# Patient Record
Sex: Female | Born: 1945 | Race: White | Hispanic: No | Smoking: Never smoker
Health system: Southern US, Community
[De-identification: ages and names within clinical notes are randomized; demographics above are authoritative.]

## PROBLEM LIST (undated history)

## (undated) DIAGNOSIS — F419 Anxiety disorder, unspecified: Secondary | ICD-10-CM

## (undated) DIAGNOSIS — G4700 Insomnia, unspecified: Secondary | ICD-10-CM

## (undated) DIAGNOSIS — G629 Polyneuropathy, unspecified: Secondary | ICD-10-CM

## (undated) DIAGNOSIS — E079 Disorder of thyroid, unspecified: Secondary | ICD-10-CM

## (undated) DIAGNOSIS — G8929 Other chronic pain: Secondary | ICD-10-CM

## (undated) DIAGNOSIS — M549 Dorsalgia, unspecified: Secondary | ICD-10-CM

## (undated) DIAGNOSIS — H269 Unspecified cataract: Secondary | ICD-10-CM

## (undated) DIAGNOSIS — E119 Type 2 diabetes mellitus without complications: Secondary | ICD-10-CM

## (undated) DIAGNOSIS — I2510 Atherosclerotic heart disease of native coronary artery without angina pectoris: Secondary | ICD-10-CM

## (undated) DIAGNOSIS — B029 Zoster without complications: Secondary | ICD-10-CM

## (undated) DIAGNOSIS — I10 Essential (primary) hypertension: Secondary | ICD-10-CM

## (undated) DIAGNOSIS — E785 Hyperlipidemia, unspecified: Secondary | ICD-10-CM

## (undated) DIAGNOSIS — K259 Gastric ulcer, unspecified as acute or chronic, without hemorrhage or perforation: Secondary | ICD-10-CM

## (undated) DIAGNOSIS — K219 Gastro-esophageal reflux disease without esophagitis: Secondary | ICD-10-CM

## (undated) HISTORY — DX: Polyneuropathy, unspecified: G62.9

## (undated) HISTORY — DX: Other chronic pain: G89.29

## (undated) HISTORY — DX: Insomnia, unspecified: G47.00

## (undated) HISTORY — DX: Gastro-esophageal reflux disease without esophagitis: K21.9

## (undated) HISTORY — DX: Disorder of thyroid, unspecified: E07.9

## (undated) HISTORY — DX: Essential (primary) hypertension: I10

## (undated) HISTORY — DX: Zoster without complications: B02.9

## (undated) HISTORY — PX: TOTAL ABDOMINAL HYSTERECTOMY: SHX209

## (undated) HISTORY — DX: Gastric ulcer, unspecified as acute or chronic, without hemorrhage or perforation: K25.9

## (undated) HISTORY — DX: Hyperlipidemia, unspecified: E78.5

## (undated) HISTORY — DX: Dorsalgia, unspecified: M54.9

## (undated) HISTORY — DX: Anxiety disorder, unspecified: F41.9

## (undated) HISTORY — DX: Unspecified cataract: H26.9

## (undated) HISTORY — DX: Atherosclerotic heart disease of native coronary artery without angina pectoris: I25.10

## (undated) HISTORY — DX: Type 2 diabetes mellitus without complications: E11.9

---

## 2010-09-22 ENCOUNTER — Encounter: Payer: Self-pay | Admitting: Cardiology

## 2010-09-22 ENCOUNTER — Ambulatory Visit (INDEPENDENT_AMBULATORY_CARE_PROVIDER_SITE_OTHER): Payer: Medicare Other | Admitting: Cardiology

## 2010-09-22 DIAGNOSIS — I10 Essential (primary) hypertension: Secondary | ICD-10-CM

## 2010-09-22 DIAGNOSIS — E1159 Type 2 diabetes mellitus with other circulatory complications: Secondary | ICD-10-CM | POA: Insufficient documentation

## 2010-09-22 DIAGNOSIS — F419 Anxiety disorder, unspecified: Secondary | ICD-10-CM | POA: Insufficient documentation

## 2010-09-22 DIAGNOSIS — I152 Hypertension secondary to endocrine disorders: Secondary | ICD-10-CM | POA: Insufficient documentation

## 2010-09-22 DIAGNOSIS — I2510 Atherosclerotic heart disease of native coronary artery without angina pectoris: Secondary | ICD-10-CM

## 2010-09-22 NOTE — Progress Notes (Signed)
HPI The patient presents as a new patient. I do have some records. These are somewhat difficult to interpret as I see a catheterization on 4008 with a stent to the right coronary artery Taxus 2.5 x 16 mm. However, the catheterization report it looks like the preprocedure angiogram suggested only a 15% stenosis in the RCA. I do see 2008 stress test.  She is now relocating here. She has no acute cardiovascular symptoms. She does not describe chest pressure, neck or arm discomfort though she will occasionally have some sharp pains and infrequently takes a nitroglycerin. This seems to be at night. It has been a chronic pattern. She is mostly limited by joint pains. She does have some episodes of shortness of breath and uses a nebulizer and MDI. She feels weak and she feels tired. Her biggest complaint has been anxiety and son are related to the stress of caring for her elderly mother. She is not describing PND or orthopnea.  No Known Allergies  Current Outpatient Prescriptions  Medication Sig Dispense Refill  . acetaminophen (TYLENOL) 325 MG tablet Take 650 mg by mouth every 6 (six) hours as needed.        Marland Kitchen. aspirin 81 MG tablet Take 81 mg by mouth daily.        . budesonide-formoterol (SYMBICORT) 160-4.5 MCG/ACT inhaler Inhale 2 puffs into the lungs 2 (two) times daily.        . cloNIDine (CATAPRES) 0.1 MG tablet Take 0.1 mg by mouth 2 (two) times daily.        Marland Kitchen. diltiazem (MATZIM LA) 360 MG 24 hr tablet Take 360 mg by mouth daily.        Marland Kitchen. glipiZIDE (GLUCOTROL) 5 MG tablet Take 5 mg by mouth 2 (two) times daily before a meal.        . hydrochlorothiazide 25 MG tablet Take 25 mg by mouth daily.        Maximino Greenland. IPRATROPIUM-ALBUTEROL IN Inhale into the lungs once a week.        . isosorbide mononitrate (IMDUR) 30 MG 24 hr tablet Take 30 mg by mouth daily.        Marland Kitchen. levothyroxine (SYNTHROID, LEVOTHROID) 100 MCG tablet Take 100 mcg by mouth daily.        . metFORMIN (GLUCOPHAGE) 1000 MG tablet Take 1,000 mg by  mouth. 1 and 1/2 tab po bid       . misoprostol (CYTOTEC) 200 MCG tablet Take 200 mcg by mouth 2 (two) times daily.        . niacin (NIASPAN) 1000 MG CR tablet Take 1,000 mg by mouth at bedtime.        . nitroGLYCERIN (NITROLINGUAL) 0.4 MG/SPRAY spray Place 1 spray under the tongue every 5 (five) minutes as needed.        . Omega-3 Fatty Acids (FISH OIL) 1000 MG CAPS Take by mouth daily.        Marland Kitchen. omeprazole (PRILOSEC) 20 MG capsule Take 20 mg by mouth daily.        . potassium chloride (K-DUR,KLOR-CON) 10 MEQ tablet Take 20 mEq by mouth daily.         Past Medical History  Diagnosis Date  . Asthma   . Thyroid disease   . Neuropathy   . Hypertension   . Chronic back pain   . Shingles   . GERD (gastroesophageal reflux disease)   . Anxiety     depression  . Migraine   . Insomnia   .  Coronary artery disease     Taxus stent to RCA 2008 2.5 x 16, non obstructive 15% proximal right coronary artery, patent curcumflex LAD, preserved LV    Past Surgical History  Procedure Date  . Total abdominal hysterectomy     Family History  Problem Relation Age of Onset  . Stroke Mother 27  . Coronary artery disease Brother 29  . Coronary artery disease Brother 54    History   Social History  . Marital Status: Married    Spouse Name: N/A    Number of Children: N/A  . Years of Education: N/A   Occupational History  . None    Social History Main Topics  . Smoking status: Never Smoker   . Smokeless tobacco: Not on file  . Alcohol Use: Not on file  . Drug Use: Not on file  . Sexually Active: Not on file   Other Topics Concern  . Not on file   Social History Narrative   Lives with sister taking care of her mother.  Lived in [place] but moving here.    ROS:  As stated in the HPI and negative for all other systems.  PHYSICAL EXAM BP 132/68  Pulse 99  Resp 16  Ht  (1.702 m)  Wt 189 lb (85.73 kg)  BMI 29.60 kg/m2 GENERAL:  Well appearing HEENT:  Pupils equal round and  reactive, fundi not visualized, oral mucosa unremarkable, edentulous NECK:  No jugular venous distention, waveform within normal limits, carotid upstroke brisk and symmetric, no bruits, no thyromegaly LYMPHATICS:  No cervical, inguinal adenopathy LUNGS:  Clear to auscultation bilaterally BACK:  No CVA tenderness CHEST:  Unremarkable HEART:  PMI not displaced or sustained,S1 and S2 within normal limits, no S3, no S4, no clicks, no rubs, no murmurs ABD:  Flat, positive bowel sounds normal in frequency in pitch, no bruits, no rebound, no guarding, no midline pulsatile mass, no hepatomegaly, no splenomegaly EXT:  2 plus pulses throughout, no edema, no cyanosis no clubbing, varicose veins SKIN:  No rashes no nodules NEURO:  Cranial nerves II through XII grossly intact, motor grossly intact throughout PSYCH:  Cognitively intact, oriented to person place and time  ASSESSMENT AND PLAN

## 2010-09-22 NOTE — Assessment & Plan Note (Signed)
At this point I don't suspect any anginal symptoms. No further cardiovascular testing will be planned. She will continue with risk reduction.

## 2010-09-22 NOTE — Patient Instructions (Signed)
Follow up in 1 year with Dr Hochrein.  You will receive a letter in the mail 2 months before you are due.  Please call us when you receive this letter to schedule your follow up appointment.  The current medical regimen is effective;  continue present plan and medications.  

## 2010-09-22 NOTE — Assessment & Plan Note (Signed)
The blood pressure is at target. No change in medications is indicated. We will continue with therapeutic lifestyle changes (TLC).  

## 2010-09-22 NOTE — Assessment & Plan Note (Signed)
Unfortunately this is a significant complaint the patient. She would like to have a very limited prescription of anxiolytics.  However, I will defer to Dr. Modesto Charon.  I have asked the patient to call him to discuss this.  It sounds like a reasonable request but I will defer to Dr. Nash Dimmer judgement.

## 2010-12-24 ENCOUNTER — Encounter: Payer: Self-pay | Admitting: Cardiology

## 2010-12-27 ENCOUNTER — Ambulatory Visit: Payer: Medicare Other | Admitting: Nurse Practitioner

## 2011-01-26 ENCOUNTER — Ambulatory Visit (INDEPENDENT_AMBULATORY_CARE_PROVIDER_SITE_OTHER): Payer: Medicare Other | Admitting: Cardiology

## 2011-01-26 ENCOUNTER — Encounter: Payer: Self-pay | Admitting: Cardiology

## 2011-01-26 DIAGNOSIS — E785 Hyperlipidemia, unspecified: Secondary | ICD-10-CM | POA: Insufficient documentation

## 2011-01-26 DIAGNOSIS — F419 Anxiety disorder, unspecified: Secondary | ICD-10-CM

## 2011-01-26 DIAGNOSIS — I10 Essential (primary) hypertension: Secondary | ICD-10-CM

## 2011-01-26 DIAGNOSIS — E1169 Type 2 diabetes mellitus with other specified complication: Secondary | ICD-10-CM | POA: Insufficient documentation

## 2011-01-26 DIAGNOSIS — F411 Generalized anxiety disorder: Secondary | ICD-10-CM

## 2011-01-26 DIAGNOSIS — I2510 Atherosclerotic heart disease of native coronary artery without angina pectoris: Secondary | ICD-10-CM

## 2011-01-26 NOTE — Assessment & Plan Note (Signed)
Given her new chest pain and past history stress testing is indicated. She would not be able to walk on a treadmill so she will have a [employer].

## 2011-01-26 NOTE — Assessment & Plan Note (Signed)
The blood pressure is at target. No change in medications is indicated. We will continue with therapeutic lifestyle changes (TLC).  

## 2011-01-26 NOTE — Assessment & Plan Note (Signed)
In October her LDL was 99 with an HDL of 66. She will continue the meds as listed.

## 2011-01-26 NOTE — Progress Notes (Signed)
HPI The patient presents for follow up of CAD.  The last appt was her first appt with me.  At that time she was doing well. However, she has started to have some chest pain. These are sporadic. They happen at rest. She's not sure there like any previous chest pains. She might take a nitroglycerin but she hasn't done this for about a week. She doesn't describe associated symptoms such as nausea vomiting or diaphoresis. She's not noticing any palpitations, presyncope or syncope. They are mild in intensity. They may last for seconds to a minute. She doesn't describe any radiation to her arms or neck.  She denies any new shortness of breath, PND or orthopnea.  No Known Allergies  Current Outpatient Prescriptions  Medication Sig Dispense Refill  . acetaminophen (TYLENOL) 325 MG tablet Take 650 mg by mouth every 6 (six) hours as needed.        Marland Kitchen. aspirin 325 MG tablet Take 325 mg by mouth daily.        . budesonide-formoterol (SYMBICORT) 160-4.5 MCG/ACT inhaler Inhale 2 puffs into the lungs 2 (two) times daily.        . cholecalciferol (VITAMIN D) 400 UNITS TABS Take by mouth. VITAMIN D3 daily       . cloNIDine (CATAPRES) 0.1 MG tablet Take 0.1 mg by mouth 2 (two) times daily.        Marland Kitchen. diltiazem (MATZIM LA) 360 MG 24 hr tablet Take 360 mg by mouth daily.        Marland Kitchen. glipiZIDE (GLUCOTROL) 5 MG tablet Take 5 mg by mouth 2 (two) times daily before a meal.        . hydrochlorothiazide 25 MG tablet Take 25 mg by mouth daily.        Maximino Greenland. IPRATROPIUM-ALBUTEROL IN Inhale into the lungs once a week.        . isosorbide mononitrate (IMDUR) 30 MG 24 hr tablet Take 30 mg by mouth daily.        Marland Kitchen. KLOR-CON 10 10 MEQ CR tablet 1 tab daily      . levothyroxine (SYNTHROID, LEVOTHROID) 100 MCG tablet Take 100 mcg by mouth daily.        . metFORMIN (GLUCOPHAGE) 1000 MG tablet Take 1,000 mg by mouth. 1 and 1/2 tab po bid       . misoprostol (CYTOTEC) 200 MCG tablet 1 tab twice a day      . niacin (NIASPAN) 1000 MG CR tablet  Take 1,000 mg by mouth at bedtime.        . nitroGLYCERIN (NITROLINGUAL) 0.4 MG/SPRAY spray Place 1 spray under the tongue every 5 (five) minutes as needed.        . Omega-3 Fatty Acids (FISH OIL) 1000 MG CAPS Take by mouth daily.        Marland Kitchen. omeprazole (PRILOSEC) 20 MG capsule Take 20 mg by mouth daily.        . potassium chloride (K-DUR,KLOR-CON) 10 MEQ tablet Take 20 mEq by mouth daily.       Marland Kitchen. ZETIA 10 MG tablet 1 tab daily        Past Medical History  Diagnosis Date  . Asthma   . Thyroid disease   . Neuropathy   . Hypertension   . Chronic back pain   . Shingles   . GERD (gastroesophageal reflux disease)   . Anxiety     depression  . Migraine   . Insomnia   . Coronary artery disease  Taxus stent to RCA 2008 2.5 x 16, non obstructive 15% proximal right coronary artery, patent curcumflex LAD, preserved LV    ROS:  Insomnia, back pain. Otherwise as stated in the HPI and negative for all other systems.  PHYSICAL EXAM BP 128/74  Pulse 84  Ht 5\' 7"  (1.702 m)  Wt 195 lb (88.451 kg)  BMI 30.54 kg/m2 GENERAL:  Well appearing HEENT:  Pupils equal round and reactive, fundi not visualized, oral mucosa unremarkable, edentulous NECK:  No jugular venous distention, waveform within normal limits, carotid upstroke brisk and symmetric, no bruits, no thyromegaly LYMPHATICS:  No cervical, inguinal adenopathy LUNGS:  Clear to auscultation bilaterally BACK:  No CVA tenderness CHEST:  Unremarkable HEART:  PMI not displaced or sustained,S1 and S2 within normal limits, no S3, no S4, no clicks, no rubs, no murmurs ABD:  Flat, positive bowel sounds normal in frequency in pitch, no bruits, no rebound, no guarding, no midline pulsatile mass, no hepatomegaly, no splenomegaly EXT:  2 plus pulses throughout, no edema, no cyanosis no clubbing, varicose veins SKIN:  No rashes no nodules NEURO:  Cranial nerves II through XII grossly intact, motor grossly intact throughout PSYCH:  Cognitively intact,  oriented to person place and time  EKG:  Sinus rhythm, rate 84, axis within normal limits, intervals within normal limits, no acute ST-T wave changes.  ASSESSMENT AND PLAN

## 2011-01-26 NOTE — Assessment & Plan Note (Signed)
I will defer to Dr. Orvan July.

## 2011-02-02 DIAGNOSIS — R079 Chest pain, unspecified: Secondary | ICD-10-CM

## 2011-02-03 ENCOUNTER — Encounter: Payer: Self-pay | Admitting: Cardiology

## 2011-03-23 ENCOUNTER — Ambulatory Visit (INDEPENDENT_AMBULATORY_CARE_PROVIDER_SITE_OTHER): Payer: Medicare Other | Admitting: Cardiology

## 2011-03-23 ENCOUNTER — Encounter: Payer: Self-pay | Admitting: Cardiology

## 2011-03-23 DIAGNOSIS — I10 Essential (primary) hypertension: Secondary | ICD-10-CM

## 2011-03-23 DIAGNOSIS — E785 Hyperlipidemia, unspecified: Secondary | ICD-10-CM

## 2011-03-23 DIAGNOSIS — I2510 Atherosclerotic heart disease of native coronary artery without angina pectoris: Secondary | ICD-10-CM

## 2011-03-23 MED ORDER — ISOSORBIDE MONONITRATE ER 60 MG PO TB24: 60.0000 mg | ORAL_TABLET | Freq: Every day | ORAL | Status: DC

## 2011-03-23 NOTE — Progress Notes (Signed)
HPI The patient presents for follow up of CAD.  At her first appt with me she described some chest pain.  We sent her for stress perfusion study which demonstrated an EF of 71% and questionable inferior defect with questionable artifact vs infarct with mild periinfarct ischemia.  Since that time she has had no change in her symptoms.  She does get some discomfort that is moderate.  It might last for 20 minutes.  She cannot bring it on with activity.  She does activities such as wash clothes without bringing this on.  She does not describe associated symptoms and she thinks that this is a stable pattern.  She denies any new SOB, PND or orthopnea.    No Known Allergies  Current Outpatient Prescriptions  Medication Sig Dispense Refill  . acetaminophen (TYLENOL) 325 MG tablet Take 650 mg by mouth every 6 (six) hours as needed.        Marland Kitchen aspirin 325 MG tablet Take 325 mg by mouth daily.        . budesonide-formoterol (SYMBICORT) 160-4.5 MCG/ACT inhaler Inhale 2 puffs into the lungs 2 (two) times daily.        . Chlorpheniramine Maleate (ALLERGY PO) Take by mouth as needed.      . cholecalciferol (VITAMIN D) 400 UNITS TABS Take by mouth. VITAMIN D3 daily       . cloNIDine (CATAPRES) 0.1 MG tablet Take 0.1 mg by mouth 2 (two) times daily.        Marland Kitchen diltiazem (MATZIM LA) 360 MG 24 hr tablet Take 360 mg by mouth daily.        Marland Kitchen glipiZIDE (GLUCOTROL) 5 MG tablet Take 5 mg by mouth 2 (two) times daily before a meal. And 1 po qhs      . hydrochlorothiazide 25 MG tablet Take 25 mg by mouth daily.        Maximino Greenland IN Inhale into the lungs once a week.        . isosorbide mononitrate (IMDUR) 30 MG 24 hr tablet Take 30 mg by mouth daily.        Marland Kitchen KLOR-CON 10 10 MEQ CR tablet 1 tab daily      . levothyroxine (SYNTHROID, LEVOTHROID) 125 MCG tablet Take 125 mcg by mouth daily.      . metFORMIN (GLUCOPHAGE) 1000 MG tablet Take 1,000 mg by mouth. 1 and 1/2 tab po bid       . misoprostol (CYTOTEC) 200  MCG tablet 1 tab twice a day      . niacin (NIASPAN) 1000 MG CR tablet Take 2,000 mg by mouth at bedtime.       . nitroGLYCERIN (NITROLINGUAL) 0.4 MG/SPRAY spray Place 1 spray under the tongue every 5 (five) minutes as needed.        . Omega-3 Fatty Acids (FISH OIL) 1000 MG CAPS Take by mouth daily.        Marland Kitchen omeprazole (PRILOSEC) 20 MG capsule Take 20 mg by mouth daily.        . potassium chloride (K-DUR,KLOR-CON) 10 MEQ tablet Take 10 mEq by mouth daily.       Marland Kitchen ZETIA 10 MG tablet 1 tab daily        Past Medical History  Diagnosis Date  . Asthma   . Thyroid disease   . Neuropathy   . Hypertension   . Chronic back pain   . Shingles   . GERD (gastroesophageal reflux disease)   . Anxiety  depression  . Migraine   . Insomnia   . Coronary artery disease     Taxus stent to RCA 2008 2.5 x 16, non obstructive 15% proximal right coronary artery, patent curcumflex LAD, preserved LV    ROS:  Insomnia, back pain. Otherwise as stated in the HPI and negative for all other systems.  PHYSICAL EXAM BP 126/86  Pulse 100  Resp 16  Ht 5\' 7"  (1.702 m)  Wt 190 lb (86.183 kg)  BMI 29.76 kg/m2 GENERAL:  Well appearing HEENT:  Pupils equal round and reactive, fundi not visualized, oral mucosa unremarkable, edentulous NECK:  No jugular venous distention, waveform within normal limits, carotid upstroke brisk and symmetric, no bruits, no thyromegaly LYMPHATICS:  No cervical, inguinal adenopathy LUNGS:  Clear to auscultation bilaterally BACK:  No CVA tenderness CHEST:  Unremarkable HEART:  PMI not displaced or sustained,S1 and S2 within normal limits, no S3, no S4, no clicks, no rubs, no murmurs ABD:  Flat, positive bowel sounds normal in frequency in pitch, no bruits, no rebound, no guarding, no midline pulsatile mass, no hepatomegaly, no splenomegaly EXT:  2 plus pulses throughout, no edema, no cyanosis no clubbing, varicose veins SKIN:  No rashes no nodules NEURO:  Cranial nerves II through  XII grossly intact, motor grossly intact throughout PSYCH:  Cognitively intact, oriented to person place and time   ASSESSMENT AND PLAN

## 2011-03-23 NOTE — Assessment & Plan Note (Signed)
I reviewed her most recent lipids.  At this point no change in therapy is indicated.

## 2011-03-23 NOTE — Patient Instructions (Addendum)
Increase Isosorbide to 60 mg a day  Continue all other medications as listed   Follow up in 1 year with Dr Antoine Poche.  You will receive a letter in the mail 2 months before you are due.  Please call us when you receive this letter to schedule your follow up appointment.

## 2011-03-23 NOTE — Assessment & Plan Note (Signed)
This was a low risk study result.  Her symptoms are somewhat atypical and stable.  I will increase her Imdur to 60 mg daily.  For now I don't think that invasive study (cath is indicated). I might suggest GI referral if her symptoms continue.

## 2011-03-23 NOTE — Assessment & Plan Note (Signed)
The blood pressure is at target. No change in medications is indicated. We will continue with therapeutic lifestyle changes (TLC).  

## 2011-09-12 ENCOUNTER — Other Ambulatory Visit: Payer: Self-pay | Admitting: Family Medicine

## 2011-09-12 DIAGNOSIS — M545 Low back pain, unspecified: Secondary | ICD-10-CM

## 2011-09-14 ENCOUNTER — Other Ambulatory Visit: Payer: Medicare Other

## 2012-05-08 ENCOUNTER — Other Ambulatory Visit: Payer: Self-pay | Admitting: *Deleted

## 2012-05-08 DIAGNOSIS — Z780 Asymptomatic menopausal state: Secondary | ICD-10-CM

## 2012-05-21 ENCOUNTER — Encounter: Payer: Self-pay | Admitting: Family Medicine

## 2012-05-21 ENCOUNTER — Ambulatory Visit (INDEPENDENT_AMBULATORY_CARE_PROVIDER_SITE_OTHER): Payer: Medicare Other | Admitting: Family Medicine

## 2012-05-21 VITALS — BP 170/91 | HR 66 | Temp 98.5°F | Ht 66.0 in | Wt 186.6 lb

## 2012-05-21 DIAGNOSIS — E669 Obesity, unspecified: Secondary | ICD-10-CM

## 2012-05-21 DIAGNOSIS — E039 Hypothyroidism, unspecified: Secondary | ICD-10-CM

## 2012-05-21 DIAGNOSIS — I10 Essential (primary) hypertension: Secondary | ICD-10-CM

## 2012-05-21 DIAGNOSIS — F419 Anxiety disorder, unspecified: Secondary | ICD-10-CM

## 2012-05-21 DIAGNOSIS — J449 Chronic obstructive pulmonary disease, unspecified: Secondary | ICD-10-CM | POA: Insufficient documentation

## 2012-05-21 DIAGNOSIS — E785 Hyperlipidemia, unspecified: Secondary | ICD-10-CM

## 2012-05-21 DIAGNOSIS — J4489 Other specified chronic obstructive pulmonary disease: Secondary | ICD-10-CM

## 2012-05-21 DIAGNOSIS — J45909 Unspecified asthma, uncomplicated: Secondary | ICD-10-CM | POA: Insufficient documentation

## 2012-05-21 DIAGNOSIS — K219 Gastro-esophageal reflux disease without esophagitis: Secondary | ICD-10-CM

## 2012-05-21 DIAGNOSIS — F411 Generalized anxiety disorder: Secondary | ICD-10-CM

## 2012-05-21 DIAGNOSIS — I2510 Atherosclerotic heart disease of native coronary artery without angina pectoris: Secondary | ICD-10-CM

## 2012-05-21 DIAGNOSIS — E119 Type 2 diabetes mellitus without complications: Secondary | ICD-10-CM

## 2012-05-21 MED ORDER — MISOPROSTOL 200 MCG PO TABS: 200.0000 ug | ORAL_TABLET | Freq: Two times a day (BID) | ORAL | Status: DC

## 2012-05-21 NOTE — Progress Notes (Signed)
Subjective:     Patient ID: Denise Gardner, female   DOB: 07-17-1945, 67 y.o.   MRN: [id number]  HPI Type II or unspecified type diabetes mellitus without mention of complication, not stated as uncontrolled - Plan: POCT glycosylated hemoglobin (Hb A1C), POCT UA - Microalbumin  Unspecified asthma  Hypertension  Anxiety  Coronary artery disease  Hyperlipidemia - Plan: COMPLETE METABOLIC PANEL WITH GFR, NMR Lipoprofile with Lipids  COPD (chronic obstructive pulmonary disease)  GERD (gastroesophageal reflux disease)  Obesity, unspecified  Unspecified hypothyroidism - Plan: Thyroid Panel With TSH   Her sugars are running 150s. Occasional blurred vision. Last exam of her eyes has been a long time. She was set up an eye exam appointment with her eye doctor. Asthma has been stable. Was given her are tender and cold winter. But it's much better and. Her blood pressure has always been stable she is quite surprised that his bit high today. She was getting very impatient because of the long wait due to the data entry avoid new computer system. In view of her coronary disease there's been no chest pain shortness of breath palpitations or pedal edema. In view of her COPD she has not had any wheezing. Her GERD has been stable. Her obesity she has lost a couple pounds but that's as much as it is. She is trying to make lifestyle changes in her diet. But it's very hard and she doesn't exercise as much because of the cold weather.  Past Medical History  Diagnosis Date  . Asthma   . Thyroid disease   . Neuropathy   . Hypertension   . Chronic back pain   . Shingles   . GERD (gastroesophageal reflux disease)   . Anxiety     depression  . Migraine   . Insomnia   . Coronary artery disease     Taxus stent to RCA 2008 2.5 x 16, non obstructive 15% proximal right coronary artery, patent curcumflex LAD, preserved LV   Past Surgical History  Procedure Laterality Date  . Total abdominal  hysterectomy     History   Social History  . Marital Status: Married    Spouse Name: N/A    Number of Children: N/A  . Years of Education: N/A   Occupational History  . retired     Medical laboratory scientific officer    Social History Main Topics  . Smoking status: Never Smoker   . Smokeless tobacco: Not on file  . Alcohol Use: No  . Drug Use: No  . Sexually Active: Not on file   Other Topics Concern  . Not on file   Social History Narrative   Lives with sister taking care of her mother.  Lives here   Family History  Problem Relation Age of Onset  . Stroke Mother 45  . Coronary artery disease Brother 75  . Coronary artery disease Brother 50   Current Outpatient Prescriptions on File Prior to Visit  Medication Sig Dispense Refill  . acetaminophen (TYLENOL) 325 MG tablet Take 650 mg by mouth every 6 (six) hours as needed.        Marland Kitchen aspirin 325 MG tablet Take 325 mg by mouth daily.        . cholecalciferol (VITAMIN D) 400 UNITS TABS Take by mouth. VITAMIN D3 daily       . cloNIDine (CATAPRES) 0.1 MG tablet Take 0.1 mg by mouth 2 (two) times daily.        Marland Kitchen diltiazem (MATZIM LA) 360 MG  24 hr tablet Take 360 mg by mouth daily.        Marland Kitchen glipiZIDE (GLUCOTROL) 5 MG tablet Take 5 mg by mouth 2 (two) times daily before a meal. And 1 po qhs      . hydrochlorothiazide 25 MG tablet Take 25 mg by mouth daily.        Maximino Greenland IN Inhale into the lungs once a week.        . isosorbide mononitrate (IMDUR) 60 MG 24 hr tablet Take 1 tablet (60 mg total) by mouth daily.  90 tablet  3  . KLOR-CON 10 10 MEQ CR tablet 1 tab daily      . levothyroxine (SYNTHROID, LEVOTHROID) 125 MCG tablet Take 125 mcg by mouth daily.      . metFORMIN (GLUCOPHAGE) 1000 MG tablet Take 1,000 mg by mouth. 1 and 1/2 tab po bid       . niacin (NIASPAN) 1000 MG CR tablet Take 2,000 mg by mouth at bedtime.       . Omega-3 Fatty Acids (FISH OIL) 1000 MG CAPS Take by mouth daily.        Marland Kitchen omeprazole (PRILOSEC) 20 MG capsule  Take 20 mg by mouth daily.        . potassium chloride (K-DUR,KLOR-CON) 10 MEQ tablet Take 10 mEq by mouth daily.       . budesonide-formoterol (SYMBICORT) 160-4.5 MCG/ACT inhaler Inhale 2 puffs into the lungs 2 (two) times daily.        . Chlorpheniramine Maleate (ALLERGY PO) Take by mouth as needed.      . nitroGLYCERIN (NITROLINGUAL) 0.4 MG/SPRAY spray Place 1 spray under the tongue every 5 (five) minutes as needed.        Marland Kitchen ZETIA 10 MG tablet 1 tab daily       No current facility-administered medications on file prior to visit.   Allergies  Allergen Reactions  . Zetia (Ezetimibe)     mylagia   Immunization History  Administered Date(s) Administered  . Pneumococcal Polysaccharide 10/22/2010   Prior to Admission medications   Medication Sig Start Date End Date Taking? Authorizing Provider  ACCU-CHEK AVIVA PLUS test strip  04/02/12  Yes Historical Provider, MD  ACCU-CHEK FASTCLIX LANCETS MISC  04/02/12  Yes Historical Provider, MD  acetaminophen (TYLENOL) 325 MG tablet Take 650 mg by mouth every 6 (six) hours as needed.     Yes Historical Provider, MD  aspirin 325 MG tablet Take 325 mg by mouth daily.     Yes Historical Provider, MD  cholecalciferol (VITAMIN D) 400 UNITS TABS Take by mouth. VITAMIN D3 daily    Yes Historical Provider, MD  cloNIDine (CATAPRES) 0.1 MG tablet Take 0.1 mg by mouth 2 (two) times daily.     Yes Historical Provider, MD  diltiazem (MATZIM LA) 360 MG 24 hr tablet Take 360 mg by mouth daily.     Yes Historical Provider, MD  fenofibrate 54 MG tablet  05/15/12  Yes Historical Provider, MD  glipiZIDE (GLUCOTROL) 5 MG tablet Take 5 mg by mouth 2 (two) times daily before a meal. And 1 po qhs   Yes Historical Provider, MD  hydrochlorothiazide 25 MG tablet Take 25 mg by mouth daily.     Yes Historical Provider, MD  IPRATROPIUM-ALBUTEROL IN Inhale into the lungs once a week.     Yes Historical Provider, MD  isosorbide mononitrate (IMDUR) 60 MG 24 hr tablet Take 1 tablet (60  mg total) by mouth daily. 03/23/11  Yes Rollene Rotunda, MD  JANUVIA 100 MG tablet  03/06/12  Yes Historical Provider, MD  KLOR-CON 10 10 MEQ CR tablet 1 tab daily 01/05/11  Yes Historical Provider, MD  levothyroxine (SYNTHROID, LEVOTHROID) 125 MCG tablet Take 125 mcg by mouth daily.   Yes Historical Provider, MD  metFORMIN (GLUCOPHAGE) 1000 MG tablet Take 1,000 mg by mouth. 1 and 1/2 tab po bid    Yes Historical Provider, MD  misoprostol (CYTOTEC) 200 MCG tablet Take 1 tablet (200 mcg total) by mouth 2 (two) times daily. 1 tab twice a day 05/21/12  Yes Ileana Ladd, MD  niacin (NIASPAN) 1000 MG CR tablet Take 2,000 mg by mouth at bedtime.    Yes Historical Provider, MD  Omega-3 Fatty Acids (FISH OIL) 1000 MG CAPS Take by mouth daily.     Yes Historical Provider, MD  omeprazole (PRILOSEC) 20 MG capsule Take 20 mg by mouth daily.     Yes Historical Provider, MD  potassium chloride (K-DUR,KLOR-CON) 10 MEQ tablet Take 10 mEq by mouth daily.    Yes Historical Provider, MD  budesonide-formoterol (SYMBICORT) 160-4.5 MCG/ACT inhaler Inhale 2 puffs into the lungs 2 (two) times daily.      Historical Provider, MD  Chlorpheniramine Maleate (ALLERGY PO) Take by mouth as needed.    Historical Provider, MD  nitroGLYCERIN (NITROLINGUAL) 0.4 MG/SPRAY spray Place 1 spray under the tongue every 5 (five) minutes as needed.      Historical Provider, MD  ZETIA 10 MG tablet 1 tab daily 01/26/11   Historical Provider, MD    Review of Systems Review of other symptomatologies in the review of system was negative    Objective:   Physical Exam On examination she appeared in good health and spirits. Well developed, well nourished. Overweight actually obese Vital signs as documented. BP 170/91  Pulse 66  Temp(Src) 98.5 F (36.9 C) (Oral)  Ht 5\' 6"  (1.676 m)  Wt 186 lb 9.6 oz (84.641 kg)  BMI 30.13 kg/m2  Skin warm and dry and without overt rashes. There is a tiny pinhead wart on the heel of the left foot Head & Neck  without JVD. Lungs clear.  Heart exam notable for regular rhythm, normal sounds and absence of murmurs, rubs or gallops. Abdomen unremarkable and without evidence of organomegaly, masses, or abdominal aortic enlargement.  Breast exam: not performed. Gyn Exam: Not performed. External Genitalia: Vagina:: Cervix: Uterus: Adnexae: R/V: Extremities nonedematous.    Assessment:     Type II or unspecified type diabetes mellitus without mention of complication, not stated as uncontrolled  Unspecified asthma  Hypertension  Anxiety  Coronary artery disease  Hyperlipidemia  COPD (chronic obstructive pulmonary disease)  GERD (gastroesophageal reflux disease)  Obesity, unspecified  Left foot plantar wart. Reviewed her diagnosis with her and the changes needed to improve her health status i.e. diet and exercise    Plan:                                      Medications prescribed  Diet and Exercise discussed with patient. For nutrition information, I recommended books: Eat to Live by Dr Monico Hoar. Prevent and Reverse Heart Disease by Dr Suzzette Righter.  Exercise recommendations are:  If unable to walk, then the patient can exercise in a chair 3 times a day. By flapping arms like a bird gently and raising legs outwards to the front.  If  ambulatory, the patient can go for walks for 30 minutes 3 times a week. Then increase the intensity and duration as tolerated. Goal is to try to attain exercise frequency to 5 times a week. Best to perform resistance exercises 2 days a week and cardio type exercises 3 days per week.   Meds reviewed and labs ordered for the near future.  Edgerrin Correia P. Modesto Charon, M.D.

## 2012-05-30 ENCOUNTER — Ambulatory Visit (INDEPENDENT_AMBULATORY_CARE_PROVIDER_SITE_OTHER): Payer: Medicare Other | Admitting: Pharmacist

## 2012-05-30 ENCOUNTER — Other Ambulatory Visit: Payer: Medicare Other

## 2012-05-30 ENCOUNTER — Ambulatory Visit (INDEPENDENT_AMBULATORY_CARE_PROVIDER_SITE_OTHER): Payer: Medicare Other

## 2012-05-30 ENCOUNTER — Telehealth: Payer: Self-pay | Admitting: Pharmacist

## 2012-05-30 ENCOUNTER — Other Ambulatory Visit: Payer: Self-pay | Admitting: *Deleted

## 2012-05-30 ENCOUNTER — Encounter: Payer: Self-pay | Admitting: Pharmacist

## 2012-05-30 ENCOUNTER — Ambulatory Visit: Payer: Medicare Other | Admitting: *Deleted

## 2012-05-30 VITALS — BP 144/77 | Ht 65.0 in | Wt 184.0 lb

## 2012-05-30 VITALS — BP 144/77 | HR 99

## 2012-05-30 DIAGNOSIS — E785 Hyperlipidemia, unspecified: Secondary | ICD-10-CM

## 2012-05-30 DIAGNOSIS — I10 Essential (primary) hypertension: Secondary | ICD-10-CM

## 2012-05-30 DIAGNOSIS — M8580 Other specified disorders of bone density and structure, unspecified site: Secondary | ICD-10-CM

## 2012-05-30 DIAGNOSIS — Z0130 Encounter for examination of blood pressure without abnormal findings: Secondary | ICD-10-CM

## 2012-05-30 DIAGNOSIS — M899 Disorder of bone, unspecified: Secondary | ICD-10-CM

## 2012-05-30 DIAGNOSIS — Z780 Asymptomatic menopausal state: Secondary | ICD-10-CM

## 2012-05-30 DIAGNOSIS — E039 Hypothyroidism, unspecified: Secondary | ICD-10-CM

## 2012-05-30 LAB — COMPREHENSIVE METABOLIC PANEL
ALT: 16 U/L (ref 0–35)
AST: 19 U/L (ref 0–37)
Albumin: 4.5 g/dL (ref 3.5–5.2)
Alkaline Phosphatase: 48 U/L (ref 39–117)
BUN: 15 mg/dL (ref 6–23)
CO2: 27 mEq/L (ref 19–32)
Calcium: 10.7 mg/dL — ABNORMAL HIGH (ref 8.4–10.5)
Chloride: 96 mEq/L (ref 96–112)
Creat: 0.94 mg/dL (ref 0.50–1.10)
Glucose, Bld: 145 mg/dL — ABNORMAL HIGH (ref 70–99)
Potassium: 3.6 mEq/L (ref 3.5–5.3)
Sodium: 138 mEq/L (ref 135–145)
Total Bilirubin: 0.5 mg/dL (ref 0.3–1.2)
Total Protein: 7.4 g/dL (ref 6.0–8.3)

## 2012-05-30 LAB — THYROID PANEL WITH TSH
Free Thyroxine Index: 4.2 — ABNORMAL HIGH (ref 1.0–3.9)
T3 Uptake: 46.5 % — ABNORMAL HIGH (ref 22.5–37.0)
T4, Total: 9.1 ug/dL (ref 5.0–12.5)
TSH: 2.054 u[IU]/mL (ref 0.350–4.500)

## 2012-05-30 MED ORDER — RALOXIFENE HCL 60 MG PO TABS: 60.0000 mg | ORAL_TABLET | Freq: Every day | ORAL | Status: DC

## 2012-05-30 NOTE — Telephone Encounter (Signed)
Please review patient's records and make recommendations as needed

## 2012-05-30 NOTE — Patient Instructions (Signed)

## 2012-05-30 NOTE — Telephone Encounter (Signed)
Needs office visit. To assess her insomnia

## 2012-05-30 NOTE — Progress Notes (Signed)
Patient ID: Keyia Moretto, female   DOB: 1945-10-01, 67 y.o.   MRN: [id number] [facility] Current Height: Height: 5\' 5"  (165.1 cm)      Max Lifetime Height:  5'7.5" Current Weight: Weight: 184 lb (83.462 kg)       BP: BP: 144/77 mmHg        HPI: Does pt already have a diagnosis of:  Osteopenia?  No Osteoporosis?  No  Back Pain?  Yes       Kyphosis?  Yes Prior fracture?  No Med(s) for Osteoporosis/Osteopenia:  Vitamin D qd Med(s) previously tried for Osteoporosis/Osteopenia:  none                                                             PMH: Age at menopause:  67yo (surgical) Hysterectomy?  Yes Oophorectomy?  Yes HRT? Yes - Former.  Type/duration: pt cannot recall Steroid Use?  Yes - Current.  Type/duration: inhaled steroids for asthma Thyroid med?  Yes History of cancer? No History of digestive disorders (ie Crohn's)?  Yes - GERD Current or previous eating disorders?  No Last Vitamin D Result:  56 (01/2012) Last GFR Result:  72 (01/2012)   FH/SH: Family history of osteoporosis?  Yes -sister Parent with history of hip fracture?  No Family history of breast cancer?  Yes -sister Exercise?  No Caffeine?  Yes - 2-3 cups coffee per day Smoking?  No Alcohol?  No    Calcium Assessment  **per patient lactose intolerant** Calcium Intake  # of servings/day  Calcium mg  Milk (8 oz) 0  x  300  = 0  Yogurt (8 oz) 0 x  400 = 0  Cheese (1 oz) 0 x  200 = 0  Non dairy sources   250 mg  Ca supplement 0 = 0   Estimated calcium intake per day 250mg     DEXA Results Date of Test T-Score for AP Spine L1-L4 T-Score for Left Neck of Hip T-Score for Right Neck of Hip  05/30/2012 0.6 -2.1 -1.4                  FRAX 10 year estimate: Total FX risk:  12%  (consider medication if >/= 20%) Hip FX risk:  2.1%  (consider medication if >/= 3%)  Assessment: Osteopenia with family h/o osteoporosis   Recommendations: Start evista 60mg  po daily Increase calcium intake to 1200mg   daily through diet or calcium supplementation Weight bearing exercise - as able Educate on fall preventtion - counseling and educational materials provided Recheck DEXA:  2 years  Time spent counseling patient:  

## 2012-05-30 NOTE — Patient Instructions (Signed)
F/u as recommended

## 2012-05-30 NOTE — Progress Notes (Signed)
Rck BP per Dr. Modesto Charon

## 2012-05-31 LAB — NMR LIPOPROFILE WITH LIPIDS
Cholesterol, Total: 166 mg/dL (ref <200)
HDL Particle Number: 33.3 umol/L (ref >=30.5)
HDL Size: 10.2 nm (ref >=9.2)
HDL-C: 69 mg/dL (ref >=40)
LDL (calc): 88 mg/dL (ref <100)
LDL Particle Number: 650 nmol/L (ref <1000)
LDL Size: 20.9 nm (ref >20.5)
LP-IR Score: <25 (ref <=45)
Large HDL-P: 12.3 umol/L (ref >=4.8)
Large VLDL-P: 0.8 nmol/L (ref <=2.7)
Small LDL Particle Number: 174 nmol/L (ref <=527)
Triglycerides: 43 mg/dL (ref <150)
VLDL Size: 42 nm (ref <=46.6)

## 2012-05-31 NOTE — Progress Notes (Signed)
Quick Note:  Lab result at goal. No change in Medications for now. ______ 

## 2012-06-05 NOTE — Telephone Encounter (Signed)
Pt aware that needs ov and she stated will  call next week for ov

## 2012-06-13 ENCOUNTER — Other Ambulatory Visit: Payer: Self-pay

## 2012-06-13 MED ORDER — HYDROCHLOROTHIAZIDE 25 MG PO TABS: 25.0000 mg | ORAL_TABLET | Freq: Every day | ORAL | Status: DC

## 2012-06-13 MED ORDER — POTASSIUM CHLORIDE CRYS ER 10 MEQ PO TBCR: 10.0000 meq | EXTENDED_RELEASE_TABLET | Freq: Every day | ORAL | Status: DC

## 2012-06-19 ENCOUNTER — Other Ambulatory Visit: Payer: Self-pay

## 2012-06-19 MED ORDER — FENOFIBRATE 54 MG PO TABS: 54.0000 mg | ORAL_TABLET | Freq: Every day | ORAL | Status: DC

## 2012-06-30 ENCOUNTER — Other Ambulatory Visit: Payer: Self-pay | Admitting: Family Medicine

## 2012-07-25 ENCOUNTER — Other Ambulatory Visit: Payer: Self-pay | Admitting: Family Medicine

## 2012-08-07 ENCOUNTER — Other Ambulatory Visit: Payer: Self-pay | Admitting: Family Medicine

## 2012-08-12 ENCOUNTER — Other Ambulatory Visit: Payer: Self-pay | Admitting: Family Medicine

## 2012-09-05 ENCOUNTER — Ambulatory Visit (INDEPENDENT_AMBULATORY_CARE_PROVIDER_SITE_OTHER): Payer: Medicare Other | Admitting: Family Medicine

## 2012-09-05 ENCOUNTER — Ambulatory Visit (INDEPENDENT_AMBULATORY_CARE_PROVIDER_SITE_OTHER): Payer: Medicare Other

## 2012-09-05 ENCOUNTER — Encounter: Payer: Self-pay | Admitting: Family Medicine

## 2012-09-05 VITALS — BP 154/85 | HR 96 | Temp 97.1°F | Ht 65.0 in | Wt 182.0 lb

## 2012-09-05 DIAGNOSIS — M25579 Pain in unspecified ankle and joints of unspecified foot: Secondary | ICD-10-CM

## 2012-09-05 DIAGNOSIS — M19079 Primary osteoarthritis, unspecified ankle and foot: Secondary | ICD-10-CM

## 2012-09-05 DIAGNOSIS — E119 Type 2 diabetes mellitus without complications: Secondary | ICD-10-CM

## 2012-09-05 DIAGNOSIS — Z0000 Encounter for general adult medical examination without abnormal findings: Secondary | ICD-10-CM

## 2012-09-05 DIAGNOSIS — M25571 Pain in right ankle and joints of right foot: Secondary | ICD-10-CM

## 2012-09-05 DIAGNOSIS — Z780 Asymptomatic menopausal state: Secondary | ICD-10-CM

## 2012-09-05 DIAGNOSIS — N951 Menopausal and female climacteric states: Secondary | ICD-10-CM

## 2012-09-05 DIAGNOSIS — Z23 Encounter for immunization: Secondary | ICD-10-CM

## 2012-09-05 LAB — POCT GLYCOSYLATED HEMOGLOBIN (HGB A1C): Hemoglobin A1C: 6.8

## 2012-09-05 LAB — POCT UA - MICROALBUMIN: Microalbumin Ur, POC: 20 mg/L

## 2012-09-05 MED ORDER — RALOXIFENE HCL 60 MG PO TABS: 60.0000 mg | ORAL_TABLET | Freq: Every day | ORAL | Status: DC

## 2012-09-05 MED ORDER — MELOXICAM 7.5 MG PO TABS: 7.5000 mg | ORAL_TABLET | Freq: Every day | ORAL | Status: DC

## 2012-09-05 NOTE — Progress Notes (Signed)
Patient ID: Denise Gardner, female   DOB: [birth year], 67 y.o.   MRN: [id number] SUBJECTIVE: CC: Chief Complaint  Patient presents with  . Back Pain    Radicular pain into right leg and down through to right ankle.  Also c/o right ankle swelling.  . Follow-up    f/u on chronic medical conditions      HPI: Patient is here for follow up of Diabetes Mellitus: Symptoms of DM: Denies Nocturia ,Denies Urinary Frequency , denies Blurred vision ,deniesDizziness,denies.Dysuria,denies paresthesias, denies extremity pain or ulcers.Marland Kitchendenies chest pain. has had an annual eye exam. do check the feet. Does check CBGs. Average CBG:100 but fluctuates 1 episodes of hypoglycemia.went to 40s Does have an emergency hypoglycemic plan. admits toCompliance with medications. Denies Problems with medications.  Right ankle giving her a problem. Hurts at night after walking all day.  Past Medical History  Diagnosis Date  . Asthma   . Thyroid disease   . Neuropathy   . Hypertension   . Chronic back pain   . Shingles   . GERD (gastroesophageal reflux disease)   . Anxiety     depression  . Migraine   . Insomnia   . Coronary artery disease     Taxus stent to RCA 2008 2.5 x 16, non obstructive 15% proximal right coronary artery, patent curcumflex LAD, preserved LV   Past Surgical History  Procedure Laterality Date  . Total abdominal hysterectomy     History   Social History  . Marital Status: Married    Spouse Name: N/A    Number of Children: N/A  . Years of Education: N/A   Occupational History  . retired     Medical laboratory scientific officer    Social History Main Topics  . Smoking status: Never Smoker   . Smokeless tobacco: Not on file  . Alcohol Use: No  . Drug Use: No  . Sexually Active: Not on file   Other Topics Concern  . Not on file   Social History Narrative   Lives with sister taking care of her mother.  Lives here   Family History  Problem Relation Age of Onset  . Stroke Mother 59  . Coronary  artery disease Brother 1  . Coronary artery disease Brother 42   Current Outpatient Prescriptions on File Prior to Visit  Medication Sig Dispense Refill  . ACCU-CHEK AVIVA PLUS test strip       . ACCU-CHEK FASTCLIX LANCETS MISC       . acetaminophen (TYLENOL) 325 MG tablet Take 650 mg by mouth every 6 (six) hours as needed.        Marland Kitchen aspirin 325 MG tablet Take 325 mg by mouth daily.        . beclomethasone (QVAR) 40 MCG/ACT inhaler Inhale 2 puffs into the lungs 2 (two) times daily.      . Chlorpheniramine Maleate (ALLERGY PO) Take by mouth as needed.      . cholecalciferol (VITAMIN D) 400 UNITS TABS Take by mouth. VITAMIN D3 daily       . cloNIDine (CATAPRES) 0.1 MG tablet Take 0.1 mg by mouth 2 (two) times daily.        Marland Kitchen diltiazem (MATZIM LA) 360 MG 24 hr tablet Take 360 mg by mouth daily.        . fenofibrate 54 MG tablet Take 1 tablet (54 mg total) by mouth daily.  30 tablet  1  . glipiZIDE (GLUCOTROL) 5 MG tablet Take 5 mg by mouth 2 (two)  times daily before a meal. And 1 po qhs      . hydrochlorothiazide (HYDRODIURIL) 25 MG tablet TAKE 1 TABLET BY MOUTH DAILY  30 tablet  2  . IPRATROPIUM-ALBUTEROL IN Inhale into the lungs once a week.       . isosorbide mononitrate (IMDUR) 60 MG 24 hr tablet Take 1 tablet (60 mg total) by mouth daily.  90 tablet  3  . JANUVIA 100 MG tablet       . KLOR-CON 10 10 MEQ tablet TAKE 1 TABLET BY MOUTH DAILY  30 tablet  2  . levothyroxine (SYNTHROID, LEVOTHROID) 125 MCG tablet TAKE ONE TABLET BY MOUTH DAILY  90 tablet  2  . metFORMIN (GLUCOPHAGE) 1000 MG tablet TAKE 1 AND 1/2 TABLETS BY MOUTH TWICE DAILY  270 tablet  0  . misoprostol (CYTOTEC) 200 MCG tablet Take 1 tablet (200 mcg total) by mouth 2 (two) times daily. 1 tab twice a day  180 tablet  3  . niacin (NIASPAN) 1000 MG CR tablet Take 2,000 mg by mouth at bedtime.       . nitroGLYCERIN (NITROLINGUAL) 0.4 MG/SPRAY spray Place 1 spray under the tongue every 5 (five) minutes as needed.        . Omega-3  Fatty Acids (FISH OIL) 1000 MG CAPS Take by mouth daily.        Marland Kitchen omeprazole (PRILOSEC) 20 MG capsule Take 20 mg by mouth daily.        . raloxifene (EVISTA) 60 MG tablet Take one po qd  30 tablet  0  . [DISCONTINUED] potassium chloride (K-DUR,KLOR-CON) 10 MEQ tablet Take 1 tablet (10 mEq total) by mouth daily.  30 tablet  1   No current facility-administered medications on file prior to visit.   Allergies  Allergen Reactions  . Crestor (Rosuvastatin)     Myalgias   . Zetia (Ezetimibe)     mylagia   Immunization History  Administered Date(s) Administered  . Pneumococcal Polysaccharide 10/22/2010   Prior to Admission medications   Medication Sig Start Date End Date Taking? Authorizing Provider  ACCU-CHEK AVIVA PLUS test strip  04/02/12   Historical Provider, MD  ACCU-CHEK FASTCLIX LANCETS MISC  04/02/12   Historical Provider, MD  acetaminophen (TYLENOL) 325 MG tablet Take 650 mg by mouth every 6 (six) hours as needed.      Historical Provider, MD  aspirin 325 MG tablet Take 325 mg by mouth daily.      Historical Provider, MD  beclomethasone (QVAR) 40 MCG/ACT inhaler Inhale 2 puffs into the lungs 2 (two) times daily. 11/30/11   Historical Provider, MD  Chlorpheniramine Maleate (ALLERGY PO) Take by mouth as needed.    Historical Provider, MD  cholecalciferol (VITAMIN D) 400 UNITS TABS Take by mouth. VITAMIN D3 daily     Historical Provider, MD  cloNIDine (CATAPRES) 0.1 MG tablet Take 0.1 mg by mouth 2 (two) times daily.      Historical Provider, MD  diltiazem (MATZIM LA) 360 MG 24 hr tablet Take 360 mg by mouth daily.      Historical Provider, MD  fenofibrate 54 MG tablet Take 1 tablet (54 mg total) by mouth daily. 06/19/12   Ileana Ladd, MD  glipiZIDE (GLUCOTROL) 5 MG tablet Take 5 mg by mouth 2 (two) times daily before a meal. And 1 po qhs    Historical Provider, MD  hydrochlorothiazide (HYDRODIURIL) 25 MG tablet TAKE 1 TABLET BY MOUTH DAILY 08/07/12   Ileana Ladd, MD  IPRATROPIUM-ALBUTEROL IN Inhale into the lungs once a week.     Historical Provider, MD  isosorbide mononitrate (IMDUR) 60 MG 24 hr tablet Take 1 tablet (60 mg total) by mouth daily. 03/23/11   Rollene Rotunda, MD  JANUVIA 100 MG tablet  03/06/12   Historical Provider, MD  KLOR-CON 10 10 MEQ tablet TAKE 1 TABLET BY MOUTH DAILY 08/07/12   Ileana Ladd, MD  levothyroxine (SYNTHROID, LEVOTHROID) 125 MCG tablet TAKE ONE TABLET BY MOUTH DAILY 08/12/12   Ileana Ladd, MD  metFORMIN (GLUCOPHAGE) 1000 MG tablet TAKE 1 AND 1/2 TABLETS BY MOUTH TWICE DAILY 06/30/12   Ileana Ladd, MD  misoprostol (CYTOTEC) 200 MCG tablet Take 1 tablet (200 mcg total) by mouth 2 (two) times daily. 1 tab twice a day 05/21/12   Ileana Ladd, MD  niacin (NIASPAN) 1000 MG CR tablet Take 2,000 mg by mouth at bedtime.     Historical Provider, MD  nitroGLYCERIN (NITROLINGUAL) 0.4 MG/SPRAY spray Place 1 spray under the tongue every 5 (five) minutes as needed.      Historical Provider, MD  Omega-3 Fatty Acids (FISH OIL) 1000 MG CAPS Take by mouth daily.      Historical Provider, MD  omeprazole (PRILOSEC) 20 MG capsule Take 20 mg by mouth daily.      Historical Provider, MD  raloxifene (EVISTA) 60 MG tablet Take one po qd 07/25/12   Ileana Ladd, MD     ROS: As above in the HPI. All other systems are stable or negative.  OBJECTIVE: APPEARANCE:  Patient in no acute distress.The patient appeared well nourished and normally developed. Acyanotic. Waist: VITAL SIGNS:BP 154/85  Pulse 96  Temp(Src) 97.1 F (36.2 C) (Oral)  Ht 5\' 5"  (1.651 m)  Wt 182 lb (82.555 kg)  BMI 30.29 kg/m2 Obese WF  SKIN: warm and  Dry without overt rashes, tattoos and scars  HEAD and Neck: without JVD, Head and scalp: normal Eyes:No scleral icterus. Fundi normal, eye movements normal. Ears: Auricle normal, canal normal, Tympanic membranes normal, insufflation normal. Nose: normal Throat: normal Neck & thyroid: normal  CHEST & LUNGS: Chest  wall: normal Lungs: Clear  CVS: Reveals the PMI to be normally located. Regular rhythm, First and Second Heart sounds are normal,  absence of murmurs, rubs or gallops. Peripheral vasculature: Radial pulses: normal Dorsal pedis pulses: normal Posterior pulses: normal  ABDOMEN:  Appearance: obese Benign, no organomegaly, no masses, no Abdominal Aortic enlargement. No Guarding , no rebound. No Bruits. Bowel sounds: normal  RECTAL: N/A GU: N/A  EXTREMETIES: nonedematous. Both Femoral and Pedal pulses are normal.  MUSCULOSKELETAL:  Spine: normal Joints:right ankle lateral malleolar area is  Swollen.  NEUROLOGIC: oriented to time,place and person; nonfocal. Strength is normal Sensory is normal Reflexes are normal  ASSESSMENT: Diabetes - Plan: POCT glycosylated hemoglobin (Hb A1C), COMPLETE METABOLIC PANEL WITH GFR, NMR Lipoprofile with Lipids, POCT UA - Microalbumin, Microalbumin, urine  Need for Tdap vaccination - Plan: Tdap vaccine greater than or equal to 7yo IM  Routine health maintenance - Plan: Ambulatory referral to Gastroenterology  Arthritis of ankle - Plan: meloxicam (MOBIC) 7.5 MG tablet  Pain in joint, ankle and foot, right - Plan: DG Ankle Complete Right  Menopause - Plan: raloxifene (EVISTA) 60 MG tablet  PLAN: Orders Placed This Encounter  Procedures  . DG Ankle Complete Right    Standing Status: Future     Number of Occurrences: 1     Standing Expiration Date: 11/05/2013  Order Specific Question:  Reason for Exam (SYMPTOM  OR DIAGNOSIS REQUIRED)    Answer:  right ankle pain and  swelling    Order Specific Question:  Preferred imaging location?    Answer:  Internal  . Tdap vaccine greater than or equal to 7yo IM  . COMPLETE METABOLIC PANEL WITH GFR  . NMR Lipoprofile with Lipids  . Microalbumin, urine  . Ambulatory referral to Gastroenterology    Referral Priority:  Routine    Referral Type:  Consultation    Referral Reason:  Specialty Services  Required    Requested Specialty:  Gastroenterology    Number of Visits Requested:  1  . POCT glycosylated hemoglobin (Hb A1C)  . POCT UA - Microalbumin    [facility] reading (PRIMARY) by  Dr. Modesto Charon: Herby Abraham not reviewed by me. Await official reading.  Meds ordered this encounter  Medications  . raloxifene (EVISTA) 60 MG tablet    Sig: Take 1 tablet (60 mg total) by mouth daily.    Dispense:  90 tablet    Refill:  0  . meloxicam (MOBIC) 7.5 MG tablet    Sig: Take 1 tablet (7.5 mg total) by mouth daily.    Dispense:  30 tablet    Refill:  1   Return in about 6 weeks (around 10/17/2012) for Recheck medical problems. To recheck ankle and BMP  Haydan Mansouri P. Modesto Charon, M.D.

## 2012-09-05 NOTE — Patient Instructions (Addendum)
Schedule appt for mammogram at mobile unit  Tetanus, Diphtheria, Pertussis (Tdap) Vaccine What You Need to Know WHY GET VACCINATED? Tetanus, diphtheria and pertussis can be very serious diseases, even for adolescents and adults. Tdap vaccine can protect [place] from these diseases. TETANUS (Lockjaw) causes painful muscle tightening and stiffness, usually all over the body.  It can lead to tightening of muscles in the head and neck so you can't open your mouth, swallow, or sometimes even breathe. Tetanus kills about 1 out of 5 people who are infected. DIPHTHERIA can cause a thick coating to form in the back of the throat.  It can lead to breathing problems, paralysis, heart failure, and death. PERTUSSIS (Whooping Cough) causes severe coughing spells, which can cause difficulty breathing, vomiting and disturbed sleep.  It can also lead to weight loss, incontinence, and rib fractures. Up to 2 in 100 adolescents and 5 in 100 adults with pertussis are hospitalized or have complications, which could include pneumonia and death. These diseases are caused by bacteria. Diphtheria and pertussis are spread from person to person through coughing or sneezing. Tetanus enters the body through cuts, scratches, or wounds. Before vaccines, the [place] saw as many as 200,000 cases a year of diphtheria and pertussis, and hundreds of cases of tetanus. Since vaccination began, tetanus and diphtheria have dropped by about 99% and pertussis by about 80%. TDAP VACCINE Tdap vaccine can protect adolescents and adults from tetanus, diphtheria, and pertussis. One dose of Tdap is routinely given at age 34 or 41. People who did not get Tdap at that age should get it as soon as possible. Tdap is especially important for health care professionals and anyone having close contact with a baby younger than 12 months. Pregnant women should get a dose of Tdap during every pregnancy, to protect the newborn from pertussis. Infants are  most at risk for severe, life-threatening complications from pertussis. A similar vaccine, called Td, protects from tetanus and diphtheria, but not pertussis. A Td booster should be given every 10 years. Tdap may be given as one of these boosters if you have not already gotten a dose. Tdap may also be given after a severe cut or burn to prevent tetanus infection. Your doctor can give you more information. Tdap may safely be given at the same time as other vaccines. SOME PEOPLE SHOULD NOT GET THIS VACCINE  If you ever had a life-threatening allergic reaction after a dose of any tetanus, diphtheria, or pertussis containing vaccine, OR if you have a severe allergy to any part of this vaccine, you should not get Tdap. Tell your doctor if you have any severe allergies.  If you had a coma, or long or multiple seizures within 7 days after a childhood dose of DTP or DTaP, you should not get Tdap, unless a cause other than the vaccine was found. You can still get Td.  Talk to your doctor if you:  have epilepsy or another nervous system problem,  had severe pain or swelling after any vaccine containing diphtheria, tetanus or pertussis,  ever had Guillain-Barr Syndrome (GBS),  aren't feeling well on the day the shot is scheduled. RISKS OF A VACCINE REACTION With any medicine, including vaccines, there is a chance of side effects. These are usually mild and go away on their own, but serious reactions are also possible. Brief fainting spells can follow a vaccination, leading to injuries from falling. Sitting or lying down for about 15 minutes can help prevent these. Tell your  doctor if you feel dizzy or light-headed, or have vision changes or ringing in the ears. Mild problems following Tdap (Did not interfere with activities)  Pain where the shot was given (about 3 in 4 adolescents or 2 in 3 adults)  Redness or swelling where the shot was given (about 1 person in 5)  Mild fever of at least 100.19F  (up to about 1 in 25 adolescents or 1 in 100 adults)  Headache (about 3 or 4 people in 10)  Tiredness (about 1 person in 3 or 4)  Nausea, vomiting, diarrhea, stomach ache (up to 1 in 4 adolescents or 1 in 10 adults)  Chills, body aches, sore joints, rash, swollen glands (uncommon) Moderate problems following Tdap (Interfered with activities, but did not require medical attention)  Pain where the shot was given (about 1 in 5 adolescents or 1 in 100 adults)  Redness or swelling where the shot was given (up to about 1 in 16 adolescents or 1 in 25 adults)  Fever over 102F (about 1 in 100 adolescents or 1 in 250 adults)  Headache (about 3 in 20 adolescents or 1 in 10 adults)  Nausea, vomiting, diarrhea, stomach ache (up to 1 or 3 people in 100)  Swelling of the entire arm where the shot was given (up to about 3 in 100). Severe problems following Tdap (Unable to perform usual activities, required medical attention)  Swelling, severe pain, bleeding and redness in the arm where the shot was given (rare). A severe allergic reaction could occur after any vaccine (estimated less than 1 in a million doses). WHAT IF THERE IS A SERIOUS REACTION? What should I look for?  Look for anything that concerns you, such as signs of a severe allergic reaction, very high fever, or behavior changes. Signs of a severe allergic reaction can include hives, swelling of the face and throat, difficulty breathing, a fast heartbeat, dizziness, and weakness. These would start a few minutes to a few hours after the vaccination. What should I do?  If you think it is a severe allergic reaction or other emergency that can't wait, call 9-1-1 or get the person to the nearest hospital. Otherwise, call your doctor.  Afterward, the reaction should be reported to the "Vaccine Adverse Event Reporting System" (VAERS). Your doctor might file this report, or you can do it yourself through the VAERS web site at www.vaers.LAgents.nohhs.gov,  or by calling 1-667-644-2015. VAERS is only for reporting reactions. They do not give medical advice.  THE NATIONAL VACCINE INJURY COMPENSATION PROGRAM The National Vaccine Injury Compensation Program (VICP) is a federal program that was created to compensate people who may have been injured by certain vaccines. Persons who believe they may have been injured by a vaccine can learn about the program and about filing a claim by calling 1-828-113-8874 or visiting the VICP website at SpiritualWord.atwww.hrsa.gov/vaccinecompensation. HOW CAN I LEARN MORE?  Ask your doctor.  Call your local or state health department.  Contact the Centers for Disease Control and Prevention (CDC):  Call 469-737-39491-313-317-4924 or visit CDC's website at PicCapture.uywww.cdc.gov/vaccines. CDC Tdap Vaccine VIS (07/07/11) Document Released: 08/16/2011 Document Revised: 11/09/2011 Document Reviewed: 08/16/2011 ExitCare Patient Information 2014 [place].

## 2012-09-06 LAB — COMPLETE METABOLIC PANEL WITH GFR
ALT: 18 U/L (ref 0–35)
AST: 19 U/L (ref 0–37)
Albumin: 4.3 g/dL (ref 3.5–5.2)
Alkaline Phosphatase: 40 U/L (ref 39–117)
BUN: 12 mg/dL (ref 6–23)
CO2: 30 mEq/L (ref 19–32)
Calcium: 9.9 mg/dL (ref 8.4–10.5)
Chloride: 98 mEq/L (ref 96–112)
Creat: 0.82 mg/dL (ref 0.50–1.10)
GFR, Est African American: 84 mL/min
GFR, Est Non African American: 73 mL/min
Glucose, Bld: 141 mg/dL — ABNORMAL HIGH (ref 70–99)
Potassium: 3.4 mEq/L — ABNORMAL LOW (ref 3.5–5.3)
Sodium: 137 mEq/L (ref 135–145)
Total Bilirubin: 0.4 mg/dL (ref 0.3–1.2)
Total Protein: 6.8 g/dL (ref 6.0–8.3)

## 2012-09-06 LAB — NMR LIPOPROFILE WITH LIPIDS
Cholesterol, Total: 136 mg/dL (ref <200)
HDL Particle Number: 35.8 umol/L (ref >=30.5)
HDL Size: 10.2 nm (ref >=9.2)
HDL-C: 59 mg/dL (ref >=40)
LDL (calc): 69 mg/dL (ref <100)
LDL Particle Number: 389 nmol/L (ref <1000)
LDL Size: 21.3 nm (ref >20.5)
LP-IR Score: <25 (ref <=45)
Large HDL-P: 11.5 umol/L (ref >=4.8)
Large VLDL-P: 1.2 nmol/L (ref <=2.7)
Small LDL Particle Number: <90 nmol/L (ref <=527)
Triglycerides: 38 mg/dL (ref <150)
VLDL Size: 45.2 nm (ref <=46.6)

## 2012-09-06 LAB — MICROALBUMIN, URINE: Microalb, Ur: 0.5 mg/dL (ref 0.00–1.89)

## 2012-09-06 NOTE — Progress Notes (Signed)
Quick Note:  Call patient. Xray normal. No change in plan. ______

## 2012-09-09 ENCOUNTER — Other Ambulatory Visit: Payer: Self-pay | Admitting: Family Medicine

## 2012-09-09 DIAGNOSIS — R899 Unspecified abnormal finding in specimens from other organs, systems and tissues: Secondary | ICD-10-CM

## 2012-09-09 MED ORDER — POTASSIUM CHLORIDE ER 20 MEQ PO TBCR: EXTENDED_RELEASE_TABLET | ORAL | Status: DC

## 2012-09-09 NOTE — Progress Notes (Signed)
Quick Note:  Lab result close to goal. change in Medications : Increase the potassium to 20 mEq daily. Will change her Rx in [facility].Marland Kitchen Recheck potassium in 2 weeks. The Rest is good. Diet exercise and weight loss will help attain the goals especially the DM. ______

## 2012-09-12 ENCOUNTER — Telehealth: Payer: Self-pay | Admitting: Family Medicine

## 2012-09-17 ENCOUNTER — Other Ambulatory Visit: Payer: Self-pay | Admitting: *Deleted

## 2012-09-17 MED ORDER — NITROGLYCERIN 0.4 MG/SPRAY TL SOLN: Status: DC

## 2012-09-17 NOTE — Telephone Encounter (Signed)
LAST OV 09/05/12/ LAST RF 7/13.

## 2012-09-20 ENCOUNTER — Telehealth: Payer: Self-pay | Admitting: Family Medicine

## 2012-09-24 ENCOUNTER — Other Ambulatory Visit: Payer: Self-pay | Admitting: Family Medicine

## 2012-09-29 ENCOUNTER — Other Ambulatory Visit: Payer: Self-pay | Admitting: Family Medicine

## 2012-10-05 ENCOUNTER — Other Ambulatory Visit (INDEPENDENT_AMBULATORY_CARE_PROVIDER_SITE_OTHER): Payer: Medicare Other

## 2012-10-05 DIAGNOSIS — R6889 Other general symptoms and signs: Secondary | ICD-10-CM

## 2012-10-05 DIAGNOSIS — R899 Unspecified abnormal finding in specimens from other organs, systems and tissues: Secondary | ICD-10-CM

## 2012-10-05 NOTE — Progress Notes (Signed)
Patient came in for labs only.

## 2012-10-06 LAB — BASIC METABOLIC PANEL WITH GFR
BUN: 13 mg/dL (ref 6–23)
CO2: 29 mEq/L (ref 19–32)
Calcium: 9.8 mg/dL (ref 8.4–10.5)
Chloride: 100 mEq/L (ref 96–112)
Creat: 0.96 mg/dL (ref 0.50–1.10)
GFR, Est African American: 70 mL/min
GFR, Est Non African American: 61 mL/min
Glucose, Bld: 145 mg/dL — ABNORMAL HIGH (ref 70–99)
Potassium: 3.3 mEq/L — ABNORMAL LOW (ref 3.5–5.3)
Sodium: 138 mEq/L (ref 135–145)

## 2012-10-07 ENCOUNTER — Other Ambulatory Visit: Payer: Self-pay | Admitting: Family Medicine

## 2012-10-07 DIAGNOSIS — E876 Hypokalemia: Secondary | ICD-10-CM

## 2012-10-07 MED ORDER — TRIAMTERENE-HCTZ 37.5-25 MG PO CAPS: 1.0000 | ORAL_CAPSULE | ORAL | Status: DC

## 2012-10-07 NOTE — Progress Notes (Signed)
Quick Note:  Labs abnormal. Potassium still low Need to take Potassium tablets 40 meq daily for 3 days then discontinue. Because ,I will have to change her water /diuretic pill from HCTZ to Dyazide. To preserve her from losing her potassium. Recheck BMP in 2 weeks. Will make the change to dyazide and order the BMP for 2 weeks.  ______

## 2012-10-08 ENCOUNTER — Telehealth: Payer: Self-pay | Admitting: Family Medicine

## 2012-10-08 NOTE — Telephone Encounter (Signed)
Discussed labs and recommendations with patient. She is to take 2 of her potassium pills daily for 3 days and then discontinue the supplement because Dr. Modesto Charon is changing her fluid medication to preserve potassium. Patient stating understanding and agreement to plan.

## 2012-10-14 ENCOUNTER — Other Ambulatory Visit: Payer: Self-pay | Admitting: Family Medicine

## 2012-10-16 ENCOUNTER — Other Ambulatory Visit: Payer: Self-pay | Admitting: Family Medicine

## 2012-10-18 ENCOUNTER — Telehealth: Payer: Self-pay | Admitting: Family Medicine

## 2012-10-18 ENCOUNTER — Ambulatory Visit (INDEPENDENT_AMBULATORY_CARE_PROVIDER_SITE_OTHER): Payer: Medicare Other | Admitting: Family Medicine

## 2012-10-18 ENCOUNTER — Encounter: Payer: Self-pay | Admitting: Family Medicine

## 2012-10-18 VITALS — BP 159/89 | HR 90 | Temp 98.1°F | Wt 185.2 lb

## 2012-10-18 DIAGNOSIS — M899 Disorder of bone, unspecified: Secondary | ICD-10-CM

## 2012-10-18 DIAGNOSIS — I10 Essential (primary) hypertension: Secondary | ICD-10-CM

## 2012-10-18 DIAGNOSIS — K219 Gastro-esophageal reflux disease without esophagitis: Secondary | ICD-10-CM

## 2012-10-18 DIAGNOSIS — F4389 Other reactions to severe stress: Secondary | ICD-10-CM

## 2012-10-18 DIAGNOSIS — J4489 Other specified chronic obstructive pulmonary disease: Secondary | ICD-10-CM

## 2012-10-18 DIAGNOSIS — E785 Hyperlipidemia, unspecified: Secondary | ICD-10-CM

## 2012-10-18 DIAGNOSIS — F411 Generalized anxiety disorder: Secondary | ICD-10-CM

## 2012-10-18 DIAGNOSIS — J449 Chronic obstructive pulmonary disease, unspecified: Secondary | ICD-10-CM

## 2012-10-18 DIAGNOSIS — E119 Type 2 diabetes mellitus without complications: Secondary | ICD-10-CM

## 2012-10-18 DIAGNOSIS — J45909 Unspecified asthma, uncomplicated: Secondary | ICD-10-CM

## 2012-10-18 DIAGNOSIS — I2510 Atherosclerotic heart disease of native coronary artery without angina pectoris: Secondary | ICD-10-CM

## 2012-10-18 DIAGNOSIS — M19079 Primary osteoarthritis, unspecified ankle and foot: Secondary | ICD-10-CM

## 2012-10-18 DIAGNOSIS — F4329 Adjustment disorder with other symptoms: Secondary | ICD-10-CM

## 2012-10-18 DIAGNOSIS — E876 Hypokalemia: Secondary | ICD-10-CM

## 2012-10-18 DIAGNOSIS — F419 Anxiety disorder, unspecified: Secondary | ICD-10-CM

## 2012-10-18 DIAGNOSIS — F438 Other reactions to severe stress: Secondary | ICD-10-CM

## 2012-10-18 DIAGNOSIS — M8580 Other specified disorders of bone density and structure, unspecified site: Secondary | ICD-10-CM

## 2012-10-18 MED ORDER — MELOXICAM 7.5 MG PO TABS: 7.5000 mg | ORAL_TABLET | Freq: Every day | ORAL | Status: DC

## 2012-10-18 MED ORDER — BECLOMETHASONE DIPROPIONATE 40 MCG/ACT IN AERS: INHALATION_SPRAY | RESPIRATORY_TRACT | Status: DC

## 2012-10-18 MED ORDER — LORAZEPAM 0.5 MG PO TABS: 0.5000 mg | ORAL_TABLET | Freq: Two times a day (BID) | ORAL | Status: DC | PRN

## 2012-10-18 NOTE — Telephone Encounter (Signed)
[facility] notified spoke to [place] refills for acc-chek aviva plus strips and accu-chek fastclix lancets  X 6 months given

## 2012-10-18 NOTE — Progress Notes (Signed)
Patient ID: Denise Gardner, female   DOB: 04-17-1945, 67 y.o.   MRN: [id number] SUBJECTIVE: CC: Chief Complaint  Patient presents with  . Follow-up    6 wk follow up wants to come off some meds . ck ribs   . Medication Refill    refill meloxicam for 90 days    HPI:  Patient is here for follow up of Diabetes Mellitus/HLD/HTN Symptoms evaluated: Denies Nocturia ,Denies Urinary Frequency , denies Blurred vision ,deniesDizziness,denies.Dysuria,denies paresthesias, denies extremity pain or ulcers.Marland Kitchendenies chest pain. has had an annual eye exam. do check the feet. Does check CBGs. Average CBG:110 Denies episodes of hypoglycemia. Does have an emergency hypoglycemic plan. admits toCompliance with medications. Denies Problems with medications.  Here for follow up of the low potassium  Breakfast: light a banana, or  Cereal Lunch: squash and green beans, and potatoes Supper: leftovers.  Mother in the hospital needs a valium to rest.  Past Medical History  Diagnosis Date  . Asthma   . Thyroid disease   . Neuropathy   . Hypertension   . Chronic back pain   . Shingles   . GERD (gastroesophageal reflux disease)   . Anxiety     depression  . Migraine   . Insomnia   . Coronary artery disease     Taxus stent to RCA 2008 2.5 x 16, non obstructive 15% proximal right coronary artery, patent curcumflex LAD, preserved LV   Past Surgical History  Procedure Laterality Date  . Total abdominal hysterectomy     History   Social History  . Marital Status: Married    Spouse Name: N/A    Number of Children: N/A  . Years of Education: N/A   Occupational History  . retired     Medical laboratory scientific officer    Social History Main Topics  . Smoking status: Never Smoker   . Smokeless tobacco: Not on file  . Alcohol Use: No  . Drug Use: No  . Sexual Activity: Not on file   Other Topics Concern  . Not on file   Social History Narrative   Lives with sister taking care of her mother.  Lives here    Family History  Problem Relation Age of Onset  . Stroke Mother 93  . Coronary artery disease Brother 4  . Coronary artery disease Brother 21  . Cancer Sister     breast   Current Outpatient Prescriptions on File Prior to Visit  Medication Sig Dispense Refill  . ACCU-CHEK AVIVA PLUS test strip       . ACCU-CHEK FASTCLIX LANCETS MISC       . acetaminophen (TYLENOL) 325 MG tablet Take 650 mg by mouth every 6 (six) hours as needed.        Marland Kitchen aspirin 325 MG tablet Take 325 mg by mouth daily.        . cholecalciferol (VITAMIN D) 400 UNITS TABS Take by mouth. VITAMIN D3 daily       . cloNIDine (CATAPRES) 0.1 MG tablet Take 0.1 mg by mouth 2 (two) times daily.        Marland Kitchen diltiazem (MATZIM LA) 360 MG 24 hr tablet Take 360 mg by mouth daily.        . fenofibrate 54 MG tablet TAKE 1 TABLET BY MOUTH EVERY DAY  30 tablet  4  . glipiZIDE (GLUCOTROL) 5 MG tablet Take 5 mg by mouth 2 (two) times daily before a meal. And 1 po qhs      . IPRATROPIUM-ALBUTEROL  IN Inhale into the lungs once a week.       . isosorbide mononitrate (IMDUR) 60 MG 24 hr tablet TAKE 1 TABLET BY MOUTH EVERY MORNING  90 tablet  1  . JANUVIA 100 MG tablet       . levothyroxine (SYNTHROID, LEVOTHROID) 125 MCG tablet TAKE ONE TABLET BY MOUTH DAILY  90 tablet  2  . meloxicam (MOBIC) 7.5 MG tablet Take 1 tablet (7.5 mg total) by mouth daily.  30 tablet  1  . metFORMIN (GLUCOPHAGE) 1000 MG tablet TAKE 1 AND 1/2 TABLETS BY MOUTH TWICE DAILY  270 tablet  0  . niacin (NIASPAN) 1000 MG CR tablet TAKE 2 TABLETS BY MOUTH EVERY NIGHT AT BEDTIME  180 tablet  0  . nitroGLYCERIN (NITROLINGUAL) 0.4 MG/SPRAY spray USE AS DIRECTED FOR CHEST PAIN  4.9 g  1  . Omega-3 Fatty Acids (FISH OIL) 1000 MG CAPS Take by mouth daily.        Marland Kitchen omeprazole (PRILOSEC) 20 MG capsule Take 20 mg by mouth daily.        . Potassium Chloride ER (KLOR-CON 10) 20 MEQ TBCR TAKE 1 TABLET BY MOUTH DAILY  30 tablet  5  . QVAR 40 MCG/ACT inhaler INHALE 2 PUFFS TWICE DAILY   26.1 g  0  . raloxifene (EVISTA) 60 MG tablet Take 1 tablet (60 mg total) by mouth daily.  90 tablet  0  . triamterene-hydrochlorothiazide (DYAZIDE) 37.5-25 MG per capsule Take 1 each (1 capsule total) by mouth every morning.  30 capsule  5  . misoprostol (CYTOTEC) 200 MCG tablet Take 1 tablet (200 mcg total) by mouth 2 (two) times daily. 1 tab twice a day  180 tablet  3  . [DISCONTINUED] potassium chloride (K-DUR,KLOR-CON) 10 MEQ tablet Take 1 tablet (10 mEq total) by mouth daily.  30 tablet  1   No current facility-administered medications on file prior to visit.   Allergies  Allergen Reactions  . Crestor [Rosuvastatin]     Myalgias   . Zetia [Ezetimibe]     mylagia   Immunization History  Administered Date(s) Administered  . Pneumococcal Polysaccharide 10/22/2010  . Tdap 09/05/2012   Prior to Admission medications   Medication Sig Start Date End Date Taking? Authorizing Provider  ACCU-CHEK AVIVA PLUS test strip  04/02/12  Yes Historical Provider, MD  ACCU-CHEK FASTCLIX LANCETS MISC  04/02/12  Yes Historical Provider, MD  acetaminophen (TYLENOL) 325 MG tablet Take 650 mg by mouth every 6 (six) hours as needed.     Yes Historical Provider, MD  aspirin 325 MG tablet Take 325 mg by mouth daily.     Yes Historical Provider, MD  cholecalciferol (VITAMIN D) 400 UNITS TABS Take by mouth. VITAMIN D3 daily    Yes Historical Provider, MD  cloNIDine (CATAPRES) 0.1 MG tablet Take 0.1 mg by mouth 2 (two) times daily.     Yes Historical Provider, MD  diltiazem (MATZIM LA) 360 MG 24 hr tablet Take 360 mg by mouth daily.     Yes Historical Provider, MD  fenofibrate 54 MG tablet TAKE 1 TABLET BY MOUTH EVERY DAY 09/24/12  Yes Ileana Ladd, MD  glipiZIDE (GLUCOTROL) 5 MG tablet Take 5 mg by mouth 2 (two) times daily before a meal. And 1 po qhs   Yes Historical Provider, MD  IPRATROPIUM-ALBUTEROL IN Inhale into the lungs once a week.    Yes Historical Provider, MD  isosorbide mononitrate (IMDUR) 60 MG 24  hr tablet TAKE  1 TABLET BY MOUTH EVERY MORNING 09/09/12  Yes Ileana Ladd, MD  JANUVIA 100 MG tablet  03/06/12  Yes Historical Provider, MD  levothyroxine (SYNTHROID, LEVOTHROID) 125 MCG tablet TAKE ONE TABLET BY MOUTH DAILY 08/12/12  Yes Ileana Ladd, MD  meloxicam (MOBIC) 7.5 MG tablet Take 1 tablet (7.5 mg total) by mouth daily. 09/05/12  Yes Ileana Ladd, MD  metFORMIN (GLUCOPHAGE) 1000 MG tablet TAKE 1 AND 1/2 TABLETS BY MOUTH TWICE DAILY 09/29/12  Yes Ileana Ladd, MD  niacin (NIASPAN) 1000 MG CR tablet TAKE 2 TABLETS BY MOUTH EVERY NIGHT AT BEDTIME 09/29/12  Yes Ileana Ladd, MD  nitroGLYCERIN (NITROLINGUAL) 0.4 MG/SPRAY spray USE AS DIRECTED FOR CHEST PAIN 09/24/12  Yes Ileana Ladd, MD  Omega-3 Fatty Acids (FISH OIL) 1000 MG CAPS Take by mouth daily.     Yes Historical Provider, MD  omeprazole (PRILOSEC) 20 MG capsule Take 20 mg by mouth daily.     Yes Historical Provider, MD  Potassium Chloride ER (KLOR-CON 10) 20 MEQ TBCR TAKE 1 TABLET BY MOUTH DAILY 09/09/12  Yes Ileana Ladd, MD  QVAR 40 MCG/ACT inhaler INHALE 2 PUFFS TWICE DAILY 10/16/12  Yes Ileana Ladd, MD  raloxifene (EVISTA) 60 MG tablet Take 1 tablet (60 mg total) by mouth daily. 09/05/12  Yes Ileana Ladd, MD  triamterene-hydrochlorothiazide (DYAZIDE) 37.5-25 MG per capsule Take 1 each (1 capsule total) by mouth every morning. 10/07/12  Yes Ileana Ladd, MD  misoprostol (CYTOTEC) 200 MCG tablet Take 1 tablet (200 mcg total) by mouth 2 (two) times daily. 1 tab twice a day 05/21/12   Ileana Ladd, MD     ROS: As above in the HPI. All other systems are stable or negative.  OBJECTIVE: APPEARANCE:  Patient in no acute distress.The patient appeared well nourished and normally developed. Acyanotic. Waist: VITAL SIGNS:BP 159/89  Pulse 90  Temp(Src) 98.1 F (36.7 C)  Wt 185 lb 3.2 oz (84.006 kg)  BMI 30.82 kg/m2 WF   SKIN: warm and  Dry without overt rashes, tattoos and scars  HEAD and Neck: without JVD, Head and  scalp: normal Eyes:No scleral icterus. Fundi normal, eye movements normal. Ears: Auricle normal, canal normal, Tympanic membranes normal, insufflation normal. Nose: normal Throat: normal Neck & thyroid: normal  CHEST & LUNGS: Chest wall: normal Lungs: Clear  CVS: Reveals the PMI to be normally located. Regular rhythm, First and Second Heart sounds are normal,  absence of murmurs, rubs or gallops. Peripheral vasculature: Radial pulses: normal Dorsal pedis pulses: normal Posterior pulses: normal  ABDOMEN:  Appearance: normal Benign, no organomegaly, no masses, no Abdominal Aortic enlargement. No Guarding , no rebound. No Bruits. Bowel sounds: normal  RECTAL: N/A GU: N/A  EXTREMETIES: nonedematous.  MUSCULOSKELETAL:  Spine: normal Joints: arthritis of right ankle  NEUROLOGIC: oriented to time,place and person; nonfocal. Strength is normal Sensory is normal Reflexes are normal Cranial Nerves are normal.  Psychiatry: stressed and worried and anxious. not delusional, not suicidal.  ASSESSMENT: Arthritis of ankle - Plan: meloxicam (MOBIC) 7.5 MG tablet  Anxiety state, unspecified - Plan: LORazepam (ATIVAN) 0.5 MG tablet  Stress and adjustment reaction - Plan: LORazepam (ATIVAN) 0.5 MG tablet  Type II or unspecified type diabetes mellitus without mention of complication, not stated as uncontrolled  Unspecified asthma  Anxiety  COPD (chronic obstructive pulmonary disease) - Plan: beclomethasone (QVAR) 40 MCG/ACT inhaler  Coronary artery disease  GERD (gastroesophageal reflux disease)  Hypertension  Hyperlipidemia  Osteopenia  Hypokalemia - Plan: BMP8+EGFR   PLAN:  Orders Placed This Encounter  Procedures  . BMP8+EGFR    Meds ordered this encounter  Medications  . DISCONTD: hydrochlorothiazide (HYDRODIURIL) 25 MG tablet    Sig:   . meloxicam (MOBIC) 7.5 MG tablet    Sig: Take 1 tablet (7.5 mg total) by mouth daily.    Dispense:  90 tablet     Refill:  1  . beclomethasone (QVAR) 40 MCG/ACT inhaler    Sig: INHALE 2 PUFFS TWICE DAILY    Dispense:  26.1 g    Refill:  1  . LORazepam (ATIVAN) 0.5 MG tablet    Sig: Take 1 tablet (0.5 mg total) by mouth 2 (two) times daily as needed for anxiety.    Dispense:  10 tablet    Refill:  0   Medications Discontinued During This Encounter  Medication Reason  . Chlorpheniramine Maleate (ALLERGY PO) Completed Course  . hydrochlorothiazide (HYDRODIURIL) 25 MG tablet Discontinued by provider  . KLOR-CON 10 10 MEQ tablet Change in therapy  . meloxicam (MOBIC) 7.5 MG tablet Reorder  . QVAR 40 MCG/ACT inhaler Reorder   Supportive therapy.  Return in about 2 months (around 12/18/2012) for Recheck medical problems.  Camile Esters P. Modesto Charon, M.D.

## 2012-10-19 LAB — BMP8+EGFR
BUN/Creatinine Ratio: 11 (ref 11–26)
BUN: 12 mg/dL (ref 8–27)
CO2: 26 mmol/L (ref 18–29)
Calcium: 10.6 mg/dL — ABNORMAL HIGH (ref 8.6–10.2)
Chloride: 98 mmol/L (ref 97–108)
Creatinine, Ser: 1.13 mg/dL — ABNORMAL HIGH (ref 0.57–1.00)
GFR calc Af Amer: 57 mL/min/{1.73_m2} — ABNORMAL LOW (ref >59)
GFR calc non Af Amer: 50 mL/min/{1.73_m2} — ABNORMAL LOW (ref >59)
Glucose: 132 mg/dL — ABNORMAL HIGH (ref 65–99)
Potassium: 3.9 mmol/L (ref 3.5–5.2)
Sodium: 141 mmol/L (ref 134–144)

## 2012-10-19 NOTE — Progress Notes (Signed)
Quick Note:  Labs abnormal. Kidney functions worse. Recommend stop the meloxicam and use tylenol instead. RTC in 6 weeks.  ______

## 2012-10-30 NOTE — Telephone Encounter (Signed)
RX done on 10-18-2012 by Lance Bosch to [facility].

## 2012-11-02 ENCOUNTER — Other Ambulatory Visit: Payer: Self-pay | Admitting: Family Medicine

## 2012-11-16 ENCOUNTER — Other Ambulatory Visit: Payer: Self-pay | Admitting: Family Medicine

## 2012-12-02 ENCOUNTER — Other Ambulatory Visit: Payer: Self-pay | Admitting: Family Medicine

## 2012-12-05 ENCOUNTER — Other Ambulatory Visit: Payer: Self-pay | Admitting: Family Medicine

## 2012-12-06 NOTE — Telephone Encounter (Signed)
[facility] notified

## 2012-12-06 NOTE — Telephone Encounter (Signed)
Last seen 10/18/12  [facility] 

## 2012-12-06 NOTE — Telephone Encounter (Signed)
Prescription renewed in [facility]. 

## 2012-12-09 ENCOUNTER — Other Ambulatory Visit: Payer: Self-pay | Admitting: Family Medicine

## 2012-12-11 NOTE — Telephone Encounter (Signed)
Last seen and last glucose 10/18/12  Pharmacy requesting a 90 day supply  [facility]

## 2012-12-13 NOTE — Telephone Encounter (Signed)
Call patient : Prescription refilled & sent to pharmacy in [facility]. 

## 2012-12-17 ENCOUNTER — Other Ambulatory Visit: Payer: Self-pay

## 2012-12-17 MED ORDER — SITAGLIPTIN PHOSPHATE 100 MG PO TABS: 100.0000 mg | ORAL_TABLET | Freq: Every day | ORAL | Status: DC

## 2012-12-17 NOTE — Telephone Encounter (Signed)
Last seen and last glucose 10/18/12  [facility]  pharmacy requesting 90 day supply

## 2012-12-17 NOTE — Telephone Encounter (Signed)
Last seen and last glucose 10/18/12  [facility]  Requesting 90 day supply

## 2012-12-24 ENCOUNTER — Other Ambulatory Visit: Payer: Self-pay | Admitting: Family Medicine

## 2012-12-27 ENCOUNTER — Ambulatory Visit (INDEPENDENT_AMBULATORY_CARE_PROVIDER_SITE_OTHER): Payer: Medicare Other | Admitting: Family Medicine

## 2012-12-27 ENCOUNTER — Encounter: Payer: Self-pay | Admitting: Family Medicine

## 2012-12-27 VITALS — BP 145/80 | HR 96 | Temp 97.4°F | Ht 65.25 in | Wt 185.2 lb

## 2012-12-27 DIAGNOSIS — M8580 Other specified disorders of bone density and structure, unspecified site: Secondary | ICD-10-CM

## 2012-12-27 DIAGNOSIS — E1149 Type 2 diabetes mellitus with other diabetic neurological complication: Secondary | ICD-10-CM | POA: Insufficient documentation

## 2012-12-27 DIAGNOSIS — F419 Anxiety disorder, unspecified: Secondary | ICD-10-CM

## 2012-12-27 DIAGNOSIS — K219 Gastro-esophageal reflux disease without esophagitis: Secondary | ICD-10-CM

## 2012-12-27 DIAGNOSIS — E785 Hyperlipidemia, unspecified: Secondary | ICD-10-CM

## 2012-12-27 DIAGNOSIS — I2510 Atherosclerotic heart disease of native coronary artery without angina pectoris: Secondary | ICD-10-CM

## 2012-12-27 DIAGNOSIS — J45909 Unspecified asthma, uncomplicated: Secondary | ICD-10-CM

## 2012-12-27 DIAGNOSIS — E039 Hypothyroidism, unspecified: Secondary | ICD-10-CM | POA: Insufficient documentation

## 2012-12-27 DIAGNOSIS — I10 Essential (primary) hypertension: Secondary | ICD-10-CM

## 2012-12-27 DIAGNOSIS — M899 Disorder of bone, unspecified: Secondary | ICD-10-CM

## 2012-12-27 DIAGNOSIS — F411 Generalized anxiety disorder: Secondary | ICD-10-CM

## 2012-12-27 MED ORDER — PREGABALIN 50 MG PO CAPS: 50.0000 mg | ORAL_CAPSULE | Freq: Three times a day (TID) | ORAL | Status: DC

## 2012-12-27 MED ORDER — METFORMIN HCL 1000 MG PO TABS: 1000.0000 mg | ORAL_TABLET | Freq: Two times a day (BID) | ORAL | Status: DC

## 2012-12-27 NOTE — Progress Notes (Signed)
Patient ID: Denise Gardner, female   DOB: November 04, 1945, 67 y.o.   MRN: [id number] SUBJECTIVE: CC: Chief Complaint  Patient presents with  . Follow-up    follow up please clarify potassium and wanrts something for her feet and needs rx for mettformin for her insurance    HPI: Patient is here for follow up of Diabetes Mellitus/HTN/HLD/CAD/COPD: Symptoms evaluated: Denies Nocturia ,Denies Urinary Frequency , denies Blurred vision ,deniesDizziness,denies.Dysuria,denies paresthesias, denies extremity pain or ulcers.Marland Kitchendenies chest pain. has had an annual eye exam. do check the feet. Does check CBGs. Average WUJ:WJXBJ Okay Denies episodes of hypoglycemia. Does have an emergency hypoglycemic plan. admits toCompliance with medications. Denies Problems with medications.  Needed to clarify her Klor Con dose. She has 2 different doses 10 meq and 20 meq. She has been hospitalized before for severe hypokalemia.  Past Medical History  Diagnosis Date  . Asthma   . Thyroid disease   . Neuropathy   . Hypertension   . Chronic back pain   . Shingles   . GERD (gastroesophageal reflux disease)   . Anxiety     depression  . Migraine   . Insomnia   . Coronary artery disease     Taxus stent to RCA 2008 2.5 x 16, non obstructive 15% proximal right coronary artery, patent curcumflex LAD, preserved LV   Past Surgical History  Procedure Laterality Date  . Total abdominal hysterectomy     History   Social History  . Marital Status: Married    Spouse Name: N/A    Number of Children: N/A  . Years of Education: N/A   Occupational History  . retired     Medical laboratory scientific officer    Social History Main Topics  . Smoking status: Never Smoker   . Smokeless tobacco: Not on file  . Alcohol Use: No  . Drug Use: No  . Sexual Activity: Not on file   Other Topics Concern  . Not on file   Social History Narrative   Lives with sister taking care of her mother.  Lives here   Family History  Problem Relation Age  of Onset  . Stroke Mother 21  . Coronary artery disease Brother 44  . Coronary artery disease Brother 9  . Cancer Sister     breast   Current Outpatient Prescriptions on File Prior to Visit  Medication Sig Dispense Refill  . ACCU-CHEK AVIVA PLUS test strip       . ACCU-CHEK FASTCLIX LANCETS MISC       . acetaminophen (TYLENOL) 325 MG tablet Take 325 mg by mouth every 6 (six) hours as needed.       Marland Kitchen aspirin 325 MG tablet Take 325 mg by mouth daily.        . beclomethasone (QVAR) 40 MCG/ACT inhaler INHALE 2 PUFFS TWICE DAILY  26.1 g  1  . cholecalciferol (VITAMIN D) 400 UNITS TABS Take by mouth. VITAMIN D3 daily       . cloNIDine (CATAPRES) 0.1 MG tablet Take 0.1 mg by mouth 2 (two) times daily.        Marland Kitchen diltiazem (MATZIM LA) 360 MG 24 hr tablet Take 360 mg by mouth daily.        . fenofibrate 54 MG tablet TAKE 1 TABLET BY MOUTH EVERY DAY  30 tablet  4  . glipiZIDE (GLUCOTROL XL) 5 MG 24 hr tablet TAKE 2 TABLETS BY MOUTH EVERY MORNING AND 1 TABLET EVERY EVENING  270 tablet  0  . IPRATROPIUM-ALBUTEROL IN  Inhale into the lungs once a week.       . isosorbide mononitrate (IMDUR) 60 MG 24 hr tablet TAKE 1 TABLET BY MOUTH EVERY MORNING  90 tablet  1  . levothyroxine (SYNTHROID, LEVOTHROID) 125 MCG tablet TAKE ONE TABLET BY MOUTH DAILY  90 tablet  2  . LORazepam (ATIVAN) 0.5 MG tablet Take 1 tablet (0.5 mg total) by mouth 2 (two) times daily as needed for anxiety.  10 tablet  0  . misoprostol (CYTOTEC) 200 MCG tablet Take 1 tablet (200 mcg total) by mouth 2 (two) times daily. 1 tab twice a day  180 tablet  3  . niacin (NIASPAN) 1000 MG CR tablet TAKE 2 TABLETS BY MOUTH EVERY NIGHT AT BEDTIME  180 tablet  0  . nitroGLYCERIN (NITROLINGUAL) 0.4 MG/SPRAY spray USE AS DIRECTED FOR CHEST PAIN  12 g  0  . Omega-3 Fatty Acids (FISH OIL) 1000 MG CAPS Take by mouth daily.        Marland Kitchen omeprazole (PRILOSEC) 20 MG capsule Take 20 mg by mouth daily.        . Potassium Chloride ER (KLOR-CON 10) 20 MEQ TBCR TAKE  1 TABLET BY MOUTH DAILY  30 tablet  5  . raloxifene (EVISTA) 60 MG tablet TAKE 1 TABLET BY MOUTH EVERY DAY  90 tablet  0  . sitaGLIPtin (JANUVIA) 100 MG tablet Take 1 tablet (100 mg total) by mouth daily.  90 tablet  0  . triamterene-hydrochlorothiazide (DYAZIDE) 37.5-25 MG per capsule Take 1 each (1 capsule total) by mouth every morning.  30 capsule  5  . meloxicam (MOBIC) 7.5 MG tablet Take 1 tablet (7.5 mg total) by mouth daily.  90 tablet  1  . [DISCONTINUED] potassium chloride (K-DUR,KLOR-CON) 10 MEQ tablet Take 1 tablet (10 mEq total) by mouth daily.  30 tablet  1   No current facility-administered medications on file prior to visit.   Allergies  Allergen Reactions  . Crestor [Rosuvastatin]     Myalgias   . Zetia [Ezetimibe]     mylagia   Immunization History  Administered Date(s) Administered  . Pneumococcal Polysaccharide 10/22/2010  . Tdap 09/05/2012   Prior to Admission medications   Medication Sig Start Date End Date Taking? Authorizing Provider  ACCU-CHEK AVIVA PLUS test strip  04/02/12   Historical Provider, MD  ACCU-CHEK FASTCLIX LANCETS MISC  04/02/12   Historical Provider, MD  acetaminophen (TYLENOL) 325 MG tablet Take 650 mg by mouth every 6 (six) hours as needed.      Historical Provider, MD  aspirin 325 MG tablet Take 325 mg by mouth daily.      Historical Provider, MD  beclomethasone (QVAR) 40 MCG/ACT inhaler INHALE 2 PUFFS TWICE DAILY 10/18/12   Ileana Ladd, MD  cholecalciferol (VITAMIN D) 400 UNITS TABS Take by mouth. VITAMIN D3 daily     Historical Provider, MD  cloNIDine (CATAPRES) 0.1 MG tablet Take 0.1 mg by mouth 2 (two) times daily.      Historical Provider, MD  diltiazem (MATZIM LA) 360 MG 24 hr tablet Take 360 mg by mouth daily.      Historical Provider, MD  fenofibrate 54 MG tablet TAKE 1 TABLET BY MOUTH EVERY DAY 09/24/12   Ileana Ladd, MD  glipiZIDE (GLUCOTROL XL) 5 MG 24 hr tablet TAKE 2 TABLETS BY MOUTH EVERY MORNING AND 1 TABLET EVERY EVENING  12/09/12   Ileana Ladd, MD  glipiZIDE (GLUCOTROL) 5 MG tablet Take 5 mg by mouth  2 (two) times daily before a meal. And 1 po qhs    Historical Provider, MD  IPRATROPIUM-ALBUTEROL IN Inhale into the lungs once a week.     Historical Provider, MD  isosorbide mononitrate (IMDUR) 60 MG 24 hr tablet TAKE 1 TABLET BY MOUTH EVERY MORNING 09/09/12   Ileana Ladd, MD  levothyroxine (SYNTHROID, LEVOTHROID) 125 MCG tablet TAKE ONE TABLET BY MOUTH DAILY 08/12/12   Ileana Ladd, MD  LORazepam (ATIVAN) 0.5 MG tablet Take 1 tablet (0.5 mg total) by mouth 2 (two) times daily as needed for anxiety. 10/18/12   Ileana Ladd, MD  meloxicam (MOBIC) 7.5 MG tablet Take 1 tablet (7.5 mg total) by mouth daily. 10/18/12   Ileana Ladd, MD  metFORMIN (GLUCOPHAGE) 1000 MG tablet TAKE 1 1/2 TABLET BY MOUTH TWICE DAILY 11/02/12   Ileana Ladd, MD  misoprostol (CYTOTEC) 200 MCG tablet Take 1 tablet (200 mcg total) by mouth 2 (two) times daily. 1 tab twice a day 05/21/12   Ileana Ladd, MD  niacin (NIASPAN) 1000 MG CR tablet TAKE 2 TABLETS BY MOUTH EVERY NIGHT AT BEDTIME 11/16/12   Ileana Ladd, MD  nitroGLYCERIN (NITROLINGUAL) 0.4 MG/SPRAY spray USE AS DIRECTED FOR CHEST PAIN 12/05/12   Ileana Ladd, MD  Omega-3 Fatty Acids (FISH OIL) 1000 MG CAPS Take by mouth daily.      Historical Provider, MD  omeprazole (PRILOSEC) 20 MG capsule Take 20 mg by mouth daily.      Historical Provider, MD  Potassium Chloride ER (KLOR-CON 10) 20 MEQ TBCR TAKE 1 TABLET BY MOUTH DAILY 09/09/12   Ileana Ladd, MD  raloxifene (EVISTA) 60 MG tablet TAKE 1 TABLET BY MOUTH EVERY DAY 12/02/12   Ernestina Penna, MD  sitaGLIPtin (JANUVIA) 100 MG tablet Take 1 tablet (100 mg total) by mouth daily. 12/17/12   Mary-Margaret Daphine Deutscher, FNP  triamterene-hydrochlorothiazide (DYAZIDE) 37.5-25 MG per capsule Take 1 each (1 capsule total) by mouth every morning. 10/07/12   Ileana Ladd, MD     ROS: As above in the HPI. All other systems are stable or  negative.  OBJECTIVE: APPEARANCE:  Patient in no acute distress.The patient appeared well nourished and normally developed. Acyanotic. Waist: VITAL SIGNS:BP 145/80  Pulse 96  Temp(Src) 97.4 F (36.3 C) (Oral)  Ht 5' 5.25" (1.657 m)  Wt 185 lb 3.2 oz (84.006 kg)  BMI 30.6 kg/m2  WF obese Recheck BP 122/70  SKIN: warm and  Dry without overt rashes, tattoos and scars  HEAD and Neck: without JVD, Head and scalp: normal Eyes:No scleral icterus. Fundi normal, eye movements normal. Ears: Auricle normal, canal normal, Tympanic membranes normal, insufflation normal. Nose: normal Throat: normal Neck & thyroid: normal  CHEST & LUNGS: Chest wall: normal Lungs: Clear  CVS: Reveals the PMI to be normally located. Regular rhythm, First and Second Heart sounds are normal,  absence of murmurs, rubs or gallops. Peripheral vasculature: Radial pulses: normal Dorsal pedis pulses: normal Posterior pulses: normal  ABDOMEN:  Appearance: Obese Benign, no organomegaly, no masses, no Abdominal Aortic enlargement. No Guarding , no rebound. No Bruits. Bowel sounds: normal  RECTAL: N/A GU: N/A  EXTREMETIES: nonedematous.  MUSCULOSKELETAL:  Spine: normal Joints: intact  NEUROLOGIC: oriented to time,place and person; nonfocal. Strength is normal Sensory is abnormal. Reflexes are normal Cranial Nerves are normal. See foot exam module  ASSESSMENT: Type II or unspecified type diabetes mellitus with neurological manifestations, not stated as uncontrolled(250.60) - Plan: pregabalin (LYRICA)  50 MG capsule, metFORMIN (GLUCOPHAGE) 1000 MG tablet, POCT glycosylated hemoglobin (Hb A1C), CMP14+EGFR  Coronary artery disease  Hypertension  Hyperlipidemia - Plan: CMP14+EGFR, NMR, lipoprofile  Anxiety  Unspecified asthma  GERD (gastroesophageal reflux disease)  Osteopenia  Unspecified hypothyroidism - Plan: TSH  PLAN:  Orders Placed This Encounter  Procedures  . CMP14+EGFR     Standing Status: Future     Number of Occurrences:      Standing Expiration Date: 12/27/2013  . NMR, lipoprofile    Standing Status: Future     Number of Occurrences:      Standing Expiration Date: 12/27/2013  . TSH    Standing Status: Future     Number of Occurrences:      Standing Expiration Date: 12/27/2013  . POCT glycosylated hemoglobin (Hb A1C)    Standing Status: Future     Number of Occurrences:      Standing Expiration Date: 12/28/2012   Meds ordered this encounter  Medications  . pregabalin (LYRICA) 50 MG capsule    Sig: Take 1 capsule (50 mg total) by mouth 3 (three) times daily.    Dispense:  90 capsule    Refill:  2  . metFORMIN (GLUCOPHAGE) 1000 MG tablet    Sig: Take 1 tablet (1,000 mg total) by mouth 2 (two) times daily with a meal.    Dispense:  180 tablet    Refill:  3   Medications Discontinued During This Encounter  Medication Reason  . glipiZIDE (GLUCOTROL) 5 MG tablet Change in therapy  . metFORMIN (GLUCOPHAGE) 1000 MG tablet Reorder  discussed healthy lifestyle.and activity for exercise.  Return in about 4 weeks (around 01/24/2013) for Recheck medical problems.and adjust gabapentin dose.  Denise Gardner, M.D.

## 2012-12-27 NOTE — Telephone Encounter (Signed)
Last seen 01/30/12  [facility]

## 2012-12-28 ENCOUNTER — Other Ambulatory Visit (INDEPENDENT_AMBULATORY_CARE_PROVIDER_SITE_OTHER): Payer: Medicare Other

## 2012-12-28 ENCOUNTER — Other Ambulatory Visit: Payer: Self-pay | Admitting: Family Medicine

## 2012-12-28 DIAGNOSIS — E1149 Type 2 diabetes mellitus with other diabetic neurological complication: Secondary | ICD-10-CM

## 2012-12-28 DIAGNOSIS — E785 Hyperlipidemia, unspecified: Secondary | ICD-10-CM

## 2012-12-28 DIAGNOSIS — E876 Hypokalemia: Secondary | ICD-10-CM

## 2012-12-28 DIAGNOSIS — E039 Hypothyroidism, unspecified: Secondary | ICD-10-CM

## 2012-12-28 NOTE — Telephone Encounter (Signed)
Call patient : Prescription refilled & sent to pharmacy in [facility]. 

## 2012-12-28 NOTE — Progress Notes (Signed)
Pt came in for labs only 

## 2012-12-30 LAB — CMP14+EGFR
ALT: 13 IU/L (ref 0–32)
AST: 22 IU/L (ref 0–40)
Albumin/Globulin Ratio: 1.7 (ref 1.1–2.5)
Albumin: 4.5 g/dL (ref 3.6–4.8)
Alkaline Phosphatase: 40 IU/L (ref 39–117)
BUN/Creatinine Ratio: 13 (ref 11–26)
BUN: 13 mg/dL (ref 8–27)
CO2: 25 mmol/L (ref 18–29)
Calcium: 10.4 mg/dL — ABNORMAL HIGH (ref 8.6–10.2)
Chloride: 96 mmol/L — ABNORMAL LOW (ref 97–108)
Creatinine, Ser: 0.99 mg/dL (ref 0.57–1.00)
GFR calc Af Amer: 67 mL/min/{1.73_m2} (ref >59)
GFR calc non Af Amer: 58 mL/min/{1.73_m2} — ABNORMAL LOW (ref >59)
Globulin, Total: 2.6 g/dL (ref 1.5–4.5)
Glucose: 113 mg/dL — ABNORMAL HIGH (ref 65–99)
Potassium: 4.1 mmol/L (ref 3.5–5.2)
Sodium: 139 mmol/L (ref 134–144)
Total Bilirubin: 0.3 mg/dL (ref 0.0–1.2)
Total Protein: 7.1 g/dL (ref 6.0–8.5)

## 2012-12-30 LAB — NMR, LIPOPROFILE
Cholesterol: 155 mg/dL (ref <200)
HDL Cholesterol by NMR: 66 mg/dL (ref >=40)
HDL Particle Number: 36.7 umol/L (ref >=30.5)
LDL Particle Number: 563 nmol/L (ref <1000)
LDL Size: 20.6 nm (ref >20.5)
LDLC SERPL CALC-MCNC: 80 mg/dL (ref <100)
LP-IR Score: <25 (ref <=45)
Small LDL Particle Number: 242 nmol/L (ref <=527)
Triglycerides by NMR: 43 mg/dL (ref <150)

## 2012-12-30 LAB — TSH: TSH: 0.949 u[IU]/mL (ref 0.450–4.500)

## 2013-01-08 ENCOUNTER — Encounter: Payer: Self-pay | Admitting: *Deleted

## 2013-01-08 LAB — POCT GLYCOSYLATED HEMOGLOBIN (HGB A1C): Hemoglobin A1C: 6.3

## 2013-01-08 NOTE — Progress Notes (Signed)
Quick Note:  Copy of labs sent to patient ______ 

## 2013-01-15 ENCOUNTER — Other Ambulatory Visit: Payer: Self-pay | Admitting: Family Medicine

## 2013-01-23 ENCOUNTER — Ambulatory Visit (INDEPENDENT_AMBULATORY_CARE_PROVIDER_SITE_OTHER): Payer: Medicare Other | Admitting: Family Medicine

## 2013-01-23 ENCOUNTER — Encounter: Payer: Self-pay | Admitting: Family Medicine

## 2013-01-23 VITALS — BP 157/78 | HR 75 | Temp 97.7°F | Ht 66.0 in | Wt 191.8 lb

## 2013-01-23 DIAGNOSIS — I10 Essential (primary) hypertension: Secondary | ICD-10-CM

## 2013-01-23 DIAGNOSIS — K219 Gastro-esophageal reflux disease without esophagitis: Secondary | ICD-10-CM

## 2013-01-23 DIAGNOSIS — E785 Hyperlipidemia, unspecified: Secondary | ICD-10-CM

## 2013-01-23 DIAGNOSIS — J4489 Other specified chronic obstructive pulmonary disease: Secondary | ICD-10-CM

## 2013-01-23 DIAGNOSIS — E1149 Type 2 diabetes mellitus with other diabetic neurological complication: Secondary | ICD-10-CM

## 2013-01-23 DIAGNOSIS — Z1239 Encounter for other screening for malignant neoplasm of breast: Secondary | ICD-10-CM | POA: Insufficient documentation

## 2013-01-23 DIAGNOSIS — J449 Chronic obstructive pulmonary disease, unspecified: Secondary | ICD-10-CM

## 2013-01-23 DIAGNOSIS — E1142 Type 2 diabetes mellitus with diabetic polyneuropathy: Secondary | ICD-10-CM

## 2013-01-23 DIAGNOSIS — I2510 Atherosclerotic heart disease of native coronary artery without angina pectoris: Secondary | ICD-10-CM

## 2013-01-23 MED ORDER — PREGABALIN 100 MG PO CAPS: 100.0000 mg | ORAL_CAPSULE | Freq: Three times a day (TID) | ORAL | Status: DC

## 2013-01-23 NOTE — Progress Notes (Signed)
Patient ID: Denise Gardner, female   DOB: 1946-02-28, 67 y.o.   MRN: [id number] SUBJECTIVE: CC: Chief Complaint  Patient presents with  . Follow-up    4 week follow jup    HPI: Patient is here for follow up of Diabetes Mellitus/hypothyroid/CAD/COPD Symptoms evaluated: Denies Nocturia ,Denies Urinary Frequency , denies Blurred vision ,deniesDizziness,denies.Dysuria,denies paresthesias, denies extremity pain or ulcers.Marland Kitchendenies chest pain. has had an annual eye exam. do check the feet. Does check CBGs. Average CBG: Denies episodes of hypoglycemia. Does have an emergency hypoglycemic plan. admits toCompliance with medications. Denies Problems with medications.   Here really to recheck on the effect of lyrica on her peripheral neuropathy.   Past Medical History  Diagnosis Date  . Asthma   . Thyroid disease   . Neuropathy   . Hypertension   . Chronic back pain   . Shingles   . GERD (gastroesophageal reflux disease)   . Anxiety     depression  . Migraine   . Insomnia   . Coronary artery disease     Taxus stent to RCA 2008 2.5 x 16, non obstructive 15% proximal right coronary artery, patent curcumflex LAD, preserved LV   Past Surgical History  Procedure Laterality Date  . Total abdominal hysterectomy     History   Social History  . Marital Status: Married    Spouse Name: N/A    Number of Children: N/A  . Years of Education: N/A   Occupational History  . retired     Medical laboratory scientific officer    Social History Main Topics  . Smoking status: Never Smoker   . Smokeless tobacco: Not on file  . Alcohol Use: No  . Drug Use: No  . Sexual Activity: Not on file   Other Topics Concern  . Not on file   Social History Narrative   Lives with sister taking care of her mother.  Lives here   Family History  Problem Relation Age of Onset  . Stroke Mother 70  . Coronary artery disease Brother 49  . Coronary artery disease Brother 94  . Cancer Sister     breast   Current Outpatient  Prescriptions on File Prior to Visit  Medication Sig Dispense Refill  . ACCU-CHEK AVIVA PLUS test strip       . ACCU-CHEK FASTCLIX LANCETS MISC       . acetaminophen (TYLENOL) 325 MG tablet Take 325 mg by mouth every 6 (six) hours as needed.       Marland Kitchen aspirin 325 MG tablet Take 325 mg by mouth daily.        . beclomethasone (QVAR) 40 MCG/ACT inhaler INHALE 2 PUFFS TWICE DAILY  26.1 g  1  . cholecalciferol (VITAMIN D) 400 UNITS TABS Take by mouth. VITAMIN D3 daily       . cloNIDine (CATAPRES) 0.1 MG tablet Take 0.1 mg by mouth 2 (two) times daily.        Marland Kitchen diltiazem (MATZIM LA) 360 MG 24 hr tablet Take 360 mg by mouth daily.        . fenofibrate 54 MG tablet TAKE 1 TABLET BY MOUTH EVERY DAY  30 tablet  4  . glipiZIDE (GLUCOTROL XL) 5 MG 24 hr tablet TAKE 2 TABLETS BY MOUTH EVERY MORNING AND 1 TABLET EVERY EVENING  270 tablet  0  . ipratropium-albuterol (DUONEB) 0.5-2.5 (3) MG/3ML SOLN USE 1 VIAL IN NEBULIZER EVERY 6 HOURS  1080 mL  0  . IPRATROPIUM-ALBUTEROL IN Inhale into the lungs once  a week.       . isosorbide mononitrate (IMDUR) 60 MG 24 hr tablet TAKE 1 TABLET BY MOUTH EVERY MORNING  90 tablet  1  . levothyroxine (SYNTHROID, LEVOTHROID) 125 MCG tablet TAKE ONE TABLET BY MOUTH DAILY  90 tablet  2  . LORazepam (ATIVAN) 0.5 MG tablet Take 1 tablet (0.5 mg total) by mouth 2 (two) times daily as needed for anxiety.  10 tablet  0  . meloxicam (MOBIC) 7.5 MG tablet Take 1 tablet (7.5 mg total) by mouth daily.  90 tablet  1  . metFORMIN (GLUCOPHAGE) 1000 MG tablet Take 1 tablet (1,000 mg total) by mouth 2 (two) times daily with a meal.  180 tablet  3  . misoprostol (CYTOTEC) 200 MCG tablet Take 1 tablet (200 mcg total) by mouth 2 (two) times daily. 1 tab twice a day  180 tablet  3  . niacin (NIASPAN) 1000 MG CR tablet TAKE 2 TABLETS BY MOUTH EVERY NIGHT AT BEDTIME  180 tablet  0  . nitroGLYCERIN (NITROLINGUAL) 0.4 MG/SPRAY spray USE AS DIRECTED FOR CHEST PAIN  12 g  5  . Omega-3 Fatty Acids (FISH  OIL) 1000 MG CAPS Take by mouth daily.        Marland Kitchen omeprazole (PRILOSEC) 20 MG capsule Take 20 mg by mouth daily.        . Potassium Chloride ER (KLOR-CON 10) 20 MEQ TBCR TAKE 1 TABLET BY MOUTH DAILY  30 tablet  5  . QVAR 40 MCG/ACT inhaler INHALE 2 PUFFS BY MOUTH TWICE DAILY  26.1 g  4  . raloxifene (EVISTA) 60 MG tablet TAKE 1 TABLET BY MOUTH EVERY DAY  90 tablet  0  . sitaGLIPtin (JANUVIA) 100 MG tablet Take 1 tablet (100 mg total) by mouth daily.  90 tablet  0  . triamterene-hydrochlorothiazide (DYAZIDE) 37.5-25 MG per capsule Take 1 each (1 capsule total) by mouth every morning.  30 capsule  5  . [DISCONTINUED] potassium chloride (K-DUR,KLOR-CON) 10 MEQ tablet Take 1 tablet (10 mEq total) by mouth daily.  30 tablet  1   No current facility-administered medications on file prior to visit.   Allergies  Allergen Reactions  . Crestor [Rosuvastatin]     Myalgias   . Zetia [Ezetimibe]     mylagia   Immunization History  Administered Date(s) Administered  . Pneumococcal Polysaccharide-23 10/22/2010  . Tdap 09/05/2012   Prior to Admission medications   Medication Sig Start Date End Date Taking? Authorizing Provider  ACCU-CHEK AVIVA PLUS test strip  04/02/12   Historical Provider, MD  ACCU-CHEK FASTCLIX LANCETS MISC  04/02/12   Historical Provider, MD  acetaminophen (TYLENOL) 325 MG tablet Take 325 mg by mouth every 6 (six) hours as needed.     Historical Provider, MD  aspirin 325 MG tablet Take 325 mg by mouth daily.      Historical Provider, MD  beclomethasone (QVAR) 40 MCG/ACT inhaler INHALE 2 PUFFS TWICE DAILY 10/18/12   Ileana Ladd, MD  cholecalciferol (VITAMIN D) 400 UNITS TABS Take by mouth. VITAMIN D3 daily     Historical Provider, MD  cloNIDine (CATAPRES) 0.1 MG tablet Take 0.1 mg by mouth 2 (two) times daily.      Historical Provider, MD  diltiazem (MATZIM LA) 360 MG 24 hr tablet Take 360 mg by mouth daily.      Historical Provider, MD  fenofibrate 54 MG tablet TAKE 1 TABLET BY  MOUTH EVERY DAY 09/24/12   Ileana Ladd, MD  glipiZIDE (GLUCOTROL XL) 5 MG 24 hr tablet TAKE 2 TABLETS BY MOUTH EVERY MORNING AND 1 TABLET EVERY EVENING 12/09/12   Ileana Ladd, MD  ipratropium-albuterol (DUONEB) 0.5-2.5 (3) MG/3ML SOLN USE 1 VIAL IN NEBULIZER EVERY 6 HOURS 12/28/12   Ileana Ladd, MD  IPRATROPIUM-ALBUTEROL IN Inhale into the lungs once a week.     Historical Provider, MD  isosorbide mononitrate (IMDUR) 60 MG 24 hr tablet TAKE 1 TABLET BY MOUTH EVERY MORNING 09/09/12   Ileana Ladd, MD  levothyroxine (SYNTHROID, LEVOTHROID) 125 MCG tablet TAKE ONE TABLET BY MOUTH DAILY 08/12/12   Ileana Ladd, MD  LORazepam (ATIVAN) 0.5 MG tablet Take 1 tablet (0.5 mg total) by mouth 2 (two) times daily as needed for anxiety. 10/18/12   Ileana Ladd, MD  meloxicam (MOBIC) 7.5 MG tablet Take 1 tablet (7.5 mg total) by mouth daily. 10/18/12   Ileana Ladd, MD  metFORMIN (GLUCOPHAGE) 1000 MG tablet Take 1 tablet (1,000 mg total) by mouth 2 (two) times daily with a meal. 12/27/12   Ileana Ladd, MD  misoprostol (CYTOTEC) 200 MCG tablet Take 1 tablet (200 mcg total) by mouth 2 (two) times daily. 1 tab twice a day 05/21/12   Ileana Ladd, MD  niacin (NIASPAN) 1000 MG CR tablet TAKE 2 TABLETS BY MOUTH EVERY NIGHT AT BEDTIME 11/16/12   Ileana Ladd, MD  nitroGLYCERIN (NITROLINGUAL) 0.4 MG/SPRAY spray USE AS DIRECTED FOR CHEST PAIN 12/24/12   Ileana Ladd, MD  Omega-3 Fatty Acids (FISH OIL) 1000 MG CAPS Take by mouth daily.      Historical Provider, MD  omeprazole (PRILOSEC) 20 MG capsule Take 20 mg by mouth daily.      Historical Provider, MD  Potassium Chloride ER (KLOR-CON 10) 20 MEQ TBCR TAKE 1 TABLET BY MOUTH DAILY 09/09/12   Ileana Ladd, MD  pregabalin (LYRICA) 50 MG capsule Take 1 capsule (50 mg total) by mouth 3 (three) times daily. 12/27/12   Ileana Ladd, MD  QVAR 40 MCG/ACT inhaler INHALE 2 PUFFS BY MOUTH TWICE DAILY 01/15/13   Ileana Ladd, MD  raloxifene (EVISTA) 60 MG  tablet TAKE 1 TABLET BY MOUTH EVERY DAY 12/02/12   Ernestina Penna, MD  sitaGLIPtin (JANUVIA) 100 MG tablet Take 1 tablet (100 mg total) by mouth daily. 12/17/12   Mary-Margaret Daphine Deutscher, FNP  triamterene-hydrochlorothiazide (DYAZIDE) 37.5-25 MG per capsule Take 1 each (1 capsule total) by mouth every morning. 10/07/12   Ileana Ladd, MD     ROS: As above in the HPI. All other systems are stable or negative.  OBJECTIVE: APPEARANCE:  Patient in no acute distress.The patient appeared well nourished and normally developed. Acyanotic. Waist: VITAL SIGNS:BP 157/78  Pulse 75  Temp(Src) 97.7 F (36.5 C) (Oral)  Ht  (1.676 m)  Wt 191 lb 12.8 oz (87 kg)  BMI 30.97 kg/m2  WF ambulates with a  cane  SKIN: warm and  Dry without overt rashes, tattoos and scars  HEAD and Neck: without JVD, Head and scalp: normal Eyes:No scleral icterus. Fundi normal, eye movements normal. Ears: Auricle normal, canal normal, Tympanic membranes normal, insufflation normal. Nose: normal Throat: normal Neck & thyroid: normal  CHEST & LUNGS: Chest wall: normal Lungs: Clear  CVS: Reveals the PMI to be normally located. Regular rhythm, First and Second Heart sounds are normal,  absence of murmurs, rubs or gallops. Peripheral vasculature: Radial pulses: normal Dorsal pedis pulses: normal Posterior pulses: normal  ABDOMEN:  Appearance: normal Benign, no organomegaly, no masses, no Abdominal Aortic enlargement. No Guarding , no rebound. No Bruits. Bowel sounds: normal  RECTAL: N/A GU: N/A  EXTREMETIES: nonedematous.  MUSCULOSKELETAL:  Spine: normal Joints: intact  NEUROLOGIC: oriented to time,place and person; nonfocal. No change in peripheral neuropathy.   Results for orders placed in visit on 12/28/12  CMP14+EGFR      Result Value Range   Glucose 113 (*) 65 - 99 mg/dL   BUN 13  8 - 27 mg/dL   Creatinine, Ser 1.610.99  0.57 - 1.00 mg/dL   GFR calc non Af Amer 58 (*) >59 mL/min/1.73    GFR calc Af Amer 67  >59 mL/min/1.73   BUN/Creatinine Ratio 13  11 - 26   Sodium 139  134 - 144 mmol/L   Potassium 4.1  3.5 - 5.2 mmol/L   Chloride 96 (*) 97 - 108 mmol/L   CO2 25  18 - 29 mmol/L   Calcium 10.4 (*) 8.6 - 10.2 mg/dL   Total Protein 7.1  6.0 - 8.5 g/dL   Albumin 4.5  3.6 - 4.8 g/dL   Globulin, Total 2.6  1.5 - 4.5 g/dL   Albumin/Globulin Ratio 1.7  1.1 - 2.5   Total Bilirubin 0.3  0.0 - 1.2 mg/dL   Alkaline Phosphatase 40  39 - 117 IU/L   AST 22  0 - 40 IU/L   ALT 13  0 - 32 IU/L  NMR, LIPOPROFILE      Result Value Range   LDL Particle Number [id number]  <1000 nmol/L   LDLC SERPL CALC-MCNC 80  <100 mg/dL   HDL Cholesterol by NMR 66  >=40 mg/dL   Triglycerides by NMR 43  <150 mg/dL   Cholesterol 096155  <045<200 mg/dL   HDL Particle Number [id number]  >=81.1>=30.5 umol/L   Small LDL Particle Number [id number]  <=527 nmol/L   LDL Size 20.6  >20.5 nm   LP-IR Score < 25  <= 45  TSH      Result Value Range   TSH 0.949  0.450 - 4.500 uIU/mL  POCT GLYCOSYLATED HEMOGLOBIN (HGB A1C)      Result Value Range   Hemoglobin A1C 6.3 %      ASSESSMENT: Type II or unspecified type diabetes mellitus with neurological manifestations, not stated as uncontrolled(250.60) - Plan: pregabalin (LYRICA) 100 MG capsule  Breast screening - Plan: MM Digital Screening  Hypertension  Hyperlipidemia  GERD (gastroesophageal reflux disease)  Coronary artery disease  COPD (chronic obstructive pulmonary disease)  Diabetes mellitus type 2 with neurological manifestations  PLAN:  Orders Placed This Encounter  Procedures  . MM Digital Screening    Standing Status: Future     Number of Occurrences:      Standing Expiration Date: 03/25/2014    Order Specific Question:  Reason for Exam (SYMPTOM  OR DIAGNOSIS REQUIRED)    Answer:  routine    Order Specific Question:  Preferred imaging location?    Answer:  External   Meds ordered this encounter  Medications  . pregabalin (LYRICA) 100 MG capsule    Sig: Take 1  capsule (100 mg total) by mouth 3 (three) times daily.    Dispense:  90 capsule    Refill:  5   Medications Discontinued During This Encounter  Medication Reason  . pregabalin (LYRICA) 50 MG capsule Reorder   Return in about 2 months (around 03/25/2013) for Recheck medical problems.  Emry Tobin P. [place] CharonWong, M.D.

## 2013-02-01 ENCOUNTER — Telehealth: Payer: Self-pay | Admitting: Family Medicine

## 2013-02-10 ENCOUNTER — Other Ambulatory Visit: Payer: Self-pay | Admitting: Family Medicine

## 2013-02-22 ENCOUNTER — Other Ambulatory Visit: Payer: Self-pay | Admitting: Family Medicine

## 2013-03-06 ENCOUNTER — Other Ambulatory Visit: Payer: Self-pay | Admitting: Family Medicine

## 2013-03-12 ENCOUNTER — Other Ambulatory Visit: Payer: Self-pay | Admitting: Family Medicine

## 2013-03-17 ENCOUNTER — Other Ambulatory Visit: Payer: Self-pay | Admitting: Family Medicine

## 2013-03-19 ENCOUNTER — Telehealth: Payer: Self-pay | Admitting: Family Medicine

## 2013-03-19 NOTE — Telephone Encounter (Signed)
Patient needs to be seen.

## 2013-03-20 NOTE — Telephone Encounter (Signed)
PT NOTIFIED NTBS AND SHE SAID SHE WOULD CALL BACK FOR APPT.

## 2013-03-26 ENCOUNTER — Encounter (INDEPENDENT_AMBULATORY_CARE_PROVIDER_SITE_OTHER): Payer: Self-pay

## 2013-03-26 ENCOUNTER — Ambulatory Visit (INDEPENDENT_AMBULATORY_CARE_PROVIDER_SITE_OTHER): Payer: Medicare Other | Admitting: Family Medicine

## 2013-03-26 ENCOUNTER — Encounter: Payer: Self-pay | Admitting: Family Medicine

## 2013-03-26 VITALS — BP 130/72 | HR 70 | Temp 97.2°F | Ht 66.0 in | Wt 194.0 lb

## 2013-03-26 DIAGNOSIS — E785 Hyperlipidemia, unspecified: Secondary | ICD-10-CM

## 2013-03-26 DIAGNOSIS — E1149 Type 2 diabetes mellitus with other diabetic neurological complication: Secondary | ICD-10-CM

## 2013-03-26 DIAGNOSIS — K219 Gastro-esophageal reflux disease without esophagitis: Secondary | ICD-10-CM

## 2013-03-26 DIAGNOSIS — E039 Hypothyroidism, unspecified: Secondary | ICD-10-CM

## 2013-03-26 DIAGNOSIS — R609 Edema, unspecified: Secondary | ICD-10-CM

## 2013-03-26 DIAGNOSIS — J449 Chronic obstructive pulmonary disease, unspecified: Secondary | ICD-10-CM

## 2013-03-26 DIAGNOSIS — M949 Disorder of cartilage, unspecified: Secondary | ICD-10-CM

## 2013-03-26 DIAGNOSIS — J45909 Unspecified asthma, uncomplicated: Secondary | ICD-10-CM

## 2013-03-26 DIAGNOSIS — I10 Essential (primary) hypertension: Secondary | ICD-10-CM

## 2013-03-26 DIAGNOSIS — M899 Disorder of bone, unspecified: Secondary | ICD-10-CM

## 2013-03-26 DIAGNOSIS — J4489 Other specified chronic obstructive pulmonary disease: Secondary | ICD-10-CM

## 2013-03-26 DIAGNOSIS — I2510 Atherosclerotic heart disease of native coronary artery without angina pectoris: Secondary | ICD-10-CM

## 2013-03-26 DIAGNOSIS — M8580 Other specified disorders of bone density and structure, unspecified site: Secondary | ICD-10-CM

## 2013-03-26 MED ORDER — GABAPENTIN 100 MG PO CAPS
100.0000 mg | ORAL_CAPSULE | Freq: Three times a day (TID) | ORAL | Status: DC
Start: 2013-03-26 — End: 2013-07-10

## 2013-03-26 NOTE — Progress Notes (Signed)
Patient ID: Denise Gardner, female   DOB: [birth year], 68 y.o.   MRN: [id number] SUBJECTIVE: CC: Chief Complaint  Patient presents with  . Follow-up    2 month follow up thinks lyrica causing swelling      HPI: Couldn't tolerate the lyrica. She had swelling of her hands and feet. Wants to use gabapentin. She had friends who had good results with lyrica that is why she wanted to use it.  Patient is here for follow up of Diabetes Mellitus/HTN/HLD: Symptoms evaluated: Denies Nocturia ,Denies Urinary Frequency , denies Blurred vision ,deniesDizziness,denies.Dysuria,denies paresthesias, denies extremity pain or ulcers.Marland Kitchendenies chest pain. has had an annual eye exam. do check the feet. Does check CBGs. Average GUY:QIHKVQ Denies episodes of hypoglycemia. Does have an emergency hypoglycemic plan. admits toCompliance with medications. .  Past Medical History  Diagnosis Date  . Asthma   . Thyroid disease   . Neuropathy   . Hypertension   . Chronic back pain   . Shingles   . GERD (gastroesophageal reflux disease)   . Anxiety     depression  . Migraine   . Insomnia   . Coronary artery disease     Taxus stent to RCA 2008 2.5 x 16, non obstructive 15% proximal right coronary artery, patent curcumflex LAD, preserved LV   Past Surgical History  Procedure Laterality Date  . Total abdominal hysterectomy     History   Social History  . Marital Status: Married    Spouse Name: N/A    Number of Children: N/A  . Years of Education: N/A   Occupational History  . retired     Equities trader    Social History Main Topics  . Smoking status: Never Smoker   . Smokeless tobacco: Not on file  . Alcohol Use: No  . Drug Use: No  . Sexual Activity: Not on file   Other Topics Concern  . Not on file   Social History Narrative   Lives with sister taking care of her mother.  Lives here   Family History  Problem Relation Age of Onset  . Stroke Mother 78  . Coronary artery disease Brother 77  .  Coronary artery disease Brother 10  . Cancer Sister     breast   Current Outpatient Prescriptions on File Prior to Visit  Medication Sig Dispense Refill  . ACCU-CHEK AVIVA PLUS test strip       . ACCU-CHEK FASTCLIX LANCETS MISC       . acetaminophen (TYLENOL) 325 MG tablet Take 325 mg by mouth every 6 (six) hours as needed.       Marland Kitchen aspirin 325 MG tablet Take 325 mg by mouth daily.        . beclomethasone (QVAR) 40 MCG/ACT inhaler INHALE 2 PUFFS TWICE DAILY  26.1 g  1  . cholecalciferol (VITAMIN D) 400 UNITS TABS Take by mouth. VITAMIN D3 daily       . cloNIDine (CATAPRES) 0.1 MG tablet TAKE ONE TABLET BY MOUTH TWICE DAILY  180 tablet  0  . fenofibrate 54 MG tablet TAKE 1 TABLET BY MOUTH EVERY DAY  30 tablet  2  . glipiZIDE (GLUCOTROL XL) 5 MG 24 hr tablet TAKE 2 TABLETS BY MOUTH EVERY MORNING& 1 TABLET EVERY EVENING  270 tablet  0  . ipratropium-albuterol (DUONEB) 0.5-2.5 (3) MG/3ML SOLN USE 1 VIAL IN NEBULIZER EVERY 6 HOURS  1080 mL  0  . IPRATROPIUM-ALBUTEROL IN Inhale into the lungs once a week.       Marland Kitchen  isosorbide mononitrate (IMDUR) 60 MG 24 hr tablet TAKE 1 TABLET BY MOUTH EVERY MORNING  90 tablet  0  . JANUVIA 100 MG tablet TAKE 1 TABLET BY MOUTH EVERY DAY  90 tablet  0  . levothyroxine (SYNTHROID, LEVOTHROID) 125 MCG tablet TAKE ONE TABLET BY MOUTH DAILY  90 tablet  2  . LORazepam (ATIVAN) 0.5 MG tablet Take 1 tablet (0.5 mg total) by mouth 2 (two) times daily as needed for anxiety.  10 tablet  0  . MATZIM LA 360 MG 24 hr tablet TAKE 1 TABLET BY MOUTH ONCE DAILY  90 tablet  0  . meloxicam (MOBIC) 7.5 MG tablet Take 1 tablet (7.5 mg total) by mouth daily.  90 tablet  1  . metFORMIN (GLUCOPHAGE) 1000 MG tablet Take 1 tablet (1,000 mg total) by mouth 2 (two) times daily with a meal.  180 tablet  3  . misoprostol (CYTOTEC) 200 MCG tablet Take 1 tablet (200 mcg total) by mouth 2 (two) times daily. 1 tab twice a day  180 tablet  3  . niacin (NIASPAN) 1000 MG CR tablet TAKE 2 TABLETS BY  MOUTH EVERY NIGHT AT BEDTIME  180 tablet  0  . nitroGLYCERIN (NITROLINGUAL) 0.4 MG/SPRAY spray USE AS DIRECTED FOR CHEST PAIN  12 g  5  . Omega-3 Fatty Acids (FISH OIL) 1000 MG CAPS Take by mouth daily.        Marland Kitchen omeprazole (PRILOSEC) 20 MG capsule TAKE 1 CAPSULE BY MOUTH EVERY DAY  90 capsule  0  . potassium chloride SA (K-DUR,KLOR-CON) 20 MEQ tablet TAKE 1 TABLET BY MOUTH ONCE DAILY  30 tablet  4  . QVAR 40 MCG/ACT inhaler INHALE 2 PUFFS BY MOUTH TWICE DAILY  26.1 g  4  . raloxifene (EVISTA) 60 MG tablet TAKE 1 TABLET BY MOUTH EVERY DAY  90 tablet  1  . triamterene-hydrochlorothiazide (DYAZIDE) 37.5-25 MG per capsule TAKE 1 CAPSULE BY MOUTH EVERY MORNING  90 capsule  1   No current facility-administered medications on file prior to visit.   Allergies  Allergen Reactions  . Crestor [Rosuvastatin]     Myalgias   . Zetia [Ezetimibe]     mylagia   Immunization History  Administered Date(s) Administered  . Pneumococcal Polysaccharide-23 10/22/2010  . Tdap 09/05/2012   Prior to Admission medications   Medication Sig Start Date End Date Taking? Authorizing Provider  ACCU-CHEK AVIVA PLUS test strip  04/02/12   Historical Provider, MD  ACCU-CHEK FASTCLIX LANCETS [place]  04/02/12   Historical Provider, MD  acetaminophen (TYLENOL) 325 MG tablet Take 325 mg by mouth every 6 (six) hours as needed.     Historical Provider, MD  aspirin 325 MG tablet Take 325 mg by mouth daily.      Historical Provider, MD  beclomethasone (QVAR) 40 MCG/ACT inhaler INHALE 2 PUFFS TWICE DAILY 10/18/12   Vernie Shanks, MD  cholecalciferol (VITAMIN D) 400 UNITS TABS Take by mouth. VITAMIN D3 daily     Historical Provider, MD  cloNIDine (CATAPRES) 0.1 MG tablet TAKE ONE TABLET BY MOUTH TWICE DAILY 02/10/13   Vernie Shanks, MD  fenofibrate 54 MG tablet TAKE 1 TABLET BY MOUTH EVERY DAY 03/12/13   Mary-Margaret Hassell Done, FNP  glipiZIDE (GLUCOTROL XL) 5 MG 24 hr tablet TAKE 2 TABLETS BY MOUTH EVERY MORNING& 1 TABLET EVERY EVENING  02/10/13   Vernie Shanks, MD  ipratropium-albuterol (DUONEB) 0.5-2.5 (3) MG/3ML SOLN USE 1 VIAL IN NEBULIZER EVERY 6 HOURS 12/28/12  Vernie Shanks, MD  IPRATROPIUM-ALBUTEROL IN Inhale into the lungs once a week.     Historical Provider, MD  isosorbide mononitrate (IMDUR) 60 MG 24 hr tablet TAKE 1 TABLET BY MOUTH EVERY MORNING 02/10/13   Vernie Shanks, MD  JANUVIA 100 MG tablet TAKE 1 TABLET BY MOUTH EVERY DAY 03/17/13   Vernie Shanks, MD  levothyroxine (SYNTHROID, LEVOTHROID) 125 MCG tablet TAKE ONE TABLET BY MOUTH DAILY 08/12/12   Vernie Shanks, MD  LORazepam (ATIVAN) 0.5 MG tablet Take 1 tablet (0.5 mg total) by mouth 2 (two) times daily as needed for anxiety. 10/18/12   Vernie Shanks, MD  MATZIM LA 360 MG 24 hr tablet TAKE 1 TABLET BY MOUTH ONCE DAILY 02/10/13   Vernie Shanks, MD  meloxicam (MOBIC) 7.5 MG tablet Take 1 tablet (7.5 mg total) by mouth daily. 10/18/12   Vernie Shanks, MD  metFORMIN (GLUCOPHAGE) 1000 MG tablet Take 1 tablet (1,000 mg total) by mouth 2 (two) times daily with a meal. 12/27/12   Vernie Shanks, MD  misoprostol (CYTOTEC) 200 MCG tablet Take 1 tablet (200 mcg total) by mouth 2 (two) times daily. 1 tab twice a day 05/21/12   Vernie Shanks, MD  niacin (NIASPAN) 1000 MG CR tablet TAKE 2 TABLETS BY MOUTH EVERY NIGHT AT BEDTIME 11/16/12   Vernie Shanks, MD  nitroGLYCERIN (NITROLINGUAL) 0.4 MG/SPRAY spray USE AS DIRECTED FOR CHEST PAIN 12/24/12   Vernie Shanks, MD  Omega-3 Fatty Acids (FISH OIL) 1000 MG CAPS Take by mouth daily.      Historical Provider, MD  omeprazole (PRILOSEC) 20 MG capsule TAKE 1 CAPSULE BY MOUTH EVERY DAY 03/06/13   Vernie Shanks, MD  potassium chloride SA (K-DUR,KLOR-CON) 20 MEQ tablet TAKE 1 TABLET BY MOUTH ONCE DAILY 02/10/13   Vernie Shanks, MD  pregabalin (LYRICA) 100 MG capsule Take 1 capsule (100 mg total) by mouth 3 (three) times daily. 01/23/13   Vernie Shanks, MD  QVAR 40 MCG/ACT inhaler INHALE 2 PUFFS BY MOUTH TWICE DAILY 01/15/13    Vernie Shanks, MD  raloxifene (EVISTA) 60 MG tablet TAKE 1 TABLET BY MOUTH EVERY DAY 02/22/13   Vernie Shanks, MD  sitaGLIPtin (JANUVIA) 100 MG tablet Take 1 tablet (100 mg total) by mouth daily. 12/17/12   Mary-Margaret Hassell Done, FNP  triamterene-hydrochlorothiazide (DYAZIDE) 37.5-25 MG per capsule TAKE 1 CAPSULE BY MOUTH EVERY MORNING 02/22/13   Vernie Shanks, MD     ROS: As above in the HPI. All other systems are stable or negative.  OBJECTIVE: APPEARANCE:  Patient in no acute distress.The patient appeared well nourished and normally developed. Acyanotic. Waist: VITAL SIGNS:BP 130/72  Pulse 70  Temp(Src) 97.2 F (36.2 C) (Oral)  Ht '5\' 6"'  (1.676 m)  Wt 194 lb (87.998 kg)  BMI 31.33 kg/m2  Obese WF  SKIN: warm and  Dry without overt rashes, tattoos and scars  HEAD and Neck: without JVD, Head and scalp: normal Eyes:No scleral icterus. Fundi normal, eye movements normal. Ears: Auricle normal, canal normal, Tympanic membranes normal, insufflation normal. Nose: normal Throat: normal Neck & thyroid: normal  CHEST & LUNGS: Chest wall: normal Lungs: Clear  CVS: Reveals the PMI to be normally located. Regular rhythm, First and Second Heart sounds are normal,  absence of murmurs, rubs or gallops. Peripheral vasculature: Radial pulses: normal Dorsal pedis pulses: normal Posterior pulses: normal  ABDOMEN:  Appearance: normal Benign, no organomegaly, no masses, no Abdominal Aortic  enlargement. No Guarding , no rebound. No Bruits. Bowel sounds: normal  RECTAL: N/A GU: N/A  EXTREMETIES: nonedematous.  MUSCULOSKELETAL:  Spine: normal Joints: intact  NEUROLOGIC: oriented to time,place and person; nonfocal. No changes  Results for orders placed in visit on 12/28/12  CMP14+EGFR      Result Value Range   Glucose 113 (*) 65 - 99 mg/dL   BUN 13  8 - 27 mg/dL   Creatinine, Ser 0.99  0.57 - 1.00 mg/dL   GFR calc non Af Amer 58 (*) >59 mL/min/1.73   GFR calc Af Amer  67  >59 mL/min/1.73   BUN/Creatinine Ratio 13  11 - 26   Sodium 139  134 - 144 mmol/L   Potassium 4.1  3.5 - 5.2 mmol/L   Chloride 96 (*) 97 - 108 mmol/L   CO2 25  18 - 29 mmol/L   Calcium 10.4 (*) 8.6 - 10.2 mg/dL   Total Protein 7.1  6.0 - 8.5 g/dL   Albumin 4.5  3.6 - 4.8 g/dL   Globulin, Total 2.6  1.5 - 4.5 g/dL   Albumin/Globulin Ratio 1.7  1.1 - 2.5   Total Bilirubin 0.3  0.0 - 1.2 mg/dL   Alkaline Phosphatase 40  39 - 117 IU/L   AST 22  0 - 40 IU/L   ALT 13  0 - 32 IU/L  NMR, LIPOPROFILE      Result Value Range   LDL Particle Number [id number]  <1000 nmol/L   LDLC SERPL CALC-MCNC 80  <100 mg/dL   HDL Cholesterol by NMR 66  >=40 mg/dL   Triglycerides by NMR 43  <150 mg/dL   Cholesterol 155  <200 mg/dL   HDL Particle Number 36.7  >=30.5 umol/L   Small LDL Particle Number 242  <=527 nmol/L   LDL Size 20.6  >20.5 nm   LP-IR Score < 25  <= 45  TSH      Result Value Range   TSH 0.949  0.450 - 4.500 uIU/mL  POCT GLYCOSYLATED HEMOGLOBIN (HGB A1C)      Result Value Range   Hemoglobin A1C 6.3 %      ASSESSMENT: Unspecified asthma  Diabetes mellitus type 2 with neurological manifestations - Plan: gabapentin (NEURONTIN) 100 MG capsule  Edema - resolved with d/c of lyrica  Hypertension  Hyperlipidemia  Unspecified hypothyroidism  Osteopenia  GERD (gastroesophageal reflux disease)  COPD (chronic obstructive pulmonary disease)  Coronary artery disease  PLAN: Reviewed medications. Discussed a slow medication adjustment of gabapentin.   No orders of the defined types were placed in this encounter.   Meds ordered this encounter  Medications  . gabapentin (NEURONTIN) 100 MG capsule    Sig: Take 1 capsule (100 mg total) by mouth 3 (three) times daily.    Dispense:  90 capsule    Refill:  3   Medications Discontinued During This Encounter  Medication Reason  . sitaGLIPtin (JANUVIA) 117 MG tablet Duplicate  . pregabalin (LYRICA) 100 MG capsule Side effect (s)    Return in about 4 weeks (around 04/23/2013) for Recheck medical problems.  Derril Franek P. Jacelyn Grip, M.D.

## 2013-03-30 ENCOUNTER — Other Ambulatory Visit: Payer: Self-pay | Admitting: Family Medicine

## 2013-04-12 ENCOUNTER — Encounter: Payer: Self-pay | Admitting: *Deleted

## 2013-04-17 ENCOUNTER — Other Ambulatory Visit: Payer: Self-pay | Admitting: Family Medicine

## 2013-04-22 ENCOUNTER — Ambulatory Visit (INDEPENDENT_AMBULATORY_CARE_PROVIDER_SITE_OTHER): Payer: Medicare Other | Admitting: Family Medicine

## 2013-04-22 ENCOUNTER — Encounter: Payer: Self-pay | Admitting: Family Medicine

## 2013-04-22 VITALS — BP 145/85 | HR 78 | Temp 97.0°F | Ht 66.0 in | Wt 192.4 lb

## 2013-04-22 DIAGNOSIS — E785 Hyperlipidemia, unspecified: Secondary | ICD-10-CM

## 2013-04-22 DIAGNOSIS — M899 Disorder of bone, unspecified: Secondary | ICD-10-CM

## 2013-04-22 DIAGNOSIS — F411 Generalized anxiety disorder: Secondary | ICD-10-CM

## 2013-04-22 DIAGNOSIS — IMO0001 Reserved for inherently not codable concepts without codable children: Secondary | ICD-10-CM

## 2013-04-22 DIAGNOSIS — K219 Gastro-esophageal reflux disease without esophagitis: Secondary | ICD-10-CM

## 2013-04-22 DIAGNOSIS — I2510 Atherosclerotic heart disease of native coronary artery without angina pectoris: Secondary | ICD-10-CM

## 2013-04-22 DIAGNOSIS — J449 Chronic obstructive pulmonary disease, unspecified: Secondary | ICD-10-CM

## 2013-04-22 DIAGNOSIS — R350 Frequency of micturition: Secondary | ICD-10-CM

## 2013-04-22 DIAGNOSIS — E1149 Type 2 diabetes mellitus with other diabetic neurological complication: Secondary | ICD-10-CM

## 2013-04-22 DIAGNOSIS — E039 Hypothyroidism, unspecified: Secondary | ICD-10-CM

## 2013-04-22 DIAGNOSIS — M8580 Other specified disorders of bone density and structure, unspecified site: Secondary | ICD-10-CM

## 2013-04-22 DIAGNOSIS — M949 Disorder of cartilage, unspecified: Secondary | ICD-10-CM

## 2013-04-22 DIAGNOSIS — J45909 Unspecified asthma, uncomplicated: Secondary | ICD-10-CM

## 2013-04-22 DIAGNOSIS — F419 Anxiety disorder, unspecified: Secondary | ICD-10-CM

## 2013-04-22 DIAGNOSIS — J4489 Other specified chronic obstructive pulmonary disease: Secondary | ICD-10-CM

## 2013-04-22 DIAGNOSIS — I10 Essential (primary) hypertension: Secondary | ICD-10-CM

## 2013-04-22 LAB — POCT URINALYSIS DIPSTICK
Bilirubin, UA: NEGATIVE
Blood, UA: NEGATIVE
Glucose, UA: NEGATIVE
Ketones, UA: NEGATIVE
Leukocytes, UA: NEGATIVE
Nitrite, UA: NEGATIVE
Protein, UA: NEGATIVE
Spec Grav, UA: 1.01
Urobilinogen, UA: NEGATIVE
pH, UA: 5

## 2013-04-22 LAB — POCT UA - MICROSCOPIC ONLY
Bacteria, U Microscopic: NEGATIVE
Casts, Ur, LPF, POC: NEGATIVE
Crystals, Ur, HPF, POC: NEGATIVE
Mucus, UA: NEGATIVE
Yeast, UA: NEGATIVE

## 2013-04-22 LAB — POCT GLYCOSYLATED HEMOGLOBIN (HGB A1C): Hemoglobin A1C: 6.7

## 2013-04-22 NOTE — Progress Notes (Signed)
Patient ID: Denise Gardner, female   DOB: [birth year], 68 y.o.   MRN: [id number] SUBJECTIVE: CC: Chief Complaint  Patient presents with  . Follow-up    4 week ck up urinary frequency and urnary incontinence. wants to try lovaza     HPI: Everything hurts back hurts , hips hurts.arthritis.  Urinary frequency.  Patient is here for follow up of Diabetes Mellitus/HTN/HLD/CAD: Symptoms evaluated: Denies Nocturia ,Denies Urinary Frequency , denies Blurred vision ,deniesDizziness,denies.Dysuria,denies paresthesias, denies extremity pain or ulcers.Marland Kitchendenies chest pain. has had an annual eye exam. do check the feet. Does check CBGs. Average QAS:[id number] Denies episodes of hypoglycemia. Does have an emergency hypoglycemic plan. admits toCompliance with medications. Denies Problems with medications.  BP 130/80 at home  Past Medical History  Diagnosis Date  . Asthma   . Thyroid disease   . Neuropathy   . Hypertension   . Chronic back pain   . Shingles   . GERD (gastroesophageal reflux disease)   . Anxiety     depression  . Migraine   . Insomnia   . Coronary artery disease     Taxus stent to RCA 2008 2.5 x 16, non obstructive 15% proximal right coronary artery, patent curcumflex LAD, preserved LV   Past Surgical History  Procedure Laterality Date  . Total abdominal hysterectomy     History   Social History  . Marital Status: Married    Spouse Name: N/A    Number of Children: N/A  . Years of Education: N/A   Occupational History  . retired     Equities trader    Social History Main Topics  . Smoking status: Never Smoker   . Smokeless tobacco: Not on file  . Alcohol Use: No  . Drug Use: No  . Sexual Activity: Not on file   Other Topics Concern  . Not on file   Social History Narrative   Lives with sister taking care of her mother.  Lives here   Family History  Problem Relation Age of Onset  . Stroke Mother 70  . Coronary artery disease Brother 33  . Coronary artery  disease Brother 48  . Cancer Sister     breast   Current Outpatient Prescriptions on File Prior to Visit  Medication Sig Dispense Refill  . ACCU-CHEK AVIVA PLUS test strip       . ACCU-CHEK FASTCLIX LANCETS MISC       . acetaminophen (TYLENOL) 325 MG tablet Take 325 mg by mouth every 6 (six) hours as needed.       Marland Kitchen aspirin 325 MG tablet Take 325 mg by mouth daily.        . cholecalciferol (VITAMIN D) 400 UNITS TABS Take by mouth. VITAMIN D3 daily       . cloNIDine (CATAPRES) 0.1 MG tablet TAKE ONE TABLET BY MOUTH TWICE DAILY  180 tablet  0  . fenofibrate 54 MG tablet TAKE 1 TABLET BY MOUTH EVERY DAY  30 tablet  2  . gabapentin (NEURONTIN) 100 MG capsule Take 1 capsule (100 mg total) by mouth 3 (three) times daily.  90 capsule  3  . glipiZIDE (GLUCOTROL XL) 5 MG 24 hr tablet TAKE 2 TABLETS BY MOUTH EVERY MORNING& 1 TABLET EVERY EVENING  270 tablet  0  . ipratropium-albuterol (DUONEB) 0.5-2.5 (3) MG/3ML SOLN USE 1 VIAL IN NEBULIZER EVERY 6 HOURS  1080 mL  5  . IPRATROPIUM-ALBUTEROL IN Inhale into the lungs once a week.       Marland Kitchen  isosorbide mononitrate (IMDUR) 60 MG 24 hr tablet TAKE 1 TABLET BY MOUTH EVERY MORNING  90 tablet  0  . JANUVIA 100 MG tablet TAKE 1 TABLET BY MOUTH EVERY DAY  90 tablet  0  . levothyroxine (SYNTHROID, LEVOTHROID) 125 MCG tablet TAKE ONE TABLET BY MOUTH DAILY  90 tablet  2  . LORazepam (ATIVAN) 0.5 MG tablet Take 1 tablet (0.5 mg total) by mouth 2 (two) times daily as needed for anxiety.  10 tablet  0  . MATZIM LA 360 MG 24 hr tablet TAKE 1 TABLET BY MOUTH ONCE DAILY  90 tablet  0  . meloxicam (MOBIC) 7.5 MG tablet Take 1 tablet (7.5 mg total) by mouth daily.  90 tablet  1  . metFORMIN (GLUCOPHAGE) 1000 MG tablet Take 1 tablet (1,000 mg total) by mouth 2 (two) times daily with a meal.  180 tablet  3  . misoprostol (CYTOTEC) 200 MCG tablet Take 1 tablet (200 mcg total) by mouth 2 (two) times daily. 1 tab twice a day  180 tablet  3  . niacin (NIASPAN) 1000 MG CR tablet  TAKE 2 TABLETS BY MOUTH EVERY NIGHT AT BEDTIME  180 tablet  0  . nitroGLYCERIN (NITROLINGUAL) 0.4 MG/SPRAY spray USE AS DIRECTED FOR CHEST PAIN  12 g  5  . Omega-3 Fatty Acids (FISH OIL) 1000 MG CAPS Take by mouth daily.        Marland Kitchen omeprazole (PRILOSEC) 20 MG capsule TAKE 1 CAPSULE BY MOUTH EVERY DAY  90 capsule  0  . potassium chloride SA (K-DUR,KLOR-CON) 20 MEQ tablet TAKE 1 TABLET BY MOUTH ONCE DAILY  30 tablet  4  . QVAR 40 MCG/ACT inhaler INHALE 2 PUFFS BY MOUTH TWICE DAILY  26.1 g  4  . raloxifene (EVISTA) 60 MG tablet TAKE 1 TABLET BY MOUTH EVERY DAY  90 tablet  1  . triamterene-hydrochlorothiazide (DYAZIDE) 37.5-25 MG per capsule TAKE 1 CAPSULE BY MOUTH EVERY MORNING  90 capsule  1   No current facility-administered medications on file prior to visit.   Allergies  Allergen Reactions  . Crestor [Rosuvastatin]     Myalgias   . Zetia [Ezetimibe]     mylagia   Immunization History  Administered Date(s) Administered  . Pneumococcal Polysaccharide-23 10/22/2010  . Tdap 09/05/2012   Prior to Admission medications   Medication Sig Start Date End Date Taking? Authorizing Provider  ACCU-CHEK AVIVA PLUS test strip  04/02/12   Historical Provider, MD  ACCU-CHEK FASTCLIX LANCETS [place]  04/02/12   Historical Provider, MD  acetaminophen (TYLENOL) 325 MG tablet Take 325 mg by mouth every 6 (six) hours as needed.     Historical Provider, MD  aspirin 325 MG tablet Take 325 mg by mouth daily.      Historical Provider, MD  beclomethasone (QVAR) 40 MCG/ACT inhaler INHALE 2 PUFFS TWICE DAILY 10/18/12   Vernie Shanks, MD  cholecalciferol (VITAMIN D) 400 UNITS TABS Take by mouth. VITAMIN D3 daily     Historical Provider, MD  cloNIDine (CATAPRES) 0.1 MG tablet TAKE ONE TABLET BY MOUTH TWICE DAILY 02/10/13   Vernie Shanks, MD  fenofibrate 54 MG tablet TAKE 1 TABLET BY MOUTH EVERY DAY 03/12/13   Mary-Margaret Hassell Done, FNP  gabapentin (NEURONTIN) 100 MG capsule Take 1 capsule (100 mg total) by mouth 3 (three)  times daily. 03/26/13   Vernie Shanks, MD  glipiZIDE (GLUCOTROL XL) 5 MG 24 hr tablet TAKE 2 TABLETS BY MOUTH EVERY MORNING& 1 TABLET EVERY  EVENING 02/10/13   Vernie Shanks, MD  ipratropium-albuterol (DUONEB) 0.5-2.5 (3) MG/3ML SOLN USE 1 VIAL IN NEBULIZER EVERY 6 HOURS 03/30/13   Chipper Herb, MD  IPRATROPIUM-ALBUTEROL IN Inhale into the lungs once a week.     Historical Provider, MD  isosorbide mononitrate (IMDUR) 60 MG 24 hr tablet TAKE 1 TABLET BY MOUTH EVERY MORNING 02/10/13   Vernie Shanks, MD  JANUVIA 100 MG tablet TAKE 1 TABLET BY MOUTH EVERY DAY 03/17/13   Vernie Shanks, MD  levothyroxine (SYNTHROID, LEVOTHROID) 125 MCG tablet TAKE ONE TABLET BY MOUTH DAILY 08/12/12   Vernie Shanks, MD  LORazepam (ATIVAN) 0.5 MG tablet Take 1 tablet (0.5 mg total) by mouth 2 (two) times daily as needed for anxiety. 10/18/12   Vernie Shanks, MD  MATZIM LA 360 MG 24 hr tablet TAKE 1 TABLET BY MOUTH ONCE DAILY 02/10/13   Vernie Shanks, MD  meloxicam (MOBIC) 7.5 MG tablet Take 1 tablet (7.5 mg total) by mouth daily. 10/18/12   Vernie Shanks, MD  metFORMIN (GLUCOPHAGE) 1000 MG tablet Take 1 tablet (1,000 mg total) by mouth 2 (two) times daily with a meal. 12/27/12   Vernie Shanks, MD  misoprostol (CYTOTEC) 200 MCG tablet Take 1 tablet (200 mcg total) by mouth 2 (two) times daily. 1 tab twice a day 05/21/12   Vernie Shanks, MD  niacin (NIASPAN) 1000 MG CR tablet TAKE 2 TABLETS BY MOUTH EVERY NIGHT AT BEDTIME    Vernie Shanks, MD  nitroGLYCERIN (NITROLINGUAL) 0.4 MG/SPRAY spray USE AS DIRECTED FOR CHEST PAIN 12/24/12   Vernie Shanks, MD  Omega-3 Fatty Acids (FISH OIL) 1000 MG CAPS Take by mouth daily.      Historical Provider, MD  omeprazole (PRILOSEC) 20 MG capsule TAKE 1 CAPSULE BY MOUTH EVERY DAY 03/06/13   Vernie Shanks, MD  potassium chloride SA (K-DUR,KLOR-CON) 20 MEQ tablet TAKE 1 TABLET BY MOUTH ONCE DAILY 02/10/13   Vernie Shanks, MD  QVAR 40 MCG/ACT inhaler INHALE 2 PUFFS BY MOUTH TWICE DAILY  01/15/13   Vernie Shanks, MD  raloxifene (EVISTA) 60 MG tablet TAKE 1 TABLET BY MOUTH EVERY DAY 02/22/13   Vernie Shanks, MD  triamterene-hydrochlorothiazide (DYAZIDE) 37.5-25 MG per capsule TAKE 1 CAPSULE BY MOUTH EVERY MORNING 02/22/13   Vernie Shanks, MD     ROS: As above in the HPI. All other systems are stable or negative.  OBJECTIVE: APPEARANCE:  Patient in no acute distress.The patient appeared well nourished and normally developed. Acyanotic. Waist: VITAL SIGNS:BP 145/85  Pulse 78  Temp(Src) 97 F (36.1 C) (Oral)  Ht '5\' 6"'  (1.676 m)  Wt 192 lb 6.4 oz (87.272 kg)  BMI 31.07 kg/m2  WF obese  SKIN: warm and  Dry without overt rashes, tattoos and scars  HEAD and Neck: without JVD, Head and scalp: normal Eyes:No scleral icterus. Fundi normal, eye movements normal. Ears: Auricle normal, canal normal, Tympanic membranes normal, insufflation normal. Nose: normal Throat: normal Neck & thyroid: normal  CHEST & LUNGS: Chest wall: normal Lungs: Clear  CVS: Reveals the PMI to be normally located. Regular rhythm, First and Second Heart sounds are normal,  absence of murmurs, rubs or gallops. Peripheral vasculature: Radial pulses: normal Dorsal pedis pulses: normal Posterior pulses: normal  ABDOMEN:  Appearance: normal Benign, no organomegaly, no masses, no Abdominal Aortic enlargement. No Guarding , no rebound. No Bruits. Bowel sounds: normal  RECTAL: N/A GU: N/A  EXTREMETIES:  nonedematous.  MUSCULOSKELETAL:  Spine: normal Joints: intact  NEUROLOGIC: oriented to time,place and person; nonfocal. Strength is normal Sensory is abnormal Burning at the tips of the big toes. Reflexes are normal Cranial Nerves are normal.  ASSESSMENT:  Frequency - Plan: POCT UA - Microscopic Only, POCT urinalysis dipstick, Urine culture  Hypertension - Plan: CMP14+EGFR  Hyperlipidemia - Plan: CMP14+EGFR, Lipid panel  Diabetes mellitus type 2 with neurological  manifestations - Plan: CMP14+EGFR, POCT glycosylated hemoglobin (Hb A1C)  Coronary artery disease  COPD (chronic obstructive pulmonary disease)  Unspecified asthma  Osteopenia  GERD (gastroesophageal reflux disease)  Unspecified hypothyroidism - Plan: TSH  Anxiety  PLAN:      Dr Paula Libra Recommendations  For nutrition information, I recommend books:  1).Eat to Live by Dr Excell Seltzer. 2).Prevent and Reverse Heart Disease by Dr Karl Luke. 3) Dr Janene Harvey Book:  Program to Reverse Diabetes  Exercise recommendations are:  If unable to walk, then the patient can exercise in a chair 3 times a day. By flapping arms like a bird gently and raising legs outwards to the front.  If ambulatory, the patient can go for walks for 30 minutes 3 times a week. Then increase the intensity and duration as tolerated.  Goal is to try to attain exercise frequency to 5 times a week.  If applicable: Best to perform resistance exercises (machines or weights) 2 days a week and cardio type exercises 3 days per week.  Orders Placed This Encounter  Procedures  . Urine culture  . CMP14+EGFR  . Lipid panel  . TSH  . POCT UA - Microscopic Only  . POCT urinalysis dipstick  . POCT glycosylated hemoglobin (Hb A1C)  recommend adjusting anti-htn but patient declines. She says that the BP is normal at home Also, she does not want any other interventions except to follow her brother and be on Lovaza. Her last lipids were at goal. Would not change her medications at this time. Discussed with her her risks. DM foot care discussed. Compliance discussed.   No orders of the defined types were placed in this encounter.   Medications Discontinued During This Encounter  Medication Reason  . beclomethasone (QVAR) 40 MCG/ACT inhaler Duplicate   Return in about 3 months (around 07/20/2013) for Recheck medical problems.  Tenille Morrill P. Jacelyn Grip, M.D.

## 2013-04-22 NOTE — Patient Instructions (Signed)
    Dr Kara Mierzejewski's Recommendations  For nutrition information, I recommend books:  1).Eat to Live by Dr Joel Fuhrman. 2).Prevent and Reverse Heart Disease by Dr Caldwell Esselstyn. 3) Dr Neal Barnard's Book:  Program to Reverse Diabetes  Exercise recommendations are:  If unable to walk, then the patient can exercise in a chair 3 times a day. By flapping arms like a bird gently and raising legs outwards to the front.  If ambulatory, the patient can go for walks for 30 minutes 3 times a week. Then increase the intensity and duration as tolerated.  Goal is to try to attain exercise frequency to 5 times a week.  If applicable: Best to perform resistance exercises (machines or weights) 2 days a week and cardio type exercises 3 days per week.   Diabetes and Foot Care Diabetes may cause you to have problems because of poor blood supply (circulation) to your feet and legs. This may cause the skin on your feet to become thinner, break easier, and heal more slowly. Your skin may become dry, and the skin may peel and crack. You may also have nerve damage in your legs and feet causing decreased feeling in them. You may not notice minor injuries to your feet that could lead to infections or more serious problems. Taking care of your feet is one of the most important things you can do for yourself.  HOME CARE INSTRUCTIONS  Wear shoes at all times, even in the house. Do not go barefoot. Bare feet are easily injured.  Check your feet daily for blisters, cuts, and redness. If you cannot see the bottom of your feet, use a mirror or ask someone for help.  Wash your feet with warm water (do not use hot water) and mild soap. Then pat your feet and the areas between your toes until they are completely dry. Do not soak your feet as this can dry your skin.  Apply a moisturizing lotion or petroleum jelly (that does not contain alcohol and is unscented) to the skin on your feet and to dry, brittle toenails.  Do not apply lotion between your toes.  Trim your toenails straight across. Do not dig under them or around the cuticle. File the edges of your nails with an emery board or nail file.  Do not cut corns or calluses or try to remove them with medicine.  Wear clean socks or stockings every day. Make sure they are not too tight. Do not wear knee-high stockings since they may decrease blood flow to your legs.  Wear shoes that fit properly and have enough cushioning. To break in new shoes, wear them for just a few hours a day. This prevents you from injuring your feet. Always look in your shoes before you put them on to be sure there are no objects inside.  Do not cross your legs. This may decrease the blood flow to your feet.  If you find a minor scrape, cut, or break in the skin on your feet, keep it and the skin around it clean and dry. These areas may be cleansed with mild soap and water. Do not cleanse the area with peroxide, alcohol, or iodine.  When you remove an adhesive bandage, be sure not to damage the skin around it.  If you have a wound, look at it several times a day to make sure it is healing.  Do not use heating pads or hot water bottles. They may burn your skin. If   you have lost feeling in your feet or legs, you may not know it is happening until it is too late.  Make sure your health care provider performs a complete foot exam at least annually or more often if you have foot problems. Report any cuts, sores, or bruises to your health care provider immediately. SEEK MEDICAL CARE IF:   You have an injury that is not healing.  You have cuts or breaks in the skin.  You have an ingrown nail.  You notice redness on your legs or feet.  You feel burning or tingling in your legs or feet.  You have pain or cramps in your legs and feet.  Your legs or feet are numb.  Your feet always feel cold. SEEK IMMEDIATE MEDICAL CARE IF:   There is increasing redness, swelling, or pain in  or around a wound.  There is a red line that goes up your leg.  Pus is coming from a wound.  You develop a fever or as directed by your health care provider.  You notice a bad smell coming from an ulcer or wound. Document Released: 02/12/2000 Document Revised: 10/17/2012 Document Reviewed: 07/24/2012 [employer] Patient Information 2014 [employer].  

## 2013-04-23 ENCOUNTER — Ambulatory Visit: Payer: Medicare Other | Admitting: Family Medicine

## 2013-04-23 LAB — CMP14+EGFR
ALT: 10 IU/L (ref 0–32)
AST: 20 IU/L (ref 0–40)
Albumin/Globulin Ratio: 1.5 (ref 1.1–2.5)
Albumin: 4.4 g/dL (ref 3.6–4.8)
Alkaline Phosphatase: 49 IU/L (ref 39–117)
BUN/Creatinine Ratio: 13 (ref 11–26)
BUN: 14 mg/dL (ref 8–27)
CO2: 26 mmol/L (ref 18–29)
Calcium: 10.3 mg/dL (ref 8.7–10.3)
Chloride: 96 mmol/L — ABNORMAL LOW (ref 97–108)
Creatinine, Ser: 1.1 mg/dL — ABNORMAL HIGH (ref 0.57–1.00)
GFR calc Af Amer: 59 mL/min/{1.73_m2} — ABNORMAL LOW (ref >59)
GFR calc non Af Amer: 51 mL/min/{1.73_m2} — ABNORMAL LOW (ref >59)
Globulin, Total: 2.9 g/dL (ref 1.5–4.5)
Glucose: 150 mg/dL — ABNORMAL HIGH (ref 65–99)
Potassium: 3.6 mmol/L (ref 3.5–5.2)
Sodium: 141 mmol/L (ref 134–144)
Total Bilirubin: 0.3 mg/dL (ref 0.0–1.2)
Total Protein: 7.3 g/dL (ref 6.0–8.5)

## 2013-04-23 LAB — LIPID PANEL
Chol/HDL Ratio: 2 ratio units (ref 0.0–4.4)
Cholesterol, Total: 188 mg/dL (ref 100–199)
HDL: 93 mg/dL (ref >39)
LDL Calculated: 85 mg/dL (ref 0–99)
Triglycerides: 52 mg/dL (ref 0–149)
VLDL Cholesterol Cal: 10 mg/dL (ref 5–40)

## 2013-04-23 LAB — TSH: TSH: 1.21 u[IU]/mL (ref 0.450–4.500)

## 2013-04-24 LAB — URINE CULTURE

## 2013-04-26 ENCOUNTER — Telehealth: Payer: Self-pay | Admitting: *Deleted

## 2013-04-26 NOTE — Telephone Encounter (Signed)
Labs are close to goal. A1c was 6.7 , prefer it to be 6.4.  Watch diet and exercise more to improve this.

## 2013-04-26 NOTE — Telephone Encounter (Signed)
Message copied by Shelbie Ammons on Fri Apr 26, 2013 12:56 PM ------      Message from: Vernie Shanks      Created: Thu Apr 25, 2013 10:37 PM       Call Patient      Lab result at or close to goal. HGBA1C was 6.7 prefer to be 6.4, diet and exercise will achieve this.      No change in Medications for now.      No Change in recommendations.      No change in plans for follow up. ------

## 2013-05-11 ENCOUNTER — Other Ambulatory Visit: Payer: Self-pay | Admitting: Family Medicine

## 2013-06-07 ENCOUNTER — Other Ambulatory Visit: Payer: Self-pay | Admitting: Family Medicine

## 2013-06-10 ENCOUNTER — Other Ambulatory Visit: Payer: Self-pay | Admitting: Nurse Practitioner

## 2013-06-29 ENCOUNTER — Other Ambulatory Visit: Payer: Self-pay | Admitting: Family Medicine

## 2013-07-02 ENCOUNTER — Other Ambulatory Visit: Payer: Self-pay | Admitting: Family Medicine

## 2013-07-10 ENCOUNTER — Other Ambulatory Visit: Payer: Self-pay | Admitting: *Deleted

## 2013-07-10 DIAGNOSIS — E1149 Type 2 diabetes mellitus with other diabetic neurological complication: Secondary | ICD-10-CM

## 2013-07-10 MED ORDER — GABAPENTIN 100 MG PO CAPS: 100.0000 mg | ORAL_CAPSULE | Freq: Three times a day (TID) | ORAL | Status: DC

## 2013-07-17 ENCOUNTER — Other Ambulatory Visit: Payer: Self-pay | Admitting: *Deleted

## 2013-07-17 MED ORDER — NIACIN ER (ANTIHYPERLIPIDEMIC) 1000 MG PO TBCR: EXTENDED_RELEASE_TABLET | ORAL | Status: DC

## 2013-08-06 ENCOUNTER — Ambulatory Visit (INDEPENDENT_AMBULATORY_CARE_PROVIDER_SITE_OTHER): Payer: Medicare Other | Admitting: Family Medicine

## 2013-08-06 ENCOUNTER — Encounter: Payer: Self-pay | Admitting: Family Medicine

## 2013-08-06 VITALS — BP 137/78 | HR 82 | Temp 98.6°F | Ht 66.0 in | Wt 197.6 lb

## 2013-08-06 DIAGNOSIS — E785 Hyperlipidemia, unspecified: Secondary | ICD-10-CM

## 2013-08-06 DIAGNOSIS — R32 Unspecified urinary incontinence: Secondary | ICD-10-CM

## 2013-08-06 DIAGNOSIS — R51 Headache: Secondary | ICD-10-CM

## 2013-08-06 MED ORDER — MIRABEGRON ER 25 MG PO TB24: 25.0000 mg | ORAL_TABLET | Freq: Every day | ORAL | Status: DC

## 2013-08-06 MED ORDER — ICOSAPENT ETHYL 1 G PO CAPS: ORAL_CAPSULE | ORAL | Status: DC

## 2013-08-06 MED ORDER — BUTALBITAL-APAP-CAFFEINE 50-325-40 MG PO TABS: 1.0000 | ORAL_TABLET | Freq: Four times a day (QID) | ORAL | Status: AC | PRN

## 2013-08-06 NOTE — Progress Notes (Signed)
   Subjective:    Patient ID: Denise Gardner, female    DOB: 1946/02/02, 68 y.o.   MRN: [id number]  HPI This 68 y.o. female presents for evaluation of arthralgias.  She states she was having severe joint aches making adl's difficult and she stopped her statin med and she is now better.  She is having headache.  She is also having some stress incontinence.   Review of Systems C/o aches, urinary incontinence No chest pain, SOB, HA, dizziness, vision change, N/V, diarrhea, constipation, dysuria, urinary urgency or frequency, myalgias, arthralgias or rash.     Objective:   Physical Exam  Vital signs noted  Well developed well nourished female.  HEENT - Head atraumatic Normocephalic                Eyes - PERRLA, Conjuctiva - clear Sclera- Clear EOMI                Ears - EAC's Wnl TM's Wnl Gross Hearing WNL                Nose - Nares patent                 Throat - oropharanx wnl Respiratory - Lungs CTA bilateral Cardiac - RRR S1 and S2 without murmur GI - Abdomen soft Nontender and bowel sounds active x 4 Extremities - No edema. Neuro - Grossly intact.      Assessment & Plan:  Other and unspecified hyperlipidemia - Plan: Icosapent Ethyl (VASCEPA) 1 G CAPS  Urinary incontinence - Plan: mirabegron ER (MYRBETRIQ) 25 MG TB24 tablet  Headache(784.0) - Plan: butalbital-acetaminophen-caffeine (FIORICET) 50-325-40 MG per tablet  Lysbeth Penner FNP

## 2013-08-08 ENCOUNTER — Other Ambulatory Visit: Payer: Self-pay | Admitting: Family Medicine

## 2013-08-13 ENCOUNTER — Other Ambulatory Visit: Payer: Self-pay | Admitting: *Deleted

## 2013-08-13 MED ORDER — RALOXIFENE HCL 60 MG PO TABS: 60.0000 mg | ORAL_TABLET | Freq: Every day | ORAL | Status: DC

## 2013-08-14 ENCOUNTER — Other Ambulatory Visit: Payer: Self-pay | Admitting: Family Medicine

## 2013-08-14 ENCOUNTER — Telehealth: Payer: Self-pay | Admitting: *Deleted

## 2013-08-14 NOTE — Telephone Encounter (Signed)
Bill, butalbital/acetaminphen/caffeine/codeine has been denied  Because the use is not supported by the FDA or one of the medicare approved references for treating this medical condition. (headaches). Where to from here.

## 2013-08-26 ENCOUNTER — Other Ambulatory Visit: Payer: Self-pay | Admitting: Family Medicine

## 2013-09-03 ENCOUNTER — Other Ambulatory Visit: Payer: Self-pay | Admitting: Family Medicine

## 2013-09-05 ENCOUNTER — Other Ambulatory Visit: Payer: Self-pay

## 2013-09-05 MED ORDER — SITAGLIPTIN PHOSPHATE 100 MG PO TABS: ORAL_TABLET | ORAL | Status: DC

## 2013-09-05 NOTE — Telephone Encounter (Signed)
No further word from pt , was told to let us know if headaches continue may not need new rx

## 2013-09-09 ENCOUNTER — Other Ambulatory Visit: Payer: Self-pay | Admitting: *Deleted

## 2013-09-09 MED ORDER — MISOPROSTOL 200 MCG PO TABS: ORAL_TABLET | ORAL | Status: DC

## 2013-09-16 ENCOUNTER — Encounter: Payer: Self-pay | Admitting: Family Medicine

## 2013-09-16 ENCOUNTER — Ambulatory Visit (INDEPENDENT_AMBULATORY_CARE_PROVIDER_SITE_OTHER): Payer: Medicare Other | Admitting: Family Medicine

## 2013-09-16 VITALS — BP 154/69 | HR 86 | Temp 98.2°F | Ht 66.0 in | Wt 193.8 lb

## 2013-09-16 DIAGNOSIS — K2100 Gastro-esophageal reflux disease with esophagitis, without bleeding: Secondary | ICD-10-CM

## 2013-09-16 DIAGNOSIS — E1165 Type 2 diabetes mellitus with hyperglycemia: Principal | ICD-10-CM

## 2013-09-16 DIAGNOSIS — IMO0001 Reserved for inherently not codable concepts without codable children: Secondary | ICD-10-CM

## 2013-09-16 LAB — POCT GLYCOSYLATED HEMOGLOBIN (HGB A1C): Hemoglobin A1C: 6.9

## 2013-09-16 LAB — POCT CBC
Granulocyte percent: 58.2 %G (ref 37–80)
HCT, POC: 38.8 % (ref 37.7–47.9)
Hemoglobin: 12.2 g/dL (ref 12.2–16.2)
Lymph, poc: 2.1 (ref 0.6–3.4)
MCH, POC: 27 pg (ref 27–31.2)
MCHC: 31.4 g/dL — AB (ref 31.8–35.4)
MCV: 86.1 fL (ref 80–97)
MPV: 7 fL (ref 0–99.8)
POC Granulocyte: 3.7 (ref 2–6.9)
POC LYMPH PERCENT: 32.7 %L (ref 10–50)
Platelet Count, POC: 258 10*3/uL (ref 142–424)
RBC: 4.5 M/uL (ref 4.04–5.48)
RDW, POC: 14.7 %
WBC: 6.4 10*3/uL (ref 4.6–10.2)

## 2013-09-16 MED ORDER — OMEPRAZOLE 20 MG PO CPDR: 20.0000 mg | DELAYED_RELEASE_CAPSULE | Freq: Two times a day (BID) | ORAL | Status: DC

## 2013-09-16 MED ORDER — MISOPROSTOL 200 MCG PO TABS: ORAL_TABLET | ORAL | Status: DC

## 2013-09-16 NOTE — Progress Notes (Signed)
   Subjective:    Patient ID: Denise Gardner, female    DOB: 16-Jul-1945, 68 y.o.   MRN: [id number]  HPI This 68 y.o. female presents for evaluation of routine follow up.  She has hx of T2DM. She has hx of GERD and has been having increased GERD sx's.  She has hx of hypertension.   Review of Systems C/o GERD No chest pain, SOB, HA, dizziness, vision change, N/V, diarrhea, constipation, dysuria, urinary urgency or frequency, myalgias, arthralgias or rash.     Objective:   Physical Exam Vital signs noted  Well developed well nourished female.  HEENT - Head atraumatic Normocephalic                Eyes - PERRLA, Conjuctiva - clear Sclera- Clear EOMI                Ears - EAC's Wnl TM's Wnl Gross Hearing WNL                Nose - Nares patent                 Throat - oropharanx wnl Respiratory - Lungs CTA bilateral Cardiac - RRR S1 and S2 without murmur GI - Abdomen soft Nontender and bowel sounds active x 4 Extremities - No edema. Neuro - Grossly intact. Feet - Skin intact and no ulcers or calluses.  She has an ant bite and vesicle on her left 2nd and third toe webbing.      Assessment & Plan:  Type II or unspecified type diabetes mellitus without mention of complication, uncontrolled - Plan: POCT CBC, CMP14+EGFR, POCT glycosylated hemoglobin (Hb A1C), TSH. Discussed to leave the ant bite alone on her toe and if not better then follow up.  Discussed possible Referral to Podiatry for her toenails to be trimmed but she wants to wait.  Gastroesophageal reflux disease with esophagitis - Plan: misoprostol (CYTOTEC) 200 MCG tablet, omeprazole (PRILOSEC) 20 MG capsule bid.  Increase of prilosec to bid and if still having GERD Sx's then follow up  HTN - controlled.  Repeat bp 134/70.  Lysbeth Penner FNP

## 2013-09-17 LAB — CMP14+EGFR
ALT: 15 IU/L (ref 0–32)
AST: 20 IU/L (ref 0–40)
Albumin/Globulin Ratio: 1.7 (ref 1.1–2.5)
Albumin: 4.5 g/dL (ref 3.5–4.8)
Alkaline Phosphatase: 49 IU/L (ref 39–117)
BUN/Creatinine Ratio: 12 (ref 11–26)
BUN: 13 mg/dL (ref 8–27)
CO2: 26 mmol/L (ref 18–29)
Calcium: 10.1 mg/dL (ref 8.7–10.3)
Chloride: 95 mmol/L — ABNORMAL LOW (ref 97–108)
Creatinine, Ser: 1.05 mg/dL — ABNORMAL HIGH (ref 0.57–1.00)
GFR calc Af Amer: 62 mL/min/{1.73_m2} (ref >59)
GFR calc non Af Amer: 54 mL/min/{1.73_m2} — ABNORMAL LOW (ref >59)
Globulin, Total: 2.7 g/dL (ref 1.5–4.5)
Glucose: 170 mg/dL — ABNORMAL HIGH (ref 65–99)
Potassium: 3.9 mmol/L (ref 3.5–5.2)
Sodium: 139 mmol/L (ref 134–144)
Total Bilirubin: 0.3 mg/dL (ref 0.0–1.2)
Total Protein: 7.2 g/dL (ref 6.0–8.5)

## 2013-09-17 LAB — TSH: TSH: 0.871 u[IU]/mL (ref 0.450–4.500)

## 2013-09-20 ENCOUNTER — Ambulatory Visit: Payer: Medicare Other | Admitting: Family Medicine

## 2013-09-30 ENCOUNTER — Other Ambulatory Visit: Payer: Self-pay | Admitting: *Deleted

## 2013-09-30 MED ORDER — TRIAMTERENE-HCTZ 37.5-25 MG PO CAPS: ORAL_CAPSULE | ORAL | Status: DC

## 2013-10-01 ENCOUNTER — Other Ambulatory Visit: Payer: Self-pay | Admitting: *Deleted

## 2013-10-01 MED ORDER — GLIPIZIDE ER 5 MG PO TB24: ORAL_TABLET | ORAL | Status: DC

## 2013-10-30 ENCOUNTER — Other Ambulatory Visit: Payer: Self-pay | Admitting: Family Medicine

## 2013-11-01 ENCOUNTER — Telehealth: Payer: Self-pay | Admitting: Family Medicine

## 2013-11-05 NOTE — Telephone Encounter (Signed)
Pt given number to call [facility] to schedule mamogram

## 2013-11-06 ENCOUNTER — Other Ambulatory Visit: Payer: Self-pay | Admitting: *Deleted

## 2013-11-06 MED ORDER — CLONIDINE HCL 0.1 MG PO TABS: ORAL_TABLET | ORAL | Status: DC

## 2013-11-06 MED ORDER — ISOSORBIDE MONONITRATE ER 60 MG PO TB24: ORAL_TABLET | ORAL | Status: DC

## 2013-11-06 NOTE — Telephone Encounter (Signed)
Last ov 7/15. 

## 2013-12-03 ENCOUNTER — Telehealth: Payer: Self-pay | Admitting: Family Medicine

## 2013-12-06 ENCOUNTER — Other Ambulatory Visit: Payer: Self-pay | Admitting: Family Medicine

## 2013-12-10 ENCOUNTER — Other Ambulatory Visit: Payer: Self-pay | Admitting: *Deleted

## 2013-12-10 DIAGNOSIS — E109 Type 1 diabetes mellitus without complications: Secondary | ICD-10-CM

## 2013-12-10 MED ORDER — METFORMIN HCL 1000 MG PO TABS: 1000.0000 mg | ORAL_TABLET | Freq: Two times a day (BID) | ORAL | Status: DC

## 2013-12-10 MED ORDER — LEVOTHYROXINE SODIUM 125 MCG PO TABS: ORAL_TABLET | ORAL | Status: DC

## 2013-12-16 ENCOUNTER — Other Ambulatory Visit: Payer: Self-pay | Admitting: Family Medicine

## 2013-12-17 NOTE — Telephone Encounter (Signed)
Last seen 09/16/13 [facility] [place]  Last glucose 7/15  Requesting 90 day supply

## 2013-12-20 ENCOUNTER — Encounter: Payer: Self-pay | Admitting: Family Medicine

## 2013-12-20 ENCOUNTER — Ambulatory Visit (INDEPENDENT_AMBULATORY_CARE_PROVIDER_SITE_OTHER): Payer: Medicare Other | Admitting: Family Medicine

## 2013-12-20 VITALS — BP 155/81 | HR 92 | Temp 97.1°F | Ht 66.0 in | Wt 197.0 lb

## 2013-12-20 DIAGNOSIS — Z23 Encounter for immunization: Secondary | ICD-10-CM

## 2013-12-20 DIAGNOSIS — E11311 Type 2 diabetes mellitus with unspecified diabetic retinopathy with macular edema: Secondary | ICD-10-CM

## 2013-12-20 LAB — POCT CBC
Granulocyte percent: 57.9 %G (ref 37–80)
HCT, POC: 37.2 % — AB (ref 37.7–47.9)
Hemoglobin: 11.6 g/dL — AB (ref 12.2–16.2)
Lymph, poc: 2.8 (ref 0.6–3.4)
MCH, POC: 26.1 pg — AB (ref 27–31.2)
MCHC: 31.1 g/dL — AB (ref 31.8–35.4)
MCV: 83.9 fL (ref 80–97)
MPV: 7.5 fL (ref 0–99.8)
POC Granulocyte: 4.8 (ref 2–6.9)
POC LYMPH PERCENT: 33.4 %L (ref 10–50)
Platelet Count, POC: 257 10*3/uL (ref 142–424)
RBC: 4.4 M/uL (ref 4.04–5.48)
RDW, POC: 16 %
WBC: 8.3 10*3/uL (ref 4.6–10.2)

## 2013-12-20 LAB — POCT GLYCOSYLATED HEMOGLOBIN (HGB A1C): Hemoglobin A1C: 6.9

## 2013-12-20 MED ORDER — AMLODIPINE BESYLATE 10 MG PO TABS: 10.0000 mg | ORAL_TABLET | Freq: Every day | ORAL | Status: DC

## 2013-12-20 NOTE — Progress Notes (Signed)
   Subjective:    Patient ID: Denise Gardner, female    DOB: [birth year], 68 y.o.   MRN: [id number]  HPI Patient is here for diabetic visit.    Review of Systems  Constitutional: Negative for fever.  HENT: Negative for ear pain.   Eyes: Negative for discharge.  Respiratory: Negative for cough.   Cardiovascular: Negative for chest pain.  Gastrointestinal: Negative for abdominal distention.  Endocrine: Negative for polyuria.  Genitourinary: Negative for difficulty urinating.  Musculoskeletal: Negative for gait problem and neck pain.  Skin: Negative for color change and rash.  Neurological: Negative for speech difficulty and headaches.  Psychiatric/Behavioral: Negative for agitation.       Objective:    BP 155/81  Pulse 92  Temp(Src) 97.1 F (36.2 C) (Oral)  Ht _0  (1.676 m)  Wt 197 lb (89.359 kg)  BMI 31.81 kg/m2 Physical Exam  Constitutional: She is oriented to person, place, and time. She appears well-developed and well-nourished.  HENT:  Head: Normocephalic and atraumatic.  Mouth/Throat: Oropharynx is clear and moist.  Eyes: Pupils are equal, round, and reactive to light.  Neck: Normal range of motion. Neck supple.  Cardiovascular: Normal rate and regular rhythm.   No murmur heard. Pulmonary/Chest: Effort normal and breath sounds normal.  Abdominal: Soft. Bowel sounds are normal. There is no tenderness.  Neurological: She is alert and oriented to person, place, and time.  Skin: Skin is warm and dry.  Psychiatric: She has a normal mood and affect.     Results for orders placed in visit on 12/20/13  POCT CBC      Result Value Ref Range   WBC 8.3  4.6 - 10.2 K/uL   Lymph, poc 2.8  0.6 - 3.4   POC LYMPH PERCENT 33.4  10 - 50 %L   POC Granulocyte 4.8  2 - 6.9   Granulocyte percent 57.9  37 - 80 %G   RBC 4.4  4.04 - 5.48 M/uL   Hemoglobin 11.6 (*) 12.2 - 16.2 g/dL   HCT, POC 37.2 (*) 37.7 - 47.9 %   MCV 83.9  80 - 97 fL   MCH, POC 26.1 (*) 27 - 31.2 pg   MCHC  31.1 (*) 31.8 - 35.4 g/dL   RDW, POC 16.0     Platelet Count, POC 257.0  142 - 424 K/uL   MPV 7.5  0 - 99.8 fL  POCT GLYCOSYLATED HEMOGLOBIN (HGB A1C)      Result Value Ref Range   Hemoglobin A1C 6.9         Assessment & Plan:     ICD-9-CM ICD-10-CM   1. 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  Discussed foot care and hyper and hypoglycemia sx's.  No Follow-up on file.  Lysbeth Penner FNP

## 2013-12-21 LAB — CMP14+EGFR
ALT: 15 IU/L (ref 0–32)
AST: 19 IU/L (ref 0–40)
Albumin/Globulin Ratio: 1.6 (ref 1.1–2.5)
Albumin: 4.3 g/dL (ref 3.5–4.8)
Alkaline Phosphatase: 49 IU/L (ref 39–117)
BUN/Creatinine Ratio: 10 — ABNORMAL LOW (ref 11–26)
BUN: 9 mg/dL (ref 8–27)
CO2: 27 mmol/L (ref 18–29)
Calcium: 9.8 mg/dL (ref 8.7–10.3)
Chloride: 98 mmol/L (ref 97–108)
Creatinine, Ser: 0.91 mg/dL (ref 0.57–1.00)
GFR calc Af Amer: 74 mL/min/{1.73_m2} (ref >59)
GFR calc non Af Amer: 64 mL/min/{1.73_m2} (ref >59)
Globulin, Total: 2.7 g/dL (ref 1.5–4.5)
Glucose: 135 mg/dL — ABNORMAL HIGH (ref 65–99)
Potassium: 4 mmol/L (ref 3.5–5.2)
Sodium: 139 mmol/L (ref 134–144)
Total Bilirubin: 0.3 mg/dL (ref 0.0–1.2)
Total Protein: 7 g/dL (ref 6.0–8.5)

## 2013-12-28 ENCOUNTER — Other Ambulatory Visit: Payer: Self-pay | Admitting: Family Medicine

## 2014-01-03 ENCOUNTER — Other Ambulatory Visit: Payer: Self-pay | Admitting: Family Medicine

## 2014-01-14 ENCOUNTER — Encounter: Payer: Self-pay | Admitting: *Deleted

## 2014-02-01 ENCOUNTER — Other Ambulatory Visit: Payer: Self-pay | Admitting: Family Medicine

## 2014-02-03 ENCOUNTER — Telehealth: Payer: Self-pay | Admitting: Family Medicine

## 2014-02-17 ENCOUNTER — Other Ambulatory Visit: Payer: Self-pay | Admitting: Family Medicine

## 2014-02-23 ENCOUNTER — Other Ambulatory Visit: Payer: Self-pay | Admitting: Family Medicine

## 2014-03-14 ENCOUNTER — Other Ambulatory Visit: Payer: Self-pay | Admitting: Family Medicine

## 2014-03-16 ENCOUNTER — Other Ambulatory Visit: Payer: Self-pay | Admitting: Family

## 2014-03-17 NOTE — Telephone Encounter (Signed)
Last given 12g on 02/17/14.  Should patient be using this much? Last cardiology visit was in 2013.  Last office visit here was 12/20/13.

## 2014-03-18 ENCOUNTER — Other Ambulatory Visit: Payer: Self-pay | Admitting: Family Medicine

## 2014-03-19 ENCOUNTER — Other Ambulatory Visit: Payer: Self-pay | Admitting: Family Medicine

## 2014-03-24 ENCOUNTER — Ambulatory Visit: Payer: Medicare Other | Admitting: Family

## 2014-03-24 ENCOUNTER — Ambulatory Visit: Payer: Medicare Other | Admitting: Family Medicine

## 2014-04-01 ENCOUNTER — Ambulatory Visit: Payer: Self-pay | Admitting: Family

## 2014-04-01 ENCOUNTER — Encounter (INDEPENDENT_AMBULATORY_CARE_PROVIDER_SITE_OTHER): Payer: Self-pay

## 2014-04-10 ENCOUNTER — Other Ambulatory Visit: Payer: Self-pay | Admitting: Family Medicine

## 2014-04-10 MED ORDER — BECLOMETHASONE DIPROPIONATE 40 MCG/ACT IN AERS: 2.0000 | INHALATION_SPRAY | Freq: Two times a day (BID) | RESPIRATORY_TRACT | Status: DC

## 2014-04-10 MED ORDER — NITROGLYCERIN 0.4 MG/SPRAY TL SOLN: Status: DC

## 2014-04-17 ENCOUNTER — Encounter: Payer: Self-pay | Admitting: Family

## 2014-04-17 ENCOUNTER — Ambulatory Visit (INDEPENDENT_AMBULATORY_CARE_PROVIDER_SITE_OTHER): Payer: Commercial Managed Care - HMO | Admitting: Family

## 2014-04-17 VITALS — BP 141/89 | HR 102 | Temp 98.0°F | Ht 66.0 in | Wt 204.0 lb

## 2014-04-17 DIAGNOSIS — E1149 Type 2 diabetes mellitus with other diabetic neurological complication: Secondary | ICD-10-CM

## 2014-04-17 DIAGNOSIS — K219 Gastro-esophageal reflux disease without esophagitis: Secondary | ICD-10-CM

## 2014-04-17 DIAGNOSIS — E039 Hypothyroidism, unspecified: Secondary | ICD-10-CM

## 2014-04-17 DIAGNOSIS — E1140 Type 2 diabetes mellitus with diabetic neuropathy, unspecified: Secondary | ICD-10-CM | POA: Insufficient documentation

## 2014-04-17 DIAGNOSIS — F32A Depression, unspecified: Secondary | ICD-10-CM

## 2014-04-17 DIAGNOSIS — G629 Polyneuropathy, unspecified: Secondary | ICD-10-CM

## 2014-04-17 DIAGNOSIS — J4520 Mild intermittent asthma, uncomplicated: Secondary | ICD-10-CM

## 2014-04-17 DIAGNOSIS — F329 Major depressive disorder, single episode, unspecified: Secondary | ICD-10-CM

## 2014-04-17 DIAGNOSIS — Z1321 Encounter for screening for nutritional disorder: Secondary | ICD-10-CM

## 2014-04-17 DIAGNOSIS — F419 Anxiety disorder, unspecified: Secondary | ICD-10-CM

## 2014-04-17 DIAGNOSIS — J449 Chronic obstructive pulmonary disease, unspecified: Secondary | ICD-10-CM

## 2014-04-17 DIAGNOSIS — N3281 Overactive bladder: Secondary | ICD-10-CM

## 2014-04-17 DIAGNOSIS — E785 Hyperlipidemia, unspecified: Secondary | ICD-10-CM

## 2014-04-17 DIAGNOSIS — I10 Essential (primary) hypertension: Secondary | ICD-10-CM

## 2014-04-17 LAB — POCT GLYCOSYLATED HEMOGLOBIN (HGB A1C): Hemoglobin A1C: 7.2

## 2014-04-17 LAB — POCT UA - MICROALBUMIN: Microalbumin Ur, POC: 20 mg/L

## 2014-04-17 MED ORDER — GABAPENTIN 300 MG PO CAPS: 300.0000 mg | ORAL_CAPSULE | Freq: Three times a day (TID) | ORAL | Status: DC

## 2014-04-17 MED ORDER — SITAGLIPTIN PHOSPHATE 100 MG PO TABS: 100.0000 mg | ORAL_TABLET | Freq: Every day | ORAL | Status: DC

## 2014-04-17 MED ORDER — ESCITALOPRAM OXALATE 10 MG PO TABS
10.0000 mg | ORAL_TABLET | Freq: Every day | ORAL | Status: DC
Start: 2014-04-17 — End: 2014-07-18

## 2014-04-17 MED ORDER — DIAZEPAM 5 MG PO TABS: 5.0000 mg | ORAL_TABLET | Freq: Two times a day (BID) | ORAL | Status: DC | PRN

## 2014-04-17 NOTE — Progress Notes (Signed)
Subjective:    Patient ID: Denise Gardner, female    DOB: December 02, 1945, 69 y.o.   MRN: [id number]  Diabetes She presents for her follow-up diabetic visit. She has type 2 diabetes mellitus. Her disease course has been worsening. Hypoglycemia symptoms include nervousness/anxiousness. Pertinent negatives for hypoglycemia include no confusion, headaches, mood changes or sleepiness. Associated symptoms include foot paresthesias and visual change. Pertinent negatives for diabetes include no foot ulcerations. Pertinent negatives for hypoglycemia complications include no blackouts and no hospitalization. Symptoms are worsening. Diabetic complications include heart disease and peripheral neuropathy. Pertinent negatives for diabetic complications include no CVA or nephropathy. Risk factors for coronary artery disease include diabetes mellitus, dyslipidemia, family history, hypertension, obesity, sedentary lifestyle and post-menopausal. Current diabetic treatment includes oral agent (triple therapy). She is compliant with treatment all of the time. She is following a generally unhealthy diet. Her breakfast blood glucose range is generally 180-200 mg/dl. An ACE inhibitor/angiotensin II receptor blocker is not being taken. Eye exam is not current.  Hypertension This is a chronic problem. The current episode started more than 1 year ago. The problem has been waxing and waning since onset. The problem is uncontrolled. Associated symptoms include anxiety and shortness of breath. Pertinent negatives include no headaches, palpitations or peripheral edema. Risk factors for coronary artery disease include diabetes mellitus, dyslipidemia, family history, obesity, post-menopausal state and sedentary lifestyle. Past treatments include diuretics and direct vasodilators. Hypertensive end-organ damage includes heart failure. There is no history of kidney disease, CAD/MI, CVA or a thyroid problem. There is no history of sleep apnea.    Hyperlipidemia This is a chronic problem. The current episode started more than 1 year ago. The problem is controlled. Recent lipid tests were reviewed and are normal. Exacerbating diseases include diabetes and hypothyroidism. Associated symptoms include shortness of breath. The current treatment provides moderate improvement of lipids. Risk factors for coronary artery disease include diabetes mellitus, dyslipidemia, family history, hypertension, obesity, a sedentary lifestyle and post-menopausal.  Anxiety Presents for follow-up visit. Symptoms include excessive worry, nervous/anxious behavior and shortness of breath. Patient reports no confusion, insomnia, irritability or palpitations. Symptoms occur occasionally.   Her past medical history is significant for anxiety/panic attacks, asthma and depression. Past treatments include nothing. The treatment provided mild relief. Compliance with prior treatments has been good.  Gastrophageal Reflux She reports no belching, no coughing, no heartburn, no sore throat or no wheezing. This is a chronic problem. The current episode started more than 1 year ago. The problem occurs rarely. The problem has been resolved. The symptoms are aggravated by certain foods. She has tried a PPI for the symptoms. The treatment provided significant relief.  Asthma She complains of frequent throat clearing and shortness of breath. There is no cough or wheezing. This is a chronic problem. The current episode started more than 1 year ago. The problem occurs intermittently. The problem has been waxing and waning. Pertinent negatives include no headaches, heartburn or sore throat. Her symptoms are aggravated by change in weather. Her symptoms are alleviated by rest and steroid inhaler. She reports moderate improvement on treatment. Her past medical history is significant for asthma.  Thyroid Problem Presents for follow-up visit. Symptoms include anxiety, constipation, dry skin and  visual change. Patient reports no diarrhea or palpitations. The symptoms have been stable. Past treatments include levothyroxine. The treatment provided significant relief. Her past medical history is significant for diabetes, heart failure and hyperlipidemia.  Peripheral Neuropathy Pt currently taking gabapentin 129m TID. Pt would like to  discuss increasing the dose today. Pt is having a lot of pain and numbness in legs.    Review of Systems  Constitutional: Negative.  Negative for irritability.  HENT: Negative.  Negative for sore throat.   Eyes: Negative.   Respiratory: Positive for shortness of breath. Negative for cough and wheezing.   Cardiovascular: Negative.  Negative for palpitations.  Gastrointestinal: Positive for constipation. Negative for heartburn and diarrhea.  Endocrine: Negative.   Genitourinary: Negative.   Musculoskeletal: Negative.   Neurological: Negative.  Negative for headaches.  Hematological: Negative.   Psychiatric/Behavioral: Negative for confusion. The patient is nervous/anxious. The patient does not have insomnia.   All other systems reviewed and are negative.      Objective:   Physical Exam  Constitutional: She is oriented to person, place, and time. She appears well-developed and well-nourished. No distress.  HENT:  Head: Normocephalic and atraumatic.  Right Ear: External ear normal.  Left Ear: External ear normal.  Nose: Nose normal.  Mouth/Throat: Oropharynx is clear and moist.  Eyes: Pupils are equal, round, and reactive to light.  Neck: Normal range of motion. Neck supple. No thyromegaly present.  Cardiovascular: Normal rate, regular rhythm, normal heart sounds and intact distal pulses.   No murmur heard. Pulmonary/Chest: Effort normal and breath sounds normal. No respiratory distress. She has no wheezes.  Abdominal: Soft. Bowel sounds are normal. She exhibits no distension. There is no tenderness.  Musculoskeletal: Normal range of motion. She  exhibits no edema or tenderness.  Neurological: She is alert and oriented to person, place, and time. She has normal reflexes. No cranial nerve deficit.  Skin: Skin is warm and dry.  Psychiatric: She has a normal mood and affect. Her behavior is normal. Judgment and thought content normal.  Vitals reviewed.   BP 141/89 mmHg  Pulse 102  Temp(Src) 98 F (36.7 C) (Oral)  Ht '5\' 6"'  (1.676 m)  Wt 204 lb (92.534 kg)  BMI 32.94 kg/m2       Assessment & Plan:  1. Essential hypertension - CMP14+EGFR  2. Asthma, mild intermittent, uncomplicated - YIA16+PVVZ  3. Chronic obstructive pulmonary disease, unspecified COPD, unspecified chronic bronchitis type - CMP14+EGFR  4. Gastroesophageal reflux disease, esophagitis presence not specified - CMP14+EGFR  5. Hypothyroidism, unspecified hypothyroidism type - CMP14+EGFR - Thyroid Panel With TSH  6. Diabetes mellitus type 2 with neurological manifestations - POCT glycosylated hemoglobin (Hb A1C) - POCT UA - Microalbumin - CMP14+EGFR - sitaGLIPtin (JANUVIA) 100 MG tablet; Take 1 tablet (100 mg total) by mouth daily.  Dispense: 90 tablet; Refill: 4  7. Anxiety - CMP14+EGFR - diazepam (VALIUM) 5 MG tablet; Take 1 tablet (5 mg total) by mouth every 12 (twelve) hours as needed for anxiety.  Dispense: 30 tablet; Refill: 1 - escitalopram (LEXAPRO) 10 MG tablet; Take 1 tablet (10 mg total) by mouth daily.  Dispense: 90 tablet; Refill: 3  8. Hyperlipidemia - CMP14+EGFR - Lipid panel  9. Peripheral neuropathy - CMP14+EGFR - gabapentin (NEURONTIN) 300 MG capsule; Take 1 capsule (300 mg total) by mouth 3 (three) times daily.  Dispense: 270 capsule; Refill: 4  10. OAB (overactive bladder) - CMP14+EGFR  11. Encounter for vitamin deficiency screening - Vit D  25 hydroxy (rtn osteoporosis monitoring)  12. Depression - diazepam (VALIUM) 5 MG tablet; Take 1 tablet (5 mg total) by mouth every 12 (twelve) hours as needed for anxiety.  Dispense:  30 tablet; Refill: 1 - escitalopram (LEXAPRO) 10 MG tablet; Take 1 tablet (10 mg total) by  mouth daily.  Dispense: 90 tablet; Refill: 3   Continue all meds Labs pending Health Maintenance reviewed Diet and exercise encouraged RTO 3 months  Evelina Dun, FNP

## 2014-04-17 NOTE — Patient Instructions (Addendum)
Health Maintenance Adopting a healthy lifestyle and getting preventive care can go a long way to promote health and wellness. Talk with your health care provider about what schedule of regular examinations is right for you. This is a good chance for you to check in with your provider about disease prevention and staying healthy. In between checkups, there are plenty of things you can do on your own. Experts have done a lot of research about which lifestyle changes and preventive measures are most likely to keep you healthy. Ask your health care provider for more information. WEIGHT AND DIET  Eat a healthy diet  Be sure to include plenty of vegetables, fruits, low-fat dairy products, and lean protein.  Do not eat a lot of foods high in solid fats, added sugars, or salt.  Get regular exercise. This is one of the most important things you can do for your health.  Most adults should exercise for at least 150 minutes each week. The exercise should increase your heart rate and make you sweat (moderate-intensity exercise).  Most adults should also do strengthening exercises at least twice a week. This is in addition to the moderate-intensity exercise.  Maintain a healthy weight  Body mass index (BMI) is a measurement that can be used to identify possible weight problems. It estimates body fat based on height and weight. Your health care provider can help determine your BMI and help you achieve or maintain a healthy weight.  For females 25 years of age and older:   A BMI below 18.5 is considered underweight.  A BMI of 18.5 to 24.9 is normal.  A BMI of 25 to 29.9 is considered overweight.  A BMI of 30 and above is considered obese.  Watch levels of cholesterol and blood lipids  You should start having your blood tested for lipids and cholesterol at 71 years of age, then have this test every 5 years.  You may need to have your cholesterol levels checked more often if:  Your lipid or  cholesterol levels are high.  You are older than 71 years of age.  You are at high risk for heart disease.  CANCER SCREENING   Lung Cancer  Lung cancer screening is recommended for adults 71-71 years old who are at high risk for lung cancer because of a history of smoking.  A yearly low-dose CT scan of the lungs is recommended for people who:  Currently smoke.  Have quit within the past 15 years.  Have at least a 30-pack-year history of smoking. A pack year is smoking an average of one pack of cigarettes a day for 1 year.  Yearly screening should continue until it has been 15 years since you quit.  Yearly screening should stop if you develop a health problem that would prevent you from having lung cancer treatment.  Breast Cancer  Practice breast self-awareness. This means understanding how your breasts normally appear and feel.  It also means doing regular breast self-exams. Let your health care provider know about any changes, no matter how small.  If you are in your 71s or 71s, you should have a clinical breast exam (CBE) by a health care provider every 1-3 years as part of a regular health exam.  If you are 71 or older, have a CBE every year. Also consider having a breast X-ray (mammogram) every year.  If you have a family history of breast cancer, talk to your health care provider about genetic screening.  If you are  at high risk for breast cancer, talk to your health care provider about having an MRI and a mammogram every year.  Breast cancer gene (BRCA) assessment is recommended for women who have family members with BRCA-related cancers. BRCA-related cancers include:  Breast.  Ovarian.  Tubal.  Peritoneal cancers.  Results of the assessment will determine the need for genetic counseling and BRCA1 and BRCA2 testing. Cervical Cancer Routine pelvic examinations to screen for cervical cancer are no longer recommended for nonpregnant women who are considered low  risk for cancer of the pelvic organs (ovaries, uterus, and vagina) and who do not have symptoms. A pelvic examination may be necessary if you have symptoms including those associated with pelvic infections. Ask your health care provider if a screening pelvic exam is right for you.   The Pap test is the screening test for cervical cancer for women who are considered at risk.  If you had a hysterectomy for a problem that was not cancer or a condition that could lead to cancer, then you no longer need Pap tests.  If you are older than 71 years, and you have had normal Pap tests for the past 10 years, you no longer need to have Pap tests.  If you have had past treatment for cervical cancer or a condition that could lead to cancer, you need Pap tests and screening for cancer for at least 20 years after your treatment.  If you no longer get a Pap test, assess your risk factors if they change (such as having a new sexual partner). This can affect whether you should start being screened again.  Some women have medical problems that increase their chance of getting cervical cancer. If this is the case for you, your health care provider may recommend more frequent screening and Pap tests.  The human papillomavirus (HPV) test is another test that may be used for cervical cancer screening. The HPV test looks for the virus that can cause cell changes in the cervix. The cells collected during the Pap test can be tested for HPV.  The HPV test can be used to screen women 71 years of age and older. Getting tested for HPV can extend the interval between normal Pap tests from three to five years.  An HPV test also should be used to screen women of any age who have unclear Pap test results.  After 71 years of age, women should have HPV testing as often as Pap tests.  Colorectal Cancer  This type of cancer can be detected and often prevented.  Routine colorectal cancer screening usually begins at 71 years of  age and continues through 71 years of age.  Your health care provider may recommend screening at an earlier age if you have risk factors for colon cancer.  Your health care provider may also recommend using home test kits to check for hidden blood in the stool.  A small camera at the end of a tube can be used to examine your colon directly (sigmoidoscopy or colonoscopy). This is done to check for the earliest forms of colorectal cancer.  Routine screening usually begins at age 50.  Direct examination of the colon should be repeated every 5-10 years through 71 years of age. However, you may need to be screened more often if early forms of precancerous polyps or small growths are found. Skin Cancer  Check your skin from head to toe regularly.  Tell your health care provider about any new moles or changes in   moles, especially if there is a change in a mole's shape or color.  Also tell your health care provider if you have a mole that is larger than the size of a pencil eraser.  Always use sunscreen. Apply sunscreen liberally and repeatedly throughout the day.  Protect yourself by wearing long sleeves, pants, a wide-brimmed hat, and sunglasses whenever you are outside. HEART DISEASE, DIABETES, AND HIGH BLOOD PRESSURE   Have your blood pressure checked at least every 1-2 years. High blood pressure causes heart disease and increases the risk of stroke.  If you are between 75 years and 42 years old, ask your health care provider if you should take aspirin to prevent strokes.  Have regular diabetes screenings. This involves taking a blood sample to check your fasting blood sugar level.  If you are at a normal weight and have a low risk for diabetes, have this test once every three years after 71 years of age.  If you are overweight and have a high risk for diabetes, consider being tested at a younger age or more often. PREVENTING INFECTION  Hepatitis B  If you have a higher risk for  hepatitis B, you should be screened for this virus. You are considered at high risk for hepatitis B if:  You were born in a country where hepatitis B is common. Ask your health care provider which countries are considered high risk.  Your parents were born in a high-risk country, and you have not been immunized against hepatitis B (hepatitis B vaccine).  You have HIV or AIDS.  You use needles to inject street drugs.  You live with someone who has hepatitis B.  You have had sex with someone who has hepatitis B.  You get hemodialysis treatment.  You take certain medicines for conditions, including cancer, organ transplantation, and autoimmune conditions. Hepatitis C  Blood testing is recommended for:  Everyone born from [birth year] through 1967.  Anyone with known risk factors for hepatitis C. Sexually transmitted infections (STIs)  You should be screened for sexually transmitted infections (STIs) including gonorrhea and chlamydia if:  You are sexually active and are younger than 71 years of age.  You are older than 71 years of age and your health care provider tells you that you are at risk for this type of infection.  Your sexual activity has changed since you were last screened and you are at an increased risk for chlamydia or gonorrhea. Ask your health care provider if you are at risk.  If you do not have HIV, but are at risk, it may be recommended that you take a prescription medicine daily to prevent HIV infection. This is called pre-exposure prophylaxis (PrEP). You are considered at risk if:  You are sexually active and do not regularly use condoms or know the HIV status of your partner(s).  You take drugs by injection.  You are sexually active with a partner who has HIV. Talk with your health care provider about whether you are at high risk of being infected with HIV. If you choose to begin PrEP, you should first be tested for HIV. You should then be tested every 3 months for  as long as you are taking PrEP.  PREGNANCY   If you are premenopausal and you may become pregnant, ask your health care provider about preconception counseling.  If you may become pregnant, take 400 to 800 micrograms (mcg) of folic acid every day.  If you want to prevent pregnancy, talk to your  health care provider about birth control (contraception). OSTEOPOROSIS AND MENOPAUSE   Osteoporosis is a disease in which the bones lose minerals and strength with aging. This can result in serious bone fractures. Your risk for osteoporosis can be identified using a bone density scan.  If you are 82 years of age or older, or if you are at risk for osteoporosis and fractures, ask your health care provider if you should be screened.  Ask your health care provider whether you should take a calcium or vitamin D supplement to lower your risk for osteoporosis.  Menopause may have certain physical symptoms and risks.  Hormone replacement therapy may reduce some of these symptoms and risks. Talk to your health care provider about whether hormone replacement therapy is right for you.  HOME CARE INSTRUCTIONS   Schedule regular health, dental, and eye exams.  Stay current with your immunizations.   Do not use any tobacco products including cigarettes, chewing tobacco, or electronic cigarettes.  If you are pregnant, do not drink alcohol.  If you are breastfeeding, limit how much and how often you drink alcohol.  Limit alcohol intake to no more than 1 drink per day for nonpregnant women. One drink equals 12 ounces of beer, 5 ounces of wine, or 1 ounces of hard liquor.  Do not use street drugs.  Do not share needles.  Ask your health care provider for help if you need support or information about quitting drugs.  Tell your health care provider if you often feel depressed.  Tell your health care provider if you have ever been abused or do not feel safe at home. Document Released: 08/30/2010  Document Revised: 07/01/2013 Document Reviewed: 01/16/2013 [facility] Patient Information 2015 [place], [place]. This information is not intended to replace advice given to you by your health care provider. Make sure you discuss any questions you have with your health care provider. Generalized Anxiety Disorder Generalized anxiety disorder (GAD) is a mental disorder. It interferes with life functions, including relationships, work, and school. GAD is different from normal anxiety, which everyone experiences at some point in their lives in response to specific life events and activities. Normal anxiety actually helps [place] prepare for and get through these life events and activities. Normal anxiety goes away after the event or activity is over.  GAD causes anxiety that is not necessarily related to specific events or activities. It also causes excess anxiety in proportion to specific events or activities. The anxiety associated with GAD is also difficult to control. GAD can vary from mild to severe. People with severe GAD can have intense waves of anxiety with physical symptoms (panic attacks).  SYMPTOMS The anxiety and worry associated with GAD are difficult to control. This anxiety and worry are related to many life events and activities and also occur more days than not for 6 months or longer. People with GAD also have three or more of the following symptoms (one or more in children):  Restlessness.   Fatigue.  Difficulty concentrating.   Irritability.  Muscle tension.  Difficulty sleeping or unsatisfying sleep. DIAGNOSIS GAD is diagnosed through an assessment by your health care provider. Your health care provider will ask you questions aboutyour mood,physical symptoms, and events in your life. Your health care provider may ask you about your medical history and use of alcohol or drugs, including prescription medicines. Your health care provider may also do a physical exam and blood tests.  Certain medical conditions and the use of certain substances can cause  symptoms similar to those associated with GAD. Your health care provider may refer you to a mental health specialist for further evaluation. TREATMENT The following therapies are usually used to treat GAD:   Medication. Antidepressant medication usually is prescribed for long-term daily control. Antianxiety medicines may be added in severe cases, especially when panic attacks occur.   Talk therapy (psychotherapy). Certain types of talk therapy can be helpful in treating GAD by providing support, education, and guidance. A form of talk therapy called cognitive behavioral therapy can teach you healthy ways to think about and react to daily life events and activities.  Stress managementtechniques. These include yoga, meditation, and exercise and can be very helpful when they are practiced regularly. A mental health specialist can help determine which treatment is best for you. Some people see improvement with one therapy. However, other people require a combination of therapies. Document Released: 06/11/2012 Document Revised: 07/01/2013 Document Reviewed: 06/11/2012 [facility] Patient Information 2015 [place], [place]. This information is not intended to replace advice given to you by your health care provider. Make sure you discuss any questions you have with your health care provider.

## 2014-04-17 NOTE — Addendum Note (Signed)
Addended by: Earlene Plater on: 04/17/2014 01:30 PM   Modules accepted: Orders

## 2014-04-17 NOTE — Addendum Note (Signed)
Addended by: Pollyann Kennedy F on: 04/17/2014 12:45 PM   Modules accepted: Orders

## 2014-04-18 LAB — CMP14+EGFR
ALT: 14 IU/L (ref 0–32)
AST: 17 IU/L (ref 0–40)
Albumin/Globulin Ratio: 1.5 (ref 1.1–2.5)
Albumin: 4.3 g/dL (ref 3.5–4.8)
Alkaline Phosphatase: 45 IU/L (ref 39–117)
BUN/Creatinine Ratio: 15 (ref 11–26)
BUN: 14 mg/dL (ref 8–27)
Bilirubin Total: 0.3 mg/dL (ref 0.0–1.2)
CO2: 25 mmol/L (ref 18–29)
Calcium: 10.3 mg/dL (ref 8.7–10.3)
Chloride: 97 mmol/L (ref 97–108)
Creatinine, Ser: 0.96 mg/dL (ref 0.57–1.00)
GFR calc Af Amer: 69 mL/min/{1.73_m2} (ref >59)
GFR calc non Af Amer: 60 mL/min/{1.73_m2} (ref >59)
Globulin, Total: 2.8 g/dL (ref 1.5–4.5)
Glucose: 159 mg/dL — ABNORMAL HIGH (ref 65–99)
Potassium: 3.8 mmol/L (ref 3.5–5.2)
Sodium: 140 mmol/L (ref 134–144)
Total Protein: 7.1 g/dL (ref 6.0–8.5)

## 2014-04-18 LAB — LIPID PANEL
Chol/HDL Ratio: 2.3 ratio units (ref 0.0–4.4)
Cholesterol, Total: 190 mg/dL (ref 100–199)
HDL: 82 mg/dL (ref >39)
LDL Calculated: 98 mg/dL (ref 0–99)
Triglycerides: 49 mg/dL (ref 0–149)
VLDL Cholesterol Cal: 10 mg/dL (ref 5–40)

## 2014-04-18 LAB — THYROID PANEL WITH TSH
Free Thyroxine Index: 3 (ref 1.2–4.9)
T3 Uptake Ratio: 40 % — ABNORMAL HIGH (ref 24–39)
T4, Total: 7.5 ug/dL (ref 4.5–12.0)
TSH: 1.33 u[IU]/mL (ref 0.450–4.500)

## 2014-04-18 LAB — MICROALBUMIN, URINE: Microalbumin, Urine: 12.9 ug/mL (ref 0.0–17.0)

## 2014-04-18 LAB — VITAMIN D 25 HYDROXY (VIT D DEFICIENCY, FRACTURES): Vit D, 25-Hydroxy: 47.4 ng/mL (ref 30.0–100.0)

## 2014-04-24 ENCOUNTER — Other Ambulatory Visit: Payer: Self-pay | Admitting: Family Medicine

## 2014-05-03 ENCOUNTER — Other Ambulatory Visit: Payer: Self-pay | Admitting: Family Medicine

## 2014-05-03 ENCOUNTER — Other Ambulatory Visit: Payer: Self-pay | Admitting: Nurse Practitioner

## 2014-05-10 ENCOUNTER — Other Ambulatory Visit: Payer: Self-pay | Admitting: Family

## 2014-05-12 ENCOUNTER — Other Ambulatory Visit: Payer: Self-pay | Admitting: Family

## 2014-05-18 ENCOUNTER — Other Ambulatory Visit: Payer: Self-pay | Admitting: Family Medicine

## 2014-05-21 ENCOUNTER — Other Ambulatory Visit: Payer: Self-pay | Admitting: Family

## 2014-05-26 ENCOUNTER — Other Ambulatory Visit: Payer: Self-pay | Admitting: Family

## 2014-06-15 ENCOUNTER — Other Ambulatory Visit: Payer: Self-pay | Admitting: Family

## 2014-06-15 ENCOUNTER — Other Ambulatory Visit: Payer: Self-pay | Admitting: Family Medicine

## 2014-06-16 ENCOUNTER — Other Ambulatory Visit: Payer: Self-pay | Admitting: Family

## 2014-06-17 ENCOUNTER — Other Ambulatory Visit: Payer: Self-pay | Admitting: Family Medicine

## 2014-06-30 ENCOUNTER — Other Ambulatory Visit: Payer: Self-pay | Admitting: Family Medicine

## 2014-07-14 ENCOUNTER — Other Ambulatory Visit: Payer: Self-pay | Admitting: Family

## 2014-07-15 ENCOUNTER — Other Ambulatory Visit: Payer: Self-pay | Admitting: Family

## 2014-07-16 ENCOUNTER — Ambulatory Visit: Payer: Commercial Managed Care - HMO | Admitting: Family

## 2014-07-18 ENCOUNTER — Encounter: Payer: Self-pay | Admitting: Family

## 2014-07-18 ENCOUNTER — Ambulatory Visit (INDEPENDENT_AMBULATORY_CARE_PROVIDER_SITE_OTHER): Payer: Commercial Managed Care - HMO | Admitting: Family

## 2014-07-18 VITALS — BP 155/90 | HR 95 | Temp 97.3°F | Ht 66.0 in | Wt 195.0 lb

## 2014-07-18 DIAGNOSIS — F32A Depression, unspecified: Secondary | ICD-10-CM | POA: Insufficient documentation

## 2014-07-18 DIAGNOSIS — N3281 Overactive bladder: Secondary | ICD-10-CM | POA: Diagnosis not present

## 2014-07-18 DIAGNOSIS — K219 Gastro-esophageal reflux disease without esophagitis: Secondary | ICD-10-CM | POA: Diagnosis not present

## 2014-07-18 DIAGNOSIS — F419 Anxiety disorder, unspecified: Secondary | ICD-10-CM

## 2014-07-18 DIAGNOSIS — I10 Essential (primary) hypertension: Secondary | ICD-10-CM

## 2014-07-18 DIAGNOSIS — J4520 Mild intermittent asthma, uncomplicated: Secondary | ICD-10-CM | POA: Diagnosis not present

## 2014-07-18 DIAGNOSIS — M8580 Other specified disorders of bone density and structure, unspecified site: Secondary | ICD-10-CM | POA: Diagnosis not present

## 2014-07-18 DIAGNOSIS — K2100 Gastro-esophageal reflux disease with esophagitis, without bleeding: Secondary | ICD-10-CM

## 2014-07-18 DIAGNOSIS — G629 Polyneuropathy, unspecified: Secondary | ICD-10-CM

## 2014-07-18 DIAGNOSIS — J449 Chronic obstructive pulmonary disease, unspecified: Secondary | ICD-10-CM | POA: Diagnosis not present

## 2014-07-18 DIAGNOSIS — F329 Major depressive disorder, single episode, unspecified: Secondary | ICD-10-CM

## 2014-07-18 DIAGNOSIS — R32 Unspecified urinary incontinence: Secondary | ICD-10-CM

## 2014-07-18 DIAGNOSIS — E1140 Type 2 diabetes mellitus with diabetic neuropathy, unspecified: Secondary | ICD-10-CM | POA: Diagnosis not present

## 2014-07-18 DIAGNOSIS — E11311 Type 2 diabetes mellitus with unspecified diabetic retinopathy with macular edema: Secondary | ICD-10-CM

## 2014-07-18 DIAGNOSIS — I25119 Atherosclerotic heart disease of native coronary artery with unspecified angina pectoris: Secondary | ICD-10-CM | POA: Diagnosis not present

## 2014-07-18 DIAGNOSIS — E039 Hypothyroidism, unspecified: Secondary | ICD-10-CM | POA: Diagnosis not present

## 2014-07-18 DIAGNOSIS — E1149 Type 2 diabetes mellitus with other diabetic neurological complication: Secondary | ICD-10-CM

## 2014-07-18 DIAGNOSIS — E785 Hyperlipidemia, unspecified: Secondary | ICD-10-CM

## 2014-07-18 LAB — POCT GLYCOSYLATED HEMOGLOBIN (HGB A1C): Hemoglobin A1C: 6.9

## 2014-07-18 MED ORDER — TRIAMTERENE-HCTZ 37.5-25 MG PO CAPS: 1.0000 | ORAL_CAPSULE | Freq: Every morning | ORAL | Status: DC

## 2014-07-18 MED ORDER — GLIPIZIDE ER 5 MG PO TB24: ORAL_TABLET | ORAL | Status: DC

## 2014-07-18 MED ORDER — METFORMIN HCL 1000 MG PO TABS: ORAL_TABLET | ORAL | Status: DC

## 2014-07-18 MED ORDER — SITAGLIPTIN PHOSPHATE 100 MG PO TABS: 100.0000 mg | ORAL_TABLET | Freq: Every day | ORAL | Status: DC

## 2014-07-18 MED ORDER — ICOSAPENT ETHYL 1 G PO CAPS: ORAL_CAPSULE | ORAL | Status: DC

## 2014-07-18 MED ORDER — RALOXIFENE HCL 60 MG PO TABS: 60.0000 mg | ORAL_TABLET | Freq: Every day | ORAL | Status: DC

## 2014-07-18 MED ORDER — ISOSORBIDE MONONITRATE ER 60 MG PO TB24: 60.0000 mg | ORAL_TABLET | Freq: Every morning | ORAL | Status: DC

## 2014-07-18 MED ORDER — LEVOTHYROXINE SODIUM 125 MCG PO TABS: 125.0000 ug | ORAL_TABLET | Freq: Every day | ORAL | Status: DC

## 2014-07-18 MED ORDER — CLONIDINE HCL 0.1 MG PO TABS: 0.1000 mg | ORAL_TABLET | Freq: Two times a day (BID) | ORAL | Status: DC

## 2014-07-18 MED ORDER — BECLOMETHASONE DIPROPIONATE 40 MCG/ACT IN AERS: 2.0000 | INHALATION_SPRAY | Freq: Two times a day (BID) | RESPIRATORY_TRACT | Status: DC

## 2014-07-18 MED ORDER — OMEPRAZOLE 20 MG PO CPDR: 20.0000 mg | DELAYED_RELEASE_CAPSULE | Freq: Two times a day (BID) | ORAL | Status: DC

## 2014-07-18 MED ORDER — MONTELUKAST SODIUM 10 MG PO TABS: 10.0000 mg | ORAL_TABLET | Freq: Every day | ORAL | Status: DC

## 2014-07-18 MED ORDER — AMLODIPINE BESYLATE 10 MG PO TABS: 10.0000 mg | ORAL_TABLET | Freq: Every day | ORAL | Status: DC

## 2014-07-18 MED ORDER — MIRABEGRON ER 25 MG PO TB24: 25.0000 mg | ORAL_TABLET | Freq: Every day | ORAL | Status: DC

## 2014-07-18 MED ORDER — GABAPENTIN 300 MG PO CAPS: 300.0000 mg | ORAL_CAPSULE | Freq: Three times a day (TID) | ORAL | Status: DC

## 2014-07-18 MED ORDER — ESCITALOPRAM OXALATE 10 MG PO TABS: 10.0000 mg | ORAL_TABLET | Freq: Every day | ORAL | Status: DC

## 2014-07-18 MED ORDER — NIACIN ER (ANTIHYPERLIPIDEMIC) 1000 MG PO TBCR: 2000.0000 mg | EXTENDED_RELEASE_TABLET | Freq: Every day | ORAL | Status: DC

## 2014-07-18 NOTE — Progress Notes (Signed)
Subjective:    Patient ID: Denise Gardner, female    DOB: 1945/08/21, 69 y.o.   MRN: [id number]  Diabetes She presents for her follow-up diabetic visit. She has type 2 diabetes mellitus. Her disease course has been worsening. Hypoglycemia symptoms include nervousness/anxiousness. Pertinent negatives for hypoglycemia include no confusion, headaches, mood changes or sleepiness. Associated symptoms include foot paresthesias and visual change. Pertinent negatives for diabetes include no foot ulcerations. Pertinent negatives for hypoglycemia complications include no blackouts and no hospitalization. Symptoms are worsening. Diabetic complications include heart disease and peripheral neuropathy. Pertinent negatives for diabetic complications include no CVA or nephropathy. Risk factors for coronary artery disease include diabetes mellitus, dyslipidemia, family history, hypertension, obesity, sedentary lifestyle and post-menopausal. Current diabetic treatment includes oral agent (triple therapy). She is compliant with treatment all of the time. She is following a generally unhealthy diet. Her breakfast blood glucose range is generally 180-200 mg/dl. An ACE inhibitor/angiotensin II receptor blocker is not being taken. Eye exam is not current.  Hypertension This is a chronic problem. The current episode started more than 1 year ago. The problem has been waxing and waning since onset. The problem is uncontrolled. Associated symptoms include anxiety and shortness of breath. Pertinent negatives include no headaches, palpitations or peripheral edema. Risk factors for coronary artery disease include diabetes mellitus, dyslipidemia, family history, obesity, post-menopausal state and sedentary lifestyle. Past treatments include diuretics and direct vasodilators. Hypertensive end-organ damage includes heart failure. There is no history of kidney disease, CAD/MI, CVA or a thyroid problem. There is no history of sleep apnea.    Hyperlipidemia This is a chronic problem. The current episode started more than 1 year ago. The problem is controlled. Recent lipid tests were reviewed and are normal. Exacerbating diseases include diabetes and hypothyroidism. Associated symptoms include shortness of breath. Current antihyperlipidemic treatment includes herbal therapy. The current treatment provides moderate improvement of lipids. Risk factors for coronary artery disease include diabetes mellitus, dyslipidemia, family history, hypertension, obesity, a sedentary lifestyle and post-menopausal.  Anxiety Presents for follow-up visit. Onset was 1 to 6 months ago. The problem has been waxing and waning. Symptoms include excessive worry, nervous/anxious behavior and shortness of breath. Patient reports no confusion, insomnia, irritability or palpitations. Symptoms occur occasionally.   Her past medical history is significant for anxiety/panic attacks, asthma and depression. Past treatments include nothing. The treatment provided mild relief. Compliance with prior treatments has been good.  Gastrophageal Reflux She complains of a hoarse voice. She reports no belching, no coughing, no heartburn, no sore throat or no wheezing. This is a chronic problem. The current episode started more than 1 year ago. The problem occurs rarely. The problem has been resolved. The symptoms are aggravated by certain foods. She has tried a PPI for the symptoms. The treatment provided significant relief.  Asthma She complains of frequent throat clearing, hoarse voice and shortness of breath. There is no cough or wheezing. This is a chronic problem. The current episode started more than 1 year ago. The problem occurs intermittently. The problem has been waxing and waning. Pertinent negatives include no headaches, heartburn or sore throat. Her symptoms are aggravated by change in weather. Her symptoms are alleviated by rest and steroid inhaler. She reports moderate  improvement on treatment. Her past medical history is significant for asthma.  Thyroid Problem Presents for follow-up visit. Symptoms include anxiety, constipation, dry skin, hoarse voice and visual change. Patient reports no diarrhea or palpitations. The symptoms have been stable. Past treatments include levothyroxine.  The treatment provided significant relief. Her past medical history is significant for diabetes, heart failure and hyperlipidemia.      Review of Systems  Constitutional: Negative.  Negative for irritability.  HENT: Positive for hoarse voice. Negative for sore throat.   Eyes: Negative.   Respiratory: Positive for shortness of breath. Negative for cough and wheezing.   Cardiovascular: Negative.  Negative for palpitations.  Gastrointestinal: Positive for constipation. Negative for heartburn and diarrhea.  Endocrine: Negative.   Genitourinary: Negative.   Musculoskeletal: Negative.   Neurological: Negative.  Negative for headaches.  Hematological: Negative.   Psychiatric/Behavioral: Negative for confusion. The patient is nervous/anxious. The patient does not have insomnia.   All other systems reviewed and are negative.      Objective:   Physical Exam  Constitutional: She is oriented to person, place, and time. She appears well-developed and well-nourished. No distress.  HENT:  Head: Normocephalic and atraumatic.  Right Ear: External ear normal.  Left Ear: External ear normal.  Nose: Nose normal.  Mouth/Throat: Oropharynx is clear and moist.  Eyes: Pupils are equal, round, and reactive to light.  Neck: Normal range of motion. Neck supple. No thyromegaly present.  Cardiovascular: Normal rate, regular rhythm, normal heart sounds and intact distal pulses.   No murmur heard. Pulmonary/Chest: Effort normal. No respiratory distress. She has wheezes.  Abdominal: Soft. Bowel sounds are normal. She exhibits no distension. There is no tenderness.  Musculoskeletal: Normal  range of motion. She exhibits no edema or tenderness.  Neurological: She is alert and oriented to person, place, and time. She has normal reflexes. No cranial nerve deficit.  Skin: Skin is warm and dry.  Psychiatric: She has a normal mood and affect. Her behavior is normal. Judgment and thought content normal.  Vitals reviewed.     BP 155/90 mmHg  Pulse 95  Temp(Src) 97.3 F (36.3 C) (Oral)  Ht _0  (1.676 m)  Wt 195 lb (88.451 kg)  BMI 31.49 kg/m2     Assessment & Plan:  1. Essential hypertension - CMP14+EGFR  2. Coronary artery disease involving native coronary artery of native heart with angina pectoris - CMP14+EGFR  3. Gastroesophageal reflux disease, esophagitis presence not specified - CMP14+EGFR  4. Hypothyroidism, unspecified hypothyroidism type - CMP14+EGFR - levothyroxine (SYNTHROID, LEVOTHROID) 125 MCG tablet; Take 1 tablet (125 mcg total) by mouth daily.  Dispense: 90 tablet; Refill: 3  5. OAB (overactive bladder) - CMP14+EGFR - Thyroid Panel With TSH  6. Anxiety - CMP14+EGFR - escitalopram (LEXAPRO) 10 MG tablet; Take 1 tablet (10 mg total) by mouth daily.  Dispense: 90 tablet; Refill: 3  7. Hyperlipidemia - CMP14+EGFR - Lipid panel  8. Chronic obstructive pulmonary disease, unspecified COPD, unspecified chronic bronchitis type - CMP14+EGFR  9. Asthma, mild intermittent, uncomplicated - KWI09+BDZH - montelukast (SINGULAIR) 10 MG tablet; Take 1 tablet (10 mg total) by mouth at bedtime.  Dispense: 30 tablet; Refill: 3  10. Osteopenia - CMP14+EGFR  11. Diabetes mellitus type 2 with neurological manifestations - POCT glycosylated hemoglobin (Hb A1C) - CMP14+EGFR - sitaGLIPtin (JANUVIA) 100 MG tablet; Take 1 tablet (100 mg total) by mouth daily.  Dispense: 90 tablet; Refill: 4  12. Peripheral neuropathy  - CMP14+EGFR - gabapentin (NEURONTIN) 300 MG capsule; Take 1 capsule (300 mg total) by mouth 3 (three) times daily.  Dispense: 270 capsule;  Refill: 4  13. Depression - CMP14+EGFR - escitalopram (LEXAPRO) 10 MG tablet; Take 1 tablet (10 mg total) by mouth daily.  Dispense: 90 tablet; Refill: 3  14. Gastroesophageal reflux disease with esophagitis  - omeprazole (PRILOSEC) 20 MG capsule; Take 1 capsule (20 mg total) by mouth 2 (two) times daily before a meal.  Dispense: 180 capsule; Refill: 3 - sitaGLIPtin (JANUVIA) 100 MG tablet; Take 1 tablet (100 mg total) by mouth daily.  Dispense: 90 tablet; Refill: 2 - glipiZIDE (GLUCOTROL XL) 5 MG 24 hr tablet; TAKE 2 TABLETS BY MOUTH EVERY MORNING AND 1 TABLET EVERY EVENING  Dispense: 270 tablet; Refill: 2 - metFORMIN (GLUCOPHAGE) 1000 MG tablet; TAKE 1 TABLET BY MOUTH TWICE DAILY WITH A MEAL  Dispense: 180 tablet; Refill: 2  15. 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  Dispense: 90 tablet; Refill: 3  16. Urinary incontinence, unspecified incontinence type - mirabegron ER (MYRBETRIQ) 25 MG TB24 tablet; Take 1 tablet (25 mg total) by mouth daily.  Dispense: 30 tablet; Refill: 11   Continue all meds Labs pending Health Maintenance reviewed Diet and exercise encouraged RTO 3 months  Evelina Dun, FNP

## 2014-07-18 NOTE — Patient Instructions (Signed)

## 2014-07-19 LAB — CMP14+EGFR
ALT: 13 IU/L (ref 0–32)
AST: 19 IU/L (ref 0–40)
Albumin/Globulin Ratio: 1.7 (ref 1.1–2.5)
Albumin: 4.5 g/dL (ref 3.5–4.8)
Alkaline Phosphatase: 39 IU/L (ref 39–117)
BUN/Creatinine Ratio: 16 (ref 11–26)
BUN: 12 mg/dL (ref 8–27)
Bilirubin Total: 0.3 mg/dL (ref 0.0–1.2)
CO2: 24 mmol/L (ref 18–29)
Calcium: 10.2 mg/dL (ref 8.7–10.3)
Chloride: 96 mmol/L — ABNORMAL LOW (ref 97–108)
Creatinine, Ser: 0.77 mg/dL (ref 0.57–1.00)
GFR calc Af Amer: 90 mL/min/{1.73_m2} (ref >59)
GFR calc non Af Amer: 78 mL/min/{1.73_m2} (ref >59)
Globulin, Total: 2.7 g/dL (ref 1.5–4.5)
Glucose: 154 mg/dL — ABNORMAL HIGH (ref 65–99)
Potassium: 3.8 mmol/L (ref 3.5–5.2)
Sodium: 141 mmol/L (ref 134–144)
Total Protein: 7.2 g/dL (ref 6.0–8.5)

## 2014-07-19 LAB — THYROID PANEL WITH TSH
Free Thyroxine Index: 3.1 (ref 1.2–4.9)
T3 Uptake Ratio: 35 % (ref 24–39)
T4, Total: 8.8 ug/dL (ref 4.5–12.0)
TSH: 1.04 u[IU]/mL (ref 0.450–4.500)

## 2014-07-19 LAB — LIPID PANEL
Chol/HDL Ratio: 2.2 ratio units (ref 0.0–4.4)
Cholesterol, Total: 193 mg/dL (ref 100–199)
HDL: 89 mg/dL (ref >39)
LDL Calculated: 94 mg/dL (ref 0–99)
Triglycerides: 48 mg/dL (ref 0–149)
VLDL Cholesterol Cal: 10 mg/dL (ref 5–40)

## 2014-08-05 ENCOUNTER — Telehealth: Payer: Self-pay | Admitting: Family

## 2014-08-05 NOTE — Telephone Encounter (Signed)
Patient wants to know if you will be ok with her to take IBgard OTC for her constipation?

## 2014-08-05 NOTE — Telephone Encounter (Signed)
Pt can try. If she starts to have any adverse effects pt should stop

## 2014-08-05 NOTE — Telephone Encounter (Signed)
Aware,ok to take OTC medication.

## 2014-08-06 ENCOUNTER — Other Ambulatory Visit: Payer: Self-pay | Admitting: Family

## 2014-08-06 ENCOUNTER — Other Ambulatory Visit: Payer: Self-pay | Admitting: *Deleted

## 2014-08-06 DIAGNOSIS — F419 Anxiety disorder, unspecified: Secondary | ICD-10-CM

## 2014-08-06 DIAGNOSIS — F329 Major depressive disorder, single episode, unspecified: Secondary | ICD-10-CM

## 2014-08-06 DIAGNOSIS — F32A Depression, unspecified: Secondary | ICD-10-CM

## 2014-08-06 MED ORDER — DIAZEPAM 5 MG PO TABS: 5.0000 mg | ORAL_TABLET | Freq: Two times a day (BID) | ORAL | Status: DC | PRN

## 2014-08-06 NOTE — Telephone Encounter (Signed)
RX called into [facility] Pt notified

## 2014-08-06 NOTE — Telephone Encounter (Signed)
Last filled 05/16/14, last seen 07/18/14, Route to pool, call into [facility]

## 2014-08-19 ENCOUNTER — Other Ambulatory Visit: Payer: Self-pay | Admitting: Family

## 2014-08-21 ENCOUNTER — Other Ambulatory Visit: Payer: Self-pay | Admitting: Family

## 2014-08-22 NOTE — Telephone Encounter (Signed)
Left authorization on voicemail 

## 2014-08-22 NOTE — Telephone Encounter (Signed)
Last seen 07/18/14 Denise Gardner  Last filled #30 08/06/14  If approved route to nurse to call into [facility]  [phone]

## 2014-08-24 ENCOUNTER — Other Ambulatory Visit: Payer: Self-pay | Admitting: Family Medicine

## 2014-08-28 ENCOUNTER — Telehealth: Payer: Self-pay | Admitting: Family

## 2014-08-28 ENCOUNTER — Other Ambulatory Visit: Payer: Self-pay

## 2014-08-28 DIAGNOSIS — K2100 Gastro-esophageal reflux disease with esophagitis, without bleeding: Secondary | ICD-10-CM

## 2014-08-28 MED ORDER — OMEPRAZOLE 20 MG PO CPDR: 20.0000 mg | DELAYED_RELEASE_CAPSULE | Freq: Two times a day (BID) | ORAL | Status: DC

## 2014-08-28 MED ORDER — MISOPROSTOL 200 MCG PO TABS: ORAL_TABLET | ORAL | Status: DC

## 2014-09-05 ENCOUNTER — Encounter: Payer: Self-pay | Admitting: Family Medicine

## 2014-09-05 ENCOUNTER — Telehealth: Payer: Self-pay | Admitting: Family

## 2014-09-05 DIAGNOSIS — K2100 Gastro-esophageal reflux disease with esophagitis, without bleeding: Secondary | ICD-10-CM

## 2014-09-05 NOTE — Telephone Encounter (Signed)
Please review

## 2014-09-06 MED ORDER — MISOPROSTOL 200 MCG PO TABS: 200.0000 ug | ORAL_TABLET | Freq: Four times a day (QID) | ORAL | Status: DC

## 2014-09-06 NOTE — Telephone Encounter (Signed)
Prescription sent to pharmacy.

## 2014-09-12 ENCOUNTER — Telehealth: Payer: Self-pay | Admitting: Family

## 2014-09-13 ENCOUNTER — Other Ambulatory Visit: Payer: Self-pay | Admitting: Nurse Practitioner

## 2014-09-16 ENCOUNTER — Other Ambulatory Visit: Payer: Self-pay | Admitting: Family

## 2014-09-16 DIAGNOSIS — K2100 Gastro-esophageal reflux disease with esophagitis, without bleeding: Secondary | ICD-10-CM

## 2014-09-16 MED ORDER — MISOPROSTOL 200 MCG PO TABS: 200.0000 ug | ORAL_TABLET | Freq: Four times a day (QID) | ORAL | Status: DC

## 2014-09-22 ENCOUNTER — Other Ambulatory Visit: Payer: Self-pay | Admitting: Family

## 2014-10-06 ENCOUNTER — Other Ambulatory Visit: Payer: Self-pay | Admitting: Family

## 2014-10-19 ENCOUNTER — Other Ambulatory Visit: Payer: Self-pay | Admitting: Family

## 2014-10-23 ENCOUNTER — Ambulatory Visit (INDEPENDENT_AMBULATORY_CARE_PROVIDER_SITE_OTHER): Payer: Commercial Managed Care - HMO | Admitting: Family

## 2014-10-23 ENCOUNTER — Encounter: Payer: Self-pay | Admitting: Family

## 2014-10-23 ENCOUNTER — Ambulatory Visit (INDEPENDENT_AMBULATORY_CARE_PROVIDER_SITE_OTHER): Payer: Commercial Managed Care - HMO

## 2014-10-23 VITALS — BP 136/78 | HR 83 | Temp 97.0°F | Ht 66.0 in | Wt 188.0 lb

## 2014-10-23 DIAGNOSIS — E1140 Type 2 diabetes mellitus with diabetic neuropathy, unspecified: Secondary | ICD-10-CM

## 2014-10-23 DIAGNOSIS — E785 Hyperlipidemia, unspecified: Secondary | ICD-10-CM

## 2014-10-23 DIAGNOSIS — M899 Disorder of bone, unspecified: Secondary | ICD-10-CM

## 2014-10-23 DIAGNOSIS — F419 Anxiety disorder, unspecified: Secondary | ICD-10-CM | POA: Diagnosis not present

## 2014-10-23 DIAGNOSIS — K219 Gastro-esophageal reflux disease without esophagitis: Secondary | ICD-10-CM | POA: Diagnosis not present

## 2014-10-23 DIAGNOSIS — N3281 Overactive bladder: Secondary | ICD-10-CM

## 2014-10-23 DIAGNOSIS — I10 Essential (primary) hypertension: Secondary | ICD-10-CM | POA: Diagnosis not present

## 2014-10-23 DIAGNOSIS — G629 Polyneuropathy, unspecified: Secondary | ICD-10-CM | POA: Diagnosis not present

## 2014-10-23 DIAGNOSIS — E1149 Type 2 diabetes mellitus with other diabetic neurological complication: Secondary | ICD-10-CM

## 2014-10-23 DIAGNOSIS — L602 Onychogryphosis: Secondary | ICD-10-CM

## 2014-10-23 DIAGNOSIS — E039 Hypothyroidism, unspecified: Secondary | ICD-10-CM | POA: Diagnosis not present

## 2014-10-23 DIAGNOSIS — F329 Major depressive disorder, single episode, unspecified: Secondary | ICD-10-CM

## 2014-10-23 DIAGNOSIS — M8580 Other specified disorders of bone density and structure, unspecified site: Secondary | ICD-10-CM

## 2014-10-23 DIAGNOSIS — Z780 Asymptomatic menopausal state: Secondary | ICD-10-CM

## 2014-10-23 DIAGNOSIS — I25119 Atherosclerotic heart disease of native coronary artery with unspecified angina pectoris: Secondary | ICD-10-CM

## 2014-10-23 DIAGNOSIS — J4520 Mild intermittent asthma, uncomplicated: Secondary | ICD-10-CM | POA: Diagnosis not present

## 2014-10-23 DIAGNOSIS — L609 Nail disorder, unspecified: Secondary | ICD-10-CM

## 2014-10-23 DIAGNOSIS — F32A Depression, unspecified: Secondary | ICD-10-CM

## 2014-10-23 DIAGNOSIS — M1990 Unspecified osteoarthritis, unspecified site: Secondary | ICD-10-CM

## 2014-10-23 LAB — POCT GLYCOSYLATED HEMOGLOBIN (HGB A1C): Hemoglobin A1C: 6.8

## 2014-10-23 MED ORDER — DIAZEPAM 5 MG PO TABS: 5.0000 mg | ORAL_TABLET | Freq: Two times a day (BID) | ORAL | Status: DC | PRN

## 2014-10-23 MED ORDER — GLUCOSE BLOOD VI STRP: ORAL_STRIP | Status: DC

## 2014-10-23 MED ORDER — TRAMADOL HCL 50 MG PO TABS: 50.0000 mg | ORAL_TABLET | Freq: Two times a day (BID) | ORAL | Status: DC | PRN

## 2014-10-23 MED ORDER — POTASSIUM CHLORIDE CRYS ER 20 MEQ PO TBCR: 20.0000 meq | EXTENDED_RELEASE_TABLET | Freq: Every day | ORAL | Status: DC

## 2014-10-23 MED ORDER — ACCU-CHEK FASTCLIX LANCETS MISC: Status: DC

## 2014-10-23 MED ORDER — MONTELUKAST SODIUM 10 MG PO TABS: 10.0000 mg | ORAL_TABLET | Freq: Every day | ORAL | Status: DC

## 2014-10-23 MED ORDER — IPRATROPIUM-ALBUTEROL 0.5-2.5 (3) MG/3ML IN SOLN: RESPIRATORY_TRACT | Status: DC

## 2014-10-23 NOTE — Progress Notes (Signed)
Subjective:    Patient ID: Denise Gardner, female    DOB: Jul 01, 1945, 69 y.o.   MRN: [id number]  Pt presents to the office today for chronic follow up. Pt is complaining of left shoulder pain today. Pt states she has had this pain for the last three weeks. Pt states she feels like this is her arthritis. PT states she has bilateral hip pain, back, and intermittent knee pain.  Pt states this constant aching pain is a 10 out 10.  Diabetes She presents for her follow-up diabetic visit. She has type 2 diabetes mellitus. Her disease course has been worsening. Hypoglycemia symptoms include nervousness/anxiousness. Pertinent negatives for hypoglycemia include no confusion, headaches, mood changes or sleepiness. Associated symptoms include foot paresthesias and visual change. Pertinent negatives for diabetes include no foot ulcerations. Pertinent negatives for hypoglycemia complications include no blackouts and no hospitalization. Symptoms are worsening. Diabetic complications include heart disease and peripheral neuropathy. Pertinent negatives for diabetic complications include no CVA or nephropathy. Risk factors for coronary artery disease include diabetes mellitus, dyslipidemia, family history, hypertension, obesity, sedentary lifestyle and post-menopausal. Current diabetic treatment includes oral agent (triple therapy). She is compliant with treatment all of the time. She is following a generally unhealthy diet. Her breakfast blood glucose range is generally >200 mg/dl. An ACE inhibitor/angiotensin II receptor blocker is not being taken. Eye exam is not current.  Hypertension This is a chronic problem. The current episode started more than 1 year ago. The problem has been resolved since onset. The problem is controlled. Associated symptoms include anxiety, peripheral edema ("at times") and shortness of breath. Pertinent negatives include no headaches or palpitations. Risk factors for coronary artery disease  include diabetes mellitus, dyslipidemia, family history, obesity, post-menopausal state and sedentary lifestyle. Past treatments include diuretics and direct vasodilators. Hypertensive end-organ damage includes heart failure. There is no history of kidney disease, CAD/MI, CVA or a thyroid problem. There is no history of sleep apnea.  Hyperlipidemia This is a chronic problem. The current episode started more than 1 year ago. The problem is controlled. Recent lipid tests were reviewed and are normal. Exacerbating diseases include diabetes and hypothyroidism. Associated symptoms include shortness of breath. Current antihyperlipidemic treatment includes herbal therapy. The current treatment provides moderate improvement of lipids. Risk factors for coronary artery disease include diabetes mellitus, dyslipidemia, family history, hypertension, obesity, a sedentary lifestyle and post-menopausal.  Anxiety Presents for follow-up visit. Onset was 1 to 6 months ago. The problem has been waxing and waning. Symptoms include excessive worry, nervous/anxious behavior and shortness of breath. Patient reports no confusion, insomnia, irritability or palpitations. Symptoms occur occasionally.   Her past medical history is significant for anxiety/panic attacks, asthma and depression. Past treatments include nothing. The treatment provided mild relief. Compliance with prior treatments has been good.  Gastrophageal Reflux She complains of a hoarse voice. She reports no belching, no coughing, no heartburn, no sore throat or no wheezing. This is a chronic problem. The current episode started more than 1 year ago. The problem occurs rarely. The problem has been resolved. The symptoms are aggravated by certain foods. She has tried a PPI for the symptoms. The treatment provided significant relief.  Asthma She complains of frequent throat clearing, hoarse voice and shortness of breath. There is no cough or wheezing. This is a chronic  problem. The current episode started more than 1 year ago. The problem occurs intermittently. The problem has been waxing and waning. Pertinent negatives include no headaches, heartburn or sore throat. Her  symptoms are aggravated by change in weather. Her symptoms are alleviated by rest and steroid inhaler. She reports moderate improvement on treatment. Her past medical history is significant for asthma and COPD.  Thyroid Problem Presents for follow-up visit. Symptoms include anxiety, constipation, dry skin, hoarse voice and visual change. Patient reports no diarrhea or palpitations. The symptoms have been stable. Past treatments include levothyroxine. The treatment provided significant relief. Her past medical history is significant for diabetes, heart failure and hyperlipidemia.  Peripheral Neuropathy Pt currently taking gabapentin 100mg TID. Pt would like to discuss increasing the dose today. Pt is having a lot of pain and numbness in legs.    Review of Systems  Constitutional: Negative.  Negative for irritability.  HENT: Positive for hoarse voice. Negative for sore throat.   Eyes: Negative.   Respiratory: Positive for shortness of breath. Negative for cough and wheezing.   Cardiovascular: Negative.  Negative for palpitations.  Gastrointestinal: Positive for constipation. Negative for heartburn and diarrhea.  Endocrine: Negative.   Genitourinary: Negative.   Musculoskeletal: Negative.   Neurological: Negative.  Negative for headaches.  Hematological: Negative.   Psychiatric/Behavioral: Negative for confusion. The patient is nervous/anxious. The patient does not have insomnia.   All other systems reviewed and are negative.      Objective:   Physical Exam  Constitutional: She is oriented to person, place, and time. She appears well-developed and well-nourished. No distress.  HENT:  Head: Normocephalic and atraumatic.  Right Ear: External ear normal.  Left Ear: External ear normal.    Nose: Nose normal.  Mouth/Throat: Oropharynx is clear and moist.  Eyes: Pupils are equal, round, and reactive to light.  Neck: Normal range of motion. Neck supple. No thyromegaly present.  Cardiovascular: Normal rate, regular rhythm, normal heart sounds and intact distal pulses.   No murmur heard. Pulmonary/Chest: Effort normal and breath sounds normal. No respiratory distress. She has no wheezes.  Abdominal: Soft. Bowel sounds are normal. She exhibits no distension. There is no tenderness.  Musculoskeletal: Normal range of motion. She exhibits edema (trace amt in BLE). She exhibits no tenderness.  Full ROM of left shoulder- Slow movements related to pain   Neurological: She is alert and oriented to person, place, and time. She has normal reflexes. No cranial nerve deficit.  Skin: Skin is warm and dry.  Psychiatric: She has a normal mood and affect. Her behavior is normal. Judgment and thought content normal.  Vitals reviewed.     BP 136/78 mmHg  Pulse 83  Temp(Src) 97 F (36.1 C) (Oral)  Ht 5' 6" (1.676 m)  Wt 188 lb (85.276 kg)  BMI 30.36 kg/m2     Assessment & Plan:  1. Asthma, mild intermittent, uncomplicate - montelukast (SINGULAIR) 10 MG tablet; Take 1 tablet (10 mg total) by mouth at bedtime.  Dispense: 30 tablet; Refill: 3 - ipratropium-albuterol (DUONEB) 0.5-2.5 (3) MG/3ML SOLN; USE 1 VIAL IN NEBULIZER EVERY 6 HOURS  Dispense: 1080 mL; Refill: 5 - CMP14+EGFR  2. Essential hypertension - CMP14+EGFR  3. Coronary artery disease involving native coronary artery of native heart with angina pectoris - glucose blood (ACCU-CHEK AVIVA PLUS) test strip; USE TO TEST BLOOD SUGAR TWICE DAILY AS DIRECTED  Dispense: 100 each; Refill: 6 - CMP14+EGFR  4. Diabetes mellitus type 2 with neurological manifestations - glucose blood (ACCU-CHEK AVIVA PLUS) test strip; USE TO TEST BLOOD SUGAR TWICE DAILY AS DIRECTED  Dispense: 100 each; Refill: 6 - ACCU-CHEK FASTCLIX LANCETS MISC; Test  blood sugar bid. Dx   E11.40  Dispense: 102 each; Refill: 5 - Ambulatory referral to Ophthalmology - POCT glycosylated hemoglobin (Hb A1C) - CMP14+EGFR - Ambulatory referral to Podiatry  5. Hypothyroidism, unspecified hypothyroidism type - CMP14+EGFR - Thyroid Panel With TSH  6. Gastroesophageal reflux disease, esophagitis presence not specified - CMP14+EGFR  7. Osteopenia - CMP14+EGFR - DG Bone Density; Future  8. OAB (overactive bladder) - CMP14+EGFR  9. Anxiety - CMP14+EGFR - diazepam (VALIUM) 5 MG tablet; Take 1 tablet (5 mg total) by mouth every 12 (twelve) hours as needed. for anxiety  Dispense: 60 tablet; Refill: 2  10. Hyperlipidemia - CMP14+EGFR - Lipid panel  11. Depression - CMP14+EGFR  12. Peripheral neuropathy - CMP14+EGFR  13. Post-menopausal - CMP14+EGFR - DG Bone Density; Future  14. Arthritis - CMP14+EGFR - traMADol (ULTRAM) 50 MG tablet; Take 1 tablet (50 mg total) by mouth every 12 (twelve) hours as needed.  Dispense: 60 tablet; Refill: 0  15. Long toenail - Ambulatory referral to Podiatry   Continue all meds Labs pending Health Maintenance reviewed Diet and exercise encouraged RTO 3 months   , FNP   

## 2014-10-23 NOTE — Patient Instructions (Signed)

## 2014-10-24 LAB — CMP14+EGFR
ALT: 41 IU/L — ABNORMAL HIGH (ref 0–32)
AST: 53 IU/L — ABNORMAL HIGH (ref 0–40)
Albumin/Globulin Ratio: 1.4 (ref 1.1–2.5)
Albumin: 3.8 g/dL (ref 3.5–4.8)
Alkaline Phosphatase: 61 IU/L (ref 39–117)
BUN/Creatinine Ratio: 13 (ref 11–26)
BUN: 11 mg/dL (ref 8–27)
Bilirubin Total: 0.3 mg/dL (ref 0.0–1.2)
CO2: 25 mmol/L (ref 18–29)
Calcium: 10 mg/dL (ref 8.7–10.3)
Chloride: 96 mmol/L — ABNORMAL LOW (ref 97–108)
Creatinine, Ser: 0.86 mg/dL (ref 0.57–1.00)
GFR calc Af Amer: 79 mL/min/{1.73_m2} (ref >59)
GFR calc non Af Amer: 68 mL/min/{1.73_m2} (ref >59)
Globulin, Total: 2.8 g/dL (ref 1.5–4.5)
Glucose: 119 mg/dL — ABNORMAL HIGH (ref 65–99)
Potassium: 3.9 mmol/L (ref 3.5–5.2)
Sodium: 140 mmol/L (ref 134–144)
Total Protein: 6.6 g/dL (ref 6.0–8.5)

## 2014-10-24 LAB — LIPID PANEL
Chol/HDL Ratio: 2.5 ratio units (ref 0.0–4.4)
Cholesterol, Total: 160 mg/dL (ref 100–199)
HDL: 65 mg/dL (ref >39)
LDL Calculated: 81 mg/dL (ref 0–99)
Triglycerides: 72 mg/dL (ref 0–149)
VLDL Cholesterol Cal: 14 mg/dL (ref 5–40)

## 2014-10-24 LAB — THYROID PANEL WITH TSH
Free Thyroxine Index: 1.8 (ref 1.2–4.9)
T3 Uptake Ratio: 44 % — ABNORMAL HIGH (ref 24–39)
T4, Total: 4.1 ug/dL — ABNORMAL LOW (ref 4.5–12.0)
TSH: 0.558 u[IU]/mL (ref 0.450–4.500)

## 2014-10-29 ENCOUNTER — Other Ambulatory Visit: Payer: Self-pay | Admitting: Family

## 2014-11-17 ENCOUNTER — Ambulatory Visit (HOSPITAL_COMMUNITY)
Admission: RE | Admit: 2014-11-17 | Discharge: 2014-11-17 | Disposition: A | Discharge status: Home / Self Care | Payer: Commercial Managed Care - HMO | Source: Ambulatory Visit | Attending: Family | Admitting: Family

## 2014-11-17 ENCOUNTER — Ambulatory Visit (INDEPENDENT_AMBULATORY_CARE_PROVIDER_SITE_OTHER): Payer: Commercial Managed Care - HMO

## 2014-11-17 ENCOUNTER — Encounter: Payer: Self-pay | Admitting: Family

## 2014-11-17 ENCOUNTER — Ambulatory Visit (INDEPENDENT_AMBULATORY_CARE_PROVIDER_SITE_OTHER): Payer: Commercial Managed Care - HMO | Admitting: Family

## 2014-11-17 VITALS — BP 108/72 | HR 77 | Temp 97.1°F | Ht 66.0 in

## 2014-11-17 DIAGNOSIS — Y92019 Unspecified place in single-family (private) house as the place of occurrence of the external cause: Secondary | ICD-10-CM | POA: Diagnosis not present

## 2014-11-17 DIAGNOSIS — R42 Dizziness and giddiness: Secondary | ICD-10-CM | POA: Insufficient documentation

## 2014-11-17 DIAGNOSIS — R51 Headache: Secondary | ICD-10-CM

## 2014-11-17 DIAGNOSIS — W01198A Fall on same level from slipping, tripping and stumbling with subsequent striking against other object, initial encounter: Secondary | ICD-10-CM

## 2014-11-17 DIAGNOSIS — W19XXXA Unspecified fall, initial encounter: Secondary | ICD-10-CM | POA: Diagnosis not present

## 2014-11-17 DIAGNOSIS — Y92009 Unspecified place in unspecified non-institutional (private) residence as the place of occurrence of the external cause: Secondary | ICD-10-CM

## 2014-11-17 DIAGNOSIS — S82891A Other fracture of right lower leg, initial encounter for closed fracture: Secondary | ICD-10-CM | POA: Diagnosis not present

## 2014-11-17 DIAGNOSIS — S2341XS Sprain of ribs, sequela: Secondary | ICD-10-CM | POA: Diagnosis not present

## 2014-11-17 DIAGNOSIS — Y92099 Unspecified place in other non-institutional residence as the place of occurrence of the external cause: Secondary | ICD-10-CM | POA: Diagnosis not present

## 2014-11-17 DIAGNOSIS — R519 Headache, unspecified: Secondary | ICD-10-CM

## 2014-11-17 NOTE — Patient Instructions (Signed)

## 2014-11-17 NOTE — Progress Notes (Signed)
Subjective:    Patient ID: Denise Gardner, female    DOB: 04/26/1945, 69 y.o.   MRN: [id number]   Pt presents to the office today from a fall on Saturday. Pt states she fell Saturday backwards and hit her head and hit her kitchen cabinet and then the floor. Pt states her ribs and right ankle is extremely painful. PT states she lives with her sister, but states she has not ate in a few days because she does not have anyone to fix her meals. Pt states her sister cooks her own meals and the patient cooks her own, but since the fall has been unable to care for herself.  Fall The accident occurred 3 to 5 days ago. The fall occurred while standing. Impact surface: kitchen cabient, then floor. There was no blood loss. The point of impact was the head (back, right ankle). The pain is present in the head (rib pain, right ankle). The pain is at a severity of 10/10. The pain is mild. The symptoms are aggravated by ambulation and movement. Associated symptoms include headaches and nausea. Pertinent negatives include no bowel incontinence, hematuria, numbness, tingling or vomiting. She has tried rest for the symptoms. The treatment provided no relief.      Review of Systems  Constitutional: Negative.   Eyes: Negative.   Respiratory: Negative.  Negative for shortness of breath.   Cardiovascular: Negative.  Negative for palpitations.  Gastrointestinal: Positive for nausea. Negative for vomiting and bowel incontinence.  Endocrine: Negative.   Genitourinary: Negative.  Negative for hematuria.  Musculoskeletal: Negative.   Neurological: Positive for headaches. Negative for tingling and numbness.  Hematological: Negative.   Psychiatric/Behavioral: Negative.   All other systems reviewed and are negative.      Objective:   Physical Exam  Constitutional: She is oriented to person, place, and time. She appears well-developed and well-nourished. No distress.  HENT:  Head: Normocephalic.  Eyes glassy,  strabismus  Eyes: Pupils are equal, round, and reactive to light.  Neck: Normal range of motion. Neck supple. No thyromegaly present.  Cardiovascular: Normal rate, regular rhythm, normal heart sounds and intact distal pulses.   No murmur heard. Pulmonary/Chest: Effort normal and breath sounds normal. No respiratory distress. She has no wheezes.  Abdominal: Soft. Bowel sounds are normal. She exhibits no distension. There is no tenderness.  Musculoskeletal: Normal range of motion. She exhibits edema (right ankle) and tenderness.  Neurological: She is alert and oriented to person, place, and time. She has normal reflexes. No cranial nerve deficit.  Skin: Skin is warm and dry. There is pallor.  Psychiatric: She has a normal mood and affect. Her behavior is normal. Judgment and thought content normal.  Vitals reviewed.   Right ankle fracture- Preliminary reading by Evelina Dun, FNP [facility]  Rib WNL Preliminary reading by Evelina Dun, FNP [facility]   BP 108/72 mmHg  Pulse 77  Temp(Src) 97.1 F (36.2 C) (Oral)  Ht 5\' 6"  (1.676 m)     Assessment & Plan:  1. Fall, initial encounter - DG Ankle Complete Right; Future - DG Ribs Unilateral W/Chest Right; Future  2. Fall at home, initial encounter - CT Head Wo Contrast; Future  3. Nonintractable headache, unspecified chronicity pattern, unspecified headache type - CT Head Wo Contrast; Future  4. Ankle fracture, right - Ambulatory referral to Orthopedic Surgery   Falls precaution discussed Long discussion about SNF with pt- Pt states she does not need this at this time and states she "has too much  to do". Pt states she takes care of her mother at times.  Pt has ortho appt Wednesday- PT told to avoid walking on foot as much as possible to discussed injury prevention  CT scan pending  Evelina Dun, FNP

## 2014-11-26 DIAGNOSIS — J690 Pneumonitis due to inhalation of food and vomit: Secondary | ICD-10-CM | POA: Diagnosis not present

## 2014-11-26 DIAGNOSIS — R6520 Severe sepsis without septic shock: Secondary | ICD-10-CM | POA: Diagnosis not present

## 2014-11-26 DIAGNOSIS — J45909 Unspecified asthma, uncomplicated: Secondary | ICD-10-CM | POA: Diagnosis not present

## 2014-11-26 DIAGNOSIS — I2699 Other pulmonary embolism without acute cor pulmonale: Secondary | ICD-10-CM | POA: Diagnosis not present

## 2014-11-26 DIAGNOSIS — R000 Tachycardia, unspecified: Secondary | ICD-10-CM | POA: Diagnosis not present

## 2014-11-26 DIAGNOSIS — E872 Acidosis: Secondary | ICD-10-CM | POA: Diagnosis not present

## 2014-11-26 DIAGNOSIS — J440 Chronic obstructive pulmonary disease with acute lower respiratory infection: Secondary | ICD-10-CM | POA: Diagnosis not present

## 2014-11-26 DIAGNOSIS — G92 Toxic encephalopathy: Secondary | ICD-10-CM | POA: Diagnosis not present

## 2014-11-26 DIAGNOSIS — J9601 Acute respiratory failure with hypoxia: Secondary | ICD-10-CM | POA: Diagnosis not present

## 2014-11-26 DIAGNOSIS — A419 Sepsis, unspecified organism: Secondary | ICD-10-CM | POA: Diagnosis not present

## 2014-12-08 ENCOUNTER — Telehealth: Payer: Self-pay | Admitting: Family

## 2014-12-08 DIAGNOSIS — K2100 Gastro-esophageal reflux disease with esophagitis, without bleeding: Secondary | ICD-10-CM

## 2014-12-08 NOTE — Telephone Encounter (Signed)
dont see a pee pill?

## 2014-12-09 MED ORDER — METFORMIN HCL 1000 MG PO TABS: ORAL_TABLET | ORAL | Status: DC

## 2014-12-09 MED ORDER — TRIAMTERENE-HCTZ 37.5-25 MG PO CAPS: 1.0000 | ORAL_CAPSULE | Freq: Every morning | ORAL | Status: DC

## 2014-12-09 MED ORDER — NIACIN ER (ANTIHYPERLIPIDEMIC) 1000 MG PO TBCR: 2000.0000 mg | EXTENDED_RELEASE_TABLET | Freq: Every day | ORAL | Status: DC

## 2014-12-10 ENCOUNTER — Other Ambulatory Visit: Payer: Self-pay | Admitting: Family

## 2015-01-01 ENCOUNTER — Telehealth: Payer: Self-pay | Admitting: Family

## 2015-01-01 MED ORDER — ONDANSETRON HCL 4 MG PO TABS: 4.0000 mg | ORAL_TABLET | Freq: Three times a day (TID) | ORAL | Status: DC | PRN

## 2015-01-01 NOTE — Telephone Encounter (Signed)
Zofran Prescription sent to pharmacy  ° °

## 2015-01-01 NOTE — Telephone Encounter (Signed)
Pt aware.

## 2015-01-07 ENCOUNTER — Telehealth: Payer: Self-pay | Admitting: Family

## 2015-01-19 ENCOUNTER — Other Ambulatory Visit: Payer: Self-pay | Admitting: Nurse Practitioner

## 2015-01-23 NOTE — Telephone Encounter (Signed)
Several attempts made to contact patient. This encounter will be closed.

## 2015-01-27 ENCOUNTER — Encounter: Payer: Self-pay | Admitting: Family

## 2015-01-27 ENCOUNTER — Ambulatory Visit (INDEPENDENT_AMBULATORY_CARE_PROVIDER_SITE_OTHER): Payer: Commercial Managed Care - HMO | Admitting: Family

## 2015-01-27 ENCOUNTER — Ambulatory Visit: Payer: Commercial Managed Care - HMO | Admitting: Family

## 2015-01-27 VITALS — BP 136/90 | HR 81 | Temp 98.1°F | Ht 66.0 in | Wt 185.0 lb

## 2015-01-27 DIAGNOSIS — E039 Hypothyroidism, unspecified: Secondary | ICD-10-CM

## 2015-01-27 DIAGNOSIS — E785 Hyperlipidemia, unspecified: Secondary | ICD-10-CM | POA: Diagnosis not present

## 2015-01-27 DIAGNOSIS — I10 Essential (primary) hypertension: Secondary | ICD-10-CM

## 2015-01-27 DIAGNOSIS — F329 Major depressive disorder, single episode, unspecified: Secondary | ICD-10-CM | POA: Diagnosis not present

## 2015-01-27 DIAGNOSIS — M8580 Other specified disorders of bone density and structure, unspecified site: Secondary | ICD-10-CM

## 2015-01-27 DIAGNOSIS — J4520 Mild intermittent asthma, uncomplicated: Secondary | ICD-10-CM | POA: Diagnosis not present

## 2015-01-27 DIAGNOSIS — J449 Chronic obstructive pulmonary disease, unspecified: Secondary | ICD-10-CM | POA: Diagnosis not present

## 2015-01-27 DIAGNOSIS — E1149 Type 2 diabetes mellitus with other diabetic neurological complication: Secondary | ICD-10-CM

## 2015-01-27 DIAGNOSIS — F419 Anxiety disorder, unspecified: Secondary | ICD-10-CM

## 2015-01-27 DIAGNOSIS — F32A Depression, unspecified: Secondary | ICD-10-CM

## 2015-01-27 DIAGNOSIS — N3281 Overactive bladder: Secondary | ICD-10-CM

## 2015-01-27 DIAGNOSIS — Z1159 Encounter for screening for other viral diseases: Secondary | ICD-10-CM

## 2015-01-27 DIAGNOSIS — G6289 Other specified polyneuropathies: Secondary | ICD-10-CM | POA: Diagnosis not present

## 2015-01-27 DIAGNOSIS — I25119 Atherosclerotic heart disease of native coronary artery with unspecified angina pectoris: Secondary | ICD-10-CM | POA: Diagnosis not present

## 2015-01-27 DIAGNOSIS — Z1211 Encounter for screening for malignant neoplasm of colon: Secondary | ICD-10-CM

## 2015-01-27 LAB — POCT GLYCOSYLATED HEMOGLOBIN (HGB A1C): Hemoglobin A1C: 8.2

## 2015-01-27 MED ORDER — CYCLOBENZAPRINE HCL 5 MG PO TABS: 5.0000 mg | ORAL_TABLET | Freq: Three times a day (TID) | ORAL | Status: DC | PRN

## 2015-01-27 NOTE — Addendum Note (Signed)
Addended by: Evelina Dun A on: 01/27/2015 02:39 PM   Modules accepted: Orders

## 2015-01-27 NOTE — Patient Instructions (Signed)
Health Maintenance, Female Adopting a healthy lifestyle and getting preventive care can go a long way to promote health and wellness. Talk with your health care provider about what schedule of regular examinations is right for you. This is a good chance for you to check in with your provider about disease prevention and staying healthy. In between checkups, there are plenty of things you can do on your own. Experts have done a lot of research about which lifestyle changes and preventive measures are most likely to keep you healthy. Ask your health care provider for more information. WEIGHT AND DIET  Eat a healthy diet  Be sure to include plenty of vegetables, fruits, low-fat dairy products, and lean protein.  Do not eat a lot of foods high in solid fats, added sugars, or salt.  Get regular exercise. This is one of the most important things you can do for your health.  Most adults should exercise for at least 150 minutes each week. The exercise should increase your heart rate and make you sweat (moderate-intensity exercise).  Most adults should also do strengthening exercises at least twice a week. This is in addition to the moderate-intensity exercise.  Maintain a healthy weight  Body mass index (BMI) is a measurement that can be used to identify possible weight problems. It estimates body fat based on height and weight. Your health care provider can help determine your BMI and help you achieve or maintain a healthy weight.  For females 20 years of age and older:   A BMI below 18.5 is considered underweight.  A BMI of 18.5 to 24.9 is normal.  A BMI of 25 to 29.9 is considered overweight.  A BMI of 30 and above is considered obese.  Watch levels of cholesterol and blood lipids  You should start having your blood tested for lipids and cholesterol at 71 years of age, then have this test every 5 years.  You may need to have your cholesterol levels checked more often if:  Your lipid  or cholesterol levels are high.  You are older than 71 years of age.  You are at high risk for heart disease.  CANCER SCREENING   Lung Cancer  Lung cancer screening is recommended for adults 55-80 years old who are at high risk for lung cancer because of a history of smoking.  A yearly low-dose CT scan of the lungs is recommended for people who:  Currently smoke.  Have quit within the past 15 years.  Have at least a 30-pack-year history of smoking. A pack year is smoking an average of one pack of cigarettes a day for 1 year.  Yearly screening should continue until it has been 15 years since you quit.  Yearly screening should stop if you develop a health problem that would prevent you from having lung cancer treatment.  Breast Cancer  Practice breast self-awareness. This means understanding how your breasts normally appear and feel.  It also means doing regular breast self-exams. Let your health care provider know about any changes, no matter how small.  If you are in your 20s or 30s, you should have a clinical breast exam (CBE) by a health care provider every 1-3 years as part of a regular health exam.  If you are 40 or older, have a CBE every year. Also consider having a breast X-ray (mammogram) every year.  If you have a family history of breast cancer, talk to your health care provider about genetic screening.  If you   are at high risk for breast cancer, talk to your health care provider about having an MRI and a mammogram every year.  Breast cancer gene (BRCA) assessment is recommended for women who have family members with BRCA-related cancers. BRCA-related cancers include:  Breast.  Ovarian.  Tubal.  Peritoneal cancers.  Results of the assessment will determine the need for genetic counseling and BRCA1 and BRCA2 testing. Cervical Cancer Your health care provider may recommend that you be screened regularly for cancer of the pelvic organs (ovaries, uterus, and  vagina). This screening involves a pelvic examination, including checking for microscopic changes to the surface of your cervix (Pap test). You may be encouraged to have this screening done every 3 years, beginning at age 21.  For women ages 30-65, health care providers may recommend pelvic exams and Pap testing every 3 years, or they may recommend the Pap and pelvic exam, combined with testing for human papilloma virus (HPV), every 5 years. Some types of HPV increase your risk of cervical cancer. Testing for HPV may also be done on women of any age with unclear Pap test results.  Other health care providers may not recommend any screening for nonpregnant women who are considered low risk for pelvic cancer and who do not have symptoms. Ask your health care provider if a screening pelvic exam is right for you.  If you have had past treatment for cervical cancer or a condition that could lead to cancer, you need Pap tests and screening for cancer for at least 20 years after your treatment. If Pap tests have been discontinued, your risk factors (such as having a new sexual partner) need to be reassessed to determine if screening should resume. Some women have medical problems that increase the chance of getting cervical cancer. In these cases, your health care provider may recommend more frequent screening and Pap tests. Colorectal Cancer  This type of cancer can be detected and often prevented.  Routine colorectal cancer screening usually begins at 71 years of age and continues through 71 years of age.  Your health care provider may recommend screening at an earlier age if you have risk factors for colon cancer.  Your health care provider may also recommend using home test kits to check for hidden blood in the stool.  A small camera at the end of a tube can be used to examine your colon directly (sigmoidoscopy or colonoscopy). This is done to check for the earliest forms of colorectal  cancer.  Routine screening usually begins at age 50.  Direct examination of the colon should be repeated every 5-10 years through 71 years of age. However, you may need to be screened more often if early forms of precancerous polyps or small growths are found. Skin Cancer  Check your skin from head to toe regularly.  Tell your health care provider about any new moles or changes in moles, especially if there is a change in a mole's shape or color.  Also tell your health care provider if you have a mole that is larger than the size of a pencil eraser.  Always use sunscreen. Apply sunscreen liberally and repeatedly throughout the day.  Protect yourself by wearing long sleeves, pants, a wide-brimmed hat, and sunglasses whenever you are outside. HEART DISEASE, DIABETES, AND HIGH BLOOD PRESSURE   High blood pressure causes heart disease and increases the risk of stroke. High blood pressure is more likely to develop in:  People who have blood pressure in the high end   of the normal range (130-139/85-89 mm Hg).  People who are overweight or obese.  People who are African American.  If you are 38-23 years of age, have your blood pressure checked every 3-5 years. If you are 61 years of age or older, have your blood pressure checked every year. You should have your blood pressure measured twice--once when you are at a hospital or clinic, and once when you are not at a hospital or clinic. Record the average of the two measurements. To check your blood pressure when you are not at a hospital or clinic, you can use:  An automated blood pressure machine at a pharmacy.  A home blood pressure monitor.  If you are between 45 years and 39 years old, ask your health care provider if you should take aspirin to prevent strokes.  Have regular diabetes screenings. This involves taking a blood sample to check your fasting blood sugar level.  If you are at a normal weight and have a low risk for diabetes,  have this test once every three years after 71 years of age.  If you are overweight and have a high risk for diabetes, consider being tested at a younger age or more often. PREVENTING INFECTION  Hepatitis B  If you have a higher risk for hepatitis B, you should be screened for this virus. You are considered at high risk for hepatitis B if:  You were born in a country where hepatitis B is common. Ask your health care provider which countries are considered high risk.  Your parents were born in a high-risk country, and you have not been immunized against hepatitis B (hepatitis B vaccine).  You have HIV or AIDS.  You use needles to inject street drugs.  You live with someone who has hepatitis B.  You have had sex with someone who has hepatitis B.  You get hemodialysis treatment.  You take certain medicines for conditions, including cancer, organ transplantation, and autoimmune conditions. Hepatitis C  Blood testing is recommended for:  Everyone born from [birth year] through 1967.  Anyone with known risk factors for hepatitis C. Sexually transmitted infections (STIs)  You should be screened for sexually transmitted infections (STIs) including gonorrhea and chlamydia if:  You are sexually active and are younger than 71 years of age.  You are older than 71 years of age and your health care provider tells you that you are at risk for this type of infection.  Your sexual activity has changed since you were last screened and you are at an increased risk for chlamydia or gonorrhea. Ask your health care provider if you are at risk.  If you do not have HIV, but are at risk, it may be recommended that you take a prescription medicine daily to prevent HIV infection. This is called pre-exposure prophylaxis (PrEP). You are considered at risk if:  You are sexually active and do not regularly use condoms or know the HIV status of your partner(s).  You take drugs by injection.  You are sexually  active with a partner who has HIV. Talk with your health care provider about whether you are at high risk of being infected with HIV. If you choose to begin PrEP, you should first be tested for HIV. You should then be tested every 3 months for as long as you are taking PrEP.  PREGNANCY   If you are premenopausal and you may become pregnant, ask your health care provider about preconception counseling.  If you may  become pregnant, take 400 to 800 micrograms (mcg) of folic acid every day.  If you want to prevent pregnancy, talk to your health care provider about birth control (contraception). OSTEOPOROSIS AND MENOPAUSE   Osteoporosis is a disease in which the bones lose minerals and strength with aging. This can result in serious bone fractures. Your risk for osteoporosis can be identified using a bone density scan.  If you are 61 years of age or older, or if you are at risk for osteoporosis and fractures, ask your health care provider if you should be screened.  Ask your health care provider whether you should take a calcium or vitamin D supplement to lower your risk for osteoporosis.  Menopause may have certain physical symptoms and risks.  Hormone replacement therapy may reduce some of these symptoms and risks. Talk to your health care provider about whether hormone replacement therapy is right for you.  HOME CARE INSTRUCTIONS   Schedule regular health, dental, and eye exams.  Stay current with your immunizations.   Do not use any tobacco products including cigarettes, chewing tobacco, or electronic cigarettes.  If you are pregnant, do not drink alcohol.  If you are breastfeeding, limit how much and how often you drink alcohol.  Limit alcohol intake to no more than 1 drink per day for nonpregnant women. One drink equals 12 ounces of beer, 5 ounces of wine, or 1 ounces of hard liquor.  Do not use street drugs.  Do not share needles.  Ask your health care provider for help if  you need support or information about quitting drugs.  Tell your health care provider if you often feel depressed.  Tell your health care provider if you have ever been abused or do not feel safe at home.   This information is not intended to replace advice given to you by your health care provider. Make sure you discuss any questions you have with your health care provider.   Document Released: 08/30/2010 Document Revised: 03/07/2014 Document Reviewed: 01/16/2013 Elsevier Interactive Patient Education [employer].

## 2015-01-27 NOTE — Progress Notes (Signed)
Subjective:    Patient ID: Denise Gardner, female    DOB: 1945/12/22, 69 y.o.   MRN: [id number]  PT presents to the office today for chronic follow up. Pt was admitted to [facility] for 6-7 days for CVA last month. Pt states since being discharge she feels a lot better. PT states she was given "steriods and got her appetite back". Pt states she's "starting to feel like her myself".  Diabetes She presents for her follow-up diabetic visit. She has type 2 diabetes mellitus. Her disease course has been worsening. Hypoglycemia symptoms include nervousness/anxiousness. Pertinent negatives for hypoglycemia include no confusion, headaches, mood changes or sleepiness. Associated symptoms include foot paresthesias and visual change. Pertinent negatives for diabetes include no foot ulcerations. Pertinent negatives for hypoglycemia complications include no blackouts and no hospitalization. Symptoms are worsening. Diabetic complications include heart disease and peripheral neuropathy. Pertinent negatives for diabetic complications include no CVA or nephropathy. Risk factors for coronary artery disease include diabetes mellitus, dyslipidemia, family history, hypertension, obesity, sedentary lifestyle and post-menopausal. Current diabetic treatment includes oral agent (triple therapy). She is compliant with treatment all of the time. She is following a generally unhealthy diet. Her breakfast blood glucose range is generally >200 mg/dl. An ACE inhibitor/angiotensin II receptor blocker is not being taken. Eye exam is not current.  Hypertension This is a chronic problem. The current episode started more than 1 year ago. The problem has been resolved since onset. The problem is controlled. Associated symptoms include anxiety, peripheral edema ("at times") and shortness of breath. Pertinent negatives include no headaches or palpitations. Risk factors for coronary artery disease include diabetes mellitus, dyslipidemia,  family history, obesity, post-menopausal state and sedentary lifestyle. Past treatments include beta blockers. Hypertensive end-organ damage includes heart failure. There is no history of kidney disease, CAD/MI, CVA or a thyroid problem. There is no history of sleep apnea.  Hyperlipidemia This is a chronic problem. The current episode started more than 1 year ago. The problem is controlled. Recent lipid tests were reviewed and are normal. Exacerbating diseases include diabetes and hypothyroidism. Associated symptoms include shortness of breath. Current antihyperlipidemic treatment includes herbal therapy. The current treatment provides moderate improvement of lipids. Risk factors for coronary artery disease include diabetes mellitus, dyslipidemia, family history, hypertension, obesity, a sedentary lifestyle and post-menopausal.  Anxiety Presents for follow-up visit. Onset was 1 to 6 months ago. The problem has been waxing and waning. Symptoms include excessive worry, nervous/anxious behavior and shortness of breath. Patient reports no confusion, insomnia, irritability or palpitations. Symptoms occur occasionally.   Her past medical history is significant for anxiety/panic attacks, asthma and depression. Past treatments include nothing. The treatment provided mild relief. Compliance with prior treatments has been good.  Gastroesophageal Reflux She complains of a hoarse voice. She reports no belching, no coughing, no heartburn, no sore throat or no wheezing. This is a chronic problem. The current episode started more than 1 year ago. The problem occurs rarely. The problem has been resolved. The symptoms are aggravated by certain foods. She has tried a PPI for the symptoms. The treatment provided significant relief.  Asthma She complains of frequent throat clearing, hoarse voice and shortness of breath. There is no cough or wheezing. This is a chronic problem. The current episode started more than 1 year ago.  The problem occurs intermittently. The problem has been waxing and waning. Pertinent negatives include no headaches, heartburn or sore throat. Her symptoms are aggravated by change in weather. Her symptoms are alleviated by  rest and steroid inhaler. She reports moderate improvement on treatment. Her past medical history is significant for asthma and COPD.  Thyroid Problem Presents for follow-up visit. Symptoms include anxiety, constipation, dry skin, hoarse voice and visual change. Patient reports no diarrhea or palpitations. The symptoms have been stable. Past treatments include levothyroxine. The treatment provided significant relief. Her past medical history is significant for diabetes, heart failure and hyperlipidemia.  Peripheral Neuropathy Pt currently taking gabapentin 130m TID. Pt would like to discuss increasing the dose today. Pt is having a lot of pain and numbness in legs.    Review of Systems  Constitutional: Negative.  Negative for irritability.  HENT: Positive for hoarse voice. Negative for sore throat.   Eyes: Negative.   Respiratory: Positive for shortness of breath. Negative for cough and wheezing.   Cardiovascular: Negative.  Negative for palpitations.  Gastrointestinal: Positive for constipation. Negative for heartburn and diarrhea.  Endocrine: Negative.   Genitourinary: Negative.   Musculoskeletal: Negative.   Neurological: Negative.  Negative for headaches.  Hematological: Negative.   Psychiatric/Behavioral: Negative for confusion. The patient is nervous/anxious. The patient does not have insomnia.   All other systems reviewed and are negative.      Objective:   Physical Exam  Constitutional: She is oriented to person, place, and time. She appears well-developed and well-nourished. No distress.  HENT:  Head: Normocephalic and atraumatic.  Right Ear: External ear normal.  Mouth/Throat: Oropharynx is clear and moist.  Eyes: Pupils are equal, round, and reactive to  light.  Neck: Normal range of motion. Neck supple. No thyromegaly present.  Cardiovascular: Normal rate, regular rhythm, normal heart sounds and intact distal pulses.   No murmur heard. Pulmonary/Chest: Effort normal and breath sounds normal. No respiratory distress. She has no wheezes.  Abdominal: Soft. Bowel sounds are normal. She exhibits no distension. There is no tenderness.  Musculoskeletal: Normal range of motion. She exhibits no edema or tenderness.  Pt using a cane to walk with  Neurological: She is alert and oriented to person, place, and time. She has normal reflexes. No cranial nerve deficit.  Skin: Skin is warm and dry.  Psychiatric: She has a normal mood and affect. Her behavior is normal. Judgment and thought content normal.  Vitals reviewed.   BP 136/90 mmHg  Pulse 81  Temp(Src) 98.1 F (36.7 C) (Oral)  Ht _0  (1.676 m)  Wt 185 lb (83.915 kg)  BMI 29.87 kg/m2       Assessment & Plan:  1. Coronary artery disease involving native coronary artery of native heart with angina pectoris ([place] - CMP14+EGFR  2. Essential hypertension - CMP14+EGFR  3. Chronic obstructive pulmonary disease, unspecified COPD type ([place] - CMP14+EGFR  4. Asthma, mild intermittent, uncomplicated - CPIR51+OACZ 5. Diabetes mellitus type 2 with neurological manifestations ([place] - CMP14+EGFR - POCT glycosylated hemoglobin (Hb A1C) - Ambulatory referral to Ophthalmology  6. Hypothyroidism, unspecified hypothyroidism type - CMP14+EGFR - Thyroid Panel With TSH  7. Osteopenia - CMP14+EGFR  8. OAB (overactive bladder) - CMP14+EGFR  9. Other polyneuropathy ([place] - CMP14+EGFR  10. Hyperlipidemia - CMP14+EGFR - Lipid panel  11. Depression - CMP14+EGFR  12. Anxiety - CMP14+EGFR  13. Need for hepatitis C screening test - CMP14+EGFR - Hepatitis C antibody  14. Colon cancer screening - CMP14+EGFR - Fecal occult blood, imunochemical   Continue all meds Labs  pending Health Maintenance reviewed Diet and exercise encouraged RTO 3 months  CEvelina Dun FNP

## 2015-01-28 ENCOUNTER — Other Ambulatory Visit: Payer: Self-pay | Admitting: Family

## 2015-01-28 LAB — CMP14+EGFR
ALT: 14 IU/L (ref 0–32)
AST: 12 IU/L (ref 0–40)
Albumin/Globulin Ratio: 1.4 (ref 1.1–2.5)
Albumin: 3.9 g/dL (ref 3.5–4.8)
Alkaline Phosphatase: 73 IU/L (ref 39–117)
BUN/Creatinine Ratio: 26 (ref 11–26)
BUN: 20 mg/dL (ref 8–27)
Bilirubin Total: 0.3 mg/dL (ref 0.0–1.2)
CO2: 29 mmol/L (ref 18–29)
Calcium: 9.5 mg/dL (ref 8.7–10.3)
Chloride: 95 mmol/L — ABNORMAL LOW (ref 97–106)
Creatinine, Ser: 0.78 mg/dL (ref 0.57–1.00)
GFR calc Af Amer: 88 mL/min/{1.73_m2} (ref >59)
GFR calc non Af Amer: 77 mL/min/{1.73_m2} (ref >59)
Globulin, Total: 2.7 g/dL (ref 1.5–4.5)
Glucose: 238 mg/dL — ABNORMAL HIGH (ref 65–99)
Potassium: 4.1 mmol/L (ref 3.5–5.2)
Sodium: 138 mmol/L (ref 136–144)
Total Protein: 6.6 g/dL (ref 6.0–8.5)

## 2015-01-28 LAB — LIPID PANEL
Chol/HDL Ratio: 2.4 ratio units (ref 0.0–4.4)
Cholesterol, Total: 197 mg/dL (ref 100–199)
HDL: 82 mg/dL (ref >39)
LDL Calculated: 107 mg/dL — ABNORMAL HIGH (ref 0–99)
Triglycerides: 42 mg/dL (ref 0–149)
VLDL Cholesterol Cal: 8 mg/dL (ref 5–40)

## 2015-01-28 LAB — HEPATITIS C ANTIBODY: Hep C Virus Ab: <0.1 s/co ratio (ref 0.0–0.9)

## 2015-01-28 LAB — THYROID PANEL WITH TSH
Free Thyroxine Index: 1.4 (ref 1.2–4.9)
T3 Uptake Ratio: 36 % (ref 24–39)
T4, Total: 4 ug/dL — ABNORMAL LOW (ref 4.5–12.0)
TSH: 9.12 u[IU]/mL — ABNORMAL HIGH (ref 0.450–4.500)

## 2015-01-29 ENCOUNTER — Other Ambulatory Visit: Payer: Self-pay | Admitting: *Deleted

## 2015-01-29 ENCOUNTER — Other Ambulatory Visit: Payer: Self-pay | Admitting: Family

## 2015-01-29 DIAGNOSIS — E11311 Type 2 diabetes mellitus with unspecified diabetic retinopathy with macular edema: Secondary | ICD-10-CM

## 2015-01-29 MED ORDER — CANAGLIFLOZIN 100 MG PO TABS: 100.0000 mg | ORAL_TABLET | Freq: Every day | ORAL | Status: DC

## 2015-01-29 MED ORDER — LEVOTHYROXINE SODIUM 150 MCG PO TABS: 150.0000 ug | ORAL_TABLET | Freq: Every day | ORAL | Status: DC

## 2015-01-29 MED ORDER — AMLODIPINE BESYLATE 10 MG PO TABS: 10.0000 mg | ORAL_TABLET | Freq: Every day | ORAL | Status: DC

## 2015-01-29 NOTE — Telephone Encounter (Signed)
rx called into pharmacy

## 2015-01-29 NOTE — Telephone Encounter (Signed)
Last seen 01/27/15  Denise Gardner  If approved route to nurse to call into [facility]  [phone]

## 2015-02-05 ENCOUNTER — Telehealth: Payer: Self-pay

## 2015-02-05 NOTE — Telephone Encounter (Signed)
Insurance prior authorized Nitroglycerin spray through 02/28/16

## 2015-02-24 ENCOUNTER — Ambulatory Visit (INDEPENDENT_AMBULATORY_CARE_PROVIDER_SITE_OTHER): Payer: Commercial Managed Care - HMO

## 2015-02-24 ENCOUNTER — Encounter: Payer: Self-pay | Admitting: Family

## 2015-02-24 ENCOUNTER — Ambulatory Visit (INDEPENDENT_AMBULATORY_CARE_PROVIDER_SITE_OTHER): Payer: Commercial Managed Care - HMO | Admitting: Family

## 2015-02-24 VITALS — BP 138/83 | HR 83 | Temp 97.0°F | Ht 66.0 in | Wt 183.0 lb

## 2015-02-24 DIAGNOSIS — M25472 Effusion, left ankle: Secondary | ICD-10-CM

## 2015-02-24 DIAGNOSIS — M25572 Pain in left ankle and joints of left foot: Secondary | ICD-10-CM | POA: Diagnosis not present

## 2015-02-24 MED ORDER — NAPROXEN 500 MG PO TABS: 500.0000 mg | ORAL_TABLET | Freq: Two times a day (BID) | ORAL | Status: DC

## 2015-02-24 NOTE — Progress Notes (Signed)
   Subjective:    Patient ID: Denise Gardner, female    DOB: [birth year], 69 y.o.   MRN: [id number]  Ankle Pain  The incident occurred more than 1 week ago. There was no injury mechanism. The pain is present in the left ankle. The pain is at a severity of 10/10. The pain is mild. The pain has been fluctuating since onset. Pertinent negatives include no inability to bear weight, loss of motion, numbness or tingling. She reports no foreign bodies present. The symptoms are aggravated by movement and weight bearing. She has tried ice, elevation and rest for the symptoms. The treatment provided mild relief.      Review of Systems  Constitutional: Negative.   HENT: Negative.   Eyes: Negative.   Respiratory: Negative.  Negative for shortness of breath.   Cardiovascular: Negative.  Negative for palpitations.  Gastrointestinal: Negative.   Endocrine: Negative.   Genitourinary: Negative.   Musculoskeletal: Negative.   Neurological: Negative.  Negative for tingling, numbness and headaches.  Hematological: Negative.   Psychiatric/Behavioral: Negative.   All other systems reviewed and are negative.      Objective:   Physical Exam  Constitutional: She is oriented to person, place, and time. She appears well-developed and well-nourished. No distress.  HENT:  Head: Normocephalic and atraumatic.  Right Ear: External ear normal.  Mouth/Throat: Oropharynx is clear and moist.  Eyes: Pupils are equal, round, and reactive to light.  Neck: Normal range of motion. Neck supple. No thyromegaly present.  Cardiovascular: Normal rate, regular rhythm, normal heart sounds and intact distal pulses.   No murmur heard. Pulmonary/Chest: Effort normal and breath sounds normal. No respiratory distress. She has no wheezes.  Abdominal: Soft. Bowel sounds are normal. She exhibits no distension. There is no tenderness.  Musculoskeletal: Normal range of motion. She exhibits edema and tenderness.  2+ edema of left ankle    Neurological: She is alert and oriented to person, place, and time. She has normal reflexes. No cranial nerve deficit.  Skin: Skin is warm and dry.  Psychiatric: She has a normal mood and affect. Her behavior is normal. Judgment and thought content normal.  Vitals reviewed.   BP 138/83 mmHg  Pulse 83  Temp(Src) 97 F (36.1 C) (Oral)  Ht 5' 6" (1.676 m)  Wt 183 lb (83.008 kg)  BMI 29.55 kg/m2  Ankle x-ray- WNL Preliminary reading by Evelina Dun, FNP [facility]      Assessment & Plan:  1. Ankle swelling, left - DG Ankle Complete Left; Future - Arthritis Panel - CMP14+EGFR - naproxen (NAPROSYN) 500 MG tablet; Take 1 tablet (500 mg total) by mouth 2 (two) times daily with a meal.  Dispense: 45 tablet; Refill: 3  2. Pain in joint, ankle and foot, left - DG Ankle Complete Left; Future - Arthritis Panel - CMP14+EGFR - naproxen (NAPROSYN) 500 MG tablet; Take 1 tablet (500 mg total) by mouth 2 (two) times daily with a meal.  Dispense: 45 tablet; Refill: 3  Rest  Ice as needed Keep elevated -Labs pending- Gout? Take naproxen with food and no other NSAID's RTO in 2 weeks   Evelina Dun, FNP

## 2015-02-24 NOTE — Patient Instructions (Signed)

## 2015-02-25 ENCOUNTER — Other Ambulatory Visit: Payer: Self-pay | Admitting: Family

## 2015-02-25 DIAGNOSIS — R768 Other specified abnormal immunological findings in serum: Secondary | ICD-10-CM

## 2015-02-25 LAB — ARTHRITIS PANEL
Basophils Absolute: 0.1 10*3/uL (ref 0.0–0.2)
Basos: 1 %
EOS (ABSOLUTE): 0.3 10*3/uL (ref 0.0–0.4)
Eos: 4 %
Hematocrit: 35.5 % (ref 34.0–46.6)
Hemoglobin: 11.8 g/dL (ref 11.1–15.9)
Immature Grans (Abs): 0 10*3/uL (ref 0.0–0.1)
Immature Granulocytes: 0 %
Lymphocytes Absolute: 2.1 10*3/uL (ref 0.7–3.1)
Lymphs: 28 %
MCH: 28.8 pg (ref 26.6–33.0)
MCHC: 33.2 g/dL (ref 31.5–35.7)
MCV: 87 fL (ref 79–97)
Monocytes Absolute: 0.7 10*3/uL (ref 0.1–0.9)
Monocytes: 9 %
Neutrophils Absolute: 4.3 10*3/uL (ref 1.4–7.0)
Neutrophils: 58 %
Platelets: 232 10*3/uL (ref 150–379)
RBC: 4.1 x10E6/uL (ref 3.77–5.28)
RDW: 14.6 % (ref 12.3–15.4)
Rheumatoid fact SerPl-aCnc: 148 IU/mL — ABNORMAL HIGH (ref 0.0–13.9)
Sed Rate: 7 mm/hr (ref 0–40)
Uric Acid: 5.1 mg/dL (ref 2.5–7.1)
WBC: 7.4 10*3/uL (ref 3.4–10.8)

## 2015-02-25 LAB — CMP14+EGFR
ALT: 34 IU/L — ABNORMAL HIGH (ref 0–32)
AST: 41 IU/L — ABNORMAL HIGH (ref 0–40)
Albumin/Globulin Ratio: 1.5 (ref 1.1–2.5)
Albumin: 3.9 g/dL (ref 3.5–4.8)
Alkaline Phosphatase: 53 IU/L (ref 39–117)
BUN/Creatinine Ratio: 14 (ref 11–26)
BUN: 11 mg/dL (ref 8–27)
Bilirubin Total: 0.3 mg/dL (ref 0.0–1.2)
CO2: 25 mmol/L (ref 18–29)
Calcium: 9.8 mg/dL (ref 8.7–10.3)
Chloride: 100 mmol/L (ref 96–106)
Creatinine, Ser: 0.8 mg/dL (ref 0.57–1.00)
GFR calc Af Amer: 86 mL/min/{1.73_m2} (ref >59)
GFR calc non Af Amer: 74 mL/min/{1.73_m2} (ref >59)
Globulin, Total: 2.6 g/dL (ref 1.5–4.5)
Glucose: 162 mg/dL — ABNORMAL HIGH (ref 65–99)
Potassium: 4 mmol/L (ref 3.5–5.2)
Sodium: 141 mmol/L (ref 134–144)
Total Protein: 6.5 g/dL (ref 6.0–8.5)

## 2015-02-26 ENCOUNTER — Ambulatory Visit: Payer: Self-pay | Admitting: Family

## 2015-03-10 ENCOUNTER — Ambulatory Visit: Payer: Commercial Managed Care - HMO | Admitting: Family

## 2015-03-12 ENCOUNTER — Encounter: Payer: Self-pay | Admitting: Family

## 2015-03-12 ENCOUNTER — Ambulatory Visit (INDEPENDENT_AMBULATORY_CARE_PROVIDER_SITE_OTHER): Payer: Medicare Other | Admitting: Family

## 2015-03-12 ENCOUNTER — Ambulatory Visit: Payer: Commercial Managed Care - HMO | Admitting: Family

## 2015-03-12 VITALS — BP 135/70 | HR 84 | Temp 96.2°F | Ht 66.0 in | Wt 190.0 lb

## 2015-03-12 DIAGNOSIS — M0579 Rheumatoid arthritis with rheumatoid factor of multiple sites without organ or systems involvement: Secondary | ICD-10-CM | POA: Diagnosis not present

## 2015-03-12 DIAGNOSIS — J069 Acute upper respiratory infection, unspecified: Secondary | ICD-10-CM

## 2015-03-12 DIAGNOSIS — K2100 Gastro-esophageal reflux disease with esophagitis, without bleeding: Secondary | ICD-10-CM

## 2015-03-12 DIAGNOSIS — J309 Allergic rhinitis, unspecified: Secondary | ICD-10-CM | POA: Diagnosis not present

## 2015-03-12 MED ORDER — METFORMIN HCL 1000 MG PO TABS: ORAL_TABLET | ORAL | Status: DC

## 2015-03-12 MED ORDER — PREDNISONE 10 MG (21) PO TBPK: 10.0000 mg | ORAL_TABLET | Freq: Every day | ORAL | Status: DC

## 2015-03-12 MED ORDER — FLUTICASONE PROPIONATE 50 MCG/ACT NA SUSP: 2.0000 | Freq: Every day | NASAL | Status: DC

## 2015-03-12 NOTE — Progress Notes (Signed)
Subjective:    Patient ID: Denise Gardner, female    DOB: 02-18-1946, 70 y.o.   MRN: [id number]  Pt presents to the office today to recheck left ankle pain and swelling. Pt had blood work drawn and pt had a positive Rheumatoid factor. Pt is waiting for referral. PT states the pain and swelling is improved. Cough This is a new problem. The current episode started in the past 7 days. The problem has been waxing and waning. The problem occurs every few minutes. The cough is non-productive. Associated symptoms include chills, ear congestion, ear pain, headaches, myalgias, nasal congestion, postnasal drip, rhinorrhea, a sore throat and wheezing. Pertinent negatives include no fever or shortness of breath. The symptoms are aggravated by lying down. She has tried rest and OTC cough suppressant for the symptoms. The treatment provided mild relief. Her past medical history is significant for COPD.   Marland Kitchen    Review of Systems  Constitutional: Positive for chills. Negative for fever.  HENT: Positive for ear pain, postnasal drip, rhinorrhea and sore throat.   Eyes: Negative.   Respiratory: Positive for cough and wheezing. Negative for shortness of breath.   Cardiovascular: Negative.  Negative for palpitations.  Gastrointestinal: Negative.   Endocrine: Negative.   Genitourinary: Negative.   Musculoskeletal: Positive for myalgias.  Neurological: Positive for headaches.  Hematological: Negative.   Psychiatric/Behavioral: Negative.   All other systems reviewed and are negative.      Objective:   Physical Exam  Constitutional: She is oriented to person, place, and time. She appears well-developed and well-nourished. No distress.  HENT:  Head: Normocephalic and atraumatic.  Right Ear: External ear normal.  Left Ear: External ear normal.  Nasal passage erythemas with mild swelling    Eyes: Pupils are equal, round, and reactive to light.  Neck: Normal range of motion. Neck supple. No thyromegaly  present.  Cardiovascular: Normal rate, regular rhythm, normal heart sounds and intact distal pulses.   No murmur heard. Pulmonary/Chest: Effort normal. No respiratory distress. She has wheezes.  Abdominal: Soft. Bowel sounds are normal. She exhibits no distension. There is no tenderness.  Musculoskeletal: Normal range of motion. She exhibits no edema or tenderness.  Neurological: She is alert and oriented to person, place, and time. She has normal reflexes. No cranial nerve deficit.  Skin: Skin is warm and dry.  Psychiatric: She has a normal mood and affect. Her behavior is normal. Judgment and thought content normal.  Vitals reviewed.     BP 135/70 mmHg  Pulse 84  Temp(Src) 96.2 F (35.7 C) (Oral)  Ht 5\' 6"  (1.676 m)  Wt 190 lb (86.183 kg)  BMI 30.68 kg/m2     Assessment & Plan:  1. Gastroesophageal reflux disease with esophagitis  2. Allergic rhinitis, unspecified allergic rhinitis type - fluticasone (FLONASE) 50 MCG/ACT nasal spray; Place 2 sprays into both nostrils daily.  Dispense: 16 g; Refill: 6  3. Acute upper respiratory infection -- Take meds as prescribed - Use a cool mist humidifier  -Use saline nose sprays frequently -Saline irrigations of the nose can be very helpful if done frequently.  * 4X daily for 1 week*  * Use of a nettie pot can be helpful with this. Follow directions with this* -Force fluids -For any cough or congestion  Use plain Mucinex- regular strength or max strength is fine   * Children- consult with Pharmacist for dosing -For fever or aces or pains- take tylenol or ibuprofen appropriate for age and weight.  *  for fevers greater than 101 orally you may alternate ibuprofen and tylenol every  3 hours. -Throat lozenges if help - fluticasone (FLONASE) 50 MCG/ACT nasal spray; Place 2 sprays into both nostrils daily.  Dispense: 16 g; Refill: 6 - predniSONE (STERAPRED UNI-PAK 21 TAB) 10 MG (21) TBPK tablet; Take 1 tablet (10 mg total) by mouth daily.  As directed x 6 days  Dispense: 21 tablet; Refill: 0  4. Rheumatoid arthritis involving multiple sites with positive rheumatoid factor (HCC) -Keep Rheumatologists appt - predniSONE (STERAPRED UNI-PAK 21 TAB) 10 MG (21) TBPK tablet; Take 1 tablet (10 mg total) by mouth daily. As directed x 6 days  Dispense: 21 tablet; Refill: 0  Evelina Dun, FNP

## 2015-03-12 NOTE — Patient Instructions (Signed)
Upper Respiratory Infection, Adult Most upper respiratory infections (URIs) are a viral infection of the air passages leading to the lungs. A URI affects the nose, throat, and upper air passages. The most common type of URI is nasopharyngitis and is typically referred to as "the common cold." URIs run their course and usually go away on their own. Most of the time, a URI does not require medical attention, but sometimes a bacterial infection in the upper airways can follow a viral infection. This is called a secondary infection. Sinus and middle ear infections are common types of secondary upper respiratory infections. Bacterial pneumonia can also complicate a URI. A URI can worsen asthma and chronic obstructive pulmonary disease (COPD). Sometimes, these complications can require emergency medical care and may be life threatening.  CAUSES Almost all URIs are caused by viruses. A virus is a type of germ and can spread from one person to another.  RISKS FACTORS You may be at risk for a URI if:   You smoke.   You have chronic heart or lung disease.  You have a weakened defense (immune) system.   You are very young or very old.   You have nasal allergies or asthma.  You work in crowded or poorly ventilated areas.  You work in health care facilities or schools. SIGNS AND SYMPTOMS  Symptoms typically develop 2-3 days after you come in contact with a cold virus. Most viral URIs last 7-10 days. However, viral URIs from the influenza virus (flu virus) can last 14-18 days and are typically more severe. Symptoms may include:   Runny or stuffy (congested) nose.   Sneezing.   Cough.   Sore throat.   Headache.   Fatigue.   Fever.   Loss of appetite.   Pain in your forehead, behind your eyes, and over your cheekbones (sinus pain).  Muscle aches.  DIAGNOSIS  Your health care provider may diagnose a URI by:  Physical exam.  Tests to check that your symptoms are not due to  another condition such as:  Strep throat.  Sinusitis.  Pneumonia.  Asthma. TREATMENT  A URI goes away on its own with time. It cannot be cured with medicines, but medicines may be prescribed or recommended to relieve symptoms. Medicines may help:  Reduce your fever.  Reduce your cough.  Relieve nasal congestion. HOME CARE INSTRUCTIONS   Take medicines only as directed by your health care provider.   Gargle warm saltwater or take cough drops to comfort your throat as directed by your health care provider.  Use a warm mist humidifier or inhale steam from a shower to increase air moisture. This may make it easier to breathe.  Drink enough fluid to keep your urine clear or pale yellow.   Eat soups and other clear broths and maintain good nutrition.   Rest as needed.   Return to work when your temperature has returned to normal or as your health care provider advises. You may need to stay home longer to avoid infecting others. You can also use a face mask and careful hand washing to prevent spread of the virus.  Increase the usage of your inhaler if you have asthma.   Do not use any tobacco products, including cigarettes, chewing tobacco, or electronic cigarettes. If you need help quitting, ask your health care provider. PREVENTION  The best way to protect yourself from getting a cold is to practice good hygiene.   Avoid oral or hand contact with people with cold   symptoms.   Wash your hands often if contact occurs.  There is no clear evidence that vitamin C, vitamin E, echinacea, or exercise reduces the chance of developing a cold. However, it is always recommended to get plenty of rest, exercise, and practice good nutrition.  SEEK MEDICAL CARE IF:   You are getting worse rather than better.   Your symptoms are not controlled by medicine.   You have chills.  You have worsening shortness of breath.  You have [place] or red mucus.  You have yellow or Shumard nasal  discharge.  You have pain in your face, especially when you bend forward.  You have a fever.  You have swollen neck glands.  You have pain while swallowing.  You have white areas in the back of your throat. SEEK IMMEDIATE MEDICAL CARE IF:   You have severe or persistent:  Headache.  Ear pain.  Sinus pain.  Chest pain.  You have chronic lung disease and any of the following:  Wheezing.  Prolonged cough.  Coughing up blood.  A change in your usual mucus.  You have a stiff neck.  You have changes in your:  Vision.  Hearing.  Thinking.  Mood. MAKE SURE YOU:   Understand these instructions.  Will watch your condition.  Will get help right away if you are not doing well or get worse.   This information is not intended to replace advice given to you by your health care provider. Make sure you discuss any questions you have with your health care provider.   Document Released: 08/10/2000 Document Revised: 07/01/2014 Document Reviewed: 05/22/2013 Elsevier Interactive Patient Education 2016 [employer] Rheumatoid Arthritis Rheumatoid arthritis is a long-term (chronic) inflammatory disease that causes pain, swelling, and stiffness of the joints. It can affect the entire body, including the eyes and lungs. The effects of rheumatoid arthritis vary widely among those with the condition. CAUSES The cause of rheumatoid arthritis is not known. It tends to run in families and is more common in women. Certain cells of the body's natural defense system (immune system) do not work properly and begin to attack healthy joints. It primarily involves the connective tissue that lines the joints (synovial membrane). This can cause damage to the joint. SYMPTOMS  Pain, stiffness, swelling, and decreased motion of many joints, especially in the hands and feet.  Stiffness that is worse in the morning. It may last 1-2 hours or longer.  Numbness and tingling in the  hands.  Fatigue.  Loss of appetite.  Weight loss.  Low-grade fever.  Dry eyes and mouth.  Firm lumps (rheumatoid nodules) that grow beneath the skin in areas such as the elbows and hands. DIAGNOSIS Diagnosis is based on the symptoms described, an exam, and blood tests. Sometimes, X-rays are helpful. TREATMENT The goals of treatment are to relieve pain, reduce inflammation, and to slow down or stop joint damage and disability. Methods vary and may include:  Maintaining a balance of rest, exercise, and proper nutrition.  Your health care provider may adjust your medicines every 3 months until treatment goals are reached. Common medicines include:  Pain relievers (analgesics).  Corticosteroids and nonsteroidal anti-inflammatory drugs (NSAIDs) to reduce inflammation.  Disease-modifying antirheumatic drugs (DMARDs) to try to slow the course of the disease.  Biologic response modifiers to reduce inflammation and damage.  Physical therapy and occupational therapy.  Surgery for patients with severe joint damage. Joint replacement or fusing of joints may be needed.  Routine monitoring and ongoing care,  such as office visits, blood and urine tests, and X-rays. Your health care provider will work with you to identify the best treatment option for you, based on an assessment of the overall disease activity in your body. HOME CARE INSTRUCTIONS  Remain physically active and reduce activity when the disease gets worse.  Eat a well-balanced diet.  Put heat on affected joints when you wake up and before activities. Keep the heat on the affected joint for as long as directed by your health care provider.  Put ice on affected joints following activities or exercising.  Put ice in a plastic bag.  Place a towel between your skin and the bag.  Leave the ice on for 15-20 minutes, 3-4 times per day, or as directed by your health care provider.  Take medicines and supplements only as  directed by your health care provider.  Use splints as directed by your health care provider. Splints help maintain joint position and function.  Do not sleep with pillows under your knees. This may lead to spasms.  Participate in a self-management program to keep current with the latest treatment and coping skills. SEEK IMMEDIATE MEDICAL CARE IF:  You have fainting episodes.  You have periods of extreme weakness.  You rapidly develop a hot, painful joint that is more severe than usual joint aches.  You have chills.  You have a fever. FOR MORE INFORMATION  [employer]: www.rheumatology.[place]: www.arthritis.org   This information is not intended to replace advice given to you by your health care provider. Make sure you discuss any questions you have with your health care provider.   Document Released: 02/12/2000 Document Revised: 03/07/2014 Document Reviewed: 03/23/2011 Elsevier Interactive Patient Education 2016 [place] meds as prescribed - Use a cool mist humidifier  -Use saline nose sprays frequently -Saline irrigations of the nose can be very helpful if done frequently.  * 4X daily for 1 week*  * Use of a nettie pot can be helpful with this. Follow directions with this* -Force fluids -For any cough or congestion  Use plain Mucinex- regular strength or max strength is fine   * Children- consult with Pharmacist for dosing -For fever or aces or pains- take tylenol or ibuprofen appropriate for age and weight.  * for fevers greater than 101 orally you may alternate ibuprofen and tylenol every  3 hours. -Throat lozenges if help   Evelina Dun, FNP

## 2015-04-02 ENCOUNTER — Other Ambulatory Visit: Payer: Self-pay | Admitting: Family

## 2015-04-29 ENCOUNTER — Other Ambulatory Visit: Payer: Self-pay | Admitting: Family

## 2015-04-29 NOTE — Telephone Encounter (Signed)
Last seen 03/12/15  Denise Gardner  If approved route to nurse to call into [phone]

## 2015-04-30 ENCOUNTER — Encounter: Payer: Self-pay | Admitting: Family

## 2015-04-30 ENCOUNTER — Ambulatory Visit (INDEPENDENT_AMBULATORY_CARE_PROVIDER_SITE_OTHER): Payer: Medicare Other | Admitting: Family

## 2015-04-30 VITALS — BP 131/70 | HR 68 | Temp 97.0°F | Ht 66.0 in | Wt 188.4 lb

## 2015-04-30 DIAGNOSIS — N3281 Overactive bladder: Secondary | ICD-10-CM | POA: Diagnosis not present

## 2015-04-30 DIAGNOSIS — F329 Major depressive disorder, single episode, unspecified: Secondary | ICD-10-CM | POA: Diagnosis not present

## 2015-04-30 DIAGNOSIS — F419 Anxiety disorder, unspecified: Secondary | ICD-10-CM | POA: Diagnosis not present

## 2015-04-30 DIAGNOSIS — E039 Hypothyroidism, unspecified: Secondary | ICD-10-CM

## 2015-04-30 DIAGNOSIS — E785 Hyperlipidemia, unspecified: Secondary | ICD-10-CM | POA: Diagnosis not present

## 2015-04-30 DIAGNOSIS — J449 Chronic obstructive pulmonary disease, unspecified: Secondary | ICD-10-CM

## 2015-04-30 DIAGNOSIS — R768 Other specified abnormal immunological findings in serum: Secondary | ICD-10-CM

## 2015-04-30 DIAGNOSIS — M8580 Other specified disorders of bone density and structure, unspecified site: Secondary | ICD-10-CM

## 2015-04-30 DIAGNOSIS — K219 Gastro-esophageal reflux disease without esophagitis: Secondary | ICD-10-CM

## 2015-04-30 DIAGNOSIS — R300 Dysuria: Secondary | ICD-10-CM | POA: Diagnosis not present

## 2015-04-30 DIAGNOSIS — J4520 Mild intermittent asthma, uncomplicated: Secondary | ICD-10-CM

## 2015-04-30 DIAGNOSIS — I10 Essential (primary) hypertension: Secondary | ICD-10-CM

## 2015-04-30 DIAGNOSIS — G6289 Other specified polyneuropathies: Secondary | ICD-10-CM

## 2015-04-30 DIAGNOSIS — Z1211 Encounter for screening for malignant neoplasm of colon: Secondary | ICD-10-CM

## 2015-04-30 DIAGNOSIS — E1149 Type 2 diabetes mellitus with other diabetic neurological complication: Secondary | ICD-10-CM | POA: Diagnosis not present

## 2015-04-30 DIAGNOSIS — E876 Hypokalemia: Secondary | ICD-10-CM | POA: Insufficient documentation

## 2015-04-30 DIAGNOSIS — F32A Depression, unspecified: Secondary | ICD-10-CM

## 2015-04-30 LAB — POCT URINALYSIS DIPSTICK
Bilirubin, UA: NEGATIVE
Blood, UA: NEGATIVE
Glucose, UA: NEGATIVE
Nitrite, UA: NEGATIVE
Protein, UA: NEGATIVE
Spec Grav, UA: 1.02
Urobilinogen, UA: NEGATIVE
pH, UA: 6

## 2015-04-30 LAB — POCT UA - MICROSCOPIC ONLY
Casts, Ur, LPF, POC: NEGATIVE
Crystals, Ur, HPF, POC: NEGATIVE
Mucus, UA: NEGATIVE
Yeast, UA: NEGATIVE

## 2015-04-30 LAB — POCT GLYCOSYLATED HEMOGLOBIN (HGB A1C): Hemoglobin A1C: 7.2

## 2015-04-30 MED ORDER — NIACIN ER (ANTIHYPERLIPIDEMIC) 1000 MG PO TBCR: 2000.0000 mg | EXTENDED_RELEASE_TABLET | Freq: Every day | ORAL | Status: DC

## 2015-04-30 NOTE — Telephone Encounter (Signed)
Refill called to pharmacy.

## 2015-04-30 NOTE — Patient Instructions (Signed)
Health Maintenance, Female Adopting a healthy lifestyle and getting preventive care can go a long way to promote health and wellness. Talk with your health care provider about what schedule of regular examinations is right for you. This is a good chance for you to check in with your provider about disease prevention and staying healthy. In between checkups, there are plenty of things you can do on your own. Experts have done a lot of research about which lifestyle changes and preventive measures are most likely to keep you healthy. Ask your health care provider for more information. WEIGHT AND DIET  Eat a healthy diet  Be sure to include plenty of vegetables, fruits, low-fat dairy products, and lean protein.  Do not eat a lot of foods high in solid fats, added sugars, or salt.  Get regular exercise. This is one of the most important things you can do for your health.  Most adults should exercise for at least 150 minutes each week. The exercise should increase your heart rate and make you sweat (moderate-intensity exercise).  Most adults should also do strengthening exercises at least twice a week. This is in addition to the moderate-intensity exercise.  Maintain a healthy weight  Body mass index (BMI) is a measurement that can be used to identify possible weight problems. It estimates body fat based on height and weight. Your health care provider can help determine your BMI and help you achieve or maintain a healthy weight.  For females 20 years of age and older:   A BMI below 18.5 is considered underweight.  A BMI of 18.5 to 24.9 is normal.  A BMI of 25 to 29.9 is considered overweight.  A BMI of 30 and above is considered obese.  Watch levels of cholesterol and blood lipids  You should start having your blood tested for lipids and cholesterol at 72 years of age, then have this test every 5 years.  You may need to have your cholesterol levels checked more often if:  Your lipid  or cholesterol levels are high.  You are older than 72 years of age.  You are at high risk for heart disease.  CANCER SCREENING   Lung Cancer  Lung cancer screening is recommended for adults 55-80 years old who are at high risk for lung cancer because of a history of smoking.  A yearly low-dose CT scan of the lungs is recommended for people who:  Currently smoke.  Have quit within the past 15 years.  Have at least a 30-pack-year history of smoking. A pack year is smoking an average of one pack of cigarettes a day for 1 year.  Yearly screening should continue until it has been 15 years since you quit.  Yearly screening should stop if you develop a health problem that would prevent you from having lung cancer treatment.  Breast Cancer  Practice breast self-awareness. This means understanding how your breasts normally appear and feel.  It also means doing regular breast self-exams. Let your health care provider know about any changes, no matter how small.  If you are in your 20s or 30s, you should have a clinical breast exam (CBE) by a health care provider every 1-3 years as part of a regular health exam.  If you are 40 or older, have a CBE every year. Also consider having a breast X-ray (mammogram) every year.  If you have a family history of breast cancer, talk to your health care provider about genetic screening.  If you   are at high risk for breast cancer, talk to your health care provider about having an MRI and a mammogram every year.  Breast cancer gene (BRCA) assessment is recommended for women who have family members with BRCA-related cancers. BRCA-related cancers include:  Breast.  Ovarian.  Tubal.  Peritoneal cancers.  Results of the assessment will determine the need for genetic counseling and BRCA1 and BRCA2 testing. Cervical Cancer Your health care provider may recommend that you be screened regularly for cancer of the pelvic organs (ovaries, uterus, and  vagina). This screening involves a pelvic examination, including checking for microscopic changes to the surface of your cervix (Pap test). You may be encouraged to have this screening done every 3 years, beginning at age 21.  For women ages 30-65, health care providers may recommend pelvic exams and Pap testing every 3 years, or they may recommend the Pap and pelvic exam, combined with testing for human papilloma virus (HPV), every 5 years. Some types of HPV increase your risk of cervical cancer. Testing for HPV may also be done on women of any age with unclear Pap test results.  Other health care providers may not recommend any screening for nonpregnant women who are considered low risk for pelvic cancer and who do not have symptoms. Ask your health care provider if a screening pelvic exam is right for you.  If you have had past treatment for cervical cancer or a condition that could lead to cancer, you need Pap tests and screening for cancer for at least 20 years after your treatment. If Pap tests have been discontinued, your risk factors (such as having a new sexual partner) need to be reassessed to determine if screening should resume. Some women have medical problems that increase the chance of getting cervical cancer. In these cases, your health care provider may recommend more frequent screening and Pap tests. Colorectal Cancer  This type of cancer can be detected and often prevented.  Routine colorectal cancer screening usually begins at 72 years of age and continues through 72 years of age.  Your health care provider may recommend screening at an earlier age if you have risk factors for colon cancer.  Your health care provider may also recommend using home test kits to check for hidden blood in the stool.  A small camera at the end of a tube can be used to examine your colon directly (sigmoidoscopy or colonoscopy). This is done to check for the earliest forms of colorectal  cancer.  Routine screening usually begins at age 50.  Direct examination of the colon should be repeated every 5-10 years through 72 years of age. However, you may need to be screened more often if early forms of precancerous polyps or small growths are found. Skin Cancer  Check your skin from head to toe regularly.  Tell your health care provider about any new moles or changes in moles, especially if there is a change in a mole's shape or color.  Also tell your health care provider if you have a mole that is larger than the size of a pencil eraser.  Always use sunscreen. Apply sunscreen liberally and repeatedly throughout the day.  Protect yourself by wearing long sleeves, pants, a wide-brimmed hat, and sunglasses whenever you are outside. HEART DISEASE, DIABETES, AND HIGH BLOOD PRESSURE   High blood pressure causes heart disease and increases the risk of stroke. High blood pressure is more likely to develop in:  People who have blood pressure in the high end   of the normal range (130-139/85-89 mm Hg).  People who are overweight or obese.  People who are African American.  If you are 38-23 years of age, have your blood pressure checked every 3-5 years. If you are 61 years of age or older, have your blood pressure checked every year. You should have your blood pressure measured twice--once when you are at a hospital or clinic, and once when you are not at a hospital or clinic. Record the average of the two measurements. To check your blood pressure when you are not at a hospital or clinic, you can use:  An automated blood pressure machine at a pharmacy.  A home blood pressure monitor.  If you are between 45 years and 39 years old, ask your health care provider if you should take aspirin to prevent strokes.  Have regular diabetes screenings. This involves taking a blood sample to check your fasting blood sugar level.  If you are at a normal weight and have a low risk for diabetes,  have this test once every three years after 72 years of age.  If you are overweight and have a high risk for diabetes, consider being tested at a younger age or more often. PREVENTING INFECTION  Hepatitis B  If you have a higher risk for hepatitis B, you should be screened for this virus. You are considered at high risk for hepatitis B if:  You were born in a country where hepatitis B is common. Ask your health care provider which countries are considered high risk.  Your parents were born in a high-risk country, and you have not been immunized against hepatitis B (hepatitis B vaccine).  You have HIV or AIDS.  You use needles to inject street drugs.  You live with someone who has hepatitis B.  You have had sex with someone who has hepatitis B.  You get hemodialysis treatment.  You take certain medicines for conditions, including cancer, organ transplantation, and autoimmune conditions. Hepatitis C  Blood testing is recommended for:  Everyone born from [birth year] through 1967.  Anyone with known risk factors for hepatitis C. Sexually transmitted infections (STIs)  You should be screened for sexually transmitted infections (STIs) including gonorrhea and chlamydia if:  You are sexually active and are younger than 72 years of age.  You are older than 72 years of age and your health care provider tells you that you are at risk for this type of infection.  Your sexual activity has changed since you were last screened and you are at an increased risk for chlamydia or gonorrhea. Ask your health care provider if you are at risk.  If you do not have HIV, but are at risk, it may be recommended that you take a prescription medicine daily to prevent HIV infection. This is called pre-exposure prophylaxis (PrEP). You are considered at risk if:  You are sexually active and do not regularly use condoms or know the HIV status of your partner(s).  You take drugs by injection.  You are sexually  active with a partner who has HIV. Talk with your health care provider about whether you are at high risk of being infected with HIV. If you choose to begin PrEP, you should first be tested for HIV. You should then be tested every 3 months for as long as you are taking PrEP.  PREGNANCY   If you are premenopausal and you may become pregnant, ask your health care provider about preconception counseling.  If you may  become pregnant, take 400 to 800 micrograms (mcg) of folic acid every day.  If you want to prevent pregnancy, talk to your health care provider about birth control (contraception). OSTEOPOROSIS AND MENOPAUSE   Osteoporosis is a disease in which the bones lose minerals and strength with aging. This can result in serious bone fractures. Your risk for osteoporosis can be identified using a bone density scan.  If you are 61 years of age or older, or if you are at risk for osteoporosis and fractures, ask your health care provider if you should be screened.  Ask your health care provider whether you should take a calcium or vitamin D supplement to lower your risk for osteoporosis.  Menopause may have certain physical symptoms and risks.  Hormone replacement therapy may reduce some of these symptoms and risks. Talk to your health care provider about whether hormone replacement therapy is right for you.  HOME CARE INSTRUCTIONS   Schedule regular health, dental, and eye exams.  Stay current with your immunizations.   Do not use any tobacco products including cigarettes, chewing tobacco, or electronic cigarettes.  If you are pregnant, do not drink alcohol.  If you are breastfeeding, limit how much and how often you drink alcohol.  Limit alcohol intake to no more than 1 drink per day for nonpregnant women. One drink equals 12 ounces of beer, 5 ounces of wine, or 1 ounces of hard liquor.  Do not use street drugs.  Do not share needles.  Ask your health care provider for help if  you need support or information about quitting drugs.  Tell your health care provider if you often feel depressed.  Tell your health care provider if you have ever been abused or do not feel safe at home.   This information is not intended to replace advice given to you by your health care provider. Make sure you discuss any questions you have with your health care provider.   Document Released: 08/30/2010 Document Revised: 03/07/2014 Document Reviewed: 01/16/2013 Elsevier Interactive Patient Education [employer].

## 2015-04-30 NOTE — Progress Notes (Signed)
Subjective:    Patient ID: Denise Gardner, female    DOB: August 08, 1945, 70 y.o.   MRN: [id number]  PT presents to the office today for chronic follow up.  Diabetes She presents for her follow-up diabetic visit. She has type 2 diabetes mellitus. Her disease course has been worsening. Hypoglycemia symptoms include nervousness/anxiousness. Pertinent negatives for hypoglycemia include no confusion, headaches, mood changes or sleepiness. Associated symptoms include foot paresthesias and visual change. Pertinent negatives for diabetes include no foot ulcerations. Pertinent negatives for hypoglycemia complications include no blackouts and no hospitalization. Symptoms are worsening. Diabetic complications include heart disease and peripheral neuropathy. Pertinent negatives for diabetic complications include no CVA or nephropathy. Risk factors for coronary artery disease include diabetes mellitus, dyslipidemia, family history, hypertension, obesity, sedentary lifestyle and post-menopausal. Current diabetic treatment includes oral agent (triple therapy). She is compliant with treatment all of the time. She is following a generally unhealthy diet. Her breakfast blood glucose range is generally >200 mg/dl. An ACE inhibitor/angiotensin II receptor blocker is not being taken. Eye exam is not current.  Hypertension This is a chronic problem. The current episode started more than 1 year ago. The problem has been resolved since onset. The problem is controlled. Associated symptoms include anxiety, peripheral edema ("at times") and shortness of breath. Pertinent negatives include no headaches or palpitations. Risk factors for coronary artery disease include diabetes mellitus, dyslipidemia, family history, obesity, post-menopausal state and sedentary lifestyle. Past treatments include beta blockers. Hypertensive end-organ damage includes heart failure. There is no history of kidney disease, CAD/MI, CVA or a thyroid problem.  There is no history of sleep apnea.  Hyperlipidemia This is a chronic problem. The current episode started more than 1 year ago. The problem is controlled. Recent lipid tests were reviewed and are normal. Exacerbating diseases include diabetes and hypothyroidism. Associated symptoms include shortness of breath. Current antihyperlipidemic treatment includes herbal therapy and nicotinic acid. The current treatment provides moderate improvement of lipids. Risk factors for coronary artery disease include diabetes mellitus, dyslipidemia, family history, hypertension, obesity, a sedentary lifestyle and post-menopausal.  Anxiety Presents for follow-up visit. Onset was 1 to 6 months ago. The problem has been waxing and waning. Symptoms include excessive worry, nervous/anxious behavior and shortness of breath. Patient reports no confusion, insomnia, irritability, nausea or palpitations. Symptoms occur occasionally.   Her past medical history is significant for anxiety/panic attacks, asthma and depression. Past treatments include nothing. The treatment provided mild relief. Compliance with prior treatments has been good.  Gastroesophageal Reflux She complains of a hoarse voice. She reports no belching, no coughing, no heartburn, no nausea, no sore throat or no wheezing. This is a chronic problem. The current episode started more than 1 year ago. The problem occurs rarely. The problem has been resolved. The symptoms are aggravated by certain foods. She has tried a PPI and a diet change for the symptoms. The treatment provided no relief.  Asthma She complains of frequent throat clearing, hoarse voice and shortness of breath. There is no cough or wheezing. This is a chronic problem. The current episode started more than 1 year ago. The problem occurs intermittently. The problem has been waxing and waning. Pertinent negatives include no headaches, heartburn or sore throat. Her symptoms are aggravated by change in  weather. Her symptoms are alleviated by rest and steroid inhaler. She reports moderate improvement on treatment. Her past medical history is significant for asthma and COPD.  Thyroid Problem Presents for follow-up visit. Symptoms include anxiety, constipation, dry skin,  hoarse voice and visual change. Patient reports no diarrhea or palpitations. The symptoms have been stable. Past treatments include levothyroxine. The treatment provided significant relief. Her past medical history is significant for diabetes, heart failure and hyperlipidemia.  Dysuria  This is a new problem. The current episode started more than 1 month ago. The problem has been waxing and waning. The quality of the pain is described as aching. The pain is at a severity of 7/10. The pain is mild. Associated symptoms include frequency, hesitancy and urgency. Pertinent negatives include no discharge, hematuria, nausea or vomiting. She has tried increased fluids for the symptoms. The treatment provided no relief.  Peripheral Neuropathy Pt currently taking gabapentin 374m TID. Pt states this seems to be helping with the "burning in her feet".    Review of Systems  Constitutional: Negative.  Negative for irritability.  HENT: Positive for hoarse voice. Negative for sore throat.   Eyes: Negative.   Respiratory: Positive for shortness of breath. Negative for cough and wheezing.   Cardiovascular: Negative.  Negative for palpitations.  Gastrointestinal: Positive for constipation. Negative for heartburn, nausea, vomiting and diarrhea.  Endocrine: Negative.   Genitourinary: Positive for dysuria, hesitancy, urgency and frequency. Negative for hematuria.  Musculoskeletal: Negative.   Neurological: Negative.  Negative for headaches.  Hematological: Negative.   Psychiatric/Behavioral: Negative for confusion. The patient is nervous/anxious. The patient does not have insomnia.   All other systems reviewed and are negative.      Objective:     Physical Exam  Constitutional: She is oriented to person, place, and time. She appears well-developed and well-nourished. No distress.  HENT:  Head: Normocephalic and atraumatic.  Right Ear: External ear normal.  Mouth/Throat: Oropharynx is clear and moist.  Eyes: Pupils are equal, round, and reactive to light.  Neck: Normal range of motion. Neck supple. No thyromegaly present.  Cardiovascular: Normal rate, regular rhythm, normal heart sounds and intact distal pulses.   No murmur heard. Pulmonary/Chest: Effort normal and breath sounds normal. No respiratory distress. She has no wheezes.  Abdominal: Soft. Bowel sounds are normal. She exhibits no distension. There is no tenderness.  Musculoskeletal: Normal range of motion. She exhibits edema (trace in BLE). She exhibits no tenderness.  Pt using a cane to walk with  Neurological: She is alert and oriented to person, place, and time. She has normal reflexes. No cranial nerve deficit.  Skin: Skin is warm and dry.  Psychiatric: She has a normal mood and affect. Her behavior is normal. Judgment and thought content normal.  Vitals reviewed.   BP 131/70 mmHg  Pulse 68  Temp(Src) 97 F (36.1 C) (Oral)  Ht '5\' 6"'  (1.676 m)  Wt 188 lb 6.4 oz (85.458 kg)  BMI 30.42 kg/m2       Assessment & Plan:  1. Essential hypertension - CMP14+EGFR  2. Asthma, mild intermittent, uncomplicated - CNID78+EUMP 3. Chronic obstructive pulmonary disease, unspecified COPD type [place] - CMP14+EGFR  4. Diabetes mellitus type 2 with neurological manifestations ([place] - POCT glycosylated hemoglobin (Hb A1C) - CMP14+EGFR - Microalbumin / creatinine urine ratio - Ambulatory referral to Ophthalmology  5. Gastroesophageal reflux disease, esophagitis presence not specified - CMP14+EGFR  6. Hypothyroidism, unspecified hypothyroidism type - CMP14+EGFR - Thyroid Panel With TSH  7. Osteopenia - CMP14+EGFR  8. Other polyneuropathy ([place] - CMP14+EGFR  9.  Depression  - CMP14+EGFR  10. Hyperlipidemia - niacin (NIASPAN) 1000 MG CR tablet; Take 2 tablets (2,000 mg total) by mouth at bedtime.  Dispense: 180  tablet; Refill: 3 - CMP14+EGFR - Lipid panel  11. Anxiety  - CMP14+EGFR  12. OAB (overactive bladder)  - CMP14+EGFR  13. Colon cancer screening  - Fecal occult blood, imunochemical; Future  14. Dysuria - POCT UA - Microscopic Only - POCT urinalysis dipstick  15. Hypokalemia   16. Elevated rheumatoid factor  - Ambulatory referral to Rheumatology   Continue all meds Labs pending Health Maintenance reviewed Diet and exercise encouraged RTO 3 months  Evelina Dun, FNP

## 2015-05-01 ENCOUNTER — Other Ambulatory Visit: Payer: Self-pay | Admitting: Family

## 2015-05-01 ENCOUNTER — Telehealth: Payer: Self-pay | Admitting: *Deleted

## 2015-05-01 LAB — CMP14+EGFR
ALT: 24 IU/L (ref 0–32)
AST: 26 IU/L (ref 0–40)
Albumin/Globulin Ratio: 1.6 (ref 1.1–2.5)
Albumin: 4.1 g/dL (ref 3.5–4.8)
Alkaline Phosphatase: 42 IU/L (ref 39–117)
BUN/Creatinine Ratio: 10 — ABNORMAL LOW (ref 11–26)
BUN: 9 mg/dL (ref 8–27)
Bilirubin Total: 0.4 mg/dL (ref 0.0–1.2)
CO2: 22 mmol/L (ref 18–29)
Calcium: 9.7 mg/dL (ref 8.7–10.3)
Chloride: 102 mmol/L (ref 96–106)
Creatinine, Ser: 0.93 mg/dL (ref 0.57–1.00)
GFR calc Af Amer: 72 mL/min/{1.73_m2} (ref >59)
GFR calc non Af Amer: 62 mL/min/{1.73_m2} (ref >59)
Globulin, Total: 2.6 g/dL (ref 1.5–4.5)
Glucose: 125 mg/dL — ABNORMAL HIGH (ref 65–99)
Potassium: 3.6 mmol/L (ref 3.5–5.2)
Sodium: 144 mmol/L (ref 134–144)
Total Protein: 6.7 g/dL (ref 6.0–8.5)

## 2015-05-01 LAB — LIPID PANEL
Chol/HDL Ratio: 2.2 ratio units (ref 0.0–4.4)
Cholesterol, Total: 181 mg/dL (ref 100–199)
HDL: 84 mg/dL (ref >39)
LDL Calculated: 87 mg/dL (ref 0–99)
Triglycerides: 48 mg/dL (ref 0–149)
VLDL Cholesterol Cal: 10 mg/dL (ref 5–40)

## 2015-05-01 LAB — THYROID PANEL WITH TSH
Free Thyroxine Index: 5.1 — ABNORMAL HIGH (ref 1.2–4.9)
T3 Uptake Ratio: 44 % — ABNORMAL HIGH (ref 24–39)
T4, Total: 11.7 ug/dL (ref 4.5–12.0)
TSH: 0.054 u[IU]/mL — ABNORMAL LOW (ref 0.450–4.500)

## 2015-05-01 LAB — MICROALBUMIN / CREATININE URINE RATIO
Creatinine, Urine: 83.7 mg/dL
MICROALB/CREAT RATIO: <3.6 mg/g creat (ref 0.0–30.0)
Microalbumin, Urine: <3 ug/mL

## 2015-05-01 MED ORDER — LEVOTHYROXINE SODIUM 125 MCG PO TABS: 125.0000 ug | ORAL_TABLET | Freq: Every day | ORAL | Status: DC

## 2015-05-01 NOTE — Telephone Encounter (Signed)
-----   Message from Sharion Balloon, [place] sent at 05/01/2015  1:58 PM EST ----- HgbA1C slightly elevated- Pt needs to be on low carb diet Urine-Negative for UTI Kidney and liver function stable Cholesterol levels WNL Thyroid levels abnormal- Levothyroxine decreased to 125 mcg Microalbinuria negative

## 2015-05-01 NOTE — Telephone Encounter (Signed)
Pt notified of results Verbalizes understanding 

## 2015-05-26 ENCOUNTER — Other Ambulatory Visit: Payer: Self-pay | Admitting: Family

## 2015-06-17 ENCOUNTER — Other Ambulatory Visit: Payer: Self-pay | Admitting: Family

## 2015-06-17 NOTE — Telephone Encounter (Signed)
Last seen 04/30/15  Denise Gardner

## 2015-07-02 ENCOUNTER — Other Ambulatory Visit: Payer: Self-pay | Admitting: Family

## 2015-07-03 ENCOUNTER — Other Ambulatory Visit: Payer: Self-pay | Admitting: Family

## 2015-07-03 DIAGNOSIS — H5200 Hypermetropia, unspecified eye: Secondary | ICD-10-CM | POA: Diagnosis not present

## 2015-07-03 DIAGNOSIS — Z0100 Encounter for examination of eyes and vision without abnormal findings: Secondary | ICD-10-CM | POA: Diagnosis not present

## 2015-07-03 LAB — HM DIABETES EYE EXAM

## 2015-07-14 ENCOUNTER — Other Ambulatory Visit: Payer: Self-pay | Admitting: Family

## 2015-07-14 NOTE — Telephone Encounter (Signed)
rx called into pharmacy

## 2015-07-15 ENCOUNTER — Other Ambulatory Visit: Payer: Self-pay | Admitting: Family

## 2015-07-24 ENCOUNTER — Other Ambulatory Visit: Payer: Self-pay | Admitting: Family

## 2015-08-04 ENCOUNTER — Ambulatory Visit: Payer: Medicare Other | Admitting: Family

## 2015-08-14 ENCOUNTER — Ambulatory Visit (INDEPENDENT_AMBULATORY_CARE_PROVIDER_SITE_OTHER): Payer: PRIVATE HEALTH INSURANCE | Admitting: Family

## 2015-08-14 ENCOUNTER — Encounter: Payer: Self-pay | Admitting: Family

## 2015-08-14 VITALS — BP 126/62 | HR 79 | Temp 96.1°F | Ht 66.0 in | Wt 179.0 lb

## 2015-08-14 DIAGNOSIS — E1149 Type 2 diabetes mellitus with other diabetic neurological complication: Secondary | ICD-10-CM | POA: Diagnosis not present

## 2015-08-14 DIAGNOSIS — K2100 Gastro-esophageal reflux disease with esophagitis, without bleeding: Secondary | ICD-10-CM

## 2015-08-14 DIAGNOSIS — N3281 Overactive bladder: Secondary | ICD-10-CM | POA: Diagnosis not present

## 2015-08-14 DIAGNOSIS — E785 Hyperlipidemia, unspecified: Secondary | ICD-10-CM

## 2015-08-14 DIAGNOSIS — I10 Essential (primary) hypertension: Secondary | ICD-10-CM | POA: Diagnosis not present

## 2015-08-14 DIAGNOSIS — F419 Anxiety disorder, unspecified: Secondary | ICD-10-CM

## 2015-08-14 DIAGNOSIS — I25119 Atherosclerotic heart disease of native coronary artery with unspecified angina pectoris: Secondary | ICD-10-CM | POA: Diagnosis not present

## 2015-08-14 DIAGNOSIS — F32A Depression, unspecified: Secondary | ICD-10-CM

## 2015-08-14 DIAGNOSIS — J4520 Mild intermittent asthma, uncomplicated: Secondary | ICD-10-CM | POA: Diagnosis not present

## 2015-08-14 DIAGNOSIS — E039 Hypothyroidism, unspecified: Secondary | ICD-10-CM | POA: Diagnosis not present

## 2015-08-14 DIAGNOSIS — G6289 Other specified polyneuropathies: Secondary | ICD-10-CM | POA: Diagnosis not present

## 2015-08-14 DIAGNOSIS — J449 Chronic obstructive pulmonary disease, unspecified: Secondary | ICD-10-CM | POA: Diagnosis not present

## 2015-08-14 DIAGNOSIS — M8580 Other specified disorders of bone density and structure, unspecified site: Secondary | ICD-10-CM | POA: Diagnosis not present

## 2015-08-14 DIAGNOSIS — F329 Major depressive disorder, single episode, unspecified: Secondary | ICD-10-CM

## 2015-08-14 DIAGNOSIS — K219 Gastro-esophageal reflux disease without esophagitis: Secondary | ICD-10-CM | POA: Diagnosis not present

## 2015-08-14 DIAGNOSIS — E876 Hypokalemia: Secondary | ICD-10-CM

## 2015-08-14 LAB — BAYER DCA HB A1C WAIVED: HB A1C (BAYER DCA - WAIVED): 6.4 % (ref <7.0)

## 2015-08-14 MED ORDER — RALOXIFENE HCL 60 MG PO TABS: 60.0000 mg | ORAL_TABLET | Freq: Every day | ORAL | Status: DC

## 2015-08-14 MED ORDER — OMEPRAZOLE 20 MG PO CPDR: 20.0000 mg | DELAYED_RELEASE_CAPSULE | Freq: Two times a day (BID) | ORAL | Status: DC

## 2015-08-14 MED ORDER — ASPIRIN EC 325 MG PO TBEC: 325.0000 mg | DELAYED_RELEASE_TABLET | Freq: Every day | ORAL | Status: DC

## 2015-08-14 NOTE — Patient Instructions (Signed)
Health Maintenance, Female Adopting a healthy lifestyle and getting preventive care can go a long way to promote health and wellness. Talk with your health care provider about what schedule of regular examinations is right for you. This is a good chance for you to check in with your provider about disease prevention and staying healthy. In between checkups, there are plenty of things you can do on your own. Experts have done a lot of research about which lifestyle changes and preventive measures are most likely to keep you healthy. Ask your health care provider for more information. WEIGHT AND DIET  Eat a healthy diet  Be sure to include plenty of vegetables, fruits, low-fat dairy products, and lean protein.  Do not eat a lot of foods high in solid fats, added sugars, or salt.  Get regular exercise. This is one of the most important things you can do for your health.  Most adults should exercise for at least 150 minutes each week. The exercise should increase your heart rate and make you sweat (moderate-intensity exercise).  Most adults should also do strengthening exercises at least twice a week. This is in addition to the moderate-intensity exercise.  Maintain a healthy weight  Body mass index (BMI) is a measurement that can be used to identify possible weight problems. It estimates body fat based on height and weight. Your health care provider can help determine your BMI and help you achieve or maintain a healthy weight.  For females 20 years of age and older:   A BMI below 18.5 is considered underweight.  A BMI of 18.5 to 24.9 is normal.  A BMI of 25 to 29.9 is considered overweight.  A BMI of 30 and above is considered obese.  Watch levels of cholesterol and blood lipids  You should start having your blood tested for lipids and cholesterol at 72 years of age, then have this test every 5 years.  You may need to have your cholesterol levels checked more often if:  Your lipid  or cholesterol levels are high.  You are older than 72 years of age.  You are at high risk for heart disease.  CANCER SCREENING   Lung Cancer  Lung cancer screening is recommended for adults 55-80 years old who are at high risk for lung cancer because of a history of smoking.  A yearly low-dose CT scan of the lungs is recommended for people who:  Currently smoke.  Have quit within the past 15 years.  Have at least a 30-pack-year history of smoking. A pack year is smoking an average of one pack of cigarettes a day for 1 year.  Yearly screening should continue until it has been 15 years since you quit.  Yearly screening should stop if you develop a health problem that would prevent you from having lung cancer treatment.  Breast Cancer  Practice breast self-awareness. This means understanding how your breasts normally appear and feel.  It also means doing regular breast self-exams. Let your health care provider know about any changes, no matter how small.  If you are in your 20s or 30s, you should have a clinical breast exam (CBE) by a health care provider every 1-3 years as part of a regular health exam.  If you are 40 or older, have a CBE every year. Also consider having a breast X-ray (mammogram) every year.  If you have a family history of breast cancer, talk to your health care provider about genetic screening.  If you   are at high risk for breast cancer, talk to your health care provider about having an MRI and a mammogram every year.  Breast cancer gene (BRCA) assessment is recommended for women who have family members with BRCA-related cancers. BRCA-related cancers include:  Breast.  Ovarian.  Tubal.  Peritoneal cancers.  Results of the assessment will determine the need for genetic counseling and BRCA1 and BRCA2 testing. Cervical Cancer Your health care provider may recommend that you be screened regularly for cancer of the pelvic organs (ovaries, uterus, and  vagina). This screening involves a pelvic examination, including checking for microscopic changes to the surface of your cervix (Pap test). You may be encouraged to have this screening done every 3 years, beginning at age 21.  For women ages 30-65, health care providers may recommend pelvic exams and Pap testing every 3 years, or they may recommend the Pap and pelvic exam, combined with testing for human papilloma virus (HPV), every 5 years. Some types of HPV increase your risk of cervical cancer. Testing for HPV may also be done on women of any age with unclear Pap test results.  Other health care providers may not recommend any screening for nonpregnant women who are considered low risk for pelvic cancer and who do not have symptoms. Ask your health care provider if a screening pelvic exam is right for you.  If you have had past treatment for cervical cancer or a condition that could lead to cancer, you need Pap tests and screening for cancer for at least 20 years after your treatment. If Pap tests have been discontinued, your risk factors (such as having a new sexual partner) need to be reassessed to determine if screening should resume. Some women have medical problems that increase the chance of getting cervical cancer. In these cases, your health care provider may recommend more frequent screening and Pap tests. Colorectal Cancer  This type of cancer can be detected and often prevented.  Routine colorectal cancer screening usually begins at 72 years of age and continues through 72 years of age.  Your health care provider may recommend screening at an earlier age if you have risk factors for colon cancer.  Your health care provider may also recommend using home test kits to check for hidden blood in the stool.  A small camera at the end of a tube can be used to examine your colon directly (sigmoidoscopy or colonoscopy). This is done to check for the earliest forms of colorectal  cancer.  Routine screening usually begins at age 50.  Direct examination of the colon should be repeated every 5-10 years through 72 years of age. However, you may need to be screened more often if early forms of precancerous polyps or small growths are found. Skin Cancer  Check your skin from head to toe regularly.  Tell your health care provider about any new moles or changes in moles, especially if there is a change in a mole's shape or color.  Also tell your health care provider if you have a mole that is larger than the size of a pencil eraser.  Always use sunscreen. Apply sunscreen liberally and repeatedly throughout the day.  Protect yourself by wearing long sleeves, pants, a wide-brimmed hat, and sunglasses whenever you are outside. HEART DISEASE, DIABETES, AND HIGH BLOOD PRESSURE   High blood pressure causes heart disease and increases the risk of stroke. High blood pressure is more likely to develop in:  People who have blood pressure in the high end   of the normal range (130-139/85-89 mm Hg).  People who are overweight or obese.  People who are African American.  If you are 38-23 years of age, have your blood pressure checked every 3-5 years. If you are 61 years of age or older, have your blood pressure checked every year. You should have your blood pressure measured twice--once when you are at a hospital or clinic, and once when you are not at a hospital or clinic. Record the average of the two measurements. To check your blood pressure when you are not at a hospital or clinic, you can use:  An automated blood pressure machine at a pharmacy.  A home blood pressure monitor.  If you are between 45 years and 39 years old, ask your health care provider if you should take aspirin to prevent strokes.  Have regular diabetes screenings. This involves taking a blood sample to check your fasting blood sugar level.  If you are at a normal weight and have a low risk for diabetes,  have this test once every three years after 72 years of age.  If you are overweight and have a high risk for diabetes, consider being tested at a younger age or more often. PREVENTING INFECTION  Hepatitis B  If you have a higher risk for hepatitis B, you should be screened for this virus. You are considered at high risk for hepatitis B if:  You were born in a country where hepatitis B is common. Ask your health care provider which countries are considered high risk.  Your parents were born in a high-risk country, and you have not been immunized against hepatitis B (hepatitis B vaccine).  You have HIV or AIDS.  You use needles to inject street drugs.  You live with someone who has hepatitis B.  You have had sex with someone who has hepatitis B.  You get hemodialysis treatment.  You take certain medicines for conditions, including cancer, organ transplantation, and autoimmune conditions. Hepatitis C  Blood testing is recommended for:  Everyone born from [birth year] through 1967.  Anyone with known risk factors for hepatitis C. Sexually transmitted infections (STIs)  You should be screened for sexually transmitted infections (STIs) including gonorrhea and chlamydia if:  You are sexually active and are younger than 72 years of age.  You are older than 72 years of age and your health care provider tells you that you are at risk for this type of infection.  Your sexual activity has changed since you were last screened and you are at an increased risk for chlamydia or gonorrhea. Ask your health care provider if you are at risk.  If you do not have HIV, but are at risk, it may be recommended that you take a prescription medicine daily to prevent HIV infection. This is called pre-exposure prophylaxis (PrEP). You are considered at risk if:  You are sexually active and do not regularly use condoms or know the HIV status of your partner(s).  You take drugs by injection.  You are sexually  active with a partner who has HIV. Talk with your health care provider about whether you are at high risk of being infected with HIV. If you choose to begin PrEP, you should first be tested for HIV. You should then be tested every 3 months for as long as you are taking PrEP.  PREGNANCY   If you are premenopausal and you may become pregnant, ask your health care provider about preconception counseling.  If you may  become pregnant, take 400 to 800 micrograms (mcg) of folic acid every day.  If you want to prevent pregnancy, talk to your health care provider about birth control (contraception). OSTEOPOROSIS AND MENOPAUSE   Osteoporosis is a disease in which the bones lose minerals and strength with aging. This can result in serious bone fractures. Your risk for osteoporosis can be identified using a bone density scan.  If you are 61 years of age or older, or if you are at risk for osteoporosis and fractures, ask your health care provider if you should be screened.  Ask your health care provider whether you should take a calcium or vitamin D supplement to lower your risk for osteoporosis.  Menopause may have certain physical symptoms and risks.  Hormone replacement therapy may reduce some of these symptoms and risks. Talk to your health care provider about whether hormone replacement therapy is right for you.  HOME CARE INSTRUCTIONS   Schedule regular health, dental, and eye exams.  Stay current with your immunizations.   Do not use any tobacco products including cigarettes, chewing tobacco, or electronic cigarettes.  If you are pregnant, do not drink alcohol.  If you are breastfeeding, limit how much and how often you drink alcohol.  Limit alcohol intake to no more than 1 drink per day for nonpregnant women. One drink equals 12 ounces of beer, 5 ounces of wine, or 1 ounces of hard liquor.  Do not use street drugs.  Do not share needles.  Ask your health care provider for help if  you need support or information about quitting drugs.  Tell your health care provider if you often feel depressed.  Tell your health care provider if you have ever been abused or do not feel safe at home.   This information is not intended to replace advice given to you by your health care provider. Make sure you discuss any questions you have with your health care provider.   Document Released: 08/30/2010 Document Revised: 03/07/2014 Document Reviewed: 01/16/2013 Elsevier Interactive Patient Education [employer].

## 2015-08-14 NOTE — Progress Notes (Signed)
Subjective:    Patient ID: Denise Gardner, female    DOB: [birth year], 70 y.o.   MRN: [id number]  PT presents to the office today for chronic follow up.  Hypertension This is a chronic problem. The current episode started more than 1 year ago. The problem has been resolved since onset. The problem is controlled. Associated symptoms include anxiety, peripheral edema ("at times") and shortness of breath. Pertinent negatives include no headaches or palpitations. Risk factors for coronary artery disease include diabetes mellitus, dyslipidemia, family history, obesity, post-menopausal state and sedentary lifestyle. Past treatments include beta blockers. Hypertensive end-organ damage includes heart failure. There is no history of kidney disease, CAD/MI, CVA or a thyroid problem. There is no history of sleep apnea.  Diabetes She presents for her follow-up diabetic visit. She has type 2 diabetes mellitus. Her disease course has been worsening. Hypoglycemia symptoms include nervousness/anxiousness. Pertinent negatives for hypoglycemia include no confusion, headaches, mood changes or sleepiness. Associated symptoms include foot paresthesias and visual change. Pertinent negatives for diabetes include no foot ulcerations. Pertinent negatives for hypoglycemia complications include no blackouts and no hospitalization. Symptoms are worsening. Diabetic complications include heart disease and peripheral neuropathy. Pertinent negatives for diabetic complications include no CVA or nephropathy. Risk factors for coronary artery disease include diabetes mellitus, dyslipidemia, family history, hypertension, obesity, sedentary lifestyle and post-menopausal. Current diabetic treatment includes oral agent (triple therapy). She is compliant with treatment all of the time. She is following a generally unhealthy diet. Her breakfast blood glucose range is generally 130-140 mg/dl. An ACE inhibitor/angiotensin II receptor blocker is not  being taken. Eye exam is not current.  Hyperlipidemia This is a chronic problem. The current episode started more than 1 year ago. The problem is controlled. Recent lipid tests were reviewed and are normal. Exacerbating diseases include diabetes and hypothyroidism. Associated symptoms include shortness of breath. Current antihyperlipidemic treatment includes herbal therapy and nicotinic acid. The current treatment provides moderate improvement of lipids. Risk factors for coronary artery disease include diabetes mellitus, dyslipidemia, family history, hypertension, obesity, a sedentary lifestyle and post-menopausal.  Anxiety Presents for follow-up visit. Onset was 1 to 6 months ago. The problem has been waxing and waning. Symptoms include excessive worry, nervous/anxious behavior and shortness of breath. Patient reports no confusion, insomnia, irritability or palpitations. Symptoms occur occasionally.   Her past medical history is significant for anxiety/panic attacks, asthma and depression. Past treatments include nothing. The treatment provided mild relief. Compliance with prior treatments has been good.  Gastroesophageal Reflux She complains of a hoarse voice. She reports no belching, no coughing, no heartburn, no sore throat or no wheezing. This is a chronic problem. The current episode started more than 1 year ago. The problem occurs rarely. The problem has been resolved. The symptoms are aggravated by certain foods. She has tried a PPI and a diet change for the symptoms. The treatment provided mild relief.  Asthma She complains of frequent throat clearing, hoarse voice and shortness of breath. There is no cough or wheezing. This is a chronic problem. The current episode started more than 1 year ago. The problem occurs intermittently. The problem has been waxing and waning. Pertinent negatives include no headaches, heartburn or sore throat. Her symptoms are aggravated by change in weather. Her symptoms  are alleviated by rest and steroid inhaler. She reports moderate improvement on treatment. Her past medical history is significant for asthma and COPD.  Thyroid Problem Presents for follow-up visit. Symptoms include anxiety, constipation, dry skin, hoarse voice and  visual change. Patient reports no diarrhea or palpitations. The symptoms have been stable. Past treatments include levothyroxine. The treatment provided significant relief. Her past medical history is significant for diabetes, heart failure and hyperlipidemia.  Peripheral Neuropathy Pt currently taking gabapentin 371m TID. Pt states this seems to be helping with the "burning in her feet".    Review of Systems  Constitutional: Negative.  Negative for irritability.  HENT: Positive for hoarse voice. Negative for sore throat.   Eyes: Negative.   Respiratory: Positive for shortness of breath. Negative for cough and wheezing.   Cardiovascular: Negative.  Negative for palpitations.  Gastrointestinal: Positive for constipation. Negative for heartburn and diarrhea.  Endocrine: Negative.   Musculoskeletal: Negative.   Neurological: Negative.  Negative for headaches.  Hematological: Negative.   Psychiatric/Behavioral: Negative for confusion. The patient is nervous/anxious. The patient does not have insomnia.   All other systems reviewed and are negative.      Objective:   Physical Exam  Constitutional: She is oriented to person, place, and time. She appears well-developed and well-nourished. No distress.  HENT:  Head: Normocephalic and atraumatic.  Right Ear: External ear normal.  Mouth/Throat: Oropharynx is clear and moist.  Eyes: Pupils are equal, round, and reactive to light.  Neck: Normal range of motion. Neck supple. No thyromegaly present.  Cardiovascular: Normal rate, regular rhythm, normal heart sounds and intact distal pulses.   No murmur heard. Pulmonary/Chest: Effort normal and breath sounds normal. No respiratory  distress. She has no wheezes.  Abdominal: Soft. Bowel sounds are normal. She exhibits no distension. There is no tenderness.  Musculoskeletal: Normal range of motion. She exhibits edema (trace in BLE). She exhibits no tenderness.  Pt using a cane to walk with  Neurological: She is alert and oriented to person, place, and time. She has normal reflexes. No cranial nerve deficit.  Skin: Skin is warm and dry.  Psychiatric: She has a normal mood and affect. Her behavior is normal. Judgment and thought content normal.  Vitals reviewed.   BP 126/62 mmHg  Pulse 79  Temp(Src) 96.1 F (35.6 C) (Oral)  Ht _0  (1.676 m)  Wt 179 lb (81.194 kg)  BMI 28.91 kg/m2       Assessment & Plan:  1. Essential hypertension - CMP14+EGFR  2. Coronary artery disease involving native coronary artery of native heart with angina pectoris (HBantry - CMP14+EGFR  3. Asthma, mild intermittent, uncomplicated - CUPJ03+PRXY 4. Chronic obstructive pulmonary disease, unspecified COPD type ([facility] - CMP14+EGFR  5. Gastroesophageal reflux disease, esophagitis presence not specified - CMP14+EGFR  6. Hypothyroidism, unspecified hypothyroidism type - CMP14+EGFR - Thyroid Panel With TSH  7. Diabetes mellitus type 2 with neurological manifestations (HCC) - CMP14+EGFR  8. Other polyneuropathy (HPutnam - CMP14+EGFR - Bayer DCA Hb A1c Waived  9. OAB (overactive bladder) - CMP14+EGFR  10. Osteopenia - raloxifene (EVISTA) 60 MG tablet; Take 1 tablet (60 mg total) by mouth daily.  Dispense: 90 tablet; Refill: 3 - CMP14+EGFR  11. Anxiety - CMP14+EGFR  12. Hyperlipidemia - CMP14+EGFR - Lipid panel  13. Depression - CMP14+EGFR  14. Hypokalemia - CMP14+EGFR  15. Gastroesophageal reflux disease with esophagitis - omeprazole (PRILOSEC) 20 MG capsule; Take 1 capsule (20 mg total) by mouth 2 (two) times daily before a meal.  Dispense: 180 capsule; Refill: 0 - CMP14+EGFR   Continue all meds Labs  pending Health Maintenance reviewed Diet and exercise encouraged RTO 3 months  CEvelina Dun FNP

## 2015-08-15 LAB — CMP14+EGFR
ALT: 10 IU/L (ref 0–32)
AST: 16 IU/L (ref 0–40)
Albumin/Globulin Ratio: 1.5 (ref 1.2–2.2)
Albumin: 4 g/dL (ref 3.5–4.8)
Alkaline Phosphatase: 39 IU/L (ref 39–117)
BUN/Creatinine Ratio: 13 (ref 12–28)
BUN: 14 mg/dL (ref 8–27)
Bilirubin Total: 0.4 mg/dL (ref 0.0–1.2)
CO2: 19 mmol/L (ref 18–29)
Calcium: 9.8 mg/dL (ref 8.7–10.3)
Chloride: 102 mmol/L (ref 96–106)
Creatinine, Ser: 1.12 mg/dL — ABNORMAL HIGH (ref 0.57–1.00)
GFR calc Af Amer: 57 mL/min/{1.73_m2} — ABNORMAL LOW (ref >59)
GFR calc non Af Amer: 49 mL/min/{1.73_m2} — ABNORMAL LOW (ref >59)
Globulin, Total: 2.7 g/dL (ref 1.5–4.5)
Glucose: 77 mg/dL (ref 65–99)
Potassium: 4.1 mmol/L (ref 3.5–5.2)
Sodium: 140 mmol/L (ref 134–144)
Total Protein: 6.7 g/dL (ref 6.0–8.5)

## 2015-08-15 LAB — THYROID PANEL WITH TSH
Free Thyroxine Index: 2.5 (ref 1.2–4.9)
T3 Uptake Ratio: 35 % (ref 24–39)
T4, Total: 7.1 ug/dL (ref 4.5–12.0)
TSH: 1.4 u[IU]/mL (ref 0.450–4.500)

## 2015-08-15 LAB — LIPID PANEL
Chol/HDL Ratio: 2.4 ratio units (ref 0.0–4.4)
Cholesterol, Total: 194 mg/dL (ref 100–199)
HDL: 82 mg/dL (ref >39)
LDL Calculated: 102 mg/dL — ABNORMAL HIGH (ref 0–99)
Triglycerides: 50 mg/dL (ref 0–149)
VLDL Cholesterol Cal: 10 mg/dL (ref 5–40)

## 2015-08-17 ENCOUNTER — Telehealth: Payer: Self-pay | Admitting: Family

## 2015-08-17 NOTE — Telephone Encounter (Signed)
Patient aware of results.

## 2015-09-09 ENCOUNTER — Other Ambulatory Visit: Payer: Self-pay | Admitting: Family

## 2015-09-10 NOTE — Telephone Encounter (Signed)
Last seen 08/14/15  Denise Gardner  If approved route to nurse to call into [facility]  [phone]

## 2015-09-10 NOTE — Telephone Encounter (Signed)
Refill called to [facility] 

## 2015-09-17 ENCOUNTER — Other Ambulatory Visit: Payer: Self-pay | Admitting: Family

## 2015-09-26 ENCOUNTER — Other Ambulatory Visit: Payer: Self-pay | Admitting: Family

## 2015-09-26 DIAGNOSIS — E1149 Type 2 diabetes mellitus with other diabetic neurological complication: Secondary | ICD-10-CM

## 2015-10-02 ENCOUNTER — Other Ambulatory Visit: Payer: Self-pay | Admitting: Family

## 2015-10-02 DIAGNOSIS — F32A Depression, unspecified: Secondary | ICD-10-CM

## 2015-10-02 DIAGNOSIS — F419 Anxiety disorder, unspecified: Secondary | ICD-10-CM

## 2015-10-02 DIAGNOSIS — F329 Major depressive disorder, single episode, unspecified: Secondary | ICD-10-CM

## 2015-10-14 ENCOUNTER — Other Ambulatory Visit: Payer: Self-pay | Admitting: Family

## 2015-10-14 DIAGNOSIS — J309 Allergic rhinitis, unspecified: Secondary | ICD-10-CM

## 2015-10-14 DIAGNOSIS — J069 Acute upper respiratory infection, unspecified: Secondary | ICD-10-CM

## 2015-10-17 ENCOUNTER — Other Ambulatory Visit: Payer: Self-pay | Admitting: Family

## 2015-11-13 ENCOUNTER — Other Ambulatory Visit: Payer: Self-pay | Admitting: Family

## 2015-11-13 DIAGNOSIS — K2100 Gastro-esophageal reflux disease with esophagitis, without bleeding: Secondary | ICD-10-CM

## 2015-11-16 ENCOUNTER — Other Ambulatory Visit: Payer: Self-pay | Admitting: Family

## 2015-11-17 ENCOUNTER — Other Ambulatory Visit: Payer: Self-pay | Admitting: Family

## 2015-11-19 ENCOUNTER — Encounter: Payer: Self-pay | Admitting: Family

## 2015-11-19 ENCOUNTER — Ambulatory Visit (INDEPENDENT_AMBULATORY_CARE_PROVIDER_SITE_OTHER): Payer: Medicare HMO | Admitting: Family

## 2015-11-19 VITALS — BP 104/64 | HR 75 | Temp 95.6°F | Ht 66.0 in | Wt 177.8 lb

## 2015-11-19 DIAGNOSIS — J4520 Mild intermittent asthma, uncomplicated: Secondary | ICD-10-CM | POA: Diagnosis not present

## 2015-11-19 DIAGNOSIS — F329 Major depressive disorder, single episode, unspecified: Secondary | ICD-10-CM | POA: Diagnosis not present

## 2015-11-19 DIAGNOSIS — I25119 Atherosclerotic heart disease of native coronary artery with unspecified angina pectoris: Secondary | ICD-10-CM

## 2015-11-19 DIAGNOSIS — J449 Chronic obstructive pulmonary disease, unspecified: Secondary | ICD-10-CM

## 2015-11-19 DIAGNOSIS — M8580 Other specified disorders of bone density and structure, unspecified site: Secondary | ICD-10-CM | POA: Diagnosis not present

## 2015-11-19 DIAGNOSIS — I10 Essential (primary) hypertension: Secondary | ICD-10-CM

## 2015-11-19 DIAGNOSIS — E785 Hyperlipidemia, unspecified: Secondary | ICD-10-CM

## 2015-11-19 DIAGNOSIS — E039 Hypothyroidism, unspecified: Secondary | ICD-10-CM

## 2015-11-19 DIAGNOSIS — N3281 Overactive bladder: Secondary | ICD-10-CM

## 2015-11-19 DIAGNOSIS — G6289 Other specified polyneuropathies: Secondary | ICD-10-CM

## 2015-11-19 DIAGNOSIS — E1149 Type 2 diabetes mellitus with other diabetic neurological complication: Secondary | ICD-10-CM | POA: Diagnosis not present

## 2015-11-19 DIAGNOSIS — F32A Depression, unspecified: Secondary | ICD-10-CM

## 2015-11-19 DIAGNOSIS — K219 Gastro-esophageal reflux disease without esophagitis: Secondary | ICD-10-CM | POA: Diagnosis not present

## 2015-11-19 DIAGNOSIS — R69 Illness, unspecified: Secondary | ICD-10-CM | POA: Diagnosis not present

## 2015-11-19 DIAGNOSIS — F419 Anxiety disorder, unspecified: Secondary | ICD-10-CM | POA: Diagnosis not present

## 2015-11-19 LAB — BAYER DCA HB A1C WAIVED: HB A1C (BAYER DCA - WAIVED): 6.2 % (ref <7.0)

## 2015-11-19 MED ORDER — IPRATROPIUM-ALBUTEROL 0.5-2.5 (3) MG/3ML IN SOLN: RESPIRATORY_TRACT | 5 refills | Status: DC

## 2015-11-19 NOTE — Progress Notes (Signed)
Subjective:    Patient ID: Denise Gardner, female    DOB: March 30, 1945, 70 y.o.   MRN: [id number]  PT presents to the office today for chronic follow up. PT's mother passed away 2015/10/26 so she has been dealing with the stress of this.  Hypertension  This is a chronic problem. The current episode started more than 1 year ago. The problem has been resolved since onset. The problem is controlled. Associated symptoms include anxiety, peripheral edema ("at times") and shortness of breath. Pertinent negatives include no headaches or palpitations. Risk factors for coronary artery disease include diabetes mellitus, dyslipidemia, family history, obesity, post-menopausal state and sedentary lifestyle. Past treatments include beta blockers. Hypertensive end-organ damage includes heart failure. There is no history of kidney disease, CAD/MI, CVA or a thyroid problem. There is no history of sleep apnea.  Diabetes  She presents for her follow-up diabetic visit. She has type 2 diabetes mellitus. Her disease course has been worsening. Hypoglycemia symptoms include nervousness/anxiousness. Pertinent negatives for hypoglycemia include no confusion, headaches, mood changes or sleepiness. Associated symptoms include foot paresthesias and visual change. Pertinent negatives for diabetes include no foot ulcerations. Pertinent negatives for hypoglycemia complications include no blackouts and no hospitalization. Symptoms are worsening. Diabetic complications include heart disease and peripheral neuropathy. Pertinent negatives for diabetic complications include no CVA or nephropathy. Risk factors for coronary artery disease include diabetes mellitus, dyslipidemia, family history, hypertension, obesity, sedentary lifestyle and post-menopausal. Current diabetic treatment includes oral agent (triple therapy). She is compliant with treatment all of the time. She is following a generally unhealthy diet. Her breakfast blood glucose range is  generally 130-140 mg/dl. An ACE inhibitor/angiotensin II receptor blocker is not being taken. Eye exam is current.  Hyperlipidemia  This is a chronic problem. The current episode started more than 1 year ago. The problem is uncontrolled. Recent lipid tests were reviewed and are high. Exacerbating diseases include diabetes, hypothyroidism and obesity. Associated symptoms include shortness of breath. Current antihyperlipidemic treatment includes herbal therapy and nicotinic acid. The current treatment provides moderate improvement of lipids. Risk factors for coronary artery disease include diabetes mellitus, dyslipidemia, family history, hypertension, obesity, a sedentary lifestyle and post-menopausal.  Anxiety  Presents for follow-up visit. Onset was 1 to 6 months ago. The problem has been waxing and waning. Symptoms include excessive worry, nervous/anxious behavior and shortness of breath. Patient reports no confusion, insomnia, irritability or palpitations. Symptoms occur occasionally.   Her past medical history is significant for anxiety/panic attacks, asthma and depression. Past treatments include nothing. The treatment provided mild relief. Compliance with prior treatments has been good.  Gastroesophageal Reflux  She complains of heartburn and a hoarse voice. She reports no belching, no coughing, no sore throat or no wheezing. This is a chronic problem. The current episode started more than 1 year ago. The problem occurs occasionally. The problem has been resolved. The symptoms are aggravated by certain foods. She has tried a PPI and a diet change for the symptoms. The treatment provided moderate relief.  Asthma  She complains of frequent throat clearing, hoarse voice and shortness of breath. There is no cough or wheezing. This is a chronic problem. The current episode started more than 1 year ago. The problem occurs intermittently. The problem has been waxing and waning. Associated symptoms include  heartburn. Pertinent negatives include no headaches or sore throat. Her symptoms are aggravated by change in weather. Her symptoms are alleviated by rest and steroid inhaler. She reports moderate improvement on treatment. Her past  medical history is significant for asthma and COPD.  Thyroid Problem  Presents for follow-up visit. Symptoms include anxiety, constipation, dry skin, hoarse voice and visual change. Patient reports no diarrhea or palpitations. The symptoms have been stable. Past treatments include levothyroxine. The treatment provided significant relief. Her past medical history is significant for diabetes, heart failure and hyperlipidemia.  Peripheral Neuropathy Pt currently taking gabapentin 374m TID. Pt states this seems to be helping with the "burning in her feet".  Osteopenia PT currently taking Evista 60 mg daily. Last Bone Density Scan 10/23/14. OAB PT states she does not drink soft drinks now and is not taking any medications. PT states since stopping her soft drinks this has improved.  COPD PT uses albuterol as needed. Currently does not smoke.  Review of Systems  Constitutional: Negative.  Negative for irritability.  HENT: Positive for hoarse voice. Negative for sore throat.   Eyes: Negative.   Respiratory: Positive for shortness of breath. Negative for cough and wheezing.   Cardiovascular: Negative.  Negative for palpitations.  Gastrointestinal: Positive for constipation and heartburn. Negative for diarrhea.  Endocrine: Negative.   Musculoskeletal: Negative.   Neurological: Negative.  Negative for headaches.  Hematological: Negative.   Psychiatric/Behavioral: Negative for confusion. The patient is nervous/anxious. The patient does not have insomnia.   All other systems reviewed and are negative.      Objective:   Physical Exam  Constitutional: She is oriented to person, place, and time. She appears well-developed and well-nourished. No distress.  HENT:  Head:  Normocephalic and atraumatic.  Right Ear: External ear normal.  Mouth/Throat: Oropharynx is clear and moist.  Eyes: Pupils are equal, round, and reactive to light.  Neck: Normal range of motion. Neck supple. No thyromegaly present.  Cardiovascular: Normal rate, regular rhythm, normal heart sounds and intact distal pulses.   No murmur heard. Pulmonary/Chest: Effort normal and breath sounds normal. No respiratory distress. She has no wheezes.  Abdominal: Soft. Bowel sounds are normal. She exhibits no distension. There is no tenderness.  Musculoskeletal: Normal range of motion. She exhibits edema (trace in BLE). She exhibits no tenderness.  Pt using a cane to walk with  Neurological: She is alert and oriented to person, place, and time. She has normal reflexes. No cranial nerve deficit.  Skin: Skin is warm and dry.  Psychiatric: She has a normal mood and affect. Her behavior is normal. Judgment and thought content normal.  Vitals reviewed.   BP 104/64   Pulse 75   Temp (!) 95.6 F (35.3 C) (Oral)   Ht _0  (1.676 m)   Wt 177 lb 12.8 oz (80.6 kg)   BMI 28.70 kg/m        Assessment & Plan:  1. Asthma, mild intermittent, uncomplicated - ipratropium-albuterol (DUONEB) 0.5-2.5 (3) MG/3ML SOLN; USE 1 VIAL IN NEBULIZER EVERY 6 HOURS  Dispense: 540 mL; Refill: 5 - CMP14+EGFR  2. Essential hypertension - CMP14+EGFR  3. Coronary artery disease involving native coronary artery of native heart with angina pectoris (HCC) - CMP14+EGFR  4. Chronic obstructive pulmonary disease, unspecified COPD type ([facility]mporium - CMP14+EGFR  5. Gastroesophageal reflux disease, esophagitis presence not specified - CMP14+EGFR  6. Diabetes mellitus type 2 with neurological manifestations (HCC) - Bayer DCA Hb A1c Waived - CMP14+EGFR  7. Hypothyroidism, unspecified hypothyroidism type - CMP14+EGFR  8. Other polyneuropathy (HBolivar - CMP14+EGFR  9. Osteopenia - CMP14+EGFR  10. OAB (overactive bladder) -  CMP14+EGFR  11. Anxiety - CMP14+EGFR  12. Depression - CMP14+EGFR  13. Hyperlipidemia - CMP14+EGFR   Continue all meds Labs pending Health Maintenance reviewed Diet and exercise encouraged RTO 3 months  Evelina Dun, FNP

## 2015-11-19 NOTE — Patient Instructions (Signed)
Insomnia Insomnia is a sleep disorder that makes it difficult to fall asleep or to stay asleep. Insomnia can cause tiredness (fatigue), low energy, difficulty concentrating, mood swings, and poor performance at work or school.  There are three different ways to classify insomnia:  Difficulty falling asleep.  Difficulty staying asleep.  Waking up too early in the morning. Any type of insomnia can be long-term (chronic) or short-term (acute). Both are common. Short-term insomnia usually lasts for three months or less. Chronic insomnia occurs at least three times a week for longer than three months. CAUSES  Insomnia may be caused by another condition, situation, or substance, such as:  Anxiety.  Certain medicines.  Gastroesophageal reflux disease (GERD) or other gastrointestinal conditions.  Asthma or other breathing conditions.  Restless legs syndrome, sleep apnea, or other sleep disorders.  Chronic pain.  Menopause. This may include hot flashes.  Stroke.  Abuse of alcohol, tobacco, or illegal drugs.  Depression.  Caffeine.   Neurological disorders, such as Alzheimer disease.  An overactive thyroid (hyperthyroidism). The cause of insomnia may not be known. RISK FACTORS Risk factors for insomnia include:  Gender. Women are more commonly affected than men.  Age. Insomnia is more common as you get older.  Stress. This may involve your professional or personal life.  Income. Insomnia is more common in people with lower income.  Lack of exercise.   Irregular work schedule or night shifts.  Traveling between different time zones. SIGNS AND SYMPTOMS If you have insomnia, trouble falling asleep or trouble staying asleep is the main symptom. This may lead to other symptoms, such as:  Feeling fatigued.  Feeling nervous about going to sleep.  Not feeling rested in the morning.  Having trouble concentrating.  Feeling irritable, anxious, or depressed. TREATMENT   Treatment for insomnia depends on the cause. If your insomnia is caused by an underlying condition, treatment will focus on addressing the condition. Treatment may also include:   Medicines to help you sleep.  Counseling or therapy.  Lifestyle adjustments. HOME CARE INSTRUCTIONS   Take medicines only as directed by your health care provider.  Keep regular sleeping and waking hours. Avoid naps.  Keep a sleep diary to help you and your health care provider figure out what could be causing your insomnia. Include:   When you sleep.  When you wake up during the night.  How well you sleep.   How rested you feel the next day.  Any side effects of medicines you are taking.  What you eat and drink.   Make your bedroom a comfortable place where it is easy to fall asleep:  Put up shades or special blackout curtains to block light from outside.  Use a white noise machine to block noise.  Keep the temperature cool.   Exercise regularly as directed by your health care provider. Avoid exercising right before bedtime.  Use relaxation techniques to manage stress. Ask your health care provider to suggest some techniques that may work well for you. These may include:  Breathing exercises.  Routines to release muscle tension.  Visualizing peaceful scenes.  Cut back on alcohol, caffeinated beverages, and cigarettes, especially close to bedtime. These can disrupt your sleep.  Do not overeat or eat spicy foods right before bedtime. This can lead to digestive discomfort that can make it hard for you to sleep.  Limit screen use before bedtime. This includes:  Watching TV.  Using your smartphone, tablet, and computer.  Stick to a routine. This   can help you fall asleep faster. Try to do a quiet activity, brush your teeth, and go to bed at the same time each night.  Get out of bed if you are still awake after 15 minutes of trying to sleep. Keep the lights down, but try reading or  doing a quiet activity. When you feel sleepy, go back to bed.  Make sure that you drive carefully. Avoid driving if you feel very sleepy.  Keep all follow-up appointments as directed by your health care provider. This is important. SEEK MEDICAL CARE IF:   You are tired throughout the day or have trouble in your daily routine due to sleepiness.  You continue to have sleep problems or your sleep problems get worse. SEEK IMMEDIATE MEDICAL CARE IF:   You have serious thoughts about hurting yourself or someone else.   This information is not intended to replace advice given to you by your health care provider. Make sure you discuss any questions you have with your health care provider.   Document Released: 02/12/2000 Document Revised: 11/05/2014 Document Reviewed: 11/15/2013 Elsevier Interactive Patient Education 2016 [employer]  

## 2015-11-20 LAB — CMP14+EGFR
ALT: 10 IU/L (ref 0–32)
AST: 15 IU/L (ref 0–40)
Albumin/Globulin Ratio: 1.7 (ref 1.2–2.2)
Albumin: 4.3 g/dL (ref 3.5–4.8)
Alkaline Phosphatase: 43 IU/L (ref 39–117)
BUN/Creatinine Ratio: 14 (ref 12–28)
BUN: 18 mg/dL (ref 8–27)
Bilirubin Total: 0.5 mg/dL (ref 0.0–1.2)
CO2: 23 mmol/L (ref 18–29)
Calcium: 9.9 mg/dL (ref 8.7–10.3)
Chloride: 98 mmol/L (ref 96–106)
Creatinine, Ser: 1.27 mg/dL — ABNORMAL HIGH (ref 0.57–1.00)
GFR calc Af Amer: 49 mL/min/{1.73_m2} — ABNORMAL LOW (ref >59)
GFR calc non Af Amer: 42 mL/min/{1.73_m2} — ABNORMAL LOW (ref >59)
Globulin, Total: 2.6 g/dL (ref 1.5–4.5)
Glucose: 107 mg/dL — ABNORMAL HIGH (ref 65–99)
Potassium: 4.5 mmol/L (ref 3.5–5.2)
Sodium: 137 mmol/L (ref 134–144)
Total Protein: 6.9 g/dL (ref 6.0–8.5)

## 2015-11-25 ENCOUNTER — Ambulatory Visit (INDEPENDENT_AMBULATORY_CARE_PROVIDER_SITE_OTHER): Payer: Medicare HMO | Admitting: *Deleted

## 2015-11-25 VITALS — BP 98/56 | HR 66 | Ht 65.0 in | Wt 181.0 lb

## 2015-11-25 DIAGNOSIS — Z0000 Encounter for general adult medical examination without abnormal findings: Secondary | ICD-10-CM

## 2015-11-25 NOTE — Patient Instructions (Addendum)
  Denise Gardner ,  Thank you for taking time to come for your Medicare Wellness Visit. I appreciate your ongoing commitment to your health goals. Please review the following plan we discussed and let me know if I can assist you in the future.   These are the goals we discussed:  Goals    . Exercise 3x per week (30 min per time)          Walk for 30 minutes 3 times week           This is a list of the screening recommended for you and due dates:  Health Maintenance  Topic Date Due  . Shingles Vaccine  08/13/2016*  . Flu Shot  04/17/2018*  . Complete foot exam   04/29/2016  . Urine Protein Check  04/29/2016  . Hemoglobin A1C  05/18/2016  . Eye exam for diabetics  07/02/2016  . Mammogram  08/18/2016  . Tetanus Vaccine  09/06/2022  . Colon Cancer Screening  07/02/2025  . DEXA scan (bone density measurement)  Completed  .  Hepatitis C: One time screening is recommended by Center for Disease Control  (CDC) for  adults born from [birth year] through 1967.   Completed  . Pneumonia vaccines  Completed  *Topic was postponed. The date shown is not the original due date.

## 2015-11-27 NOTE — Progress Notes (Signed)
Subjective:   Denise Gardner is a 71 y.o. female who presents for an Initial Medicare Annual Wellness Visit. Denise Gardner is retired from the [employer] and doesn't currently have any hobbies. She is living with her estranged husband for the time being until her home is repaired. She isn't involved in any church or social organizations but she does enjoy visits from the "[place]." She has many siblings and talks with them frequently. She does have any children and recently lost her mother, whom she looked after. She is dealing with this loss well. She does have 2 chickens and outdoor cats as pets.   Review of Systems     Cardiac Risk Factors include: advanced age (>66men, >56 women);obesity (BMI >30kg/m2);sedentary lifestyle;hypertension;dyslipidemia  All systems negative.     Objective:    Today's Vitals   11/25/15 1525  BP: (!) 98/56  Pulse: 66  Weight: 181 lb (82.1 kg)  Height: 5\' 5"  (1.651 m)   Body mass index is 30.12 kg/m.   Current Medications (verified) Outpatient Encounter Prescriptions as of 11/25/2015  Medication Sig  . ACCU-CHEK FASTCLIX LANCETS MISC Test blood sugar bid. Dx E11.40  . amLODipine (NORVASC) 10 MG tablet TAKE 1 TABLET(10 MG) BY MOUTH DAILY  . aspirin EC 325 MG tablet Take 1 tablet (325 mg total) by mouth daily.  . cholecalciferol (VITAMIN D) 400 UNITS TABS Take by mouth. VITAMIN D3 daily   . diazepam (VALIUM) 5 MG tablet TAKE 1 TABLET BY MOUTH EVERY 12 HOURS AS NEEDED  . escitalopram (LEXAPRO) 10 MG tablet TAKE 1 TABLET BY MOUTH EVERY DAY  . fluticasone (FLONASE) 50 MCG/ACT nasal spray SHAKE LIQUID AND USE 2 SPRAYS IN EACH NOSTRIL DAILY  . gabapentin (NEURONTIN) 300 MG capsule TAKE 1 CAPSULE BY MOUTH THREE TIMES DAILY  . glipiZIDE (GLUCOTROL XL) 5 MG 24 hr tablet TAKE 2 TABLETS BY MOUTH EVERY MORNING AND 1 TABLET EVERY EVENING  . glucose blood (ACCU-CHEK AVIVA PLUS) test strip Check blood sugar bid. Dx E11.40  . INVOKANA 100 MG TABS  tablet TAKE 1 TABLET(100 MG) BY MOUTH DAILY BEFORE BREAKFAST  . ipratropium-albuterol (DUONEB) 0.5-2.5 (3) MG/3ML SOLN USE 1 VIAL IN NEBULIZER EVERY 6 HOURS  . isosorbide mononitrate (IMDUR) 60 MG 24 hr tablet Take 1 tablet (60 mg total) by mouth every morning.  Marland Kitchen JANUVIA 100 MG tablet TAKE 1 TABLET BY MOUTH EVERY DAY  . levothyroxine (SYNTHROID, LEVOTHROID) 125 MCG tablet Take 1 tablet (125 mcg total) by mouth daily.  . metFORMIN (GLUCOPHAGE) 1000 MG tablet TAKE 1 TABLET BY MOUTH TWICE DAILY WITH A MEAL  . metoprolol tartrate (LOPRESSOR) 25 MG tablet TAKE 1 TABLET BY MOUTH TWICE DAILY  . misoprostol (CYTOTEC) 200 MCG tablet TAKE 1 TABLET(200 MCG) BY MOUTH FOUR TIMES DAILY  . montelukast (SINGULAIR) 10 MG tablet TAKE 1 TABLET BY MOUTH EVERY NIGHT AT BEDTIME  . naproxen (NAPROSYN) 500 MG tablet TAKE 1 TABLET(500 MG) BY MOUTH TWICE DAILY WITH A MEAL  . niacin (NIASPAN) 1000 MG CR tablet Take 2 tablets (2,000 mg total) by mouth at bedtime.  . nitroGLYCERIN (NITROLINGUAL) 0.4 MG/SPRAY spray USE AS DIRECTED FOR CHEST PAIN  . Omega-3 Fatty Acids (FISH OIL) 1000 MG CAPS Take by mouth daily.    Marland Kitchen omeprazole (PRILOSEC) 20 MG capsule TAKE 1 CAPSULE(20 MG) BY MOUTH TWICE DAILY BEFORE A MEAL  . potassium chloride SA (K-DUR,KLOR-CON) 20 MEQ tablet TAKE 1 TABLET BY MOUTH EVERY DAY  . QVAR 40 MCG/ACT inhaler INHALE  2 PUFFS BY MOUTH TWICE DAILY  . raloxifene (EVISTA) 60 MG tablet Take 1 tablet (60 mg total) by mouth daily.  Marland Kitchen VASCEPA 1 g CAPS TAKE 4 CAPSULES BY MOUTH EVERY DAY   No facility-administered encounter medications on file as of 11/25/2015.     Allergies (verified) Milk-related compounds; Crestor [rosuvastatin]; Eggs or egg-derived products; Other; and Zetia [ezetimibe]   History: Past Medical History:  Diagnosis Date  . Anxiety    depression  . Asthma   . Chronic back pain   . Coronary artery disease    Taxus stent to RCA 2008 2.5 x 16, non obstructive 15% proximal right coronary artery,  patent curcumflex LAD, preserved LV  . GERD (gastroesophageal reflux disease)   . Hypertension   . Insomnia   . Migraine   . Neuropathy ([place])   . Shingles   . Thyroid disease    Past Surgical History:  Procedure Laterality Date  . TOTAL ABDOMINAL HYSTERECTOMY     Family History  Problem Relation Age of Onset  . Stroke Mother 59  . Coronary artery disease Brother 8  . Coronary artery disease Brother 52  . Cancer Sister     breast  . Heart attack Brother    Social History   Occupational History  . retired     Equities trader    Social History Main Topics  . Smoking status: Never Smoker  . Smokeless tobacco: Never Used  . Alcohol use No  . Drug use: No  . Sexual activity: No    Tobacco Counseling No tobacco use  Activities of Daily Living In your present state of health, do you have any difficulty performing the following activities: 11/25/2015  Hearing? N  Vision? N  Difficulty concentrating or making decisions? Y  Walking or climbing stairs? Y  Dressing or bathing? N  Doing errands, shopping? N  Preparing Food and eating ? N  Using the Toilet? N  In the past six months, have you accidently leaked urine? Y  Do you have problems with loss of bowel control? N  Managing your Medications? N  Managing your Finances? N  Housekeeping or managing your Housekeeping? N  Some recent data might be hidden    Immunizations and Health Maintenance Immunization History  Administered Date(s) Administered  . Pneumococcal Conjugate-13 12/20/2013  . Pneumococcal Polysaccharide-23 10/22/2010  . Tdap 09/05/2012   There are no preventive care reminders to display for this patient.  Patient Care Team: Sharion Balloon, FNP as PCP - General (Nurse Practitioner) Minus Breeding, MD as Attending Physician (Cardiology) Truc Manus Gunning, OD (Optometry)      Assessment:   This is a routine wellness examination for Denise Gardner.  Hearing/Vision screen No hearing or vision deficits noted.  Patient sees well with her glasses. Her last eye exam was 2 months ago.   Dietary issues and exercise activities discussed: Exercise is limited by use of assistive devices.   Goals    . Exercise 3x per week (30 min per time)          Walk for 30 minutes 3 times week         Depression Screen PHQ 2/9 Scores 11/25/2015 11/19/2015 08/14/2015 01/27/2015 07/18/2014 12/20/2013 08/06/2013  PHQ - 2 Score 0 0 0 0 0 0 0    Fall Risk Fall Risk  11/25/2015 11/19/2015 08/14/2015 01/27/2015 07/18/2014  Falls in the past year? Yes Yes Yes Yes Yes  Number falls in past yr: 1 1 1 1  1  Injury with Fall? No No Yes Yes Yes  Risk for fall due to : History of fall(s);Impaired balance/gait;Medication side effect - - - -  Follow up Falls prevention discussed - - - -    Cognitive Function: MMSE - Mini Mental State Exam 11/25/2015  Orientation to time 5  Orientation to Place 5  Registration 3  Attention/ Calculation 5  Recall 3  Language- name 2 objects 2  Language- repeat 1  Language- follow 3 step command 3  Language- read & follow direction 1  Write a sentence 1  Copy design 1  Total score 30    Screening Tests Health Maintenance  Topic Date Due  . ZOSTAVAX  08/13/2016 (Originally 06/15/2003)  . INFLUENZA VACCINE  04/17/2018 (Originally 09/29/2015)  . FOOT EXAM  04/29/2016  . URINE MICROALBUMIN  04/29/2016  . HEMOGLOBIN A1C  05/18/2016  . OPHTHALMOLOGY EXAM  07/02/2016  . MAMMOGRAM  08/18/2016  . TETANUS/TDAP  09/06/2022  . COLONOSCOPY  07/02/2025  . DEXA SCAN  Completed  . Hepatitis C Screening  Completed  . PNA vac Low Risk Adult  Completed      Plan:    Appointment scheduled with Tammy Eckard, Pharm-D to discuss polypharmacy and diabetes 12/01/15  Appt scheduled with Evelina Dun, FNP for CPE 02/18/16  During the course of the visit, Denise Gardner was educated and counseled about the following appropriate screening and preventive services:   Vaccines: Flu postponed until next visit and  other vaccines are up to date.   Colorectal cancer screening-Return FOBT  Bone density screening-done 09/2014  Glaucoma screening-yearly eye exam up to date  Mammography- up to date  Nutrition counseling   Patient Instructions (the written plan) were given to the patient.    Chong Sicilian, RN   11/27/2015    I have reviewed and agree with the above AWV documentation.   Evelina Dun, FNP

## 2015-12-01 ENCOUNTER — Ambulatory Visit (INDEPENDENT_AMBULATORY_CARE_PROVIDER_SITE_OTHER): Payer: Medicare HMO | Admitting: Pharmacist

## 2015-12-01 ENCOUNTER — Other Ambulatory Visit: Payer: Self-pay | Admitting: Family

## 2015-12-01 ENCOUNTER — Encounter: Payer: Self-pay | Admitting: Pharmacist

## 2015-12-01 VITALS — BP 120/70 | HR 80 | Ht 65.0 in | Wt 180.0 lb

## 2015-12-01 DIAGNOSIS — E1149 Type 2 diabetes mellitus with other diabetic neurological complication: Secondary | ICD-10-CM | POA: Diagnosis not present

## 2015-12-01 DIAGNOSIS — R42 Dizziness and giddiness: Secondary | ICD-10-CM | POA: Diagnosis not present

## 2015-12-01 DIAGNOSIS — G6289 Other specified polyneuropathies: Secondary | ICD-10-CM

## 2015-12-01 DIAGNOSIS — M159 Polyosteoarthritis, unspecified: Secondary | ICD-10-CM

## 2015-12-01 DIAGNOSIS — Z79899 Other long term (current) drug therapy: Secondary | ICD-10-CM | POA: Diagnosis not present

## 2015-12-01 MED ORDER — BLOOD GLUCOSE MONITOR KIT: PACK | 0 refills | Status: DC

## 2015-12-01 MED ORDER — GETGO ROLLING WALKER MISC: 0 refills | Status: DC

## 2015-12-01 MED ORDER — HEALTH SENSE BP MONITOR DEVI: 1.0000 | Freq: Every day | 0 refills | Status: DC

## 2015-12-01 NOTE — Patient Instructions (Signed)
Stop Invokana Stop Niacin / Niaspan  I have sent in prescription for your test strips and lancet - this should be covered by Medicare.

## 2015-12-01 NOTE — Progress Notes (Signed)
Patient ID: Denise Gardner, female   DOB: Jul 19, 1945, 70 y.o.   MRN: [id number]  Subjective:    Denise Gardner is a 70 y.o. female who presents for an initial evaluation of Type 2 diabetes mellitus and medication review.  Denise Gardner was seen by Chong Sicilian, RN for Annual Wellness Visit and subsequently referred to me to discuss diabetes medications.  Denise Gardner voices concern that she is taking too much medication.  She has been having some dizziness but is unsure if this is related to BP (last BP 11/25/2015 was 98/56) or hypoglycemia.  She has not taken BG when she has felt dizzy so no recently confirmed hypoglycemia.   Outpatient Encounter Prescriptions as of 12/01/2015  Medication Sig Note  . ACCU-CHEK FASTCLIX LANCETS MISC Test blood sugar bid. Dx E11.40   . amLODipine (NORVASC) 10 MG tablet TAKE 1 TABLET(10 MG) BY MOUTH DAILY   . aspirin EC 325 MG tablet Take 1 tablet (325 mg total) by mouth daily.   . cholecalciferol (VITAMIN D) 400 UNITS TABS Take by mouth. VITAMIN D3 daily    . diazepam (VALIUM) 5 MG tablet TAKE 1 TABLET BY MOUTH EVERY 12 HOURS AS NEEDED (Patient taking differently: 1tpoqhs)   . escitalopram (LEXAPRO) 10 MG tablet TAKE 1 TABLET BY MOUTH EVERY DAY   . fluticasone (FLONASE) 50 MCG/ACT nasal spray SHAKE LIQUID AND USE 2 SPRAYS IN EACH NOSTRIL DAILY   . gabapentin (NEURONTIN) 300 MG capsule TAKE 1 CAPSULE BY MOUTH THREE TIMES DAILY   . glipiZIDE (GLUCOTROL XL) 5 MG 24 hr tablet TAKE 2 TABLETS BY MOUTH EVERY MORNING AND 1 TABLET EVERY EVENING   . glucose blood (ACCU-CHEK AVIVA PLUS) test strip Check blood sugar bid. Dx E11.40   . ipratropium-albuterol (DUONEB) 0.5-2.5 (3) MG/3ML SOLN USE 1 VIAL IN NEBULIZER EVERY 6 HOURS   . isosorbide mononitrate (IMDUR) 60 MG 24 hr tablet Take 1 tablet (60 mg total) by mouth every morning.   Marland Kitchen JANUVIA 100 MG tablet TAKE 1 TABLET BY MOUTH EVERY DAY   . levothyroxine (SYNTHROID, LEVOTHROID) 125 MCG tablet Take 1 tablet (125 mcg total) by mouth  daily.   . metFORMIN (GLUCOPHAGE) 1000 MG tablet TAKE 1 TABLET BY MOUTH TWICE DAILY WITH A MEAL   . metoprolol tartrate (LOPRESSOR) 25 MG tablet TAKE 1 TABLET BY MOUTH TWICE DAILY   . misoprostol (CYTOTEC) 200 MCG tablet TAKE 1 TABLET(200 MCG) BY MOUTH FOUR TIMES DAILY   . montelukast (SINGULAIR) 10 MG tablet TAKE 1 TABLET BY MOUTH EVERY NIGHT AT BEDTIME   . naproxen (NAPROSYN) 500 MG tablet TAKE 1 TABLET(500 MG) BY MOUTH TWICE DAILY WITH A MEAL   . nitroGLYCERIN (NITROLINGUAL) 0.4 MG/SPRAY spray USE AS DIRECTED FOR CHEST PAIN   . Omega-3 Fatty Acids (FISH OIL) 1000 MG CAPS Take by mouth daily.     Marland Kitchen omeprazole (PRILOSEC) 20 MG capsule TAKE 1 CAPSULE(20 MG) BY MOUTH TWICE DAILY BEFORE A MEAL   . potassium chloride SA (K-DUR,KLOR-CON) 20 MEQ tablet TAKE 1 TABLET BY MOUTH EVERY DAY   . QVAR 40 MCG/ACT inhaler INHALE 2 PUFFS BY MOUTH TWICE DAILY   . raloxifene (EVISTA) 60 MG tablet Take 1 tablet (60 mg total) by mouth daily.   Marland Kitchen VASCEPA 1 g CAPS TAKE 4 CAPSULES BY MOUTH EVERY DAY   . INVOKANA 100 MG TABS tablet TAKE 1 TABLET(100 MG) BY MOUTH DAILY BEFORE BREAKFAST   . niacin (NIASPAN) 1000 MG CR tablet Take 2 tablets (2,000 mg total) by  mouth at bedtime.              No facility-administered encounter medications on file as of 12/01/2015.     Known diabetic complications: retinopathy and peripheral neuropathy Cardiovascular risk factors: advanced age (older than 38 for men, 28 for women), diabetes mellitus, dyslipidemia, hypertension and sedentary lifestyle Current diabetic medications include: glipizide XL 5mg  - 2 tablet qam and 1 tablet qpm; metformin 1000mg  1 tablet qd with food; Invokana 100mg  qd; Januvia 100mg  qd.   Eye exam current (within one year): yes Weight trend: stable Prior visit with CDE: no Current diet: patient is following low CHO diet most of the time. She is limiting sugar containing foods.  Current exercise: none Medication Compliance?  Yes  Current monitoring regimen:  home blood tests - qam Home blood sugar records: fasting range: 90 to 167 Any episodes of hypoglycemia? yes   Is She on ACE inhibitor or angiotensin II receptor blocker?  No       The following portions of the patient's history were reviewed and updated as appropriate: allergies, current medications, past family history, past medical history, past social history, past surgical history and problem list.    Objective:    There were no vitals taken for this visit.  Lab Review Glucose (mg/dL)  Date Value  11/19/2015 107 (H)  08/14/2015 77  04/30/2015 125 (H)   Glucose, Bld (mg/dL)  Date Value  10/05/2012 145 (H)  09/05/2012 141 (H)  05/30/2012 145 (H)   CO2 (mmol/L)  Date Value  11/19/2015 23  08/14/2015 19  04/30/2015 22   BUN (mg/dL)  Date Value  11/19/2015 18  08/14/2015 14  04/30/2015 9   Creat (mg/dL)  Date Value  10/05/2012 0.96  09/05/2012 0.82  05/30/2012 0.94   Creatinine, Ser (mg/dL)  Date Value  11/19/2015 1.27 (H)  08/14/2015 1.12 (H)  04/30/2015 0.93   A1c = 6.2% (11/19/2015)    Assessment:    Diabetes Mellitus type II, under adequate control.   Dizziness - possible hypotension and /or hypoglycemia Medication Mangement   Plan:    1.  Rx changes:     Stop Invokana - could cause both hypoglycemia and hypotension  Stop Niacin - not good evidience to support improved CV outcomes and can cause problems with increase BG and GI irritation.  Depending on lipids and A1c in future, other meds to consider stopping - glipizide (due to increase hypoglycemia in elderly and fall risk) and also Lovaza / fish oil - since last check TG were 50) 2.  Education: Reviewed 'ABCs' of diabetes management (respective goals in parentheses):  A1C (<7), blood pressure (<130/80), and cholesterol (LDL <100). 3. Rx for glucometer and testing supplies given - patient advised that Medicare should pay for meter and testing supplies but she might have to switch to meter that  is preferred by her insurance.  4. CHO counting diet discussed.  Reviewed CHO amount in various foods and how to read nutrition labels.  Discussed recommended serving sizes.  5.  Recommend check BG 2  times a day 6.  Discussed hypoglycemia s/s and how to treat.  Patient is advised to check BG when she feels dizzy or has other s/s of hypoglycemia. 7. Rx written by Pricilla Handler, NP for BP monitor and rolling walker with chair 8.  Follow up: 1 months with PCP and follow up with me in 3 months.

## 2015-12-02 ENCOUNTER — Telehealth: Payer: Self-pay | Admitting: Family

## 2015-12-02 DIAGNOSIS — R69 Illness, unspecified: Secondary | ICD-10-CM | POA: Diagnosis not present

## 2015-12-03 NOTE — Telephone Encounter (Signed)
Refilled called to [facility]

## 2015-12-06 DIAGNOSIS — E162 Hypoglycemia, unspecified: Secondary | ICD-10-CM | POA: Diagnosis not present

## 2015-12-08 NOTE — Telephone Encounter (Signed)
Notes faxed 12/07/15

## 2015-12-10 ENCOUNTER — Telehealth: Payer: Self-pay | Admitting: Family

## 2015-12-10 NOTE — Telephone Encounter (Signed)
Office note 12-01-15 faxed to [facility] @ [phone].

## 2015-12-11 DIAGNOSIS — M179 Osteoarthritis of knee, unspecified: Secondary | ICD-10-CM | POA: Diagnosis not present

## 2015-12-11 DIAGNOSIS — G629 Polyneuropathy, unspecified: Secondary | ICD-10-CM | POA: Diagnosis not present

## 2015-12-13 ENCOUNTER — Other Ambulatory Visit: Payer: Self-pay | Admitting: Family

## 2015-12-16 ENCOUNTER — Other Ambulatory Visit: Payer: Self-pay

## 2015-12-16 DIAGNOSIS — E1149 Type 2 diabetes mellitus with other diabetic neurological complication: Secondary | ICD-10-CM

## 2015-12-16 MED ORDER — ACCU-CHEK FASTCLIX LANCETS MISC: 2 refills | Status: DC

## 2015-12-26 ENCOUNTER — Other Ambulatory Visit: Payer: Self-pay | Admitting: Family

## 2015-12-31 DIAGNOSIS — R69 Illness, unspecified: Secondary | ICD-10-CM | POA: Diagnosis not present

## 2016-01-01 ENCOUNTER — Other Ambulatory Visit: Payer: Self-pay | Admitting: Family

## 2016-01-01 DIAGNOSIS — F329 Major depressive disorder, single episode, unspecified: Secondary | ICD-10-CM

## 2016-01-01 DIAGNOSIS — F419 Anxiety disorder, unspecified: Secondary | ICD-10-CM

## 2016-01-01 DIAGNOSIS — F32A Depression, unspecified: Secondary | ICD-10-CM

## 2016-01-04 ENCOUNTER — Other Ambulatory Visit: Payer: Self-pay | Admitting: Family

## 2016-01-17 ENCOUNTER — Other Ambulatory Visit: Payer: Self-pay | Admitting: Family

## 2016-01-18 ENCOUNTER — Other Ambulatory Visit: Payer: Self-pay | Admitting: Family

## 2016-01-19 ENCOUNTER — Other Ambulatory Visit: Payer: Self-pay | Admitting: Family

## 2016-01-19 DIAGNOSIS — R69 Illness, unspecified: Secondary | ICD-10-CM | POA: Diagnosis not present

## 2016-01-19 DIAGNOSIS — E1149 Type 2 diabetes mellitus with other diabetic neurological complication: Secondary | ICD-10-CM

## 2016-01-24 ENCOUNTER — Other Ambulatory Visit: Payer: Self-pay | Admitting: Family

## 2016-02-09 ENCOUNTER — Other Ambulatory Visit: Payer: Self-pay | Admitting: Family

## 2016-02-09 ENCOUNTER — Telehealth: Payer: Self-pay | Admitting: Family

## 2016-02-09 MED ORDER — MISOPROSTOL 200 MCG PO TABS: ORAL_TABLET | ORAL | 2 refills | Status: DC

## 2016-02-09 NOTE — Telephone Encounter (Signed)
Please review and advise.

## 2016-02-09 NOTE — Telephone Encounter (Signed)
Rx sent to pharmacy. PT needs to stop naprosyn if she has hx of GI bleed

## 2016-02-09 NOTE — Telephone Encounter (Signed)
Left message on VM that 90 day supply was sent to pharmacy & to stop taking naprosyn

## 2016-02-17 DIAGNOSIS — R69 Illness, unspecified: Secondary | ICD-10-CM | POA: Diagnosis not present

## 2016-02-18 ENCOUNTER — Encounter: Payer: Self-pay | Admitting: Family

## 2016-02-18 ENCOUNTER — Ambulatory Visit: Payer: Medicare HMO | Admitting: Family

## 2016-02-18 ENCOUNTER — Ambulatory Visit (INDEPENDENT_AMBULATORY_CARE_PROVIDER_SITE_OTHER): Payer: Medicare HMO | Admitting: Family

## 2016-02-18 VITALS — BP 122/77 | HR 77 | Temp 98.3°F | Ht 65.0 in | Wt 187.4 lb

## 2016-02-18 DIAGNOSIS — E039 Hypothyroidism, unspecified: Secondary | ICD-10-CM

## 2016-02-18 DIAGNOSIS — J209 Acute bronchitis, unspecified: Secondary | ICD-10-CM

## 2016-02-18 DIAGNOSIS — E1149 Type 2 diabetes mellitus with other diabetic neurological complication: Secondary | ICD-10-CM

## 2016-02-18 DIAGNOSIS — J449 Chronic obstructive pulmonary disease, unspecified: Secondary | ICD-10-CM | POA: Diagnosis not present

## 2016-02-18 DIAGNOSIS — I25119 Atherosclerotic heart disease of native coronary artery with unspecified angina pectoris: Secondary | ICD-10-CM | POA: Diagnosis not present

## 2016-02-18 DIAGNOSIS — Z0000 Encounter for general adult medical examination without abnormal findings: Secondary | ICD-10-CM | POA: Diagnosis not present

## 2016-02-18 DIAGNOSIS — K219 Gastro-esophageal reflux disease without esophagitis: Secondary | ICD-10-CM | POA: Diagnosis not present

## 2016-02-18 DIAGNOSIS — I10 Essential (primary) hypertension: Secondary | ICD-10-CM

## 2016-02-18 DIAGNOSIS — M8580 Other specified disorders of bone density and structure, unspecified site: Secondary | ICD-10-CM | POA: Diagnosis not present

## 2016-02-18 DIAGNOSIS — G6289 Other specified polyneuropathies: Secondary | ICD-10-CM | POA: Diagnosis not present

## 2016-02-18 DIAGNOSIS — E785 Hyperlipidemia, unspecified: Secondary | ICD-10-CM

## 2016-02-18 DIAGNOSIS — F419 Anxiety disorder, unspecified: Secondary | ICD-10-CM

## 2016-02-18 DIAGNOSIS — N3281 Overactive bladder: Secondary | ICD-10-CM

## 2016-02-18 DIAGNOSIS — F331 Major depressive disorder, recurrent, moderate: Secondary | ICD-10-CM

## 2016-02-18 DIAGNOSIS — J45909 Unspecified asthma, uncomplicated: Secondary | ICD-10-CM

## 2016-02-18 MED ORDER — AZITHROMYCIN 250 MG PO TABS: ORAL_TABLET | ORAL | 0 refills | Status: DC

## 2016-02-18 NOTE — Progress Notes (Signed)
Subjective:    Patient ID: Denise Gardner, female    DOB: Jan 07, 1946, 69 y.o.   MRN: [id number]  PT presents to the office today for chronic follow up and CPE. PT's mother passed away 11/02/15 so she has been dealing with the stress of this.  Diabetes  She presents for her follow-up diabetic visit. She has type 2 diabetes mellitus. Her disease course has been worsening. Hypoglycemia symptoms include nervousness/anxiousness. Pertinent negatives for hypoglycemia include no confusion, headaches, mood changes or sleepiness. Associated symptoms include foot paresthesias and visual change. Pertinent negatives for diabetes include no foot ulcerations. Pertinent negatives for hypoglycemia complications include no blackouts and no hospitalization. Symptoms are worsening. Diabetic complications include heart disease and peripheral neuropathy. Pertinent negatives for diabetic complications include no CVA or nephropathy. Risk factors for coronary artery disease include diabetes mellitus, dyslipidemia, family history, hypertension, obesity, sedentary lifestyle and post-menopausal. Current diabetic treatment includes oral agent (triple therapy). She is compliant with treatment all of the time. She is following a generally unhealthy diet. Her breakfast blood glucose range is generally 130-140 mg/dl. An ACE inhibitor/angiotensin II receptor blocker is not being taken. Eye exam is current.  Hypertension  This is a chronic problem. The current episode started more than 1 year ago. The problem has been resolved since onset. The problem is controlled. Associated symptoms include anxiety, peripheral edema ("at times") and shortness of breath. Pertinent negatives include no headaches or palpitations. Risk factors for coronary artery disease include diabetes mellitus, dyslipidemia, family history, obesity, post-menopausal state and sedentary lifestyle. Past treatments include beta blockers. Hypertensive end-organ damage includes  heart failure. There is no history of kidney disease, CAD/MI, CVA or a thyroid problem. There is no history of sleep apnea.  Hyperlipidemia  This is a chronic problem. The current episode started more than 1 year ago. The problem is uncontrolled. Recent lipid tests were reviewed and are high. Exacerbating diseases include diabetes, hypothyroidism and obesity. Associated symptoms include shortness of breath. Current antihyperlipidemic treatment includes herbal therapy and nicotinic acid. The current treatment provides moderate improvement of lipids. Risk factors for coronary artery disease include diabetes mellitus, dyslipidemia, family history, hypertension, obesity, a sedentary lifestyle and post-menopausal.  Anxiety  Presents for follow-up visit. Onset was 1 to 6 months ago. The problem has been waxing and waning. Symptoms include excessive worry, nervous/anxious behavior and shortness of breath. Patient reports no confusion, insomnia, irritability or palpitations. Symptoms occur occasionally.   Her past medical history is significant for anxiety/panic attacks, asthma and depression. Past treatments include nothing. The treatment provided mild relief. Compliance with prior treatments has been good.  Gastroesophageal Reflux  She complains of heartburn and a hoarse voice. She reports no belching, no coughing, no sore throat or no wheezing. This is a chronic problem. The current episode started more than 1 year ago. The problem occurs occasionally. The problem has been resolved. The symptoms are aggravated by certain foods. She has tried a PPI and a diet change for the symptoms. The treatment provided significant relief.  Asthma  She complains of frequent throat clearing, hoarse voice and shortness of breath. There is no cough or wheezing. This is a chronic problem. The current episode started more than 1 year ago. The problem occurs intermittently. The problem has been waxing and waning. Associated  symptoms include heartburn. Pertinent negatives include no headaches or sore throat. Her symptoms are aggravated by change in weather. Her symptoms are alleviated by rest and steroid inhaler. She reports moderate improvement on treatment.  Her past medical history is significant for asthma and COPD.  Thyroid Problem  Presents for follow-up visit. Symptoms include anxiety, constipation, dry skin, hoarse voice and visual change. Patient reports no diarrhea or palpitations. The symptoms have been stable. Past treatments include levothyroxine. The treatment provided significant relief. Her past medical history is significant for diabetes, heart failure and hyperlipidemia.  Peripheral Neuropathy Pt currently taking gabapentin 350m TID. Pt states this seems to be helping with the "burning in her feet".  Osteopenia PT currently taking Evista 60 mg daily. Last Bone Density Scan 10/23/14. OAB PT states she continues to have frequency tries to limit sodas.  COPD PT uses albuterol as needed. Currently does not smoke.  Review of Systems  Constitutional: Negative.  Negative for irritability.  HENT: Positive for hoarse voice. Negative for sore throat.   Eyes: Negative.   Respiratory: Positive for shortness of breath. Negative for cough and wheezing.   Cardiovascular: Negative.  Negative for palpitations.  Gastrointestinal: Positive for constipation and heartburn. Negative for diarrhea.  Endocrine: Negative.   Musculoskeletal: Negative.   Neurological: Negative.  Negative for headaches.  Hematological: Negative.   Psychiatric/Behavioral: Negative for confusion. The patient is nervous/anxious. The patient does not have insomnia.   All other systems reviewed and are negative.      Objective:   Physical Exam  Constitutional: She is oriented to person, place, and time. She appears well-developed and well-nourished. No distress.  HENT:  Head: Normocephalic and atraumatic.  Right Ear: External ear  normal.  Left Ear: External ear normal.  Nose: Nose normal.  Mouth/Throat: Oropharynx is clear and moist.  Eyes: Pupils are equal, round, and reactive to light.  Neck: Normal range of motion. Neck supple. No thyromegaly present.  Cardiovascular: Normal rate, regular rhythm, normal heart sounds and intact distal pulses.   No murmur heard. Pulmonary/Chest: Effort normal. No respiratory distress. She has wheezes.  Coarse nonproductive cough  Abdominal: Soft. Bowel sounds are normal. She exhibits no distension. There is no tenderness.  Musculoskeletal: Normal range of motion. She exhibits edema (trace in BLE). She exhibits no tenderness.  Pt using a cane to walk with  Neurological: She is alert and oriented to person, place, and time.  Skin: Skin is warm and dry.  Psychiatric: She has a normal mood and affect. Her behavior is normal. Judgment and thought content normal.  Vitals reviewed.   BP 122/77   Pulse 77   Temp 98.3 F (36.8 C) (Oral)   Ht '5\' 5"'  (1.651 m)   Wt 187 lb 6.4 oz (85 kg)   BMI 31.18 kg/m        Assessment & Plan:  1. Coronary artery disease involving native coronary artery of native heart with angina pectoris (HCC) - CMP14+EGFR - Lipid panel - CBC with Differential/Platelet  2. Essential hypertension - CMP14+EGFR - CBC with Differential/Platelet  3. Uncomplicated asthma, unspecified asthma severity, unspecified whether persistent - CMP14+EGFR - CBC with Differential/Platelet  4. Chronic obstructive pulmonary disease, unspecified COPD type ([place]  - CMP14+EGFR - CBC with Differential/Platelet  5. Gastroesophageal reflux disease, esophagitis presence not specified - CMP14+EGFR - CBC with Differential/Platelet  6. Diabetes mellitus type 2 with neurological manifestations ([place] - CMP14+EGFR - CBC with Differential/Platelet  7. Hypothyroidism, unspecified type  - CMP14+EGFR - Thyroid Panel With TSH - CBC with Differential/Platelet  8. Other  [place]neuropathy ([place] - CMP14+EGFR - CBC with Differential/Platelet  9. Osteopenia, unspecified location  - CMP14+EGFR - CBC with Differential/Platelet  10. OAB (overactive bladder) -  CMP14+EGFR - CBC with Differential/Platelet  11. Hyperlipidemia, unspecified hyperlipidemia type - CMP14+EGFR - Lipid panel - CBC with Differential/Platelet  12. Anxiety  - CMP14+EGFR - CBC with Differential/Platelet  13. Moderate episode of recurrent major depressive disorder (HCC) - CMP14+EGFR - CBC with Differential/Platelet  14. Annual physical exam - CMP14+EGFR - Lipid panel - Thyroid Panel With TSH - CBC with Differential/Platelet - VITAMIN D 25 Hydroxy (Vit-D Deficiency, Fractures)  15. Acute bronchitis, unspecified organism - Take meds as prescribed - Use a cool mist humidifier  -Use saline nose sprays frequently -Saline irrigations of the nose can be very helpful if done frequently.  * 4X daily for 1 week*  * Use of a nettie pot can be helpful with this. Follow directions with this* -Force fluids -For any cough or congestion  Use plain Mucinex- regular strength or max strength is fine   * Children- consult with Pharmacist for dosing -For fever or aces or pains- take tylenol or ibuprofen appropriate for age and weight.  * for fevers greater than 101 orally you may alternate ibuprofen and tylenol every  3 hours. -Throat lozenges if help -New toothbrush in 3 days - azithromycin (ZITHROMAX) 250 MG tablet; Take 500 mg once, then 250 mg for four days  Dispense: 6 tablet; Refill: 0   Continue all meds Labs pending Health Maintenance reviewed Diet and exercise encouraged RTO 3 months  Evelina Dun, FNP

## 2016-02-18 NOTE — Patient Instructions (Signed)

## 2016-02-19 ENCOUNTER — Other Ambulatory Visit: Payer: Self-pay | Admitting: Family

## 2016-02-19 LAB — CBC WITH DIFFERENTIAL/PLATELET
Basophils Absolute: 0.1 10*3/uL (ref 0.0–0.2)
Basos: 1 %
EOS (ABSOLUTE): 0.4 10*3/uL (ref 0.0–0.4)
Eos: 6 %
Hematocrit: 32.9 % — ABNORMAL LOW (ref 34.0–46.6)
Hemoglobin: 10.7 g/dL — ABNORMAL LOW (ref 11.1–15.9)
Immature Grans (Abs): 0 10*3/uL (ref 0.0–0.1)
Immature Granulocytes: 0 %
Lymphocytes Absolute: 2.1 10*3/uL (ref 0.7–3.1)
Lymphs: 34 %
MCH: 26.8 pg (ref 26.6–33.0)
MCHC: 32.5 g/dL (ref 31.5–35.7)
MCV: 82 fL (ref 79–97)
Monocytes Absolute: 0.7 10*3/uL (ref 0.1–0.9)
Monocytes: 12 %
Neutrophils Absolute: 2.9 10*3/uL (ref 1.4–7.0)
Neutrophils: 47 %
Platelets: 265 10*3/uL (ref 150–379)
RBC: 4 x10E6/uL (ref 3.77–5.28)
RDW: 15.8 % — ABNORMAL HIGH (ref 12.3–15.4)
WBC: 6.1 10*3/uL (ref 3.4–10.8)

## 2016-02-19 LAB — CMP14+EGFR
ALT: 15 IU/L (ref 0–32)
AST: 16 IU/L (ref 0–40)
Albumin/Globulin Ratio: 1.2 (ref 1.2–2.2)
Albumin: 3.8 g/dL (ref 3.5–4.8)
Alkaline Phosphatase: 49 IU/L (ref 39–117)
BUN/Creatinine Ratio: 13 (ref 12–28)
BUN: 12 mg/dL (ref 8–27)
Bilirubin Total: 0.2 mg/dL (ref 0.0–1.2)
CO2: 25 mmol/L (ref 18–29)
Calcium: 10 mg/dL (ref 8.7–10.3)
Chloride: 97 mmol/L (ref 96–106)
Creatinine, Ser: 0.96 mg/dL (ref 0.57–1.00)
GFR calc Af Amer: 68 mL/min/{1.73_m2} (ref >59)
GFR calc non Af Amer: 59 mL/min/{1.73_m2} — ABNORMAL LOW (ref >59)
Globulin, Total: 3.2 g/dL (ref 1.5–4.5)
Glucose: 71 mg/dL (ref 65–99)
Potassium: 4.5 mmol/L (ref 3.5–5.2)
Sodium: 140 mmol/L (ref 134–144)
Total Protein: 7 g/dL (ref 6.0–8.5)

## 2016-02-19 LAB — VITAMIN D 25 HYDROXY (VIT D DEFICIENCY, FRACTURES): Vit D, 25-Hydroxy: 50.2 ng/mL (ref 30.0–100.0)

## 2016-02-19 LAB — LIPID PANEL
Chol/HDL Ratio: 2.9 ratio units (ref 0.0–4.4)
Cholesterol, Total: 171 mg/dL (ref 100–199)
HDL: 59 mg/dL (ref >39)
LDL Calculated: 94 mg/dL (ref 0–99)
Triglycerides: 90 mg/dL (ref 0–149)
VLDL Cholesterol Cal: 18 mg/dL (ref 5–40)

## 2016-02-19 LAB — THYROID PANEL WITH TSH
Free Thyroxine Index: 3.2 (ref 1.2–4.9)
T3 Uptake Ratio: 30 % (ref 24–39)
T4, Total: 10.5 ug/dL (ref 4.5–12.0)
TSH: 0.17 u[IU]/mL — ABNORMAL LOW (ref 0.450–4.500)

## 2016-02-19 MED ORDER — LEVOTHYROXINE SODIUM 112 MCG PO TABS: 112.0000 ug | ORAL_TABLET | Freq: Every day | ORAL | 1 refills | Status: DC

## 2016-02-20 ENCOUNTER — Other Ambulatory Visit: Payer: Self-pay | Admitting: Family

## 2016-02-20 DIAGNOSIS — J309 Allergic rhinitis, unspecified: Secondary | ICD-10-CM

## 2016-02-20 DIAGNOSIS — J069 Acute upper respiratory infection, unspecified: Secondary | ICD-10-CM

## 2016-03-07 ENCOUNTER — Other Ambulatory Visit: Payer: Self-pay | Admitting: Family

## 2016-03-08 ENCOUNTER — Other Ambulatory Visit: Payer: Self-pay | Admitting: Family

## 2016-03-14 ENCOUNTER — Other Ambulatory Visit: Payer: Self-pay | Admitting: Family

## 2016-03-30 ENCOUNTER — Encounter: Payer: Medicare HMO | Admitting: *Deleted

## 2016-03-30 ENCOUNTER — Ambulatory Visit (INDEPENDENT_AMBULATORY_CARE_PROVIDER_SITE_OTHER): Payer: Medicare HMO | Admitting: Pharmacist

## 2016-03-30 ENCOUNTER — Encounter: Payer: Self-pay | Admitting: Pharmacist

## 2016-03-30 VITALS — BP 120/72 | HR 68 | Ht 65.0 in | Wt 187.0 lb

## 2016-03-30 DIAGNOSIS — E11649 Type 2 diabetes mellitus with hypoglycemia without coma: Secondary | ICD-10-CM | POA: Diagnosis not present

## 2016-03-30 DIAGNOSIS — Z1231 Encounter for screening mammogram for malignant neoplasm of breast: Secondary | ICD-10-CM | POA: Diagnosis not present

## 2016-03-30 DIAGNOSIS — Z23 Encounter for immunization: Secondary | ICD-10-CM

## 2016-03-30 LAB — BAYER DCA HB A1C WAIVED: HB A1C (BAYER DCA - WAIVED): 5.4 % (ref <7.0)

## 2016-03-30 LAB — GLUCOSE HEMOCUE WAIVED: Glu Hemocue Waived: 176 mg/dL — ABNORMAL HIGH (ref 65–99)

## 2016-03-30 MED ORDER — GLIPIZIDE ER 5 MG PO TB24: 5.0000 mg | ORAL_TABLET | Freq: Every day | ORAL | 1 refills | Status: DC

## 2016-03-30 NOTE — Patient Instructions (Signed)
Decrease glipizide XR 52m to take 1 tablet with breakfast.   Hypoglycemia Hypoglycemia occurs when the level of sugar (glucose) in the blood is too low. Glucose is a type of sugar that provides the body's main source of energy. Certain hormones (insulin and glucagon) control the level of glucose in the blood. Insulin lowers blood glucose, and glucagon increases blood glucose. Hypoglycemia can result from having too much insulin in the bloodstream, or from not eating enough food that contains glucose. Hypoglycemia can happen in people who do or do not have diabetes. It can develop quickly, and it can be a medical emergency. What are the causes? Hypoglycemia occurs most often in people who have diabetes. If you have diabetes, hypoglycemia may be caused by:  Diabetes medicine. More likely to occur with insulin.  Not eating enough, or not eating often enough.  Increased physical activity.  Drinking alcohol, especially when you have not eaten recently. What increases the risk? Hypoglycemia is more likely to develop in:  People who have diabetes and take medicines to lower blood glucose.  People who abuse alcohol.  People who have a severe illness. What are the signs or symptoms? Hypoglycemia may not cause any symptoms. If you have symptoms, they may include:  Hunger.  Anxiety.  Sweating and feeling clammy.  Confusion.  Dizziness or feeling light-headed.  Sleepiness.  Nausea.  Increased heart rate.  Headache.  Blurry vision.  Seizure.  Nightmares.  Tingling or numbness around the mouth, lips, or tongue.  A change in speech.  Decreased ability to concentrate.  A change in coordination.  Restless sleep.  Tremors or shakes.  Fainting.  Irritability. How is this treated Low Blood glucose (Blood glucose less than 70)? This condition can often be treated by immediately eating or drinking something that contains glucose / sugar, such as:  3-4 sugar tablets  (glucose pills).  Glucose gel, 15-gram tube.  Fruit juice, 4 oz (120 mL).  Regular soda (not diet soda), 4 oz (120 mL).  Low-fat milk, 4 oz (120 mL).  Several pieces of hard candy.  Sugar or honey, 1 Tbsp. Treating Hypoglycemia If You Have Diabetes  If you are alert and able to swallow safely, follow the 15:15 rule:  Take 15 grams of a rapid-acting carbohydrate.  Rapid-acting options include:  1 tube of glucose gel.  3 glucose pills.  6-8 pieces of hard candy.  4 oz (120 mL) of fruit juice.  4 oz (120 ml) of regular (not diet) soda.  Check your blood glucose 15 to 20 minutes after you take the carbohydrate.  If the repeat blood glucose level is still at or below 70 mg/dL (3.9 mmol/L), take 15 grams of a carbohydrate again.  If your blood glucose level does not increase above 70 mg/dL (3.9 mmol/L) after 3 tries, seek emergency medical care.  After your blood glucose level returns to normal, eat a meal or a snack within 1 hour. Treating Severe Hypoglycemia  Severe hypoglycemia is when your blood glucose level is at or below 54 mg/dL (3 mmol/L). Severe hypoglycemia is an emergency. Do not wait to see if the symptoms will go away. Get medical help right away. Call your local emergency services (911 in the [place] Do not drive yourself to the hospital. If you have severe hypoglycemia and you cannot eat or drink, you may need an injection of glucagon. A family member or close friend should learn how to check your blood glucose and how to give you a glucagon  injection. Ask your health care provider if you need to have an emergency glucagon injection kit available. Severe hypoglycemia may need to be treated in a hospital. The treatment may include getting glucose through an IV tube. You may also need treatment for the cause of your hypoglycemia. Follow these instructions at home: General instructions  Avoid any diets that cause you to not eat enough food. Talk with your health care  provider before you start any new diet.  Take over-the-counter and prescription medicines only as told by your health care provider.  Limit alcohol intake to no more than 1 drink per day for nonpregnant women and 2 drinks per day for men. One drink equals 12 oz of beer, 5 oz of wine, or 1 oz of hard liquor.  Keep all follow-up visits as told by your health care provider. This is important. If You Have Diabetes:   Make sure you know the symptoms of hypoglycemia.  Always have a rapid-acting carbohydrate snack with you to treat low blood sugar.  Follow your diabetes management plan, as told by your health care provider. Make sure you:  Take your medicines as directed.  Follow your exercise plan.  Follow your meal plan. Eat on time, and do not skip meals.  Check your blood glucose as often as directed. Make sure to check your blood glucose before and after exercise. If you exercise longer or in a different way than usual, check your blood glucose more often.  Follow your sick day plan whenever you cannot eat or drink normally. Make this plan in advance with your health care provider.  Share your diabetes management plan with people in your workplace, school, and household.  Check your urine for ketones when you are ill and as told by your health care provider.  Carry a medical alert card or wear medical alert jewelry. Contact a health care provider if:  You have problems keeping your blood glucose in your target range.  You have frequent episodes of hypoglycemia. (more than 1 episode per week) Get help right away if:  You continue to have hypoglycemia symptoms after eating or drinking something containing glucose.  Your blood glucose is at or below 54 mg/dL (3 mmol/L).  You have a seizure.  You faint. These symptoms may represent a serious problem that is an emergency. Do not wait to see if the symptoms will go away. Get medical help right away. Call your local emergency  services (911 in the [place] Do not drive yourself to the hospital.  This information is not intended to replace advice given to you by your health care provider. Make sure you discuss any questions you have with your health care provider.

## 2016-03-30 NOTE — Progress Notes (Signed)
Patient ID: Denise Gardner, female   DOB: Apr 19, 1945, 71 y.o.   MRN: [id number]    Subjective:    Denise Gardner is a 71 y.o. female who presents for a re-evaluation of Type 2 diabetes mellitus and medication review.    Denise Gardner continues to have concerns with dizziness and voices concern that she is taking too much medication.  At our last visit we discontinued both Invokana and Niaspan.   She reports that she has had 2 episodes of hypoglycemia which required EMS.  Once this occurred because she skipped supper and fell asleep.   Checks BG up to tid.  Reports HBG 30 to over 200   Known diabetic complications: retinopathy and peripheral neuropathy Cardiovascular risk factors: advanced age (older than 94 for men, 66 for women), diabetes mellitus, dyslipidemia, hypertension and sedentary lifestyle Current diabetic medications include: glipizide XL 5mg  - 2 tablet qam and 1 tablet qpm; metformin 1000mg  1 tablet qd with food;  Januvia 100mg  qd.   Eye exam current (within one year): yes Weight trend: stable Prior visit with CDE: no Current diet: patient is following low CHO diet most of the time thought she states that she has been eating more sweets to prevent hypoglycemia Current exercise: none Medication Compliance?  Yes  Is She on ACE inhibitor or angiotensin II receptor blocker?  No      The following portions of the patient's history were reviewed and updated as appropriate: allergies, current medications, past medical history and problem list.    Objective:    BP 120/72   Pulse 68   Ht 5\' 5"  (1.651 m)   Wt 187 lb (84.8 kg)   BMI 31.12 kg/m    A1c = 5.4% today Previous A1c = 6.2% (11/19/2015)  RBG was 176 in office today  Lab Review Glucose (mg/dL)  Date Value  02/18/2016 71  11/19/2015 107 (H)  08/14/2015 77   Glucose, Bld (mg/dL)  Date Value  10/05/2012 145 (H)  09/05/2012 141 (H)  05/30/2012 145 (H)   CO2 (mmol/L)  Date Value  02/18/2016 25  11/19/2015 23   08/14/2015 19   BUN (mg/dL)  Date Value  02/18/2016 12  11/19/2015 18  08/14/2015 14   Creat (mg/dL)  Date Value  10/05/2012 0.96  09/05/2012 0.82  05/30/2012 0.94   Creatinine, Ser (mg/dL)  Date Value  02/18/2016 0.96  11/19/2015 1.27 (H)  08/14/2015 1.12 (H)    Assessment:    Diabetes Mellitus type II, under adequate control.   Dizziness - possible hypoglycemia Medication Mangement   Plan:    1.  Rx changes:   Decrease glipizide XL to 5mg  take 1 tablet once daily only. 2.  Education: Reviewed 'ABCs' of diabetes management (respective goals in parentheses):  A1C (<7), blood pressure (<130/80), and cholesterol (LDL <100). 3. Reviewed BG goals with patient.  She is to call for medication adjustment if she continues to have low BG (less than 70) or if BG increases to over 250. Continue to check BG 2-3 times daily 4. Reviewed s/s of and treatment of hypoglycemia. 5.  CHO counting diet discussed.  Reviewed CHO amount in various foods and how to read nutrition labels.  Discussed recommended serving sizes.  5.  Influenza vaccines given in office today 6.  RTC in March to see PCP and to see me August 2018.

## 2016-04-01 DIAGNOSIS — R69 Illness, unspecified: Secondary | ICD-10-CM | POA: Diagnosis not present

## 2016-04-02 ENCOUNTER — Other Ambulatory Visit: Payer: Self-pay | Admitting: Family

## 2016-04-02 DIAGNOSIS — F329 Major depressive disorder, single episode, unspecified: Secondary | ICD-10-CM

## 2016-04-02 DIAGNOSIS — E1149 Type 2 diabetes mellitus with other diabetic neurological complication: Secondary | ICD-10-CM

## 2016-04-02 DIAGNOSIS — F419 Anxiety disorder, unspecified: Secondary | ICD-10-CM

## 2016-04-02 DIAGNOSIS — F32A Depression, unspecified: Secondary | ICD-10-CM

## 2016-04-06 IMAGING — CR DG RIBS W/ CHEST 3+V*R*
4 series · 4 of 4 positions shown · non-contrast
Comparison: None.

CLINICAL DATA: Status post fall 11/15/2014 with onset bilateral rib
pain. Initial encounter.

EXAM:
RIGHT RIBS AND CHEST - 3+ VIEW

[view not recorded (1 of 4)]
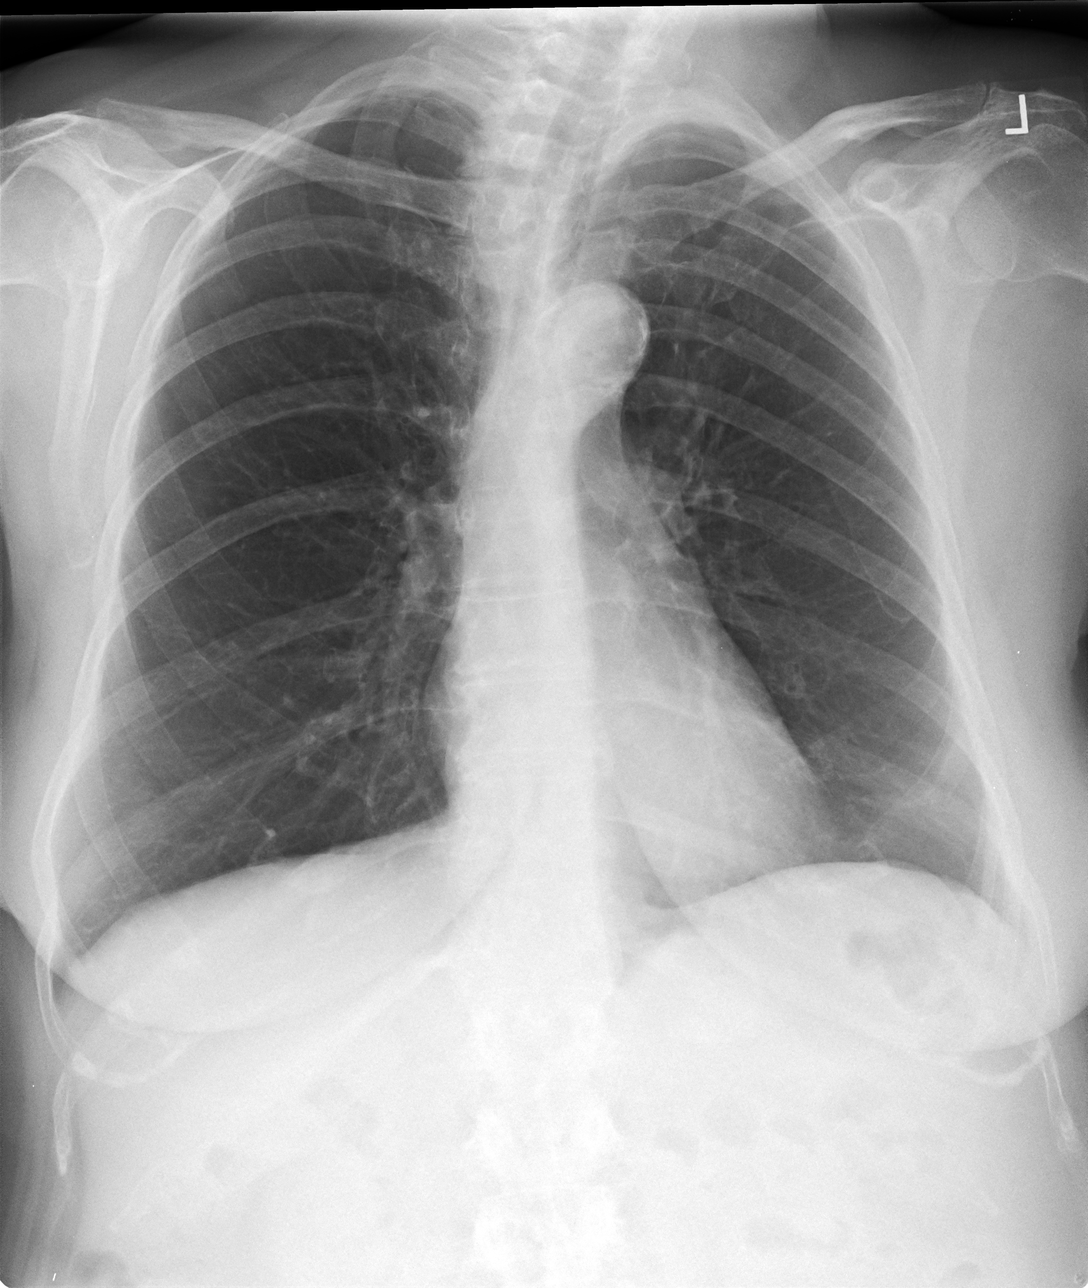

[view not recorded (2 of 4)]
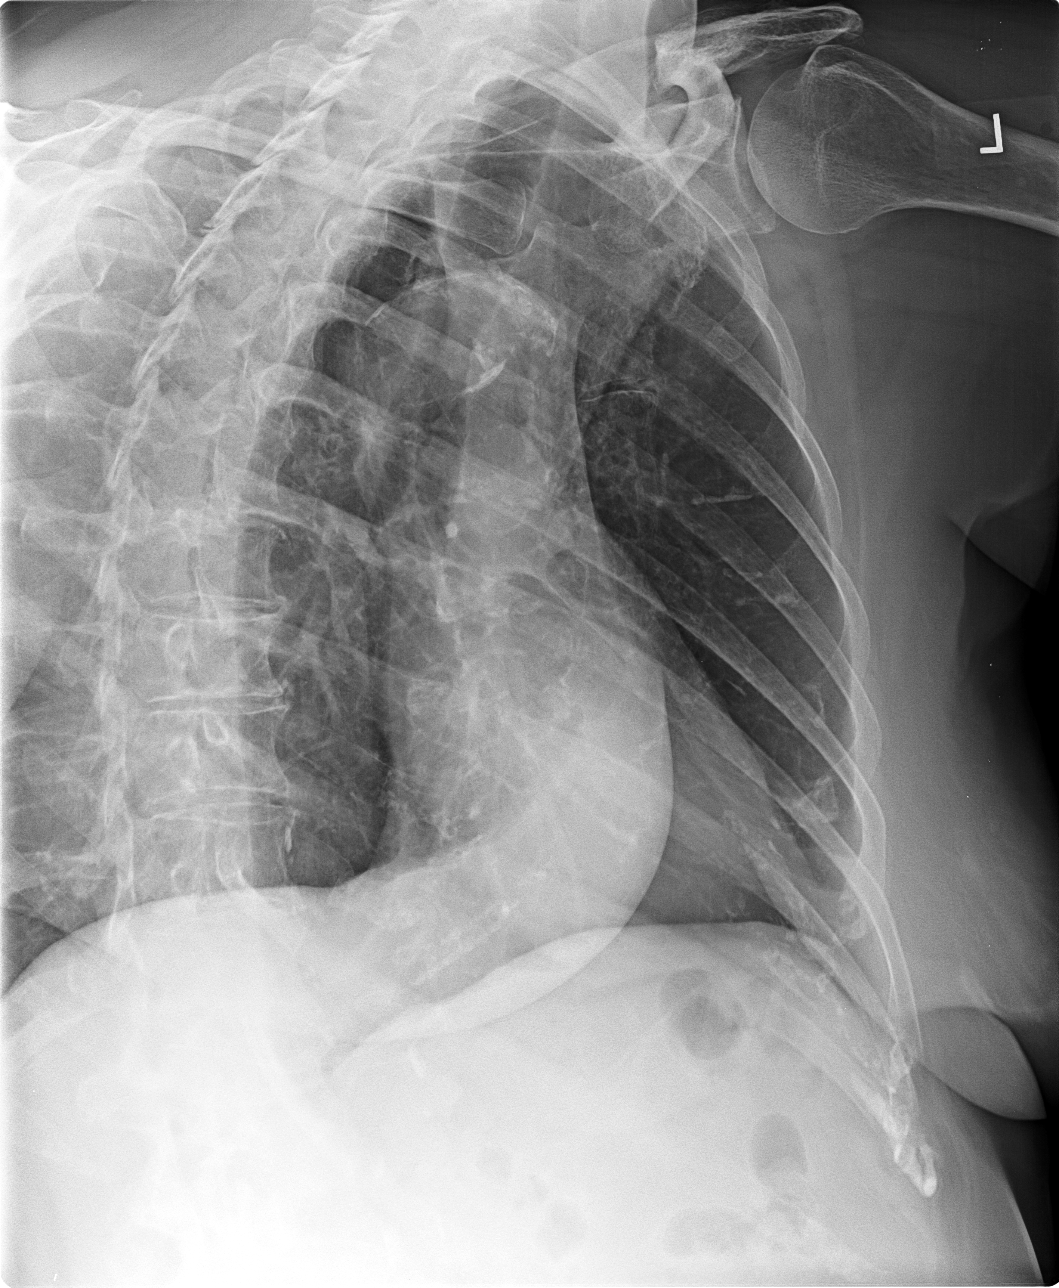

[view not recorded (3 of 4)]
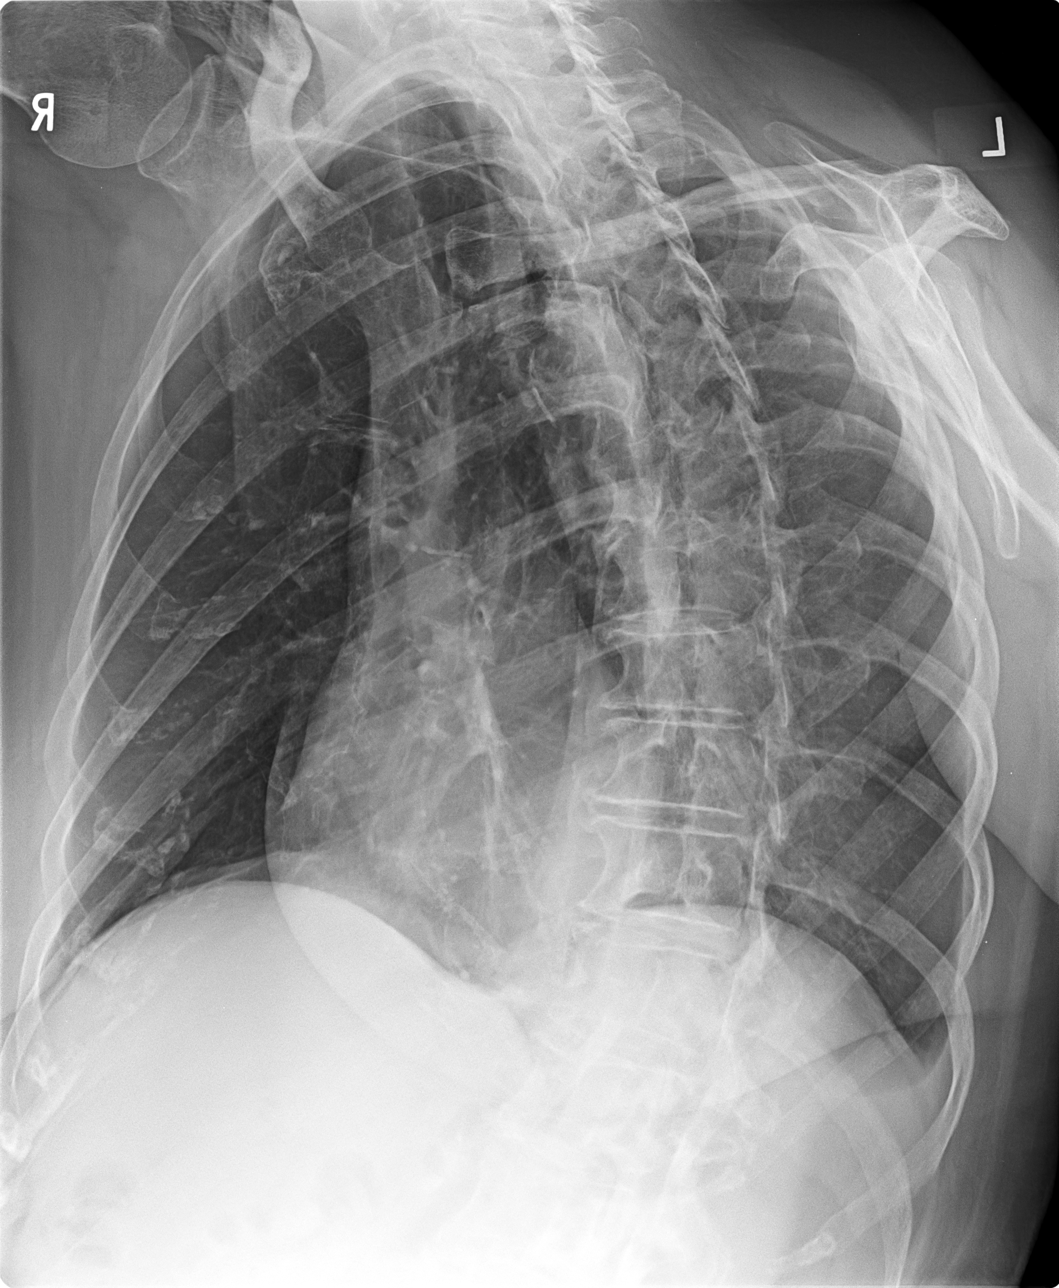

[view not recorded (4 of 4)]
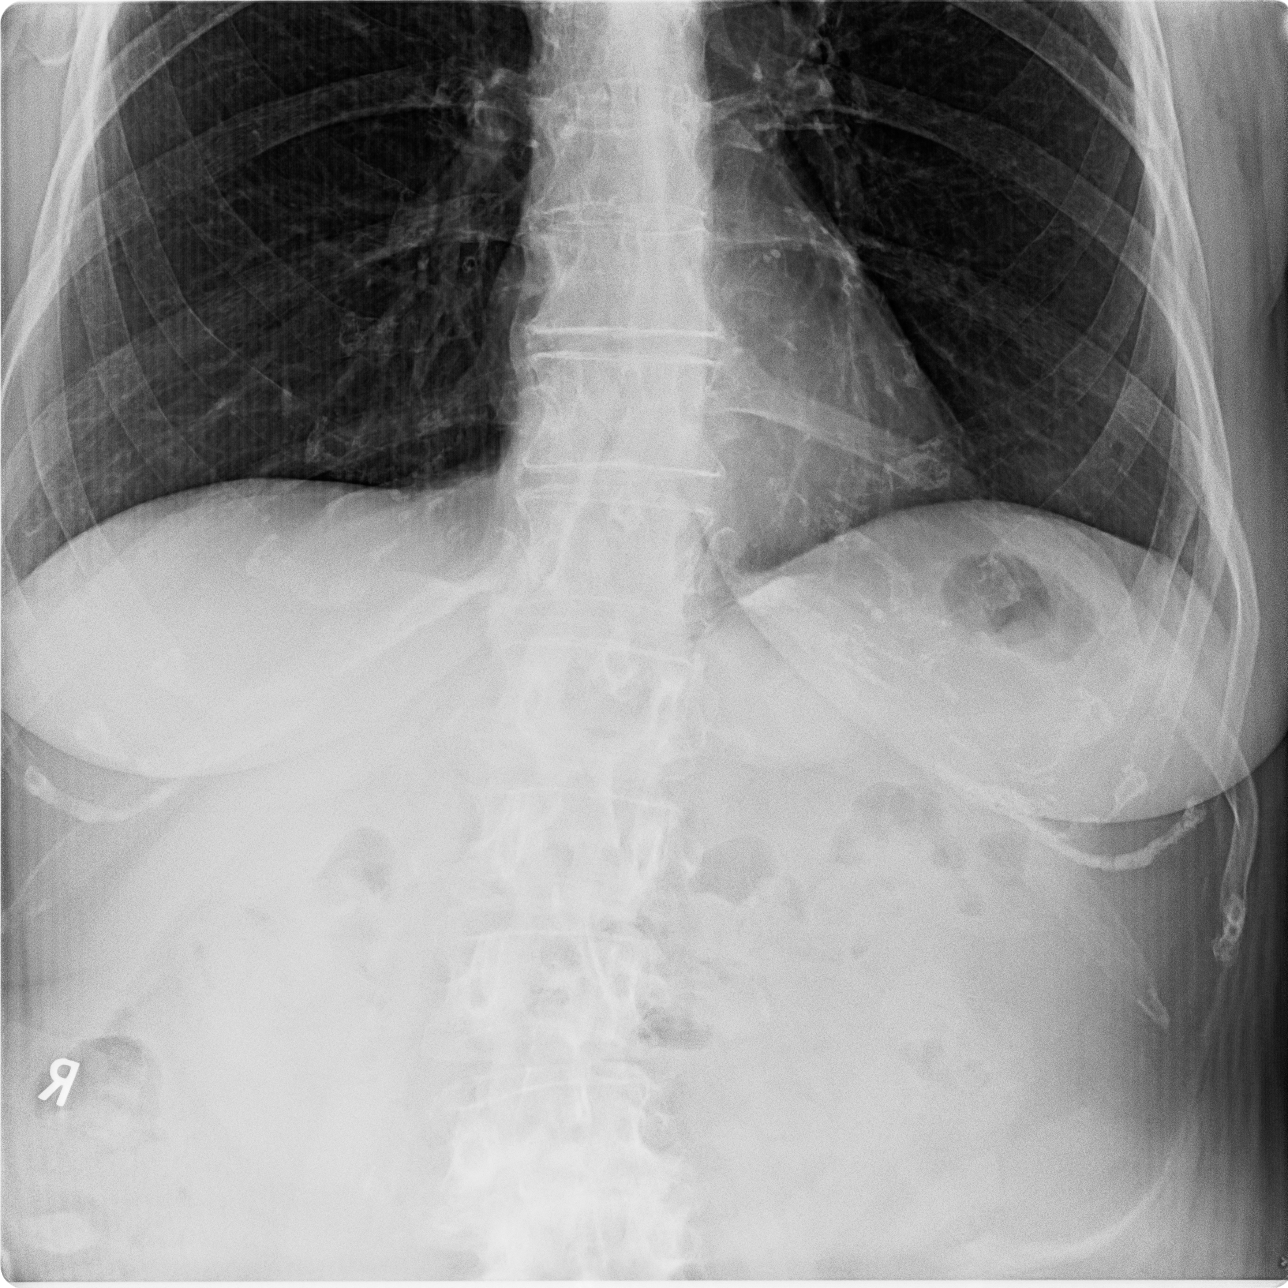

[4 of 4 positions shown; findings below may reference images not displayed]

FINDINGS: No fracture or other bone lesions are seen involving the ribs. There
is no evidence of pneumothorax or pleural effusion. Both lungs are
clear. Heart size and mediastinal contours are within normal limits.
IMPRESSION: Negative exam.

## 2016-04-06 IMAGING — CT CT HEAD W/O CM
1 series · 16 of 30 positions shown, 20 images · non-contrast
Comparison: None.

CLINICAL DATA: Headache and dizziness after a fall 2 days ago at
home.

EXAM:
CT HEAD WITHOUT CONTRAST
TECHNIQUE: Contiguous axial images were obtained from the base of the skull
through the vertex without intravenous contrast.

[Series 2: headtrauma 4.8 h37s · axial · 0.43mm/px · z∈[+99,+256]mm · 16 of 36 slices shown, 20 images]
[im 2/36  brain]
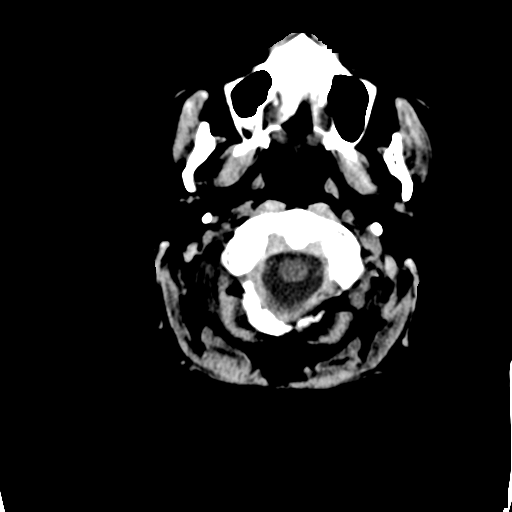
[im 2/36  bone]
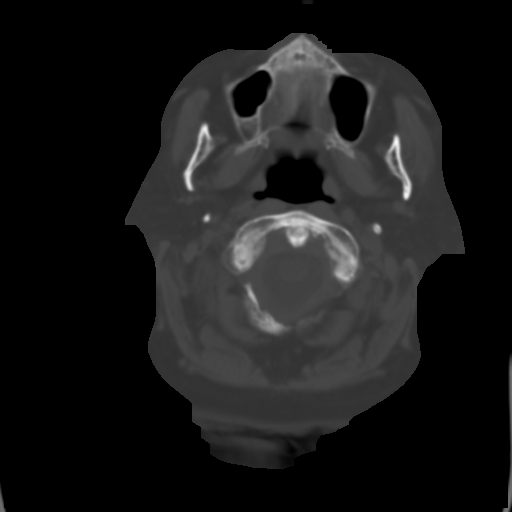
[im 4/36  brain]
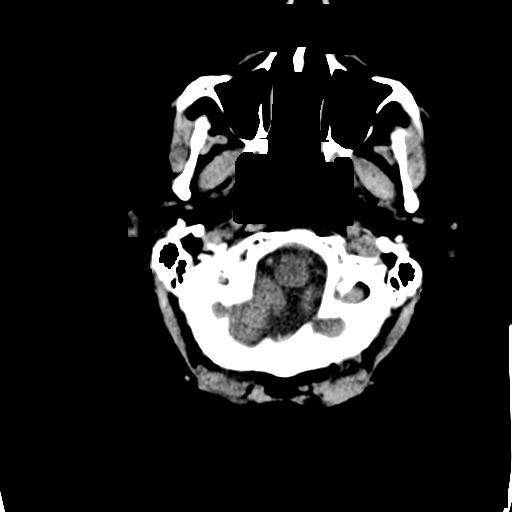
[im 7/36  brain]
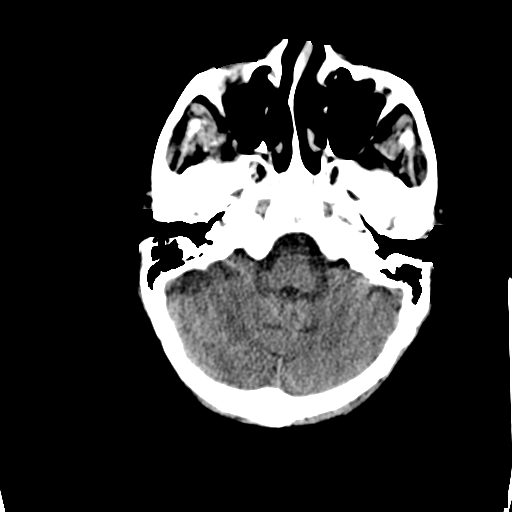
[im 9/36  brain]
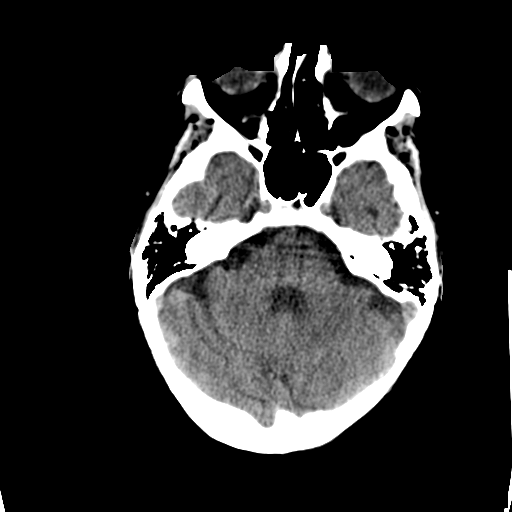
[im 10/36  brain]
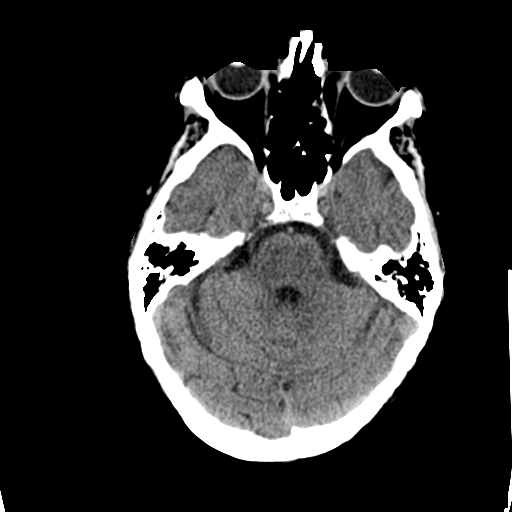
[im 10/36  bone]
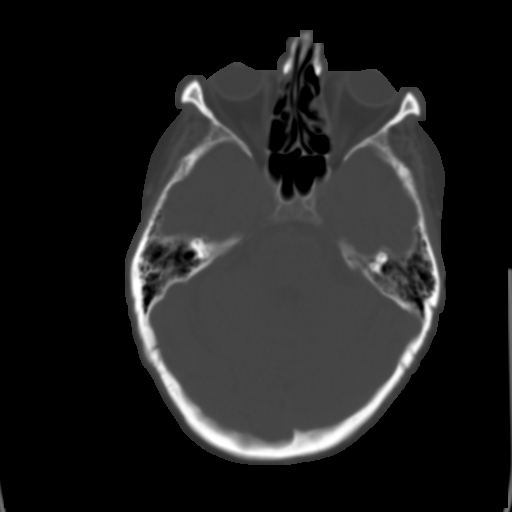
[im 13/36  brain]
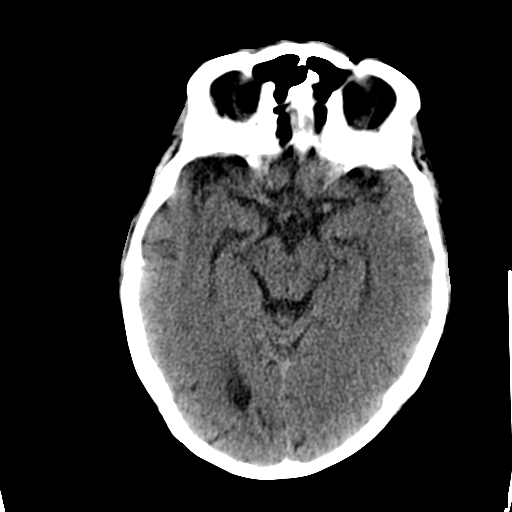
[im 15/36  brain]
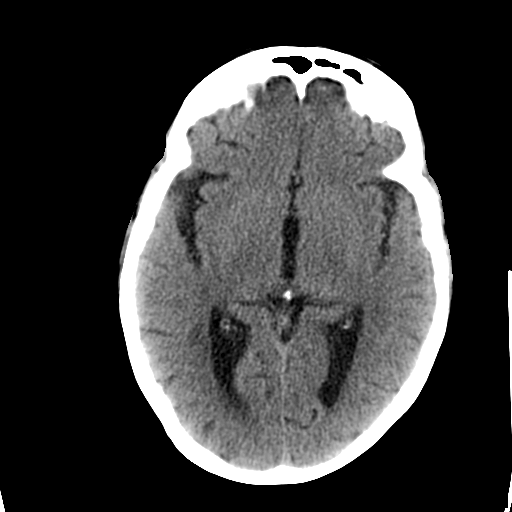
[im 17/36  brain]
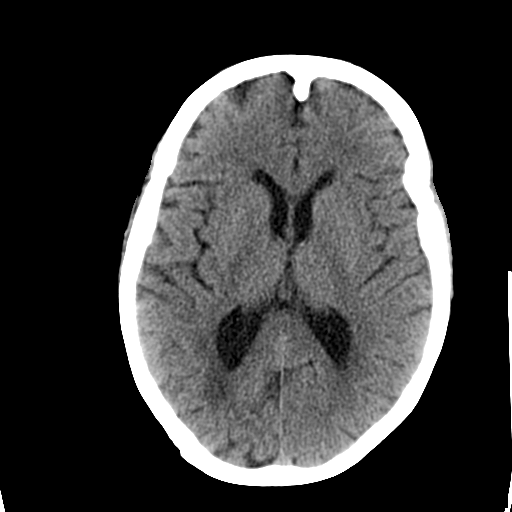
[im 19/36  brain]
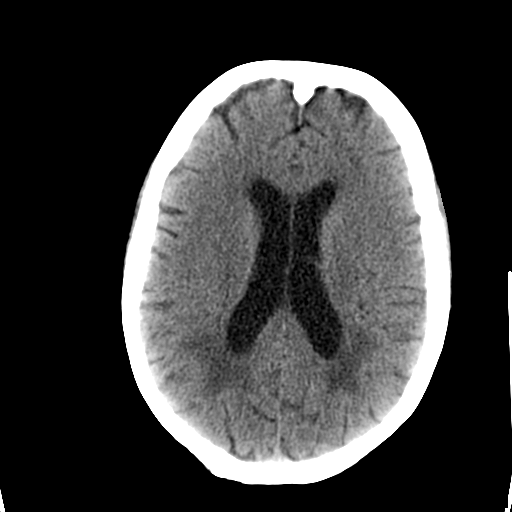
[im 19/36  bone]
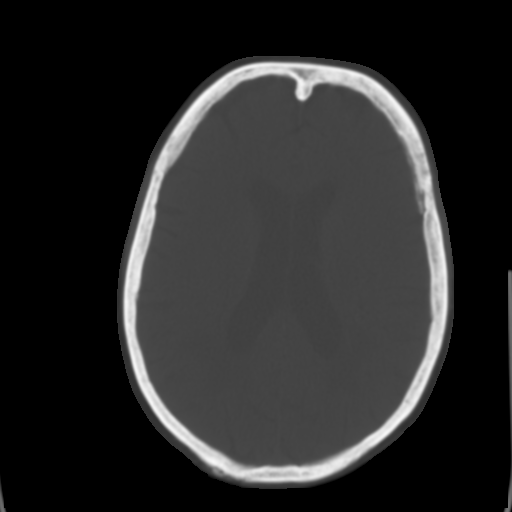
[im 21/36  brain]
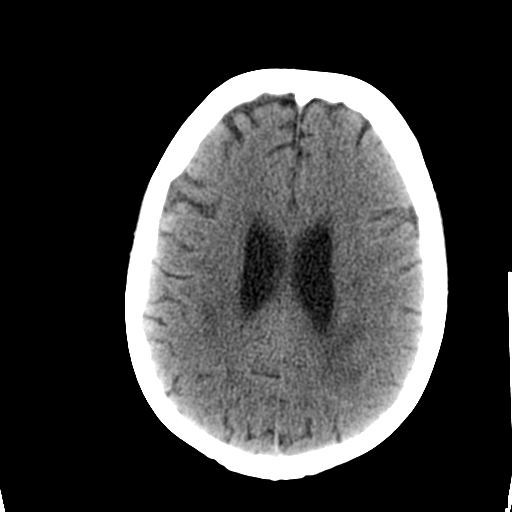
[im 23/36  brain]
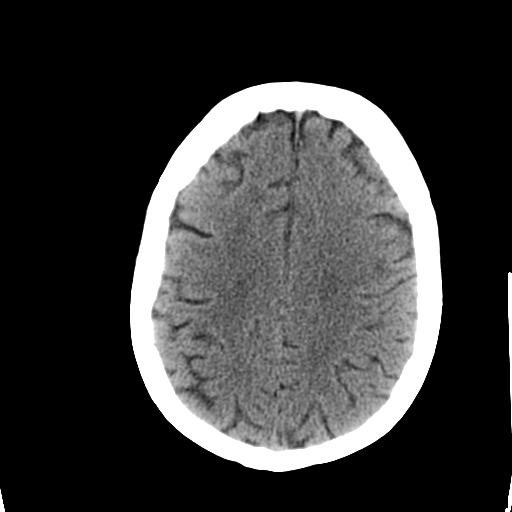
[im 26/36  brain]
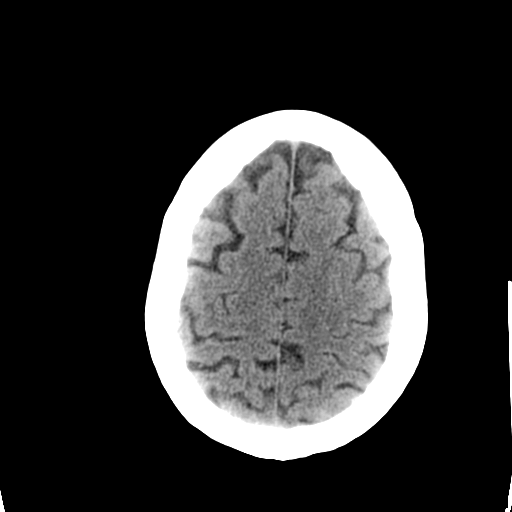
[im 27/36  brain]
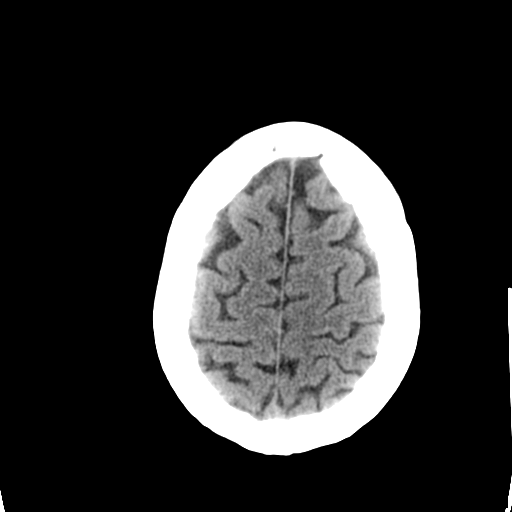
[im 27/36  bone]
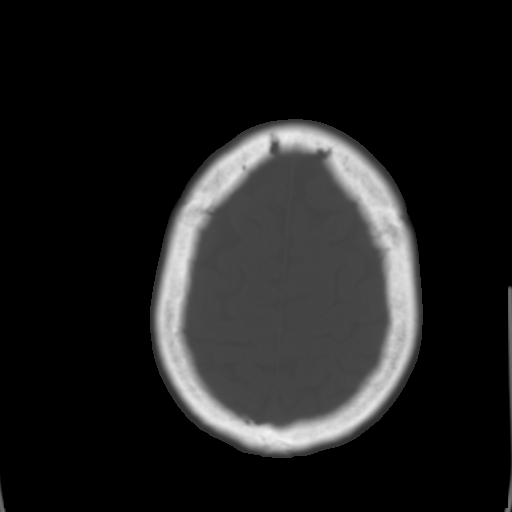
[im 29/36  brain]
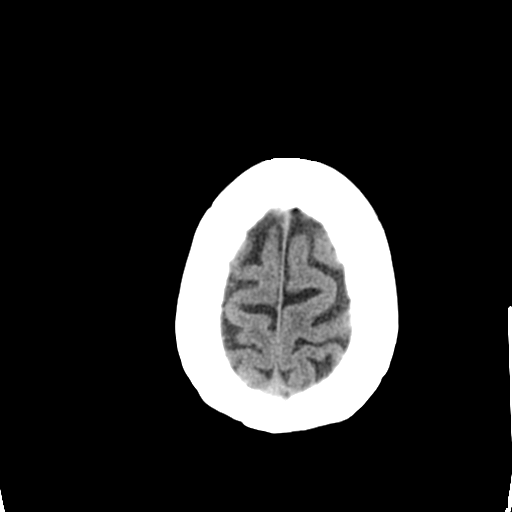
[im 32/36  brain]
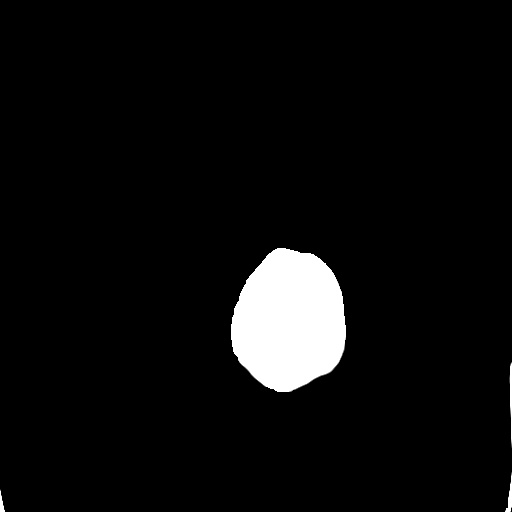
[im 34/36  brain]
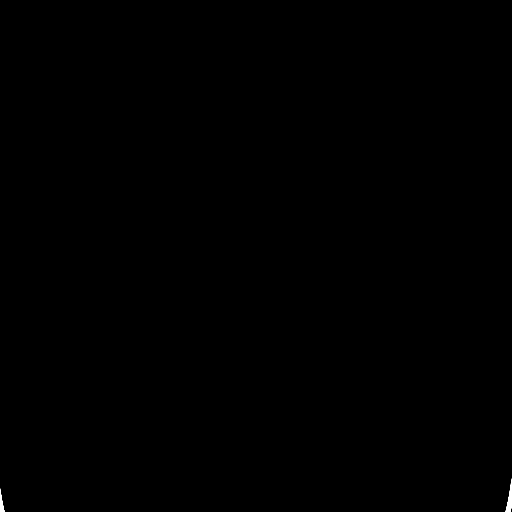

[16 of 30 positions shown; findings below may reference images not displayed]

FINDINGS: There is no acute intracranial hemorrhage or mass lesion. There is
prominent parietal periventricular white matter lucency in both
cerebral hemispheres, most likely due to chronic small vessel
ischemic disease. The ventricles are not dilated. Osseous structures
are normal.
IMPRESSION: Periventricular white matter disease primarily in the parietal
lobes, consistent with chronic small vessel ischemic disease.

## 2016-04-06 IMAGING — CR DG ANKLE COMPLETE 3+V*R*
3 series · 3 of 3 positions shown · non-contrast
Comparison: None.

CLINICAL DATA: Status post fall on [REDACTED].

EXAM:
RIGHT ANKLE - COMPLETE 3+ VIEW

[view not recorded (1 of 3)]
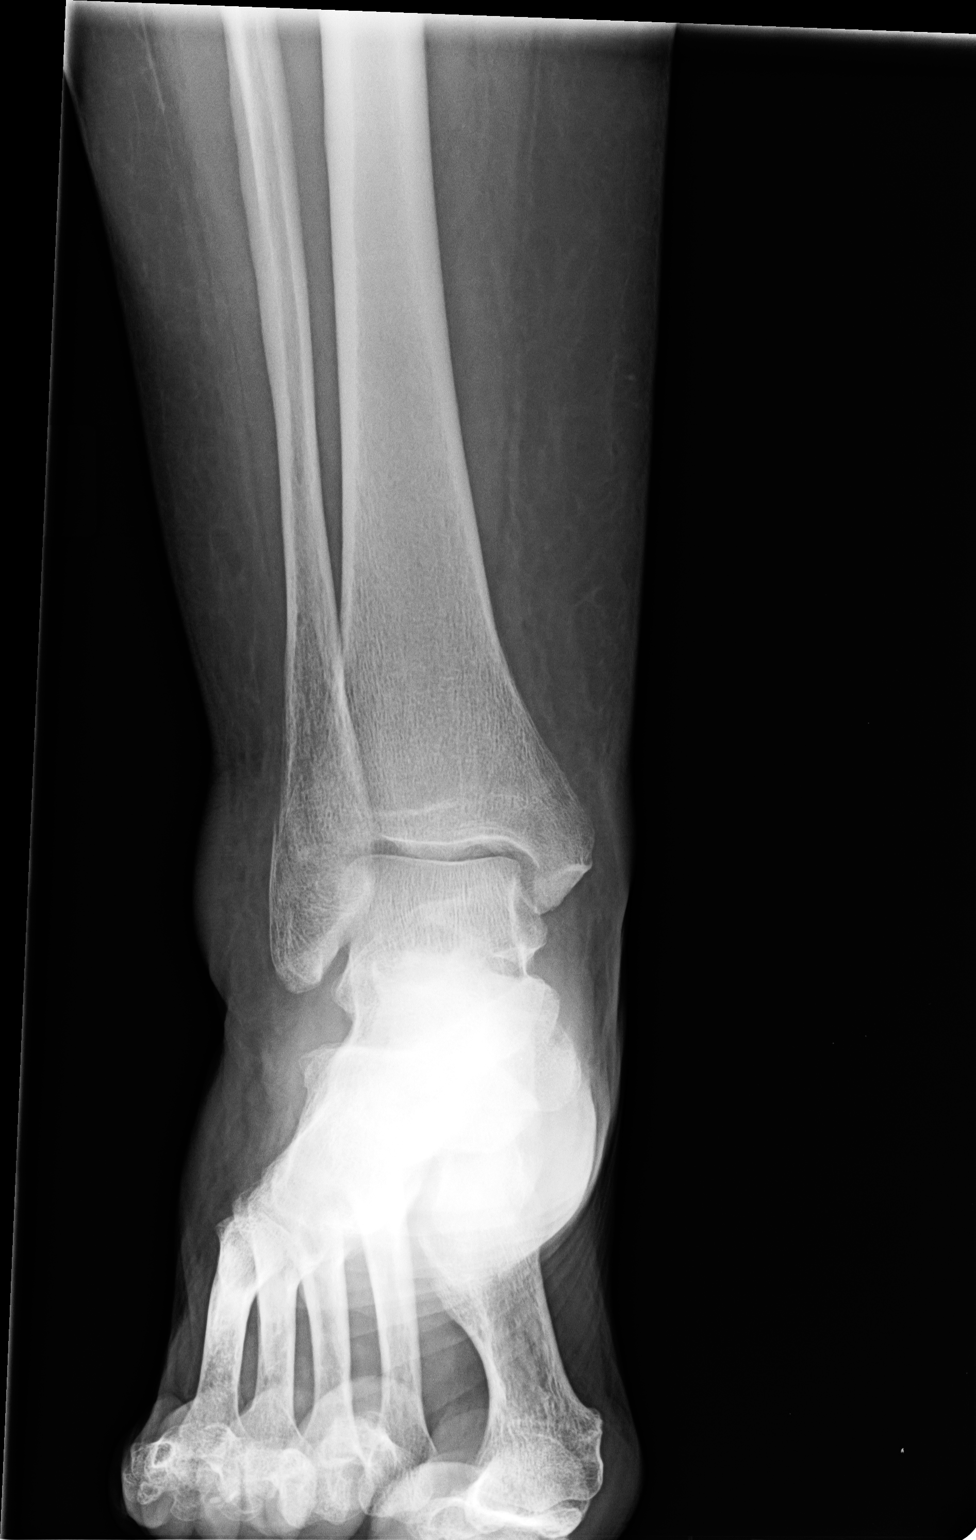

[view not recorded (2 of 3)]
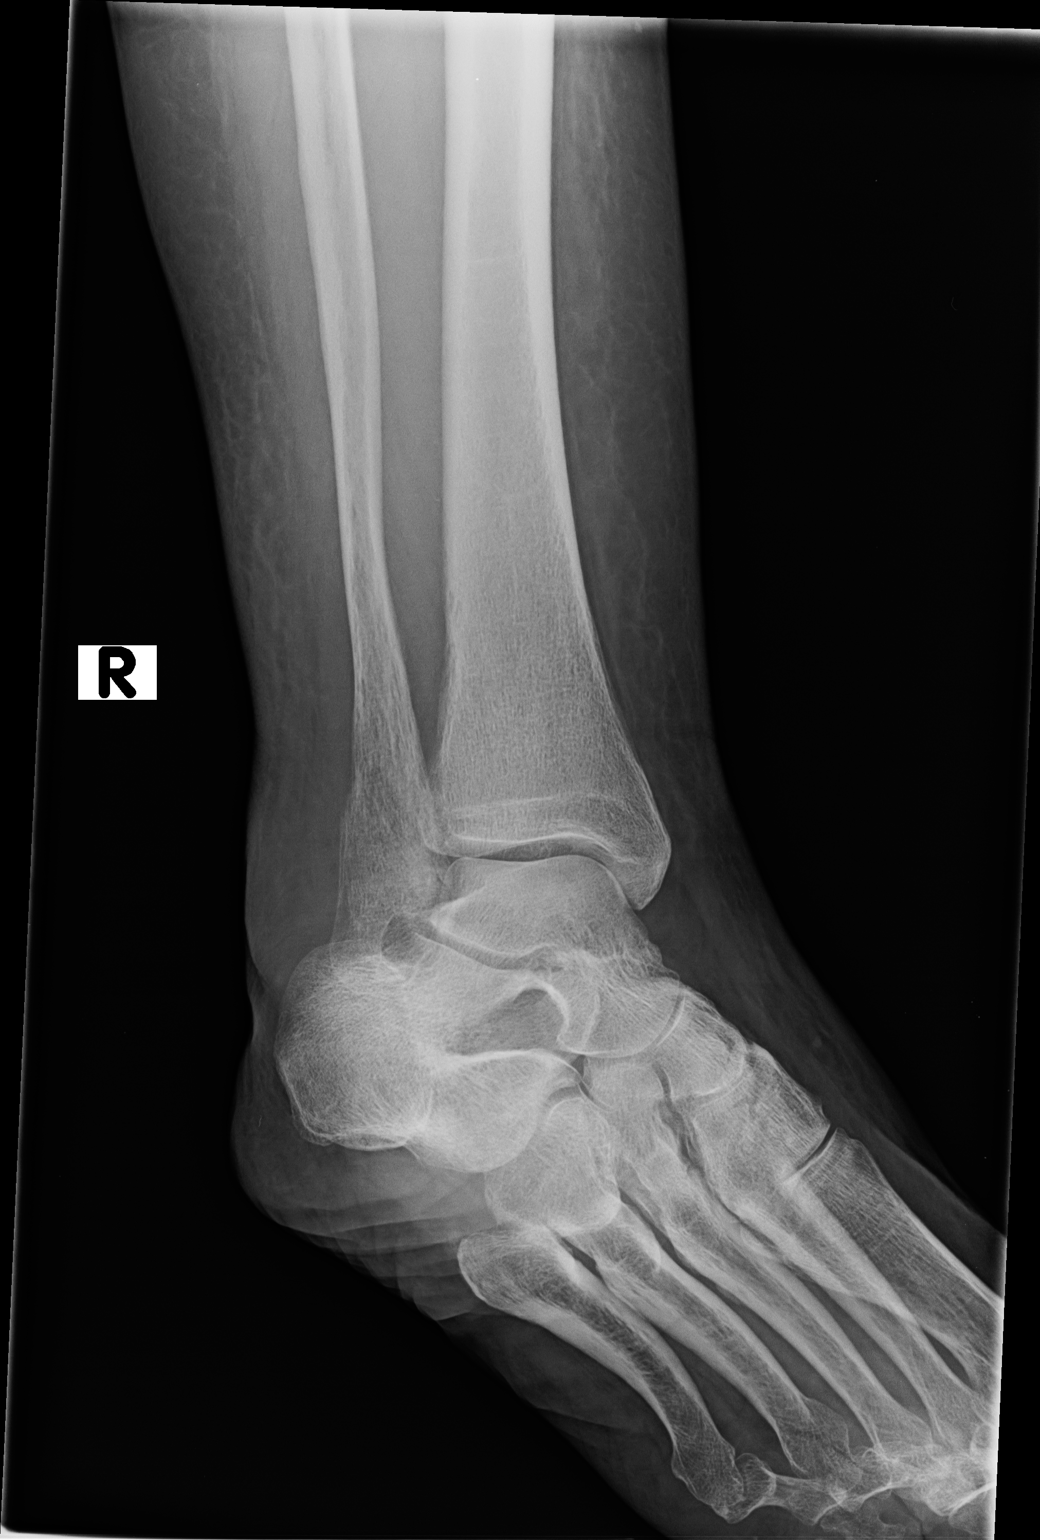

[view not recorded (3 of 3)]
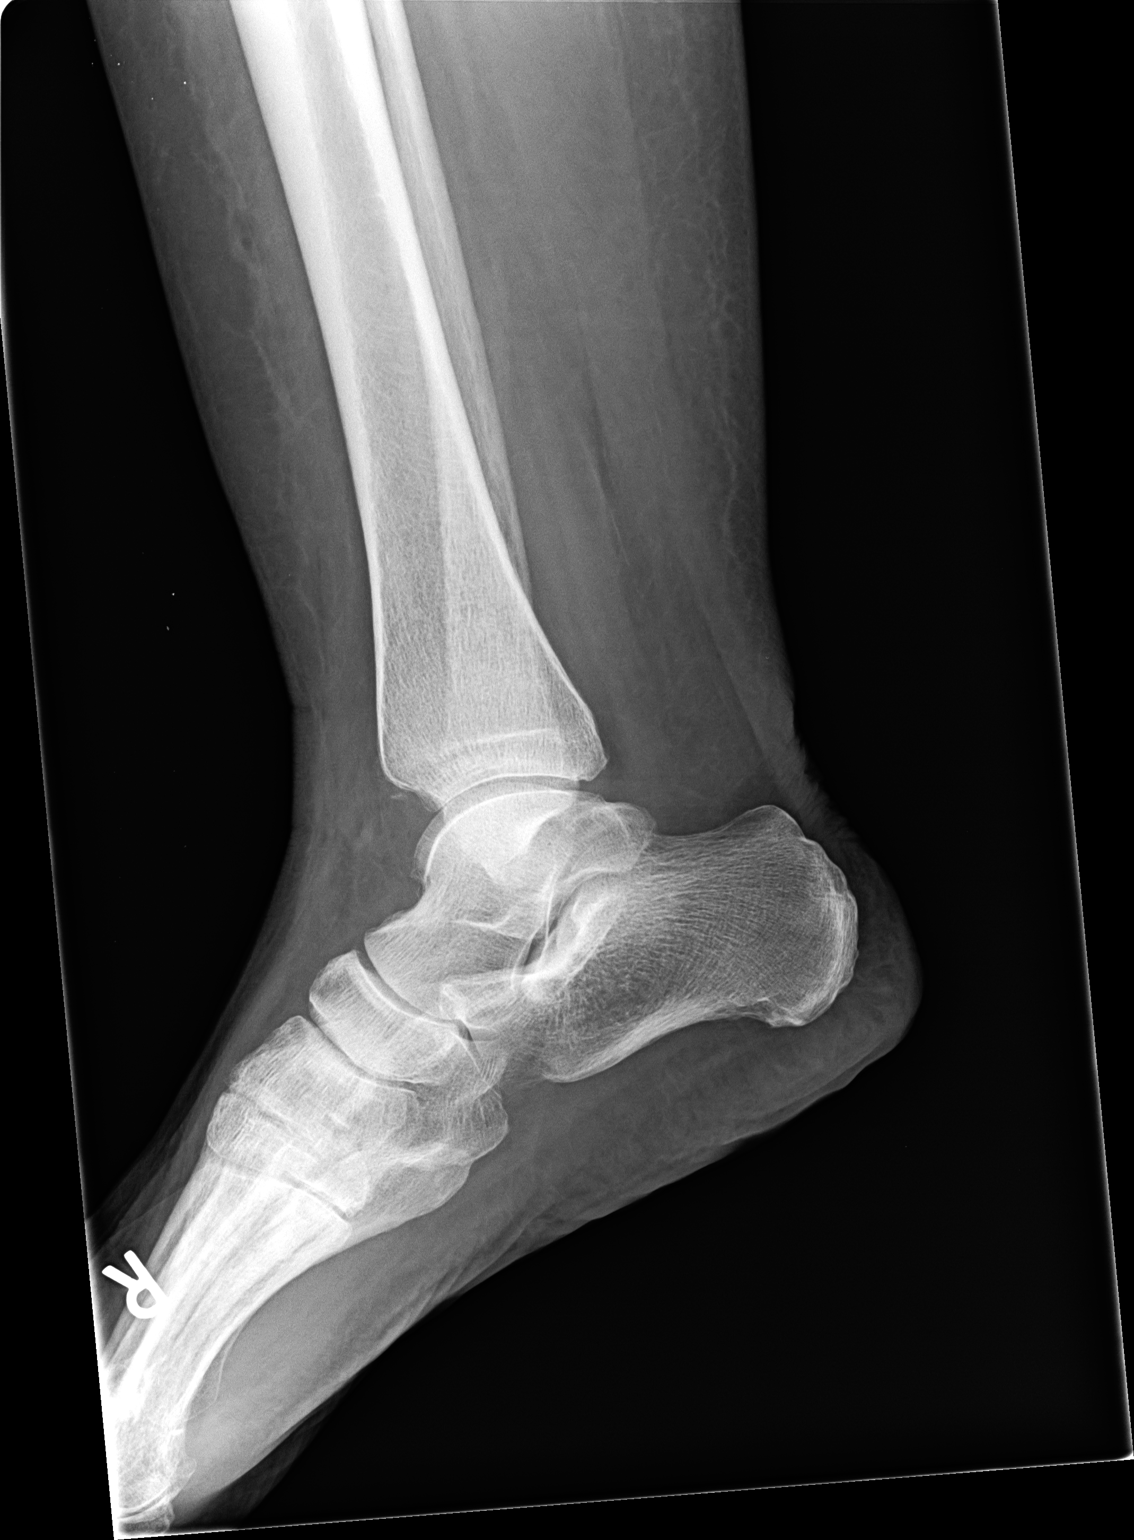

[3 of 3 positions shown; findings below may reference images not displayed]

FINDINGS: There is a nondisplaced oblique fracture of the distal fibular
metaphysis with severe overlying soft tissue swelling. There is a
small os ossific fragment anterior to the distal tibia which may
reflect an associated avulsive injury. There is no other fracture or
dislocation. Ankle mortise is intact.
IMPRESSION: There is a nondisplaced oblique fracture of the distal fibular
metaphysis with severe overlying soft tissue swelling. There is a
small os ossific fragment anterior to the distal tibia which may
reflect an associated avulsive injury.

## 2016-04-15 ENCOUNTER — Other Ambulatory Visit: Payer: Self-pay | Admitting: Family

## 2016-04-15 MED ORDER — MISOPROSTOL 200 MCG PO TABS: ORAL_TABLET | ORAL | 0 refills | Status: DC

## 2016-04-15 NOTE — Telephone Encounter (Signed)
Please review and advise Denise Gardner pt

## 2016-04-15 NOTE — Telephone Encounter (Signed)
LMOVM that refill was sent to [facility] before next refill

## 2016-04-15 NOTE — Telephone Encounter (Signed)
I don't see why she would be on this medication in her chart but go ahead and send 1-90 day prescription and have her follow up with Christy in the future on this issue.

## 2016-04-20 ENCOUNTER — Other Ambulatory Visit: Payer: Self-pay | Admitting: Family

## 2016-04-20 ENCOUNTER — Other Ambulatory Visit: Payer: Self-pay | Admitting: Pharmacist

## 2016-04-20 DIAGNOSIS — R69 Illness, unspecified: Secondary | ICD-10-CM | POA: Diagnosis not present

## 2016-04-21 ENCOUNTER — Telehealth: Payer: Self-pay | Admitting: Family

## 2016-04-21 DIAGNOSIS — R69 Illness, unspecified: Secondary | ICD-10-CM | POA: Diagnosis not present

## 2016-04-21 MED ORDER — GLUCOSE BLOOD VI STRP: ORAL_STRIP | 3 refills | Status: DC

## 2016-04-21 NOTE — Telephone Encounter (Signed)
What is the name of the medication? One Touch ultra  Have you contacted your pharmacy to request a refill? yes  Which pharmacy would you like this sent to?   Patient notified that their request is being sent to the clinical staff for review and that they should receive a call once it is complete. If they do not receive a call within 24 hours they can check with their pharmacy or our office.

## 2016-04-21 NOTE — Telephone Encounter (Signed)
Prescription sent to pharmacy.

## 2016-04-22 DIAGNOSIS — R69 Illness, unspecified: Secondary | ICD-10-CM | POA: Diagnosis not present

## 2016-04-25 ENCOUNTER — Telehealth: Payer: Self-pay | Admitting: Family

## 2016-04-25 DIAGNOSIS — E1149 Type 2 diabetes mellitus with other diabetic neurological complication: Secondary | ICD-10-CM

## 2016-04-25 NOTE — Telephone Encounter (Signed)
What is the name of the medication? accucheck fast clix lancets, twice a day  Have you contacted your pharmacy to request a refill? yes  Which pharmacy would you like this sent to? [facility] in [place]. Her rx needs to be for 90 days.   Patient notified that their request is being sent to the clinical staff for review and that they should receive a call once it is complete. If they do not receive a call within 24 hours they can check with their pharmacy or our office.

## 2016-04-26 DIAGNOSIS — R69 Illness, unspecified: Secondary | ICD-10-CM | POA: Diagnosis not present

## 2016-04-26 MED ORDER — ACCU-CHEK SOFTCLIX LANCETS MISC: 11 refills | Status: DC

## 2016-04-26 NOTE — Telephone Encounter (Signed)
done

## 2016-05-05 NOTE — Progress Notes (Signed)
Detailed message left for patient to remind her to complete FOBT and get back to [place] ASAP

## 2016-05-06 ENCOUNTER — Telehealth: Payer: Self-pay | Admitting: Family

## 2016-05-06 NOTE — Telephone Encounter (Signed)
Pt state she got TC about bringing something in. Recommended that it may have been the FOBT. She will do that and bring it in at her next visit in a couple of weeks

## 2016-05-12 ENCOUNTER — Other Ambulatory Visit: Payer: Self-pay | Admitting: Family

## 2016-05-16 ENCOUNTER — Other Ambulatory Visit: Payer: Self-pay | Admitting: Family

## 2016-05-16 DIAGNOSIS — K2100 Gastro-esophageal reflux disease with esophagitis, without bleeding: Secondary | ICD-10-CM

## 2016-05-17 DIAGNOSIS — R69 Illness, unspecified: Secondary | ICD-10-CM | POA: Diagnosis not present

## 2016-05-19 ENCOUNTER — Ambulatory Visit (INDEPENDENT_AMBULATORY_CARE_PROVIDER_SITE_OTHER): Payer: Medicare HMO | Admitting: Family

## 2016-05-19 ENCOUNTER — Encounter: Payer: Self-pay | Admitting: Family

## 2016-05-19 VITALS — BP 134/73 | HR 69 | Temp 97.0°F | Ht 65.0 in | Wt 191.0 lb

## 2016-05-19 DIAGNOSIS — K5900 Constipation, unspecified: Secondary | ICD-10-CM

## 2016-05-19 DIAGNOSIS — I10 Essential (primary) hypertension: Secondary | ICD-10-CM

## 2016-05-19 DIAGNOSIS — F331 Major depressive disorder, recurrent, moderate: Secondary | ICD-10-CM

## 2016-05-19 DIAGNOSIS — G6289 Other specified polyneuropathies: Secondary | ICD-10-CM

## 2016-05-19 DIAGNOSIS — R69 Illness, unspecified: Secondary | ICD-10-CM | POA: Diagnosis not present

## 2016-05-19 DIAGNOSIS — E876 Hypokalemia: Secondary | ICD-10-CM

## 2016-05-19 DIAGNOSIS — I25119 Atherosclerotic heart disease of native coronary artery with unspecified angina pectoris: Secondary | ICD-10-CM

## 2016-05-19 DIAGNOSIS — N3281 Overactive bladder: Secondary | ICD-10-CM

## 2016-05-19 DIAGNOSIS — E039 Hypothyroidism, unspecified: Secondary | ICD-10-CM

## 2016-05-19 DIAGNOSIS — K219 Gastro-esophageal reflux disease without esophagitis: Secondary | ICD-10-CM

## 2016-05-19 DIAGNOSIS — E785 Hyperlipidemia, unspecified: Secondary | ICD-10-CM

## 2016-05-19 DIAGNOSIS — M8580 Other specified disorders of bone density and structure, unspecified site: Secondary | ICD-10-CM

## 2016-05-19 DIAGNOSIS — J45909 Unspecified asthma, uncomplicated: Secondary | ICD-10-CM

## 2016-05-19 DIAGNOSIS — F419 Anxiety disorder, unspecified: Secondary | ICD-10-CM

## 2016-05-19 DIAGNOSIS — E1149 Type 2 diabetes mellitus with other diabetic neurological complication: Secondary | ICD-10-CM | POA: Diagnosis not present

## 2016-05-19 DIAGNOSIS — J449 Chronic obstructive pulmonary disease, unspecified: Secondary | ICD-10-CM

## 2016-05-19 LAB — BAYER DCA HB A1C WAIVED: HB A1C (BAYER DCA - WAIVED): 5.6 % (ref <7.0)

## 2016-05-19 MED ORDER — DICLOFENAC SODIUM 1 % TD GEL: 2.0000 g | Freq: Four times a day (QID) | TRANSDERMAL | 2 refills | Status: DC

## 2016-05-19 MED ORDER — MISOPROSTOL 200 MCG PO TABS: ORAL_TABLET | ORAL | 1 refills | Status: DC

## 2016-05-19 NOTE — Patient Instructions (Signed)
Fall Prevention in the Home Falls can cause injuries and can affect people from all age groups. There are many simple things that you can do to make your home safe and to help prevent falls. What can I do on the outside of my home?  Regularly repair the edges of walkways and driveways and fix any cracks.  Remove high doorway thresholds.  Trim any shrubbery on the main path into your home.  Use bright outdoor lighting.  Clear walkways of debris and clutter, including tools and rocks.  Regularly check that handrails are securely fastened and in good repair. Both sides of any steps should have handrails.  Install guardrails along the edges of any raised decks or porches.  Have leaves, snow, and ice cleared regularly.  Use sand or salt on walkways during winter months.  In the garage, clean up any spills right away, including grease or oil spills. What can I do in the bathroom?  Use night lights.  Install grab bars by the toilet and in the tub and shower. Do not use towel bars as grab bars.  Use non-skid mats or decals on the floor of the tub or shower.  If you need to sit down while you are in the shower, use a plastic, non-slip stool.  Keep the floor dry. Immediately clean up any water that spills on the floor.  Remove soap buildup in the tub or shower on a regular basis.  Attach bath mats securely with double-sided non-slip rug tape.  Remove throw rugs and other tripping hazards from the floor. What can I do in the bedroom?  Use night lights.  Make sure that a bedside light is easy to reach.  Do not use oversized bedding that drapes onto the floor.  Have a firm chair that has side arms to use for getting dressed.  Remove throw rugs and other tripping hazards from the floor. What can I do in the kitchen?  Clean up any spills right away.  Avoid walking on wet floors.  Place frequently used items in easy-to-reach places.  If you need to reach for something above  you, use a sturdy step stool that has a grab bar.  Keep electrical cables out of the way.  Do not use floor polish or wax that makes floors slippery. If you have to use wax, make sure that it is non-skid floor wax.  Remove throw rugs and other tripping hazards from the floor. What can I do in the stairways?  Do not leave any items on the stairs.  Make sure that there are handrails on both sides of the stairs. Fix handrails that are broken or loose. Make sure that handrails are as long as the stairways.  Check any carpeting to make sure that it is firmly attached to the stairs. Fix any carpet that is loose or worn.  Avoid having throw rugs at the top or bottom of stairways, or secure the rugs with carpet tape to prevent them from moving.  Make sure that you have a light switch at the top of the stairs and the bottom of the stairs. If you do not have them, have them installed. What are some other fall prevention tips?  Wear closed-toe shoes that fit well and support your feet. Wear shoes that have rubber soles or low heels.  When you use a stepladder, make sure that it is completely opened and that the sides are firmly locked. Have someone hold the ladder while you are using   it. Do not climb a closed stepladder.  Add color or contrast paint or tape to grab bars and handrails in your home. Place contrasting color strips on the first and last steps.  Use mobility aids as needed, such as canes, walkers, scooters, and crutches.  Turn on lights if it is dark. Replace any light bulbs that burn out.  Set up furniture so that there are clear paths. Keep the furniture in the same spot.  Fix any uneven floor surfaces.  Choose a carpet design that does not hide the edge of steps of a stairway.  Be aware of any and all pets.  Review your medicines with your healthcare provider. Some medicines can cause dizziness or changes in blood pressure, which increase your risk of falling. Talk with  your health care provider about other ways that you can decrease your risk of falls. This may include working with a physical therapist or trainer to improve your strength, balance, and endurance. This information is not intended to replace advice given to you by your health care provider. Make sure you discuss any questions you have with your health care provider. Document Released: 02/04/2002 Document Revised: 07/14/2015 Document Reviewed: 03/21/2014 Elsevier Interactive Patient Education  2017 [employer] Inc[employer]  

## 2016-05-19 NOTE — Progress Notes (Addendum)
Subjective:    Patient ID: Denise Gardner, female    DOB: [birth year], 71 y.o.   MRN: [id number]  PT presents to the office today for chronic follow up.  Hypertension  This is a chronic problem. The current episode started more than 1 year ago. The problem has been resolved since onset. The problem is controlled. Associated symptoms include anxiety and peripheral edema ("at times"). Pertinent negatives include no headaches, palpitations or shortness of breath. Risk factors for coronary artery disease include diabetes mellitus, dyslipidemia, family history, obesity, post-menopausal state and sedentary lifestyle. Past treatments include beta blockers and calcium channel blockers. Hypertensive end-organ damage includes CAD/MI and heart failure. There is no history of kidney disease or CVA. There is no history of sleep apnea or a thyroid problem.  Hyperlipidemia  This is a chronic problem. The current episode started more than 1 year ago. The problem is controlled. Recent lipid tests were reviewed and are normal. Exacerbating diseases include diabetes, hypothyroidism and obesity. Pertinent negatives include no shortness of breath. Current antihyperlipidemic treatment includes herbal therapy and nicotinic acid. The current treatment provides moderate improvement of lipids. Risk factors for coronary artery disease include diabetes mellitus, dyslipidemia, family history, hypertension, obesity, a sedentary lifestyle and post-menopausal.  Diabetes  She presents for her follow-up diabetic visit. She has type 2 diabetes mellitus. Her disease course has been worsening. Hypoglycemia symptoms include nervousness/anxiousness. Pertinent negatives for hypoglycemia include no confusion, headaches, mood changes or sleepiness. Associated symptoms include foot paresthesias and visual change. Pertinent negatives for diabetes include no foot ulcerations. Pertinent negatives for hypoglycemia complications include no blackouts and  no hospitalization. Symptoms are worsening. Diabetic complications include heart disease and peripheral neuropathy. Pertinent negatives for diabetic complications include no CVA or nephropathy. Risk factors for coronary artery disease include diabetes mellitus, dyslipidemia, family history, hypertension, obesity, sedentary lifestyle and post-menopausal. Current diabetic treatment includes oral agent (triple therapy). She is compliant with treatment all of the time. She is following a generally unhealthy diet. Her breakfast blood glucose range is generally 130-140 mg/dl. An ACE inhibitor/angiotensin II receptor blocker is not being taken. Eye exam is current.  Anxiety  Presents for follow-up visit. Onset was 1 to 6 months ago. The problem has been waxing and waning. Symptoms include excessive worry and nervous/anxious behavior. Patient reports no confusion, insomnia, irritability, palpitations or shortness of breath. Symptoms occur occasionally.   Her past medical history is significant for anxiety/panic attacks, asthma and depression. Past treatments include nothing. The treatment provided mild relief. Compliance with prior treatments has been good.  Gastroesophageal Reflux  She complains of coughing, heartburn and a hoarse voice. She reports no belching, no sore throat or no wheezing. This is a chronic problem. The current episode started more than 1 year ago. The problem occurs occasionally. The problem has been resolved. The symptoms are aggravated by certain foods. She has tried a PPI and a diet change for the symptoms. The treatment provided significant relief.  Asthma  She complains of cough, frequent throat clearing and hoarse voice. There is no shortness of breath or wheezing. This is a chronic problem. The current episode started more than 1 year ago. The problem occurs intermittently. The problem has been waxing and waning. Associated symptoms include heartburn. Pertinent negatives include no  headaches or sore throat. Her symptoms are aggravated by change in weather. Her symptoms are alleviated by rest and steroid inhaler. She reports moderate improvement on treatment. Her past medical history is significant for asthma and COPD.  Thyroid Problem  Presents for follow-up visit. Symptoms include anxiety, constipation, dry skin, hair loss, hoarse voice and visual change. Patient reports no diarrhea or palpitations. The symptoms have been worsening. Past treatments include levothyroxine. The treatment provided significant relief. Her past medical history is significant for diabetes, heart failure and hyperlipidemia.  Constipation  This is a chronic problem. The current episode started more than 1 year ago. The problem has been waxing and waning since onset. Her stool frequency is 4 to 5 times per week. Pertinent negatives include no diarrhea. She has tried diet changes and laxatives for the symptoms. The treatment provided mild relief.  Peripheral Neuropathy Pt currently taking gabapentin 376m TID. Pt states this seems to be helping with the "burning in her feet".  Osteopenia PT currently taking Evista 60 mg daily. Last Bone Density Scan 10/23/14. OAB PT states she continues to have frequency tries to limit sodas.  COPD PT uses albuterol as needed. Currently does not smoke. Hypokalemia PT currently taking Potassium Chloride 20 meq daily. Stable.   Review of Systems  Constitutional: Negative.  Negative for irritability.  HENT: Positive for hoarse voice. Negative for sore throat.   Eyes: Negative.   Respiratory: Positive for cough. Negative for shortness of breath and wheezing.   Cardiovascular: Negative.  Negative for palpitations.  Gastrointestinal: Positive for constipation and heartburn. Negative for diarrhea.  Endocrine: Negative.   Musculoskeletal: Negative.   Neurological: Negative.  Negative for headaches.  Hematological: Negative.   Psychiatric/Behavioral: Negative for  confusion. The patient is nervous/anxious. The patient does not have insomnia.   All other systems reviewed and are negative.      Objective:   Physical Exam  Constitutional: She is oriented to person, place, and time. She appears well-developed and well-nourished. No distress.  HENT:  Head: Normocephalic and atraumatic.  Right Ear: External ear normal.  Left Ear: External ear normal.  Nose: Nose normal.  Mouth/Throat: Oropharynx is clear and moist.  Eyes: Pupils are equal, round, and reactive to light.  Neck: Normal range of motion. Neck supple. No thyromegaly present.  Cardiovascular: Normal rate, regular rhythm, normal heart sounds and intact distal pulses.   No murmur heard. Pulmonary/Chest: Effort normal. No respiratory distress. She has decreased breath sounds. She has no wheezes.  Abdominal: Soft. Bowel sounds are normal. She exhibits no distension. There is no tenderness.  Musculoskeletal: Normal range of motion. She exhibits edema (trace in BLE). She exhibits no tenderness.  Pt using a cane to walk with  Neurological: She is alert and oriented to person, place, and time.  Skin: Skin is warm and dry.  Psychiatric: She has a normal mood and affect. Her behavior is normal. Judgment and thought content normal.  Vitals reviewed.   BP 134/73   Pulse 69   Temp 97 F (36.1 C) (Oral)   Ht '5\' 5"'  (1.651 m)   Wt 191 lb (86.6 kg)   BMI 31.78 kg/m   Diabetic Foot Exam - Simple   Simple Foot Form Diabetic Foot exam was performed with the following findings:  Yes 05/19/2016 11:59 AM  Visual Inspection No deformities, no ulcerations, no other skin breakdown bilaterally:  Yes Sensation Testing Intact to touch and monofilament testing bilaterally:  Yes Pulse Check Posterior Tibialis and Dorsalis pulse intact bilaterally:  Yes Comments         Assessment & Plan:  1. Essential hypertension - CMP14+EGFR  2. Coronary artery disease involving native coronary artery of native  heart with angina pectoris ([place] -  CMP14+EGFR - Lipid panel  3. Chronic obstructive pulmonary disease, unspecified COPD type ([place]) - CMP14+EGFR  4. Uncomplicated asthma, unspecified asthma severity, unspecified whether persistent - CMP14+EGFR  5. Gastroesophageal reflux disease, esophagitis presence not specified - CMP14+EGFR  6. Hypothyroidism, unspecified type - CMP14+EGFR - Thyroid Panel With TSH  7. Diabetes mellitus type 2 with neurological manifestations ([place]) - CMP14+EGFR - Microalbumin / creatinine urine ratio - Bayer DCA Hb A1c Waived  8. Other polyneuropathy ([place]) - CMP14+EGFR  9. Anxiety - CMP14+EGFR  10. Moderate episode of recurrent major depressive disorder (HCC) - CMP14+EGFR  11. Hyperlipidemia, unspecified hyperlipidemia type - CMP14+EGFR - Lipid panel  12. Hypokalemia - CMP14+EGFR  13. OAB (overactive bladder) - CMP14+EGFR  14. Osteopenia, unspecified location - CMP14+EGFR  15. Constipation, unspecified constipation type - misoprostol (CYTOTEC) 200 MCG tablet; TAKE 1 TABLET(200 MCG) BY MOUTH FOUR TIMES DAILY  Dispense: 360 tablet; Refill: 1   Continue all meds Labs pending Health Maintenance reviewed Diet and exercise encouraged RTO 3 months   Evelina Dun, FNP

## 2016-05-19 NOTE — Addendum Note (Signed)
Addended by: Wyline Mood on: 05/19/2016 02:33 PM   Modules accepted: Orders

## 2016-05-19 NOTE — Addendum Note (Signed)
Addended by: Shelbie Ammons on: 05/19/2016 02:31 PM   Modules accepted: Orders

## 2016-05-20 ENCOUNTER — Other Ambulatory Visit: Payer: Self-pay | Admitting: Family

## 2016-05-20 ENCOUNTER — Telehealth: Payer: Self-pay | Admitting: Family

## 2016-05-20 LAB — CMP14+EGFR
ALT: 11 IU/L (ref 0–32)
AST: 20 IU/L (ref 0–40)
Albumin/Globulin Ratio: 1.5 (ref 1.2–2.2)
Albumin: 4.2 g/dL (ref 3.5–4.8)
Alkaline Phosphatase: 41 IU/L (ref 39–117)
BUN/Creatinine Ratio: 20 (ref 12–28)
BUN: 19 mg/dL (ref 8–27)
Bilirubin Total: 0.3 mg/dL (ref 0.0–1.2)
CO2: 24 mmol/L (ref 18–29)
Calcium: 10 mg/dL (ref 8.7–10.3)
Chloride: 100 mmol/L (ref 96–106)
Creatinine, Ser: 0.96 mg/dL (ref 0.57–1.00)
GFR calc Af Amer: 68 mL/min/{1.73_m2} (ref >59)
GFR calc non Af Amer: 59 mL/min/{1.73_m2} — ABNORMAL LOW (ref >59)
Globulin, Total: 2.8 g/dL (ref 1.5–4.5)
Glucose: 96 mg/dL (ref 65–99)
Potassium: 5.1 mmol/L (ref 3.5–5.2)
Sodium: 140 mmol/L (ref 134–144)
Total Protein: 7 g/dL (ref 6.0–8.5)

## 2016-05-20 LAB — LIPID PANEL
Chol/HDL Ratio: 3.2 ratio units (ref 0.0–4.4)
Cholesterol, Total: 198 mg/dL (ref 100–199)
HDL: 62 mg/dL (ref >39)
LDL Calculated: 119 mg/dL — ABNORMAL HIGH (ref 0–99)
Triglycerides: 87 mg/dL (ref 0–149)
VLDL Cholesterol Cal: 17 mg/dL (ref 5–40)

## 2016-05-20 LAB — MICROALBUMIN / CREATININE URINE RATIO
Creatinine, Urine: 28.7 mg/dL
Microalb/Creat Ratio: 10.8 mg/g creat (ref 0.0–30.0)
Microalbumin, Urine: 3.1 ug/mL

## 2016-05-20 LAB — THYROID PANEL WITH TSH
Free Thyroxine Index: 3.6 (ref 1.2–4.9)
T3 Uptake Ratio: 33 % (ref 24–39)
T4, Total: 10.9 ug/dL (ref 4.5–12.0)
TSH: 0.034 u[IU]/mL — ABNORMAL LOW (ref 0.450–4.500)

## 2016-05-20 MED ORDER — LEVOTHYROXINE SODIUM 100 MCG PO TABS: 100.0000 ug | ORAL_TABLET | Freq: Every day | ORAL | 1 refills | Status: DC

## 2016-05-20 NOTE — Telephone Encounter (Signed)
plz call her on lab work

## 2016-05-20 NOTE — Telephone Encounter (Signed)
Aware. 

## 2016-05-24 ENCOUNTER — Other Ambulatory Visit: Payer: Self-pay | Admitting: *Deleted

## 2016-05-24 ENCOUNTER — Other Ambulatory Visit (INDEPENDENT_AMBULATORY_CARE_PROVIDER_SITE_OTHER): Payer: Medicare HMO

## 2016-05-24 DIAGNOSIS — Z1212 Encounter for screening for malignant neoplasm of rectum: Secondary | ICD-10-CM | POA: Diagnosis not present

## 2016-05-24 DIAGNOSIS — Z1211 Encounter for screening for malignant neoplasm of colon: Secondary | ICD-10-CM | POA: Diagnosis not present

## 2016-05-26 LAB — FECAL OCCULT BLOOD, IMMUNOCHEMICAL: Fecal Occult Bld: NEGATIVE

## 2016-06-05 ENCOUNTER — Other Ambulatory Visit: Payer: Self-pay | Admitting: Family

## 2016-06-06 ENCOUNTER — Other Ambulatory Visit: Payer: Self-pay | Admitting: Family

## 2016-06-07 DIAGNOSIS — R69 Illness, unspecified: Secondary | ICD-10-CM | POA: Diagnosis not present

## 2016-06-18 ENCOUNTER — Other Ambulatory Visit: Payer: Self-pay | Admitting: Family

## 2016-07-01 ENCOUNTER — Other Ambulatory Visit: Payer: Self-pay | Admitting: Family

## 2016-07-01 DIAGNOSIS — F419 Anxiety disorder, unspecified: Secondary | ICD-10-CM

## 2016-07-01 DIAGNOSIS — F329 Major depressive disorder, single episode, unspecified: Secondary | ICD-10-CM

## 2016-07-01 DIAGNOSIS — F32A Depression, unspecified: Secondary | ICD-10-CM

## 2016-07-14 ENCOUNTER — Other Ambulatory Visit: Payer: Self-pay | Admitting: Family

## 2016-07-15 DIAGNOSIS — R69 Illness, unspecified: Secondary | ICD-10-CM | POA: Diagnosis not present

## 2016-07-18 ENCOUNTER — Other Ambulatory Visit: Payer: Self-pay | Admitting: Family

## 2016-07-22 ENCOUNTER — Other Ambulatory Visit: Payer: Self-pay | Admitting: Family

## 2016-07-22 DIAGNOSIS — J4520 Mild intermittent asthma, uncomplicated: Secondary | ICD-10-CM

## 2016-07-22 DIAGNOSIS — R69 Illness, unspecified: Secondary | ICD-10-CM | POA: Diagnosis not present

## 2016-07-24 DIAGNOSIS — R69 Illness, unspecified: Secondary | ICD-10-CM | POA: Diagnosis not present

## 2016-08-08 ENCOUNTER — Other Ambulatory Visit: Payer: Self-pay | Admitting: Family

## 2016-08-08 DIAGNOSIS — M8580 Other specified disorders of bone density and structure, unspecified site: Secondary | ICD-10-CM

## 2016-08-13 ENCOUNTER — Other Ambulatory Visit: Payer: Self-pay | Admitting: Family

## 2016-08-13 DIAGNOSIS — K2100 Gastro-esophageal reflux disease with esophagitis, without bleeding: Secondary | ICD-10-CM

## 2016-08-15 ENCOUNTER — Other Ambulatory Visit: Payer: Self-pay | Admitting: Family

## 2016-08-19 ENCOUNTER — Ambulatory Visit (INDEPENDENT_AMBULATORY_CARE_PROVIDER_SITE_OTHER): Payer: Medicare HMO | Admitting: Family

## 2016-08-19 ENCOUNTER — Encounter: Payer: Self-pay | Admitting: Family

## 2016-08-19 VITALS — BP 130/82 | HR 88 | Temp 97.8°F | Ht 65.0 in | Wt 200.0 lb

## 2016-08-19 DIAGNOSIS — M8580 Other specified disorders of bone density and structure, unspecified site: Secondary | ICD-10-CM

## 2016-08-19 DIAGNOSIS — E039 Hypothyroidism, unspecified: Secondary | ICD-10-CM | POA: Diagnosis not present

## 2016-08-19 DIAGNOSIS — E785 Hyperlipidemia, unspecified: Secondary | ICD-10-CM

## 2016-08-19 DIAGNOSIS — I10 Essential (primary) hypertension: Secondary | ICD-10-CM | POA: Diagnosis not present

## 2016-08-19 DIAGNOSIS — E1142 Type 2 diabetes mellitus with diabetic polyneuropathy: Secondary | ICD-10-CM | POA: Diagnosis not present

## 2016-08-19 DIAGNOSIS — J449 Chronic obstructive pulmonary disease, unspecified: Secondary | ICD-10-CM

## 2016-08-19 DIAGNOSIS — F331 Major depressive disorder, recurrent, moderate: Secondary | ICD-10-CM | POA: Diagnosis not present

## 2016-08-19 DIAGNOSIS — I25119 Atherosclerotic heart disease of native coronary artery with unspecified angina pectoris: Secondary | ICD-10-CM

## 2016-08-19 DIAGNOSIS — E1149 Type 2 diabetes mellitus with other diabetic neurological complication: Secondary | ICD-10-CM | POA: Diagnosis not present

## 2016-08-19 DIAGNOSIS — J45909 Unspecified asthma, uncomplicated: Secondary | ICD-10-CM

## 2016-08-19 DIAGNOSIS — K219 Gastro-esophageal reflux disease without esophagitis: Secondary | ICD-10-CM

## 2016-08-19 DIAGNOSIS — F419 Anxiety disorder, unspecified: Secondary | ICD-10-CM

## 2016-08-19 DIAGNOSIS — K5900 Constipation, unspecified: Secondary | ICD-10-CM

## 2016-08-19 LAB — BAYER DCA HB A1C WAIVED: HB A1C (BAYER DCA - WAIVED): 5.3 % (ref <7.0)

## 2016-08-19 MED ORDER — LEVOTHYROXINE SODIUM 100 MCG PO TABS: 100.0000 ug | ORAL_TABLET | Freq: Every day | ORAL | 1 refills | Status: DC

## 2016-08-19 MED ORDER — DIAZEPAM 5 MG PO TABS: 5.0000 mg | ORAL_TABLET | Freq: Two times a day (BID) | ORAL | 5 refills | Status: DC | PRN

## 2016-08-19 NOTE — Progress Notes (Signed)
Subjective:    Patient ID: Denise Gardner, female    DOB: 1945/07/20, 71 y.o.   MRN: [id number]  PT presents to the office today for chronic follow up.  Diabetes  She presents for her follow-up diabetic visit. She has type 2 diabetes mellitus. Her disease course has been stable. Hypoglycemia symptoms include nervousness/anxiousness. Associated symptoms include fatigue and foot paresthesias. Pertinent negatives for diabetes include no blurred vision and no visual change. Symptoms are stable. Diabetic complications include heart disease and peripheral neuropathy. Pertinent negatives for diabetic complications include no CVA or impotence. Her weight is stable. She is following a diabetic diet. Her breakfast blood glucose range is generally 110-130 mg/dl.  Hypertension  This is a chronic problem. The current episode started more than 1 year ago. The problem has been resolved since onset. The problem is controlled. Associated symptoms include anxiety, malaise/fatigue and shortness of breath. Pertinent negatives include no blurred vision or peripheral edema. The current treatment provides moderate improvement. Hypertensive end-organ damage includes CAD/MI. There is no history of CVA. Identifiable causes of hypertension include a thyroid problem.  Asthma  She complains of cough, hoarse voice, shortness of breath and wheezing. This is a chronic problem. The current episode started more than 1 year ago. The problem occurs rarely. The problem has been waxing and waning. Associated symptoms include malaise/fatigue. Pertinent negatives include no heartburn. Her symptoms are not alleviated by rest. Her past medical history is significant for asthma.  Constipation  This is a chronic problem. The current episode started more than 1 year ago. The problem has been waxing and waning since onset. Her stool frequency is 4 to 5 times per week. She has tried laxatives and diet changes for the symptoms. The treatment provided  moderate relief.  Gastroesophageal Reflux  She complains of coughing, a hoarse voice and wheezing. She reports no heartburn. This is a chronic problem. The problem occurs occasionally. The problem has been waxing and waning. Associated symptoms include fatigue. She has tried a PPI for the symptoms. The treatment provided moderate relief.  Thyroid Problem  Presents for follow-up visit. Symptoms include anxiety, constipation, depressed mood, fatigue and hoarse voice. Patient reports no visual change. The symptoms have been stable.  Anxiety  Presents for follow-up visit. Symptoms include depressed mood, excessive worry, nervous/anxious behavior, restlessness and shortness of breath. Patient reports no impotence. Symptoms occur occasionally.   Her past medical history is significant for asthma.  Depression         This is a chronic problem.  The current episode started more than 1 year ago.   The onset quality is gradual.   The problem occurs intermittently.  The problem has been waxing and waning since onset.  Associated symptoms include fatigue, restlessness and sad.  Associated symptoms include no helplessness and no hopelessness.     The symptoms are aggravated by family issues.  Past treatments include SSRIs - Selective serotonin reuptake inhibitors.  Past medical history includes thyroid problem and anxiety.   Osteopenia  PT taking Evista 60 mg daily. Last Dexa Scan 10/23/14 Diabetic Neuropathy Pt currently taking Gabapentin 300 mg TID. Stable  COPD PT takes albuterol as needed. Stable at this time.  Review of Systems  Constitutional: Positive for fatigue and malaise/fatigue.  HENT: Positive for hoarse voice.   Eyes: Negative for blurred vision.  Respiratory: Positive for cough, shortness of breath and wheezing.   Gastrointestinal: Positive for constipation. Negative for heartburn.  Genitourinary: Negative for impotence.  Psychiatric/Behavioral: Positive for  depression. The patient is  nervous/anxious.   All other systems reviewed and are negative.      Objective:   Physical Exam  Constitutional: She is oriented to person, place, and time. She appears well-developed and well-nourished. No distress.  HENT:  Head: Normocephalic and atraumatic.  Right Ear: External ear normal.  Left Ear: External ear normal.  Nose: Nose normal.  Mouth/Throat: Oropharynx is clear and moist.  Eyes: Pupils are equal, round, and reactive to light.  Neck: Normal range of motion. Neck supple. No thyromegaly present.  Cardiovascular: Normal rate, regular rhythm, normal heart sounds and intact distal pulses.   No murmur heard. Pulmonary/Chest: Effort normal and breath sounds normal. No respiratory distress. She has no wheezes.  Abdominal: Soft. Bowel sounds are normal. She exhibits no distension. There is no tenderness.  Musculoskeletal: Normal range of motion. She exhibits edema (trace BLE). She exhibits no tenderness.  Neurological: She is alert and oriented to person, place, and time. She has normal reflexes. No cranial nerve deficit.  Skin: Skin is warm and dry.  Psychiatric: She has a normal mood and affect. Her behavior is normal. Judgment and thought content normal.  Vitals reviewed.    BP 130/82   Pulse 88   Temp 97.8 F (36.6 C) (Oral)   Ht _0  (1.651 m)   Wt 200 lb (90.7 kg)   BMI 33.28 kg/m      Assessment & Plan:  1. Essential hypertension - CMP14+EGFR  2. Coronary artery disease involving native coronary artery of native heart with angina pectoris (HCC) - CMP14+EGFR - Lipid panel  3. Uncomplicated asthma, unspecified asthma severity, unspecified whether persistent - CMP14+EGFR  4. Gastroesophageal reflux disease, esophagitis presence not specified - CMP14+EGFR  5. Constipation, unspecified constipation type - CMP14+EGFR  6. Hypothyroidism, unspecified type - CMP14+EGFR - Thyroid Panel With TSH  7. Diabetes mellitus type 2 with neurological  manifestations ([place]) - CMP14+EGFR - Bayer DCA Hb A1c Waived - Ambulatory referral to Ophthalmology  8. Diabetic polyneuropathy associated with type 2 diabetes mellitus (HCC) - CMP14+EGFR  9. Osteopenia, unspecified location - CMP14+EGFR  10. Hyperlipidemia, unspecified hyperlipidemia type - CMP14+EGFR - Lipid panel  11. Anxiety - CMP14+EGFR - diazepam (VALIUM) 5 MG tablet; Take 1 tablet (5 mg total) by mouth every 12 (twelve) hours as needed.  Dispense: 60 tablet; Refill: 5  12. Moderate episode of recurrent major depressive disorder (HCC) - CMP14+EGFR  13. Chronic obstructive pulmonary disease, unspecified COPD type ([place])   Continue all meds Labs pending Health Maintenance reviewed Diet and exercise encouraged RTO 3 months   Evelina Dun, FNP

## 2016-08-19 NOTE — Patient Instructions (Signed)

## 2016-08-20 LAB — THYROID PANEL WITH TSH
Free Thyroxine Index: 3.2 (ref 1.2–4.9)
T3 Uptake Ratio: 31 % (ref 24–39)
T4, Total: 10.2 ug/dL (ref 4.5–12.0)
TSH: 0.12 u[IU]/mL — ABNORMAL LOW (ref 0.450–4.500)

## 2016-08-20 LAB — CMP14+EGFR
ALT: 11 IU/L (ref 0–32)
AST: 17 IU/L (ref 0–40)
Albumin/Globulin Ratio: 1.5 (ref 1.2–2.2)
Albumin: 4.1 g/dL (ref 3.5–4.8)
Alkaline Phosphatase: 42 IU/L (ref 39–117)
BUN/Creatinine Ratio: 20 (ref 12–28)
BUN: 21 mg/dL (ref 8–27)
Bilirubin Total: 0.2 mg/dL (ref 0.0–1.2)
CO2: 24 mmol/L (ref 20–29)
Calcium: 9.6 mg/dL (ref 8.7–10.3)
Chloride: 103 mmol/L (ref 96–106)
Creatinine, Ser: 1.05 mg/dL — ABNORMAL HIGH (ref 0.57–1.00)
GFR calc Af Amer: 61 mL/min/{1.73_m2} (ref >59)
GFR calc non Af Amer: 53 mL/min/{1.73_m2} — ABNORMAL LOW (ref >59)
Globulin, Total: 2.7 g/dL (ref 1.5–4.5)
Glucose: 81 mg/dL (ref 65–99)
Potassium: 4.7 mmol/L (ref 3.5–5.2)
Sodium: 142 mmol/L (ref 134–144)
Total Protein: 6.8 g/dL (ref 6.0–8.5)

## 2016-08-20 LAB — LIPID PANEL
Chol/HDL Ratio: 3.1 ratio (ref 0.0–4.4)
Cholesterol, Total: 183 mg/dL (ref 100–199)
HDL: 60 mg/dL (ref >39)
LDL Calculated: 110 mg/dL — ABNORMAL HIGH (ref 0–99)
Triglycerides: 66 mg/dL (ref 0–149)
VLDL Cholesterol Cal: 13 mg/dL (ref 5–40)

## 2016-08-23 ENCOUNTER — Other Ambulatory Visit: Payer: Self-pay | Admitting: Family

## 2016-08-23 MED ORDER — LEVOTHYROXINE SODIUM 88 MCG PO TABS: 88.0000 ug | ORAL_TABLET | Freq: Every day | ORAL | 11 refills | Status: DC

## 2016-09-01 ENCOUNTER — Other Ambulatory Visit: Payer: Self-pay | Admitting: Family

## 2016-09-01 DIAGNOSIS — J069 Acute upper respiratory infection, unspecified: Secondary | ICD-10-CM

## 2016-09-01 DIAGNOSIS — J309 Allergic rhinitis, unspecified: Secondary | ICD-10-CM

## 2016-09-06 DIAGNOSIS — R69 Illness, unspecified: Secondary | ICD-10-CM | POA: Diagnosis not present

## 2016-09-08 ENCOUNTER — Other Ambulatory Visit: Payer: Self-pay | Admitting: Family

## 2016-09-08 DIAGNOSIS — J4520 Mild intermittent asthma, uncomplicated: Secondary | ICD-10-CM

## 2016-09-08 DIAGNOSIS — R69 Illness, unspecified: Secondary | ICD-10-CM | POA: Diagnosis not present

## 2016-09-12 ENCOUNTER — Other Ambulatory Visit: Payer: Self-pay | Admitting: Family

## 2016-09-15 DIAGNOSIS — H5200 Hypermetropia, unspecified eye: Secondary | ICD-10-CM | POA: Diagnosis not present

## 2016-09-15 DIAGNOSIS — I10 Essential (primary) hypertension: Secondary | ICD-10-CM | POA: Diagnosis not present

## 2016-09-15 DIAGNOSIS — E109 Type 1 diabetes mellitus without complications: Secondary | ICD-10-CM | POA: Diagnosis not present

## 2016-09-15 LAB — HM DIABETES EYE EXAM

## 2016-09-19 ENCOUNTER — Other Ambulatory Visit: Payer: Self-pay | Admitting: Family

## 2016-09-19 DIAGNOSIS — K2100 Gastro-esophageal reflux disease with esophagitis, without bleeding: Secondary | ICD-10-CM

## 2016-09-26 ENCOUNTER — Other Ambulatory Visit: Payer: Self-pay | Admitting: Family

## 2016-09-26 DIAGNOSIS — F329 Major depressive disorder, single episode, unspecified: Secondary | ICD-10-CM

## 2016-09-26 DIAGNOSIS — F32A Depression, unspecified: Secondary | ICD-10-CM

## 2016-09-26 DIAGNOSIS — E1149 Type 2 diabetes mellitus with other diabetic neurological complication: Secondary | ICD-10-CM

## 2016-09-26 DIAGNOSIS — F419 Anxiety disorder, unspecified: Secondary | ICD-10-CM

## 2016-09-29 ENCOUNTER — Telehealth: Payer: Self-pay | Admitting: Family

## 2016-09-29 NOTE — Telephone Encounter (Signed)
Pharmacy must have filled them. PT's A1C and potassium were at goal last lab work. PT can continue not to take.

## 2016-09-29 NOTE — Telephone Encounter (Signed)
Please review.  Notes on dicontinuing medications?

## 2016-09-30 NOTE — Telephone Encounter (Signed)
Pt aware.

## 2016-10-03 ENCOUNTER — Encounter: Payer: Self-pay | Admitting: Pharmacist

## 2016-10-03 ENCOUNTER — Ambulatory Visit (INDEPENDENT_AMBULATORY_CARE_PROVIDER_SITE_OTHER): Payer: Medicare HMO | Admitting: Pharmacist

## 2016-10-03 VITALS — BP 126/76 | HR 70 | Ht 65.0 in | Wt 205.0 lb

## 2016-10-03 DIAGNOSIS — Z0000 Encounter for general adult medical examination without abnormal findings: Secondary | ICD-10-CM | POA: Diagnosis not present

## 2016-10-03 DIAGNOSIS — M8589 Other specified disorders of bone density and structure, multiple sites: Secondary | ICD-10-CM

## 2016-10-03 DIAGNOSIS — E1149 Type 2 diabetes mellitus with other diabetic neurological complication: Secondary | ICD-10-CM

## 2016-10-03 MED ORDER — SITAGLIPTIN PHOSPHATE 100 MG PO TABS: 100.0000 mg | ORAL_TABLET | Freq: Every day | ORAL | 2 refills | Status: DC

## 2016-10-03 NOTE — Patient Instructions (Addendum)
  [name] , Thank you for taking time to come for your Medicare Wellness Visit. I appreciate your ongoing commitment to your health goals. Please review the following plan we discussed and let me know if I can assist you in the future.   These are the goals we discussed:  Stop glipizide Restart Januvia 100mg  - take 1 tablet daily.    Blood Glucose Goals Prior to meals = 80 - 130 Within 2 hours of the start of a meal = less than 180  If you have blood glucose reading less than 80 or over 200 call office [phone] for medication adjustment.    Goals    . Exercise 3x per week (30 min per time)          Walk for 30 minutes 3 times week            Increase non-starchy vegetables - carrots, green bean, squash, zucchini, tomatoes, onions, peppers, spinach and other green leafy vegetables, cabbage, lettuce, cucumbers, asparagus, okra (not fried), eggplant Limit sugar and processed foods (cakes, cookies, ice cream, crackers and chips) Increase fresh fruit but limit serving sizes 1/2 cup or about the size of tennis or baseball Limit red meat to no more than 1-2 times per week (serving size about the size of your palm) Choose whole grains / lean proteins - whole wheat bread, quinoa, whole grain rice (1/2 cup), fish, chicken, [place] Avoid sugar and calorie containing beverages - soda, sweet tea and juice.  Choose water or unsweetened tea instead.  This is a list of the screening recommended for you and due dates:  Health Maintenance  Topic Date Due  . DEXA scan (bone density measurement)  10/22/2016 - order sent to have done in late August  . Eye exam for diabetics  07/02/2016 - we will request records from Dr Rona Ravens  . Flu Shot  09/28/2016  . Hemoglobin A1C  02/18/2017  . Complete foot exam   05/19/2017  . Urine Protein Check  05/19/2017  . Mammogram  03/30/2018  . Tetanus Vaccine  09/06/2022  . Colon Cancer Screening  07/02/2025  .  Hepatitis C: One time screening is recommended  by Center for Disease Control  (CDC) for  adults born from [birth year] through 1967.   Completed  . Pneumonia vaccines  Completed

## 2016-10-04 NOTE — Progress Notes (Signed)
Patient ID: Denise Gardner, female   DOB: 11-Jun-1945, 72 y.o.   MRN: [id number]    Subjective:   Denise Gardner is a 71 y.o. female who presents for a subsequent Medicare Annual Wellness Visit.  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 No children or pets Patient doesn't drive Alcohol/Tobacco/Substances: none   Also here to check BG.  HBG ranges from 130.  AM 60 to 90.  Patient denies hypoglycemia requiring EMS but has had need of EMS due to hypoglycemia several months ago. At our last visit we had changed glipizide to only 1 tablet qam but today patient reports that she is taking 3 tablets again She has not been taking Januvia  Current Medications (verified) Outpatient Encounter Prescriptions as of 10/03/2016  Medication Sig  . ACCU-CHEK SOFTCLIX LANCETS lancets USE TO CHECK BLOOG GLUCOSE TWICE DAILY  . amLODipine (NORVASC) 10 MG tablet TAKE 1 TABLET(10 MG) BY MOUTH DAILY  . aspirin 81 MG chewable tablet Chew 81 mg by mouth daily.  . blood glucose meter kit and supplies KIT Dispense based on patient & insurance preference. Use to check BG up to BID (Dx: type 2 diabete, no insulin E11.49)  . Blood Pressure Monitoring (HEALTH SENSE BP MONITOR) DEVI 1 each by Does not apply route daily. Use to check BP daily.  (Dx: hypertension I 10.0)  . cholecalciferol (VITAMIN D) 400 UNITS TABS Take by mouth. VITAMIN D3 daily   . diazepam (VALIUM) 5 MG tablet Take 1 tablet (5 mg total) by mouth every 12 (twelve) hours as needed.  . diclofenac sodium (VOLTAREN) 1 % GEL Apply 2 g topically 4 (four) times daily.  Marland Kitchen escitalopram (LEXAPRO) 10 MG tablet TAKE 1 TABLET BY MOUTH EVERY DAY  . fluticasone (FLONASE) 50 MCG/ACT nasal spray SHAKE LIQUID AND USE 2 SPRAYS IN EACH NOSTRIL  DAILY  . gabapentin (NEURONTIN) 300 MG capsule TAKE 1 CAPSULE BY MOUTH THREE TIMES DAILY  . ipratropium-albuterol (DUONEB) 0.5-2.5 (3) MG/3ML SOLN USE 1 VIAL VIA NEBULIZER EVERY 6 HOURS  . isosorbide mononitrate (IMDUR) 60 MG 24 hr tablet Take 1 tablet (60 mg total) by mouth every morning.  Marland Kitchen levothyroxine (SYNTHROID) 88 MCG tablet Take 1 tablet (88 mcg total) by mouth daily.  . metFORMIN (GLUCOPHAGE) 1000 MG tablet TAKE 1 TABLET BY MOUTH TWICE DAILY WITH A MEAL  . metoprolol tartrate (LOPRESSOR) 25 MG tablet TAKE 1 TABLET BY MOUTH TWICE DAILY  . misoprostol (CYTOTEC) 200 MCG tablet TAKE 1 TABLET(200 MCG) BY MOUTH FOUR TIMES DAILY  . montelukast (SINGULAIR) 10 MG tablet TAKE 1 TABLET BY MOUTH EVERY NIGHT AT BEDTIME  . naproxen (NAPROSYN) 500 MG tablet TAKE 1 TABLET(500 MG) BY MOUTH TWICE DAILY WITH A MEAL  . Nitroglycerin 400 MCG/SPRAY AERS U UTD FOR CHEST PAIN  . omeprazole (PRILOSEC) 20 MG capsule TAKE 1 CAPSULE(20 MG) BY MOUTH TWICE DAILY BEFORE A MEAL  . ONE TOUCH ULTRA TEST test strip USE TO CHECK BLOOD GLUCOSE BID  . raloxifene (EVISTA) 60 MG tablet TAKE 1 TABLET(60 MG) BY MOUTH DAILY  . VASCEPA 1 g CAPS TAKE 4 CAPSULES BY MOUTH EVERY DAY  . [DISCONTINUED] glipiZIDE (GLUCOTROL XL) 5 MG 24 hr tablet TAKE 2 TABLETS BY MOUTH EVERY MORNING AND 1 TABLET EVERY EVENING  . [DISCONTINUED] ipratropium-albuterol (DUONEB) 0.5-2.5 (3) MG/3ML SOLN  USE 1 VIAL VIA NEBULIZER EVERY 6 HOURS  . [DISCONTINUED] isosorbide mononitrate (IMDUR) 60 MG 24 hr tablet TAKE 1 TABLET BY MOUTH EVERY MORNING  . [DISCONTINUED] omeprazole (PRILOSEC) 20 MG capsule TAKE 1 CAPSULE(20 MG) BY MOUTH TWICE DAILY BEFORE A MEAL  . sitaGLIPtin (JANUVIA) 100 MG tablet Take 1 tablet (100 mg total) by mouth daily.  . [DISCONTINUED] aspirin EC 325 MG tablet Take 1 tablet (325 mg total) by mouth daily. (Patient not taking: Reported on 10/03/2016)  . [DISCONTINUED] JANUVIA 100 MG tablet TAKE 1 TABLET BY MOUTH EVERY DAY (Patient not taking:  Reported on 10/03/2016)   No facility-administered encounter medications on file as of 10/03/2016.     Allergies (verified) Milk-related compounds; Eggs or egg-derived products; Other; Crestor [rosuvastatin]; and Zetia [ezetimibe]   History: Past Medical History:  Diagnosis Date  . Anxiety    depression  . Asthma   . Cataract   . Chronic back pain   . Coronary artery disease    Taxus stent to RCA 2008 2.5 x 16, non obstructive 15% proximal right coronary artery, patent curcumflex LAD, preserved LV  . Diabetes mellitus without complication ([place])   . GERD (gastroesophageal reflux disease)   . Hyperlipidemia   . Hypertension   . Insomnia   . Migraine   . Neuropathy   . Shingles   . Thyroid disease   . Ulcer, stomach peptic    Past Surgical History:  Procedure Laterality Date  . TOTAL ABDOMINAL HYSTERECTOMY     Family History  Problem Relation Age of Onset  . Stroke Mother 20  . Heart disease Mother   . Coronary artery disease Brother 47  . Diabetes Brother   . Heart disease Brother   . Coronary artery disease Brother 68  . Diabetes Brother   . Heart disease Brother   . Cancer Sister        breast  . Heart disease Sister   . Diabetes Sister   . Heart disease Sister   . Diabetes Sister   . Heart disease Sister   . Diabetes Sister   . Heart disease Sister   . Diabetes Brother   . Heart disease Brother   . Heart attack Brother   . Heart disease Brother   . Heart disease Brother   . Heart disease Brother    Social History   Occupational History  . retired     Equities trader    Social History Main Topics  . Smoking status: Never Smoker  . Smokeless tobacco: Never Used  . Alcohol use No  . Drug use: No  . Sexual activity: No    Do you feel safe at home?  Yes Are there smokers in your home (other than you)? No  Dietary issues and exercise activities: Current Exercise Habits: Home exercise routine, Type of exercise: walking, Time (Minutes): 20, Frequency  (Times/Week): 6, Weekly Exercise (Minutes/Week): 120, Intensity: Mild  Current Dietary habits:  Eats 2 to 3 meals per day.  Reports eating peanut butter for snack during day.  Low salt diet.    Objective:    Today's Vitals   10/03/16 1124  BP: 126/76  Pulse: 70  Weight: 205 lb (93 kg)  Height: '5\' 5"'  (1.651 m)  PainSc: 8    Body mass index is 34.11 kg/m.  Activities of Daily Living In your present state of health, do you have any difficulty performing the following activities: 10/03/2016 11/25/2015  Hearing? N N  Vision? Aggie Moats  Difficulty concentrating or making decisions? Tempie Donning  Walking or climbing stairs? N Y  Comment - Uses a cane  Dressing or bathing? N N  Doing errands, shopping? Y N  Comment patient does not drive - needs help with errands -  Preparing Food and eating ? N N  Using the Toilet? N N  In the past six months, have you accidently leaked urine? N Y  Comment - some urge incontinence  Do you have problems with loss of bowel control? N N  Managing your Medications? N N  Managing your Finances? N N  Housekeeping or managing your Housekeeping? Y N  Some recent data might be hidden     Cardiac Risk Factors include: advanced age (>70mn, >>2women);diabetes mellitus;dyslipidemia;family history of premature cardiovascular disease;obesity (BMI >30kg/m2)  Depression Screen PHQ 2/9 Scores 10/03/2016 08/19/2016 05/19/2016 02/18/2016  PHQ - 2 Score 1 0 0 1     Fall Risk Fall Risk  10/03/2016 08/19/2016 05/19/2016 02/18/2016 11/25/2015  Falls in the past year? No Yes No Yes Yes  Number falls in past yr: - 2 or more - 1 1  Injury with Fall? - No - No No  Comment - - - - -  Risk for fall due to : - - - - History of fall(s);Impaired balance/gait;Medication side effect  Follow up - - - - Falls prevention discussed    Cognitive Function: MMSE - Mini Mental State Exam 10/03/2016 11/25/2015  Orientation to time 5 5  Orientation to Place 5 5  Registration 2 3  Attention/ Calculation  4 5  Recall 2 3  Language- name 2 objects 2 2  Language- repeat 1 1  Language- follow 3 step command 2 3  Language- read & follow direction 1 1  Write a sentence 1 1  Copy design 1 1  Total score 26 30    Immunizations and Health Maintenance Immunization History  Administered Date(s) Administered  . Influenza, Quadrivalent, Recombinant, Inj, Pf 03/30/2016  . Pneumococcal Conjugate-13 12/20/2013  . Pneumococcal Polysaccharide-23 10/22/2010  . Tdap 09/05/2012   Health Maintenance Due  Topic Date Due  . DEXA SCAN  06/07/2014  . OPHTHALMOLOGY EXAM  07/02/2016  . INFLUENZA VACCINE  09/28/2016    Patient Care Team: HSharion Balloon FNP as PCP - General (Nurse Practitioner) HMinus Breeding MD as Attending Physician (Cardiology) TMelina Schools OD (Optometry)  Indicate any recent Medical Services you may have received from other than [facility] providers in the past year (date may be approximate).    Assessment:    Annual Wellness Visit  Type 2 DM - A1c is at goal but having episodes of hypoglcyemia   Screening Tests Health Maintenance  Topic Date Due  . DEXA SCAN  06/07/2014  . OPHTHALMOLOGY EXAM  07/02/2016  . INFLUENZA VACCINE  09/28/2016  . HEMOGLOBIN A1C  02/18/2017  . FOOT EXAM  05/19/2017  . URINE MICROALBUMIN  05/19/2017  . MAMMOGRAM  03/30/2018  . TETANUS/TDAP  09/06/2022  . COLONOSCOPY  07/02/2025  . Hepatitis C Screening  Completed  . PNA vac Low Risk Adult  Completed        Plan:   During the course of the visit TAyshawas educated and counseled about the following appropriate screening and preventive services:   Vaccines to include Pneumoccal, Influenza, Td and Shingles  Colorectal cancer screening  Cardiovascular disease screening - LDL above goal but patient has history of intolerance to statins.  Diabetes - recommend restart januvia 1060m  qd. D/C glipizide due to hypoglycemia  Bone Denisty / Osteoporosis Screening - due in about 2 weeks - order  sent  Mammogram  PAP  Glaucoma screening / Diabetic Eye Exam - done will request records  Nutrition counseling  Smoking cessation counseling  Advanced Directives  Physical Activity  Orders Placed This Encounter  Procedures  . DG [facility] DEXA    Standing Status:   Future    Standing Expiration Date:   12/03/2017    Scheduling Instructions:     Last DEXA 10/23/2014 - next due after 10/22/2016    Order Specific Question:   Reason for Exam (SYMPTOM  OR DIAGNOSIS REQUIRED)    Answer:   osteopenia    Goals    . Exercise 3x per week (30 min per time)          Walk for 30 minutes at least 3 times week  - great job!          Patient Instructions (the written plan) were given to the patient.   Cherre Robins, PharmD   10/04/2016

## 2016-10-13 ENCOUNTER — Ambulatory Visit (INDEPENDENT_AMBULATORY_CARE_PROVIDER_SITE_OTHER): Payer: Medicare HMO

## 2016-10-13 DIAGNOSIS — M8588 Other specified disorders of bone density and structure, other site: Secondary | ICD-10-CM

## 2016-10-13 DIAGNOSIS — M8589 Other specified disorders of bone density and structure, multiple sites: Secondary | ICD-10-CM

## 2016-10-17 ENCOUNTER — Other Ambulatory Visit: Payer: Self-pay | Admitting: Family

## 2016-10-25 ENCOUNTER — Other Ambulatory Visit: Payer: Self-pay | Admitting: Family

## 2016-10-26 DIAGNOSIS — R69 Illness, unspecified: Secondary | ICD-10-CM | POA: Diagnosis not present

## 2016-10-30 DIAGNOSIS — R69 Illness, unspecified: Secondary | ICD-10-CM | POA: Diagnosis not present

## 2016-11-18 ENCOUNTER — Other Ambulatory Visit: Payer: Self-pay | Admitting: Family

## 2016-11-18 DIAGNOSIS — M8580 Other specified disorders of bone density and structure, unspecified site: Secondary | ICD-10-CM

## 2016-11-21 ENCOUNTER — Ambulatory Visit (INDEPENDENT_AMBULATORY_CARE_PROVIDER_SITE_OTHER): Payer: Medicare HMO | Admitting: Family

## 2016-11-21 ENCOUNTER — Encounter: Payer: Self-pay | Admitting: Family

## 2016-11-21 VITALS — BP 139/79 | HR 80 | Temp 97.6°F | Ht 65.0 in | Wt 201.4 lb

## 2016-11-21 DIAGNOSIS — E1169 Type 2 diabetes mellitus with other specified complication: Secondary | ICD-10-CM

## 2016-11-21 DIAGNOSIS — K219 Gastro-esophageal reflux disease without esophagitis: Secondary | ICD-10-CM

## 2016-11-21 DIAGNOSIS — K5900 Constipation, unspecified: Secondary | ICD-10-CM

## 2016-11-21 DIAGNOSIS — J45909 Unspecified asthma, uncomplicated: Secondary | ICD-10-CM

## 2016-11-21 DIAGNOSIS — J449 Chronic obstructive pulmonary disease, unspecified: Secondary | ICD-10-CM

## 2016-11-21 DIAGNOSIS — I10 Essential (primary) hypertension: Secondary | ICD-10-CM

## 2016-11-21 DIAGNOSIS — E785 Hyperlipidemia, unspecified: Secondary | ICD-10-CM

## 2016-11-21 DIAGNOSIS — E1142 Type 2 diabetes mellitus with diabetic polyneuropathy: Secondary | ICD-10-CM | POA: Diagnosis not present

## 2016-11-21 DIAGNOSIS — E039 Hypothyroidism, unspecified: Secondary | ICD-10-CM

## 2016-11-21 DIAGNOSIS — F331 Major depressive disorder, recurrent, moderate: Secondary | ICD-10-CM

## 2016-11-21 DIAGNOSIS — F419 Anxiety disorder, unspecified: Secondary | ICD-10-CM

## 2016-11-21 DIAGNOSIS — R69 Illness, unspecified: Secondary | ICD-10-CM | POA: Diagnosis not present

## 2016-11-21 LAB — BAYER DCA HB A1C WAIVED: HB A1C (BAYER DCA - WAIVED): 6 % (ref <7.0)

## 2016-11-21 MED ORDER — DIAZEPAM 5 MG PO TABS: 5.0000 mg | ORAL_TABLET | Freq: Two times a day (BID) | ORAL | 5 refills | Status: DC | PRN

## 2016-11-21 NOTE — Patient Instructions (Signed)

## 2016-11-21 NOTE — Progress Notes (Signed)
Subjective:    Patient ID: Denise Gardner, female    DOB: 05-27-1945, 71 y.o.   MRN: [id number]  PT presents to the office today for chronic follow up.  Diabetes  She presents for her follow-up diabetic visit. She has type 2 diabetes mellitus. Her disease course has been stable. Hypoglycemia symptoms include nervousness/anxiousness. Associated symptoms include fatigue and foot paresthesias. Pertinent negatives for diabetes include no blurred vision and no visual change. There are no hypoglycemic complications. Symptoms are worsening. Diabetic complications include nephropathy. Pertinent negatives for diabetic complications include no CVA or heart disease. Risk factors for coronary artery disease include diabetes mellitus, dyslipidemia, obesity, hypertension and sedentary lifestyle. She is following a generally healthy diet. Her breakfast blood glucose range is generally 140-180 mg/dl. Eye exam is current.  Asthma  She complains of hoarse voice, shortness of breath ("when I walk a lot") and wheezing. There is no cough. The current episode started more than 1 year ago. The problem has been waxing and waning. Associated symptoms include heartburn, malaise/fatigue, nasal congestion and rhinorrhea. Pertinent negatives include no sore throat. She reports moderate improvement on treatment. Her past medical history is significant for asthma.  Hypertension  This is a chronic problem. The current episode started more than 1 year ago. The problem has been resolved since onset. The problem is controlled. Associated symptoms include anxiety, malaise/fatigue, peripheral edema and shortness of breath ("when I walk a lot"). Pertinent negatives include no blurred vision. The current treatment provides moderate improvement. Hypertensive end-organ damage includes CAD/MI. There is no history of CVA or heart failure. Identifiable causes of hypertension include a thyroid problem.  Gastroesophageal Reflux  She complains of  heartburn, a hoarse voice and wheezing. She reports no belching, no coughing, no nausea or no sore throat. This is a chronic problem. The current episode started more than 1 year ago. The problem occurs occasionally. The problem has been rapidly improving. The symptoms are aggravated by bending, certain foods and lying down. Associated symptoms include fatigue. Risk factors include obesity. She has tried a PPI for the symptoms. The treatment provided moderate relief.  Thyroid Problem  Presents for follow-up visit. Symptoms include anxiety, constipation, depressed mood, dry skin, fatigue and hoarse voice. Patient reports no diarrhea or visual change. The symptoms have been stable. Her past medical history is significant for hyperlipidemia. There is no history of heart failure.  Constipation  This is a chronic problem. The current episode started more than 1 year ago. The problem has been resolved since onset. Her stool frequency is 4 to 5 times per week. Pertinent negatives include no diarrhea, nausea or vomiting. Risk factors include obesity. She has tried laxatives for the symptoms. The treatment provided moderate relief.  Hyperlipidemia  This is a chronic problem. The current episode started more than 1 year ago. The problem is uncontrolled. Recent lipid tests were reviewed and are high. Exacerbating diseases include obesity. Associated symptoms include shortness of breath ("when I walk a lot"). The current treatment provides moderate improvement of lipids.  Depression         This is a chronic problem.  The current episode started more than 1 year ago.   The onset quality is gradual.   The problem occurs intermittently.  The problem has been waxing and waning since onset.  Associated symptoms include fatigue, restlessness, decreased interest and sad.  Associated symptoms include no helplessness, no hopelessness and not irritable.  Past treatments include SSRIs - Selective serotonin reuptake inhibitors.  Previous treatment provided mild relief.  Past medical history includes thyroid problem and anxiety.   Anxiety  Presents for follow-up visit. Symptoms include depressed mood, excessive worry, irritability, nervous/anxious behavior, restlessness and shortness of breath ("when I walk a lot"). Patient reports no nausea. Symptoms occur occasionally. The severity of symptoms is moderate. The quality of sleep is good.   Her past medical history is significant for asthma.  Dysuria   This is a new problem. The current episode started 1 to 4 weeks ago. The problem occurs intermittently. The problem has been waxing and waning. The quality of the pain is described as burning. The pain is mild. Associated symptoms include frequency and urgency. Pertinent negatives include no nausea or vomiting.  Diabetic Neuropathy Pt taking gabapentin 300 mg TID. Pt has intermittent 10 out 10 that is worse at night.  COPD  Pt does not currently taking any inhalers. States "I don't think they help me".    Review of Systems  Constitutional: Positive for fatigue, irritability and malaise/fatigue.  HENT: Positive for hoarse voice and rhinorrhea. Negative for sore throat.   Eyes: Negative for blurred vision.  Respiratory: Positive for shortness of breath ("when I walk a lot") and wheezing. Negative for cough.   Gastrointestinal: Positive for constipation and heartburn. Negative for diarrhea, nausea and vomiting.  Genitourinary: Positive for dysuria, frequency and urgency.  Psychiatric/Behavioral: Positive for depression. The patient is nervous/anxious.   All other systems reviewed and are negative.      Objective:   Physical Exam  Constitutional: She is oriented to person, place, and time. She appears well-developed and well-nourished. She is not irritable. No distress.  HENT:  Head: Normocephalic and atraumatic.  Right Ear: External ear normal.  Mouth/Throat: Oropharynx is clear and moist.  Eyes: Pupils are equal,  round, and reactive to light.  Neck: Normal range of motion. Neck supple. No thyromegaly present.  Cardiovascular: Normal rate, regular rhythm, normal heart sounds and intact distal pulses.   No murmur heard. Pulmonary/Chest: Effort normal and breath sounds normal. No respiratory distress. She has no wheezes.  Abdominal: Soft. Bowel sounds are normal. She exhibits no distension. There is no tenderness.  Musculoskeletal: Normal range of motion. She exhibits no tenderness. Edema: trace BLE.  Neurological: She is alert and oriented to person, place, and time.  Skin: Skin is warm and dry.  Psychiatric: She has a normal mood and affect. Her behavior is normal. Judgment and thought content normal.  Vitals reviewed.     BP 139/79   Pulse 80   Temp 97.6 F (36.4 C) (Oral)   Ht '5\' 5"'  (1.651 m)   Wt 201 lb 6.4 oz (91.4 kg)   BMI 33.51 kg/m      Assessment & Plan:  1. Essential hypertension - CMP14+EGFR  2. Uncomplicated asthma, unspecified asthma severity, unspecified whether persistent - CMP14+EGFR  3. Gastroesophageal reflux disease, esophagitis presence not specified - CMP14+EGFR  4. Chronic obstructive pulmonary disease, unspecified COPD type ([place]) - CMP14+EGFR  5. Constipation, unspecified constipation type - CMP14+EGFR  6. Diabetic polyneuropathy associated with type 2 diabetes mellitus (HCC) - CMP14+EGFR  7. Hypothyroidism, unspecified type - CMP14+EGFR  8. Type 2 diabetes mellitus with diabetic polyneuropathy, without long-term current use of insulin (HCC) - Bayer DCA Hb A1c Waived - CMP14+EGFR  9. Anxiety - CMP14+EGFR - diazepam (VALIUM) 5 MG tablet; Take 1 tablet (5 mg total) by mouth every 12 (twelve) hours as needed.  Dispense: 60 tablet; Refill: 5  10. Hyperlipidemia, unspecified  hyperlipidemia type - CMP14+EGFR  11. Moderate episode of recurrent major depressive disorder (HCC)  - CMP14+EGFR  12. Hyperlipidemia associated with type 2 diabetes mellitus  ([place])   Continue all meds Labs pending Health Maintenance reviewed Diet and exercise encouraged RTO 3 months   Evelina Dun, FNP

## 2016-11-22 LAB — CMP14+EGFR
ALT: 9 IU/L (ref 0–32)
AST: 14 IU/L (ref 0–40)
Albumin/Globulin Ratio: 1.8 (ref 1.2–2.2)
Albumin: 4.2 g/dL (ref 3.5–4.8)
Alkaline Phosphatase: 39 IU/L (ref 39–117)
BUN/Creatinine Ratio: 15 (ref 12–28)
BUN: 13 mg/dL (ref 8–27)
Bilirubin Total: 0.2 mg/dL (ref 0.0–1.2)
CO2: 24 mmol/L (ref 20–29)
Calcium: 9.8 mg/dL (ref 8.7–10.3)
Chloride: 104 mmol/L (ref 96–106)
Creatinine, Ser: 0.87 mg/dL (ref 0.57–1.00)
GFR calc Af Amer: 76 mL/min/{1.73_m2} (ref >59)
GFR calc non Af Amer: 66 mL/min/{1.73_m2} (ref >59)
Globulin, Total: 2.4 g/dL (ref 1.5–4.5)
Glucose: 113 mg/dL — ABNORMAL HIGH (ref 65–99)
Potassium: 4.7 mmol/L (ref 3.5–5.2)
Sodium: 141 mmol/L (ref 134–144)
Total Protein: 6.6 g/dL (ref 6.0–8.5)

## 2016-11-25 DIAGNOSIS — R69 Illness, unspecified: Secondary | ICD-10-CM | POA: Diagnosis not present

## 2016-12-13 DIAGNOSIS — R69 Illness, unspecified: Secondary | ICD-10-CM | POA: Diagnosis not present

## 2016-12-25 ENCOUNTER — Other Ambulatory Visit: Payer: Self-pay | Admitting: Family

## 2016-12-25 DIAGNOSIS — F419 Anxiety disorder, unspecified: Secondary | ICD-10-CM

## 2016-12-25 DIAGNOSIS — F32A Depression, unspecified: Secondary | ICD-10-CM

## 2016-12-25 DIAGNOSIS — F329 Major depressive disorder, single episode, unspecified: Secondary | ICD-10-CM

## 2016-12-28 ENCOUNTER — Ambulatory Visit (INDEPENDENT_AMBULATORY_CARE_PROVIDER_SITE_OTHER): Payer: Medicare HMO

## 2016-12-28 DIAGNOSIS — Z23 Encounter for immunization: Secondary | ICD-10-CM

## 2017-01-18 ENCOUNTER — Other Ambulatory Visit: Payer: Self-pay | Admitting: Family

## 2017-01-18 DIAGNOSIS — F32A Depression, unspecified: Secondary | ICD-10-CM

## 2017-01-18 DIAGNOSIS — F329 Major depressive disorder, single episode, unspecified: Secondary | ICD-10-CM

## 2017-01-18 DIAGNOSIS — F419 Anxiety disorder, unspecified: Secondary | ICD-10-CM

## 2017-01-18 DIAGNOSIS — K5900 Constipation, unspecified: Secondary | ICD-10-CM

## 2017-01-26 ENCOUNTER — Other Ambulatory Visit: Payer: Self-pay | Admitting: Family

## 2017-01-26 ENCOUNTER — Telehealth: Payer: Self-pay | Admitting: Family

## 2017-01-26 DIAGNOSIS — J4520 Mild intermittent asthma, uncomplicated: Secondary | ICD-10-CM

## 2017-01-26 DIAGNOSIS — R69 Illness, unspecified: Secondary | ICD-10-CM | POA: Diagnosis not present

## 2017-01-26 DIAGNOSIS — M8580 Other specified disorders of bone density and structure, unspecified site: Secondary | ICD-10-CM

## 2017-01-26 MED ORDER — NITROGLYCERIN 0.4 MG SL SUBL: 0.4000 mg | SUBLINGUAL_TABLET | SUBLINGUAL | 3 refills | Status: DC | PRN

## 2017-01-26 NOTE — Telephone Encounter (Signed)
Covering PCP, please advise.  

## 2017-01-27 DIAGNOSIS — R69 Illness, unspecified: Secondary | ICD-10-CM | POA: Diagnosis not present

## 2017-02-04 DIAGNOSIS — R69 Illness, unspecified: Secondary | ICD-10-CM | POA: Diagnosis not present

## 2017-02-24 ENCOUNTER — Ambulatory Visit: Payer: Medicare HMO | Admitting: Family

## 2017-02-24 ENCOUNTER — Encounter: Payer: Self-pay | Admitting: Family

## 2017-02-24 VITALS — BP 129/78 | HR 72 | Temp 97.1°F | Ht 65.0 in | Wt 198.8 lb

## 2017-02-24 DIAGNOSIS — E1169 Type 2 diabetes mellitus with other specified complication: Secondary | ICD-10-CM

## 2017-02-24 DIAGNOSIS — I10 Essential (primary) hypertension: Secondary | ICD-10-CM | POA: Diagnosis not present

## 2017-02-24 DIAGNOSIS — J45909 Unspecified asthma, uncomplicated: Secondary | ICD-10-CM | POA: Diagnosis not present

## 2017-02-24 DIAGNOSIS — E039 Hypothyroidism, unspecified: Secondary | ICD-10-CM | POA: Diagnosis not present

## 2017-02-24 DIAGNOSIS — F419 Anxiety disorder, unspecified: Secondary | ICD-10-CM | POA: Diagnosis not present

## 2017-02-24 DIAGNOSIS — E1159 Type 2 diabetes mellitus with other circulatory complications: Secondary | ICD-10-CM

## 2017-02-24 DIAGNOSIS — R69 Illness, unspecified: Secondary | ICD-10-CM | POA: Diagnosis not present

## 2017-02-24 DIAGNOSIS — I2510 Atherosclerotic heart disease of native coronary artery without angina pectoris: Secondary | ICD-10-CM | POA: Diagnosis not present

## 2017-02-24 DIAGNOSIS — K219 Gastro-esophageal reflux disease without esophagitis: Secondary | ICD-10-CM | POA: Diagnosis not present

## 2017-02-24 DIAGNOSIS — E785 Hyperlipidemia, unspecified: Secondary | ICD-10-CM | POA: Diagnosis not present

## 2017-02-24 DIAGNOSIS — F331 Major depressive disorder, recurrent, moderate: Secondary | ICD-10-CM | POA: Diagnosis not present

## 2017-02-24 DIAGNOSIS — E1142 Type 2 diabetes mellitus with diabetic polyneuropathy: Secondary | ICD-10-CM | POA: Diagnosis not present

## 2017-02-24 DIAGNOSIS — I152 Hypertension secondary to endocrine disorders: Secondary | ICD-10-CM

## 2017-02-24 LAB — BAYER DCA HB A1C WAIVED: HB A1C (BAYER DCA - WAIVED): 5.9 % (ref <7.0)

## 2017-02-24 NOTE — Patient Instructions (Signed)
Diabetes Mellitus and Nutrition When you have diabetes (diabetes mellitus), it is very important to have healthy eating habits because your blood sugar (glucose) levels are greatly affected by what you eat and drink. Eating healthy foods in the appropriate amounts, at about the same times every day, can help you:  Control your blood glucose.  Lower your risk of heart disease.  Improve your blood pressure.  Reach or maintain a healthy weight.  Every person with diabetes is different, and each person has different needs for a meal plan. Your health care provider may recommend that you work with a diet and nutrition specialist (dietitian) to make a meal plan that is best for you. Your meal plan may vary depending on factors such as:  The calories you need.  The medicines you take.  Your weight.  Your blood glucose, blood pressure, and cholesterol levels.  Your activity level.  Other health conditions you have, such as heart or kidney disease.  How do carbohydrates affect me? Carbohydrates affect your blood glucose level more than any other type of food. Eating carbohydrates naturally increases the amount of glucose in your blood. Carbohydrate counting is a method for keeping track of how many carbohydrates you eat. Counting carbohydrates is important to keep your blood glucose at a healthy level, especially if you use insulin or take certain oral diabetes medicines. It is important to know how many carbohydrates you can safely have in each meal. This is different for every person. Your dietitian can help you calculate how many carbohydrates you should have at each meal and for snack. Foods that contain carbohydrates include:  Bread, cereal, rice, pasta, and crackers.  Potatoes and corn.  Peas, beans, and lentils.  Milk and yogurt.  Fruit and juice.  Desserts, such as cakes, cookies, ice cream, and candy.  How does alcohol affect me? Alcohol can cause a sudden decrease in blood  glucose (hypoglycemia), especially if you use insulin or take certain oral diabetes medicines. Hypoglycemia can be a life-threatening condition. Symptoms of hypoglycemia (sleepiness, dizziness, and confusion) are similar to symptoms of having too much alcohol. If your health care provider says that alcohol is safe for you, follow these guidelines:  Limit alcohol intake to no more than 1 drink per day for nonpregnant women and 2 drinks per day for men. One drink equals 12 oz of beer, 5 oz of wine, or 1 oz of hard liquor.  Do not drink on an empty stomach.  Keep yourself hydrated with water, diet soda, or unsweetened iced tea.  Keep in mind that regular soda, juice, and other mixers may contain a lot of sugar and must be counted as carbohydrates.  What are tips for following this plan? Reading food labels  Start by checking the serving size on the label. The amount of calories, carbohydrates, fats, and other nutrients listed on the label are based on one serving of the food. Many foods contain more than one serving per package.  Check the total grams (g) of carbohydrates in one serving. You can calculate the number of servings of carbohydrates in one serving by dividing the total carbohydrates by 15. For example, if a food has 30 g of total carbohydrates, it would be equal to 2 servings of carbohydrates.  Check the number of grams (g) of saturated and trans fats in one serving. Choose foods that have low or no amount of these fats.  Check the number of milligrams (mg) of sodium in one serving. Most people   should limit total sodium intake to less than 2,300 mg per day.  Always check the nutrition information of foods labeled as "low-fat" or "nonfat". These foods may be higher in added sugar or refined carbohydrates and should be avoided.  Talk to your dietitian to identify your daily goals for nutrients listed on the label. Shopping  Avoid buying canned, premade, or processed foods. These  foods tend to be high in fat, sodium, and added sugar.  Shop around the outside edge of the grocery store. This includes fresh fruits and vegetables, bulk grains, fresh meats, and fresh dairy. Cooking  Use low-heat cooking methods, such as baking, instead of high-heat cooking methods like deep frying.  Cook using healthy oils, such as olive, canola, or sunflower oil.  Avoid cooking with butter, cream, or high-fat meats. Meal planning  Eat meals and snacks regularly, preferably at the same times every day. Avoid going long periods of time without eating.  Eat foods high in fiber, such as fresh fruits, vegetables, beans, and whole grains. Talk to your dietitian about how many servings of carbohydrates you can eat at each meal.  Eat 4-6 ounces of lean protein each day, such as lean meat, chicken, fish, eggs, or tofu. 1 ounce is equal to 1 ounce of meat, chicken, or fish, 1 egg, or 1/4 cup of tofu.  Eat some foods each day that contain healthy fats, such as avocado, nuts, seeds, and fish. Lifestyle   Check your blood glucose regularly.  Exercise at least 30 minutes 5 or more days each week, or as told by your health care provider.  Take medicines as told by your health care provider.  Do not use any products that contain nicotine or tobacco, such as cigarettes and e-cigarettes. If you need help quitting, ask your health care provider.  Work with a counselor or diabetes educator to identify strategies to manage stress and any emotional and social challenges. What are some questions to ask my health care provider?  Do I need to meet with a diabetes educator?  Do I need to meet with a dietitian?  What number can I call if I have questions?  When are the best times to check my blood glucose? Where to find more information:  American Diabetes Association: diabetes.org/food-and-fitness/food  Academy of Nutrition and Dietetics:  www.eatright.org/resources/health/diseases-and-conditions/diabetes  National Institute of Diabetes and Digestive and Kidney Diseases (NIH): www.niddk.nih.gov/health-information/diabetes/overview/diet-eating-physical-activity Summary  A healthy meal plan will help you control your blood glucose and maintain a healthy lifestyle.  Working with a diet and nutrition specialist (dietitian) can help you make a meal plan that is best for you.  Keep in mind that carbohydrates and alcohol have immediate effects on your blood glucose levels. It is important to count carbohydrates and to use alcohol carefully. This information is not intended to replace advice given to you by your health care provider. Make sure you discuss any questions you have with your health care provider. Document Released: 11/11/2004 Document Revised: 03/21/2016 Document Reviewed: 03/21/2016 Elsevier Interactive Patient Education  2018 Elsevier Inc.  

## 2017-02-24 NOTE — Progress Notes (Signed)
Subjective:    Patient ID: Denise Gardner, female    DOB: 1946-01-26, 71 y.o.   MRN: [id number]  PT presents to the office today for chronic follow up. Diabetes  She presents for her follow-up diabetic visit. She has type 2 diabetes mellitus. Her disease course has been stable. Hypoglycemia symptoms include nervousness/anxiousness. Associated symptoms include fatigue and visual change. Pertinent negatives for diabetes include no blurred vision. There are no hypoglycemic complications. Symptoms are stable. Diabetic complications include nephropathy and peripheral neuropathy. Risk factors for coronary artery disease include dyslipidemia, diabetes mellitus, family history, hypertension, sedentary lifestyle and post-menopausal. Her breakfast blood glucose range is generally 180-200 mg/dl. Eye exam is current.  Asthma  She complains of hoarse voice, shortness of breath and wheezing ("some times"). There is no cough. This is a chronic problem. The current episode started more than 1 year ago. Associated symptoms include heartburn, malaise/fatigue, myalgias and rhinorrhea. Pertinent negatives include no ear congestion or ear pain. Her symptoms are not alleviated by rest. Her past medical history is significant for asthma.  Anxiety  Presents for follow-up visit. Symptoms include excessive worry, irritability, nervous/anxious behavior and shortness of breath. Symptoms occur occasionally.   Her past medical history is significant for asthma.  Thyroid Problem  Presents for follow-up visit. Symptoms include anxiety, constipation, fatigue, hoarse voice and visual change. The symptoms have been stable.  Hypertension  This is a chronic problem. The current episode started more than 1 year ago. The problem has been resolved since onset. The problem is controlled. Associated symptoms include anxiety, malaise/fatigue, peripheral edema and shortness of breath. Pertinent negatives include no blurred vision. Risk factors  for coronary artery disease include diabetes mellitus, dyslipidemia, family history, obesity and sedentary lifestyle. The current treatment provides moderate improvement. Identifiable causes of hypertension include a thyroid problem.  Depression         This is a chronic problem.  The current episode started more than 1 year ago.   The onset quality is gradual.   The problem occurs intermittently.  The problem has been waxing and waning since onset.  Associated symptoms include fatigue, myalgias and sad.  Associated symptoms include no helplessness and no hopelessness.  Past medical history includes thyroid problem and anxiety.   Gastroesophageal Reflux  She complains of heartburn, a hoarse voice and wheezing ("some times"). She reports no coughing. This is a chronic problem. The current episode started more than 1 year ago. The problem occurs occasionally. The problem has been waxing and waning. Associated symptoms include fatigue. She has tried a PPI for the symptoms. The treatment provided moderate relief.  Diabetic Neuropathy Pt currently taking gabapentin 300 mg TID.     Review of Systems  Constitutional: Positive for fatigue, irritability and malaise/fatigue.  HENT: Positive for hoarse voice and rhinorrhea. Negative for ear pain.   Eyes: Negative for blurred vision.  Respiratory: Positive for shortness of breath and wheezing ("some times"). Negative for cough.   Gastrointestinal: Positive for constipation and heartburn.  Musculoskeletal: Positive for myalgias.  Psychiatric/Behavioral: Positive for depression. The patient is nervous/anxious.   All other systems reviewed and are negative.      Objective:   Physical Exam  Constitutional: She is oriented to person, place, and time. She appears well-developed and well-nourished. No distress.  HENT:  Head: Normocephalic and atraumatic.  Right Ear: External ear normal.  Left Ear: External ear normal.  Nose: Nose normal.  Mouth/Throat:  Oropharynx is clear and moist.  Eyes: Pupils are equal,  round, and reactive to light.  Neck: Normal range of motion. Neck supple. No thyromegaly present.  Cardiovascular: Normal rate, regular rhythm, normal heart sounds and intact distal pulses.  No murmur heard. Pulmonary/Chest: Effort normal and breath sounds normal. No respiratory distress. She has no wheezes.  Abdominal: Soft. Bowel sounds are normal. She exhibits no distension. There is no tenderness.  Musculoskeletal: Normal range of motion. She exhibits no edema or tenderness.  Neurological: She is alert and oriented to person, place, and time.  Skin: Skin is warm and dry.  Psychiatric: She has a normal mood and affect. Her behavior is normal. Judgment and thought content normal.  Vitals reviewed.     BP 129/78   Pulse 72   Temp (!) 97.1 F (36.2 C) (Oral)   Ht '5\' 5"'  (1.651 m)   Wt 198 lb 12.8 oz (90.2 kg)   BMI 33.08 kg/m      Assessment & Plan:  1. Hypertension associated with diabetes ([place]) - CMP14+EGFR  2. Chronic coronary artery disease - CMP14+EGFR  3. Uncomplicated asthma, unspecified asthma severity, unspecified whether persistent - CMP14+EGFR  4. Hyperlipidemia associated with type 2 diabetes mellitus (HCC) - CMP14+EGFR - Lipid panel  5. Type 2 diabetes mellitus with diabetic polyneuropathy, without long-term current use of insulin (HCC)  - CMP14+EGFR - Bayer DCA Hb A1c Waived  6. Moderate episode of recurrent major depressive disorder (HCC) - CMP14+EGFR  7. Anxiety - CMP14+EGFR  8. Diabetic polyneuropathy associated with type 2 diabetes mellitus (HCC)  - CMP14+EGFR  9. Hypothyroidism, unspecified type - CMP14+EGFR - TSH  10. Gastroesophageal reflux disease, esophagitis presence not specified  - CMP14+EGFR   Continue all meds Labs pending Health Maintenance reviewed Diet and exercise encouraged RTO 4 months   Evelina Dun, FNP

## 2017-02-25 LAB — CMP14+EGFR
ALT: 11 IU/L (ref 0–32)
AST: 16 IU/L (ref 0–40)
Albumin/Globulin Ratio: 1.9 (ref 1.2–2.2)
Albumin: 4.3 g/dL (ref 3.5–4.8)
Alkaline Phosphatase: 29 IU/L — ABNORMAL LOW (ref 39–117)
BUN/Creatinine Ratio: 20 (ref 12–28)
BUN: 20 mg/dL (ref 8–27)
Bilirubin Total: 0.3 mg/dL (ref 0.0–1.2)
CO2: 23 mmol/L (ref 20–29)
Calcium: 9.5 mg/dL (ref 8.7–10.3)
Chloride: 102 mmol/L (ref 96–106)
Creatinine, Ser: 1.01 mg/dL — ABNORMAL HIGH (ref 0.57–1.00)
GFR calc Af Amer: 64 mL/min/{1.73_m2} (ref >59)
GFR calc non Af Amer: 55 mL/min/{1.73_m2} — ABNORMAL LOW (ref >59)
Globulin, Total: 2.3 g/dL (ref 1.5–4.5)
Glucose: 98 mg/dL (ref 65–99)
Potassium: 4.2 mmol/L (ref 3.5–5.2)
Sodium: 141 mmol/L (ref 134–144)
Total Protein: 6.6 g/dL (ref 6.0–8.5)

## 2017-02-25 LAB — LIPID PANEL
Chol/HDL Ratio: 2.9 ratio (ref 0.0–4.4)
Cholesterol, Total: 166 mg/dL (ref 100–199)
HDL: 57 mg/dL (ref >39)
LDL Calculated: 91 mg/dL (ref 0–99)
Triglycerides: 92 mg/dL (ref 0–149)
VLDL Cholesterol Cal: 18 mg/dL (ref 5–40)

## 2017-02-25 LAB — TSH: TSH: 2.14 u[IU]/mL (ref 0.450–4.500)

## 2017-03-02 ENCOUNTER — Other Ambulatory Visit: Payer: Self-pay | Admitting: Family

## 2017-03-02 DIAGNOSIS — E1149 Type 2 diabetes mellitus with other diabetic neurological complication: Secondary | ICD-10-CM

## 2017-03-02 DIAGNOSIS — J309 Allergic rhinitis, unspecified: Secondary | ICD-10-CM

## 2017-03-02 DIAGNOSIS — J069 Acute upper respiratory infection, unspecified: Secondary | ICD-10-CM

## 2017-03-12 ENCOUNTER — Other Ambulatory Visit: Payer: Self-pay | Admitting: Family

## 2017-03-14 ENCOUNTER — Other Ambulatory Visit: Payer: Self-pay | Admitting: Family

## 2017-03-14 DIAGNOSIS — R69 Illness, unspecified: Secondary | ICD-10-CM | POA: Diagnosis not present

## 2017-03-18 ENCOUNTER — Other Ambulatory Visit: Payer: Self-pay | Admitting: Family

## 2017-03-23 ENCOUNTER — Other Ambulatory Visit: Payer: Self-pay | Admitting: Family

## 2017-03-23 DIAGNOSIS — J4520 Mild intermittent asthma, uncomplicated: Secondary | ICD-10-CM

## 2017-03-23 DIAGNOSIS — R69 Illness, unspecified: Secondary | ICD-10-CM | POA: Diagnosis not present

## 2017-03-27 ENCOUNTER — Telehealth: Payer: Self-pay | Admitting: Family

## 2017-03-27 MED ORDER — BECLOMETHASONE DIPROP HFA 40 MCG/ACT IN AERB: 2.0000 | INHALATION_SPRAY | Freq: Two times a day (BID) | RESPIRATORY_TRACT | 1 refills | Status: DC

## 2017-03-27 NOTE — Telephone Encounter (Signed)
Qvar Prescription sent to pharmacy   

## 2017-03-27 NOTE — Telephone Encounter (Signed)
Please advise if this is ok.  

## 2017-04-01 ENCOUNTER — Other Ambulatory Visit: Payer: Self-pay | Admitting: Family

## 2017-04-01 DIAGNOSIS — K2100 Gastro-esophageal reflux disease with esophagitis, without bleeding: Secondary | ICD-10-CM

## 2017-04-17 DIAGNOSIS — R69 Illness, unspecified: Secondary | ICD-10-CM | POA: Diagnosis not present

## 2017-04-23 ENCOUNTER — Other Ambulatory Visit: Payer: Self-pay | Admitting: Family

## 2017-05-07 ENCOUNTER — Other Ambulatory Visit: Payer: Self-pay | Admitting: Family

## 2017-05-07 DIAGNOSIS — M8580 Other specified disorders of bone density and structure, unspecified site: Secondary | ICD-10-CM

## 2017-05-07 DIAGNOSIS — E1149 Type 2 diabetes mellitus with other diabetic neurological complication: Secondary | ICD-10-CM

## 2017-05-08 DIAGNOSIS — R69 Illness, unspecified: Secondary | ICD-10-CM | POA: Diagnosis not present

## 2017-05-10 ENCOUNTER — Other Ambulatory Visit: Payer: Self-pay | Admitting: Family

## 2017-05-10 DIAGNOSIS — K5900 Constipation, unspecified: Secondary | ICD-10-CM

## 2017-05-22 ENCOUNTER — Other Ambulatory Visit: Payer: Self-pay | Admitting: Family

## 2017-05-22 DIAGNOSIS — R69 Illness, unspecified: Secondary | ICD-10-CM | POA: Diagnosis not present

## 2017-05-24 ENCOUNTER — Other Ambulatory Visit: Payer: Self-pay | Admitting: Nurse Practitioner

## 2017-05-25 ENCOUNTER — Ambulatory Visit (INDEPENDENT_AMBULATORY_CARE_PROVIDER_SITE_OTHER): Payer: Medicare HMO | Admitting: Family

## 2017-05-25 ENCOUNTER — Encounter: Payer: Self-pay | Admitting: Family

## 2017-05-25 VITALS — BP 123/76 | HR 82 | Temp 97.3°F | Ht 65.0 in | Wt 196.0 lb

## 2017-05-25 DIAGNOSIS — R079 Chest pain, unspecified: Secondary | ICD-10-CM

## 2017-05-25 DIAGNOSIS — I152 Hypertension secondary to endocrine disorders: Secondary | ICD-10-CM

## 2017-05-25 DIAGNOSIS — E669 Obesity, unspecified: Secondary | ICD-10-CM | POA: Insufficient documentation

## 2017-05-25 DIAGNOSIS — I10 Essential (primary) hypertension: Secondary | ICD-10-CM | POA: Diagnosis not present

## 2017-05-25 DIAGNOSIS — E1142 Type 2 diabetes mellitus with diabetic polyneuropathy: Secondary | ICD-10-CM | POA: Diagnosis not present

## 2017-05-25 DIAGNOSIS — I2510 Atherosclerotic heart disease of native coronary artery without angina pectoris: Secondary | ICD-10-CM | POA: Diagnosis not present

## 2017-05-25 DIAGNOSIS — R5383 Other fatigue: Secondary | ICD-10-CM

## 2017-05-25 DIAGNOSIS — R6889 Other general symptoms and signs: Secondary | ICD-10-CM | POA: Diagnosis not present

## 2017-05-25 DIAGNOSIS — E1159 Type 2 diabetes mellitus with other circulatory complications: Secondary | ICD-10-CM | POA: Diagnosis not present

## 2017-05-25 DIAGNOSIS — E663 Overweight: Secondary | ICD-10-CM | POA: Insufficient documentation

## 2017-05-25 DIAGNOSIS — E039 Hypothyroidism, unspecified: Secondary | ICD-10-CM | POA: Diagnosis not present

## 2017-05-25 LAB — BAYER DCA HB A1C WAIVED: HB A1C (BAYER DCA - WAIVED): 5.8 % (ref <7.0)

## 2017-05-25 NOTE — Progress Notes (Signed)
Subjective:    Patient ID: Denise Gardner, female    DOB: 17-May-1945, 72 y.o.   MRN: [id number]  Pt presents to the office today for chronic follow up, but is currently complaining of chest pain that started 4 weeks ago that improves when she rests.  Chest Pain   This is a recurrent problem. The current episode started 1 to 4 weeks ago. The onset quality is sudden. The problem occurs intermittently. The problem has been waxing and waning. The pain is present in the substernal region. The pain is at a severity of 7/10. The pain is moderate. The quality of the pain is described as dull. The pain does not radiate. Associated symptoms include back pain, lower extremity edema, malaise/fatigue and shortness of breath. The pain is aggravated by emotional upset and breathing. She has tried rest for the symptoms. The treatment provided mild relief.  Her past medical history is significant for heart disease, hypertension and thyroid problem.  Diabetes  She presents for her follow-up diabetic visit. She has type 2 diabetes mellitus. Her disease course has been worsening. There are no hypoglycemic associated symptoms. Associated symptoms include chest pain, fatigue, foot paresthesias and visual change. Symptoms are stable. Diabetic complications include heart disease and peripheral neuropathy. Risk factors for coronary artery disease include dyslipidemia, diabetes mellitus, female sex, sedentary lifestyle and post-menopausal. She is following a generally unhealthy diet. Her overall blood glucose range is 140-180 mg/dl.  Hypertension  This is a chronic problem. The current episode started more than 1 year ago. The problem has been resolved since onset. The problem is controlled. Associated symptoms include chest pain, malaise/fatigue, peripheral edema and shortness of breath. Risk factors for coronary artery disease include dyslipidemia, diabetes mellitus, obesity and sedentary lifestyle. Hypertensive end-organ damage  includes CAD/MI. Identifiable causes of hypertension include a thyroid problem.  Thyroid Problem  Presents for follow-up visit. Symptoms include fatigue, hoarse voice and visual change. The symptoms have been stable.      Review of Systems  Constitutional: Positive for fatigue and malaise/fatigue.  HENT: Positive for hoarse voice.   Respiratory: Positive for shortness of breath.   Cardiovascular: Positive for chest pain.  Musculoskeletal: Positive for back pain.  All other systems reviewed and are negative.      Objective:   Physical Exam  Constitutional: She is oriented to person, place, and time. She appears well-developed and well-nourished. No distress.  HENT:  Head: Normocephalic and atraumatic.  Right Ear: External ear normal.  Left Ear: External ear normal.  Nose: Nose normal.  Mouth/Throat: Oropharynx is clear and moist.  Eyes: Pupils are equal, round, and reactive to light.  Neck: Normal range of motion. Neck supple. No thyromegaly present.  Cardiovascular: Normal rate, regular rhythm, normal heart sounds and intact distal pulses.  No murmur heard. Pulmonary/Chest: Effort normal and breath sounds normal. No respiratory distress. She has no wheezes.  Abdominal: Soft. Bowel sounds are normal. She exhibits no distension. There is no tenderness.  Musculoskeletal: Normal range of motion. She exhibits edema (trace BLE). She exhibits no tenderness.  Neurological: She is alert and oriented to person, place, and time.  Skin: Skin is warm and dry.  Psychiatric: She has a normal mood and affect. Her behavior is normal. Judgment and thought content normal.  Vitals reviewed.    Diabetic Foot Exam - Simple   Simple Foot Form Diabetic Foot exam was performed with the following findings:  Yes 05/25/2017 11:26 AM  Visual Inspection No deformities, no ulcerations, no  other skin breakdown bilaterally:  Yes Sensation Testing Intact to touch and monofilament testing bilaterally:   Yes Pulse Check Posterior Tibialis and Dorsalis pulse intact bilaterally:  Yes Comments     BP 123/76   Pulse 82   Temp (!) 97.3 F (36.3 C) (Oral)   Ht '5\' 5"'  (1.651 m)   Wt 196 lb (88.9 kg)   BMI 32.62 kg/m      Assessment & Plan:  1. Chronic coronary artery disease - CMP14+EGFR - Ambulatory referral to Cardiology  2. Hypertension associated with diabetes ([place]) - CMP14+EGFR  3. Type 2 diabetes mellitus with diabetic polyneuropathy, without long-term current use of insulin (HCC) - CMP14+EGFR - Bayer DCA Hb A1c Waived - Microalbumin / creatinine urine ratio - Ambulatory referral to Cardiology  4. Obesity (BMI 30-39.9) - CMP14+EGFR  5. Chest pain, unspecified type Will do stat Cardiologists referral  Discussed if chest pain worsens to go to ED!!!! - CMP14+EGFR - EKG 12-Lead - Ambulatory referral to Cardiology  6. Hypothyroidism, unspecified type - TSH  7. Fatigue, unspecified type - Anemia Profile B - TSH   Continue all meds Labs pending Health Maintenance reviewed Diet and exercise encouraged RTO 3 months   Evelina Dun, FNP

## 2017-05-25 NOTE — Patient Instructions (Signed)
Angina Pectoris Angina pectoris is a very bad feeling in the chest, neck, or arm. Your doctor may call it angina. There are four types of angina. Angina is caused by a lack of blood in the middle and thickest layer of the heart wall (myocardium). Angina may feel like a crushing or squeezing pain in the chest. It may feel like tightness or heavy pressure in the chest. Some people say it feels like gas, heartburn, or indigestion. Some people have symptoms other than pain. These include:  Shortness of breath.  Cold sweats.  Feeling sick to your stomach (nausea).  Feeling light-headed.  Many women have chest discomfort and some of the other symptoms. However, women often have different symptoms, such as:  Feeling tired (fatigue).  Feeling nervous for no reason.  Feeling weak for no reason.  Dizziness or fainting.  Women may have angina without any symptoms. Follow these instructions at home:  Take medicines only as told by your doctor.  Take care of other health issues as told by your doctor. These include: ? High blood pressure (hypertension). ? Diabetes.  Follow a heart-healthy diet. Your doctor can help you to choose healthy food options and make changes.  Talk to your doctor to learn more about healthy cooking methods and use them. These include: ? Roasting. ? Grilling. ? Broiling. ? Baking. ? Poaching. ? Steaming. ? Stir-frying.  Follow an exercise program approved by your doctor.  Keep a healthy weight. Lose weight as told by your doctor.  Rest when you are tired.  Learn to manage stress.  Do not use any tobacco, such as cigarettes, chewing tobacco, or electronic cigarettes. If you need help quitting, ask your doctor.  If you drink alcohol, and your doctor says it is okay, limit yourself to no more than 1 drink per day. One drink equals 12 ounces of beer, 5 ounces of wine, or 1 ounces of hard liquor.  Stop illegal drug use.  Keep all follow-up visits as told  by your doctor. This is important. Do not take these medicines unless your doctor says that you can:  Nonsteroidal anti-inflammatory drugs (NSAIDs). These include: ? Ibuprofen. ? Naproxen. ? Celecoxib.  Vitamin supplements that have vitamin A, vitamin E, or both.  Hormone therapy that contains estrogen with or without progestin.  Get help right away if:  You have pain in your chest, neck, arm, jaw, stomach, or back that: ? Lasts more than a few minutes. ? Comes back. ? Does not get better after you take medicine under your tongue (sublingual nitroglycerin).  You have any of these symptoms for no reason: ? Gas, heartburn, or indigestion. ? Sweating a lot. ? Shortness of breath or trouble breathing. ? Feeling sick to your stomach or throwing up. ? Feeling more tired than usual. ? Feeling nervous or worrying more than usual. ? Feeling weak. ? Diarrhea.  You are suddenly dizzy or light-headed.  You faint or pass out. These symptoms may be an emergency. Do not wait to see if the symptoms will go away. Get medical help right away. Call your local emergency services (911 in the [place] Do not drive yourself to the hospital. This information is not intended to replace advice given to you by your health care provider. Make sure you discuss any questions you have with your health care provider. Document Released: 08/03/2007 Document Revised: 07/23/2015 Document Reviewed: 06/18/2013 Elsevier Interactive Patient Education  2017 Elsevier Inc.  

## 2017-05-26 ENCOUNTER — Telehealth: Payer: Self-pay | Admitting: Family

## 2017-05-26 ENCOUNTER — Other Ambulatory Visit: Payer: Self-pay | Admitting: Family

## 2017-05-26 DIAGNOSIS — D509 Iron deficiency anemia, unspecified: Secondary | ICD-10-CM | POA: Insufficient documentation

## 2017-05-26 DIAGNOSIS — E538 Deficiency of other specified B group vitamins: Secondary | ICD-10-CM | POA: Insufficient documentation

## 2017-05-26 LAB — CMP14+EGFR
ALT: 9 IU/L (ref 0–32)
AST: 13 IU/L (ref 0–40)
Albumin/Globulin Ratio: 1.4 (ref 1.2–2.2)
Albumin: 3.9 g/dL (ref 3.5–4.8)
Alkaline Phosphatase: 29 IU/L — ABNORMAL LOW (ref 39–117)
BUN/Creatinine Ratio: 14 (ref 12–28)
BUN: 15 mg/dL (ref 8–27)
Bilirubin Total: 0.2 mg/dL (ref 0.0–1.2)
CO2: 26 mmol/L (ref 20–29)
Calcium: 10.1 mg/dL (ref 8.7–10.3)
Chloride: 103 mmol/L (ref 96–106)
Creatinine, Ser: 1.11 mg/dL — ABNORMAL HIGH (ref 0.57–1.00)
GFR calc Af Amer: 57 mL/min/{1.73_m2} — ABNORMAL LOW (ref >59)
GFR calc non Af Amer: 49 mL/min/{1.73_m2} — ABNORMAL LOW (ref >59)
Globulin, Total: 2.8 g/dL (ref 1.5–4.5)
Glucose: 101 mg/dL — ABNORMAL HIGH (ref 65–99)
Potassium: 4.5 mmol/L (ref 3.5–5.2)
Sodium: 143 mmol/L (ref 134–144)
Total Protein: 6.7 g/dL (ref 6.0–8.5)

## 2017-05-26 LAB — ANEMIA PROFILE B
Basophils Absolute: 0.1 10*3/uL (ref 0.0–0.2)
Basos: 1 %
EOS (ABSOLUTE): 0.2 10*3/uL (ref 0.0–0.4)
Eos: 2 %
Ferritin: 10 ng/mL — ABNORMAL LOW (ref 15–150)
Folate: 15 ng/mL (ref >3.0)
Hematocrit: 33.7 % — ABNORMAL LOW (ref 34.0–46.6)
Hemoglobin: 10.4 g/dL — ABNORMAL LOW (ref 11.1–15.9)
Immature Grans (Abs): 0 10*3/uL (ref 0.0–0.1)
Immature Granulocytes: 0 %
Iron Saturation: 8 % — CL (ref 15–55)
Iron: 30 ug/dL (ref 27–139)
Lymphocytes Absolute: 3.1 10*3/uL (ref 0.7–3.1)
Lymphs: 34 %
MCH: 24.5 pg — ABNORMAL LOW (ref 26.6–33.0)
MCHC: 30.9 g/dL — ABNORMAL LOW (ref 31.5–35.7)
MCV: 80 fL (ref 79–97)
Monocytes Absolute: 0.8 10*3/uL (ref 0.1–0.9)
Monocytes: 8 %
Neutrophils Absolute: 5.1 10*3/uL (ref 1.4–7.0)
Neutrophils: 55 %
Platelets: 292 10*3/uL (ref 150–379)
RBC: 4.24 x10E6/uL (ref 3.77–5.28)
RDW: 17.2 % — ABNORMAL HIGH (ref 12.3–15.4)
Retic Ct Pct: 1.1 % (ref 0.6–2.6)
Total Iron Binding Capacity: 367 ug/dL (ref 250–450)
UIBC: 337 ug/dL (ref 118–369)
Vitamin B-12: 162 pg/mL — ABNORMAL LOW (ref 232–1245)
WBC: 9.2 10*3/uL (ref 3.4–10.8)

## 2017-05-26 LAB — MICROALBUMIN / CREATININE URINE RATIO
Creatinine, Urine: 109 mg/dL
Microalb/Creat Ratio: 3.3 mg/g creat (ref 0.0–30.0)
Microalbumin, Urine: 3.6 ug/mL

## 2017-05-26 LAB — TSH: TSH: 1.46 u[IU]/mL (ref 0.450–4.500)

## 2017-05-26 NOTE — Telephone Encounter (Signed)
Patient aware of results and will pick up iron today

## 2017-05-29 ENCOUNTER — Telehealth: Payer: Self-pay | Admitting: Family

## 2017-06-01 ENCOUNTER — Other Ambulatory Visit: Payer: Self-pay | Admitting: Family

## 2017-06-01 DIAGNOSIS — J069 Acute upper respiratory infection, unspecified: Secondary | ICD-10-CM

## 2017-06-01 DIAGNOSIS — J309 Allergic rhinitis, unspecified: Secondary | ICD-10-CM

## 2017-06-06 ENCOUNTER — Other Ambulatory Visit: Payer: Self-pay | Admitting: Family

## 2017-06-06 DIAGNOSIS — F419 Anxiety disorder, unspecified: Secondary | ICD-10-CM

## 2017-06-06 DIAGNOSIS — F32A Depression, unspecified: Secondary | ICD-10-CM

## 2017-06-06 DIAGNOSIS — F329 Major depressive disorder, single episode, unspecified: Secondary | ICD-10-CM

## 2017-06-07 DIAGNOSIS — R69 Illness, unspecified: Secondary | ICD-10-CM | POA: Diagnosis not present

## 2017-06-08 ENCOUNTER — Telehealth: Payer: Self-pay | Admitting: Family

## 2017-06-21 ENCOUNTER — Other Ambulatory Visit: Payer: Self-pay | Admitting: Family

## 2017-06-21 DIAGNOSIS — K2100 Gastro-esophageal reflux disease with esophagitis, without bleeding: Secondary | ICD-10-CM

## 2017-06-25 DIAGNOSIS — R69 Illness, unspecified: Secondary | ICD-10-CM | POA: Diagnosis not present

## 2017-06-28 ENCOUNTER — Other Ambulatory Visit: Payer: Self-pay | Admitting: Family

## 2017-06-28 DIAGNOSIS — R69 Illness, unspecified: Secondary | ICD-10-CM | POA: Diagnosis not present

## 2017-06-28 DIAGNOSIS — E1149 Type 2 diabetes mellitus with other diabetic neurological complication: Secondary | ICD-10-CM

## 2017-06-29 ENCOUNTER — Encounter (HOSPITAL_COMMUNITY): Payer: Self-pay | Admitting: Hematology

## 2017-06-29 ENCOUNTER — Other Ambulatory Visit: Payer: Self-pay

## 2017-06-29 ENCOUNTER — Encounter (HOSPITAL_COMMUNITY): Payer: Self-pay | Admitting: Lab

## 2017-06-29 ENCOUNTER — Inpatient Hospital Stay (HOSPITAL_COMMUNITY): Payer: Medicare HMO | Attending: Hematology | Admitting: Hematology

## 2017-06-29 VITALS — BP 144/58 | HR 69 | Temp 97.7°F | Resp 18 | Ht 65.0 in | Wt 209.9 lb

## 2017-06-29 DIAGNOSIS — D509 Iron deficiency anemia, unspecified: Secondary | ICD-10-CM | POA: Diagnosis not present

## 2017-06-29 DIAGNOSIS — E538 Deficiency of other specified B group vitamins: Secondary | ICD-10-CM | POA: Diagnosis not present

## 2017-06-29 DIAGNOSIS — N189 Chronic kidney disease, unspecified: Secondary | ICD-10-CM | POA: Diagnosis not present

## 2017-06-29 DIAGNOSIS — D631 Anemia in chronic kidney disease: Secondary | ICD-10-CM | POA: Insufficient documentation

## 2017-06-29 DIAGNOSIS — D649 Anemia, unspecified: Secondary | ICD-10-CM

## 2017-06-29 MED ORDER — CYANOCOBALAMIN 1000 MCG/ML IJ SOLN
1000.0000 ug | Freq: Once | INTRAMUSCULAR | Status: AC
  Administered 2017-06-29: 1000 ug via INTRAMUSCULAR

## 2017-06-29 MED ORDER — CYANOCOBALAMIN 1000 MCG/ML IJ SOLN
INTRAMUSCULAR | Status: AC
  Filled 2017-06-29: qty 1

## 2017-06-29 NOTE — Patient Instructions (Signed)
[facility] Discharge Instructions  Today you saw Dr. K.   Thank you for choosing [facility] at [facility] to provide your oncology and hematology care.  To afford each patient quality time with our provider, please arrive at least 15 minutes before your scheduled appointment time.   If you have a lab appointment with the Cancer Center please come in thru the  Main Entrance and check in at the main information desk  You need to re-schedule your appointment should you arrive 10 or more minutes late.  We strive to give you quality time with our providers, and arriving late affects you and other patients whose appointments are after yours.  Also, if you no show three or more times for appointments you may be dismissed from the clinic at the providers discretion.     Again, thank you for choosing [facility].  Our hope is that these requests will decrease the amount of time that you wait before being seen by our physicians.       _____________________________________________________________  Should you have questions after your visit to [facility], please contact our office at [phone] between the hours of 8:30 a.m. and 4:30 p.m.  Voicemails left after 4:30 p.m. will not be returned until the following business day.  For prescription refill requests, have your pharmacy contact our office.       Resources For Cancer Patients and their Caregivers ? American Cancer Society: Can assist with transportation, wigs, general needs, runs Look Good Feel Better.        1-888-227-6333 ? Cancer Care: Provides financial assistance, online support groups, medication/co-pay assistance.  1-800-813-HOPE (4673) ? [facility] Assists [place] cancer patients and their families through emotional , educational and financial support.  336-427-4357 ? [place] DSS Where to apply for food  stamps, Medicaid and utility assistance. 336-342-1394 ? [facility]: Transportation to medical appointments. 336-347-2287 ? Social Security Administration: May apply for disability if have a Stage IV cancer. 336-342-7796 1-800-772-1213 ? [place] [facility]ging, Disability and Transit Services: Assists with nutrition, care and transit needs. 336-349-2343  Cancer Center Support Programs:   > Cancer Support Group  2nd Tuesday of the month 1pm-2pm, Journey Room   > Creative Journey  3rd Tuesday of the month 1130am-1pm, Journey Room    

## 2017-06-29 NOTE — Progress Notes (Signed)
[facility] NOTE  Patient Care Team: Sharion Balloon, FNP as PCP - General (Nurse Practitioner) Minus Breeding, MD as Attending Physician (Cardiology) Melina Schools, OD (Optometry)  CHIEF COMPLAINTS/PURPOSE OF CONSULTATION:  Anemia and vitamin B12 deficiency   HISTORY OF PRESENTING ILLNESS:  Denise Gardner 72 y.o. female is here for initial consultation for anemia and vitamin B12 deficiency; referred by PCP, Evelina Dun, NP.   Here today with family.   Labs from 05/25/17 reviewed and significant for Hgb 10.4 g/dL, ferritin low at 10, iron sats low at 8%, and vitamin B12 low at 162. WBCs and platelets were normal at that time. Creatinine was mildly elevated at 1.11; GFR 49.   Endorses dark stools, but she has been taking oral iron for the past 1 month. Denies any frank bleeding from rectum. She had colonoscopy in the past and had 1 polyp removed per her report.  Denies weight loss. Appetite is okay. Reports frequent N&V.    She has shortness of breath with exertion; ambulates with cane d/t balance. Reports recent fall in her yard about 2 weeks ago.  Endorses some peripheral neuropathy to her hands and feet intermittently. Reports that she has been taking vitamin B12 orally for the past 1 month or so.  She has never had B12 injections that she is aware of.  Reports dizziness, which is worse with walking.  She has occasional chest pain "on both sides" with exertion.  Reports memory problems.   She is not aware of any IV iron or blood transfusions in the past.   No recent hospitalizations. She does share about lengthy hospitalization at [facility] in [place] a few years ago.      MEDICAL HISTORY:  Past Medical History:  Diagnosis Date  . Anxiety    depression  . Asthma   . Cataract   . Chronic back pain   . Coronary artery disease    Taxus stent to RCA 2008 2.5 x 16, non obstructive 15% proximal right coronary artery, patent curcumflex LAD, preserved LV  .  Diabetes mellitus without complication ([place])   . GERD (gastroesophageal reflux disease)   . Hyperlipidemia   . Hypertension   . Insomnia   . Migraine   . Neuropathy   . Shingles   . Thyroid disease   . Ulcer, stomach peptic     SURGICAL HISTORY: Past Surgical History:  Procedure Laterality Date  . TOTAL ABDOMINAL HYSTERECTOMY      SOCIAL HISTORY: Social History   Socioeconomic History  . Marital status: Married    Spouse name: Not on file  . Number of children: Not on file  . Years of education: Not on file  . Highest education level: Not on file  Occupational History  . Occupation: retired    Comment: Higher education careers adviser  . Financial resource strain: Not on file  . Food insecurity:    Worry: Not on file    Inability: Not on file  . Transportation needs:    Medical: Not on file    Non-medical: Not on file  Tobacco Use  . Smoking status: Never Smoker  . Smokeless tobacco: Never Used  Substance and Sexual Activity  . Alcohol use: No  . Drug use: No  . Sexual activity: Never  Lifestyle  . Physical activity:    Days per week: Not on file    Minutes per session: Not on file  . Stress: Not on file  Relationships  .  Social connections:    Talks on phone: Not on file    Gets together: Not on file    Attends religious service: Not on file    Active member of club or organization: Not on file    Attends meetings of clubs or organizations: Not on file    Relationship status: Not on file  . Intimate partner violence:    Fear of current or ex partner: Not on file    Emotionally abused: Not on file    Physically abused: Not on file    Forced sexual activity: Not on file  Other Topics Concern  . Not on file  Social History Narrative   Lives with sister taking care of her mother.  Lives here    FAMILY HISTORY: Family History  Problem Relation Age of Onset  . Stroke Mother 2  . Heart disease Mother   . Coronary artery disease Brother 55  . Diabetes  Brother   . Heart disease Brother   . Coronary artery disease Brother 73  . Diabetes Brother   . Heart disease Brother   . Cancer Sister        breast  . Heart disease Sister   . Diabetes Sister   . Heart disease Sister   . Diabetes Sister   . Heart disease Sister   . Diabetes Sister   . Heart disease Sister   . Diabetes Brother   . Heart disease Brother   . Heart attack Brother   . Heart disease Brother   . Heart disease Brother   . Heart disease Brother     ALLERGIES:  is allergic to milk-related compounds; eggs or egg-derived products; other; crestor [rosuvastatin]; and zetia [ezetimibe].  MEDICATIONS:  Current Outpatient Medications  Medication Sig Dispense Refill  . ACCU-CHEK FASTCLIX LANCETS MISC USE TO CHECK BLOOD GLUCOSE TWICE DAILY 102 each 2  . amLODipine (NORVASC) 10 MG tablet TAKE 1 TABLET(10 MG) BY MOUTH DAILY 90 tablet 0  . aspirin 81 MG chewable tablet Chew 81 mg by mouth daily.    . blood glucose meter kit and supplies KIT Dispense based on patient & insurance preference. Use to check BG up to BID (Dx: type 2 diabete, no insulin E11.49) 1 each 0  . cholecalciferol (VITAMIN D) 400 UNITS TABS Take by mouth. VITAMIN D3 daily     . diazepam (VALIUM) 5 MG tablet TAKE 1 TABLET BY MOUTH EVERY 12 HOURS AS NEEDED 60 tablet 0  . diclofenac sodium (VOLTAREN) 1 % GEL Apply 2 g topically 4 (four) times daily. 100 g 2  . escitalopram (LEXAPRO) 10 MG tablet TAKE 1 TABLET BY MOUTH EVERY DAY 90 tablet 0  . fluticasone (FLONASE) 50 MCG/ACT nasal spray SHAKE LIQUID AND USE 2 SPRAYS IN EACH NOSTRIL DAILY 16 g 5  . gabapentin (NEURONTIN) 300 MG capsule TAKE 1 CAPSULE BY MOUTH THREE TIMES DAILY 270 capsule 0  . ipratropium-albuterol (DUONEB) 0.5-2.5 (3) MG/3ML SOLN USE 1 VIAL VIA NEBULIZER EVERY 6 HOURS 1080 mL 2  . ipratropium-albuterol (DUONEB) 0.5-2.5 (3) MG/3ML SOLN USE 1 VIAL VIA NEBULIZER EVERY 6 HOURS 540 mL 2  . isosorbide mononitrate (IMDUR) 60 MG 24 hr tablet Take 1 tablet  (60 mg total) by mouth every morning. 90 tablet 3  . JANUVIA 100 MG tablet TAKE 1 TABLET BY MOUTH EVERY DAY 90 tablet 0  . levothyroxine (SYNTHROID, LEVOTHROID) 88 MCG tablet TAKE ONE TABLET BY MOUTH DAILY 90 tablet 3  . metFORMIN (GLUCOPHAGE) 1000 MG  tablet TAKE 1 TABLET BY MOUTH TWICE DAILY WITH A MEAL 180 tablet 0  . metoprolol tartrate (LOPRESSOR) 25 MG tablet TAKE 1 TABLET BY MOUTH TWICE DAILY 180 tablet 1  . misoprostol (CYTOTEC) 200 MCG tablet TAKE 1 TABLET BY MOUTH FOUR TIMES DAILY 360 tablet 0  . montelukast (SINGULAIR) 10 MG tablet TAKE 1 TABLET BY MOUTH EVERY NIGHT AT BEDTIME 90 tablet 0  . naproxen (NAPROSYN) 500 MG tablet TAKE 1 TABLET(500 MG) BY MOUTH TWICE DAILY WITH A MEAL 180 tablet 0  . nitroGLYCERIN (NITROSTAT) 0.4 MG SL tablet DISSOLVE 1 TABLET UNDER THE TONGUE EVERY 5 MINUTES AS NEEDED FOR CHEST PAIN AS DIRECTED 50 tablet 0  . omeprazole (PRILOSEC) 20 MG capsule TAKE 1 CAPSULE(20 MG) BY MOUTH TWICE DAILY BEFORE A MEAL 180 capsule 1  . omeprazole (PRILOSEC) 20 MG capsule TAKE 1 CAPSULE(20 MG) BY MOUTH TWICE DAILY BEFORE A MEAL 180 capsule 0  . ONE TOUCH ULTRA TEST test strip TEST BLOOD GLUCOSE TWICE DAILY 200 each 3  . QVAR REDIHALER 40 MCG/ACT inhaler INHALE 2 PUFFS BY MOUTH INTO THE LUNGS TWICE DAILY 10.6 g 0  . raloxifene (EVISTA) 60 MG tablet TAKE 1 TABLET(60 MG) BY MOUTH DAILY 90 tablet 0  . sitaGLIPtin (JANUVIA) 100 MG tablet Take 1 tablet (100 mg total) by mouth daily. 30 tablet 2  . VASCEPA 1 g CAPS TAKE 4 CAPSULES BY MOUTH EVERY DAY 360 capsule 0   No current facility-administered medications for this visit.     REVIEW OF SYSTEMS:   Constitutional: Denies fevers, chills or abnormal night sweats Eyes: Denies blurriness of vision, double vision or watery eyes Ears, nose, mouth, throat, and face: Denies mucositis or sore throat Respiratory: Denies cough, dyspnea or wheezes Cardiovascular: Denies palpitation, chest discomfort or lower extremity  swelling Gastrointestinal:  Denies nausea, heartburn or change in bowel habits.  Constipation positive.  Occasional vomiting. Skin: Denies abnormal skin rashes Lymphatics: Denies new lymphadenopathy or easy bruising Neurological:Denies  new weaknesses.  Occasional numbness in the feet. Behavioral/Psych: Mood is stable, no new changes  All other systems were reviewed with the patient and are negative.  PHYSICAL EXAMINATION: ECOG PERFORMANCE STATUS: 1 - Symptomatic but completely ambulatory  Vitals:   06/29/17 1000  BP: (!) 144/58  Pulse: 69  Resp: 18  Temp: 97.7 F (36.5 C)  SpO2: 99%   Filed Weights   06/29/17 1000  Weight: 209 lb 14.4 oz (95.2 kg)    GENERAL:alert, no distress and comfortable SKIN: skin color, texture, turgor are normal, no rashes or significant lesions EYES: normal, conjunctiva are pink and non-injected, sclera clear OROPHARYNX:no exudate, no erythema and lips, buccal mucosa, and tongue normal  NECK: supple, thyroid normal size, non-tender, without nodularity LYMPH:  no palpable lymphadenopathy in the cervical, axillary or inguinal.  There is a vague 1 cm lymph node palpable in the right axillary region. LUNGS: clear to auscultation and percussion with normal breathing effort HEART: regular rate & rhythm and no murmurs .2+ edema bilaterally. ABDOMEN:abdomen soft, non-tender and normal bowel sounds Musculoskeletal:no cyanosis of digits and no clubbing  PSYCH: alert & oriented x 3 with fluent speech NEURO: no focal motor/sensory deficits  LABORATORY DATA:  I have reviewed the data as listed Recent Results (from the past 2160 hour(s))  Bayer DCA Hb A1c Waived     Status: None   Collection Time: 05/25/17 12:01 PM  Result Value Ref Range   Bayer DCA Hb A1c Waived 5.8 <7.0 %  Comment:                                       Diabetic Adult            <7.0                                       Healthy Adult        4.3 - 5.7                                                            (DCCT/NGSP) American Diabetes Association's Summary of Glycemic Recommendations for Adults with Diabetes: Hemoglobin A1c <7.0%. More stringent glycemic goals (A1c <6.0%) may further reduce complications at the cost of increased risk of hypoglycemia.   Microalbumin / creatinine urine ratio     Status: None   Collection Time: 05/25/17 12:14 PM  Result Value Ref Range   Creatinine, Urine 109.0 Not Estab. mg/dL   Microalbumin, Urine 3.6 Not Estab. ug/mL   Microalb/Creat Ratio 3.3 0.0 - 30.0 mg/g creat    Comment:                      Normal:                0.0 -  30.0                      Albuminuria:          31.0 - 300.0                      Clinical albuminuria:       >300.0   CMP14+EGFR     Status: Abnormal   Collection Time: 05/25/17 12:29 PM  Result Value Ref Range   Glucose 101 (H) 65 - 99 mg/dL   BUN 15 8 - 27 mg/dL   Creatinine, Ser 1.11 (H) 0.57 - 1.00 mg/dL   GFR calc non Af Amer 49 (L) >59 mL/min/1.73   GFR calc Af Amer 57 (L) >59 mL/min/1.73   BUN/Creatinine Ratio 14 12 - 28   Sodium 143 134 - 144 mmol/L   Potassium 4.5 3.5 - 5.2 mmol/L   Chloride 103 96 - 106 mmol/L   CO2 26 20 - 29 mmol/L   Calcium 10.1 8.7 - 10.3 mg/dL   Total Protein 6.7 6.0 - 8.5 g/dL   Albumin 3.9 3.5 - 4.8 g/dL   Globulin, Total 2.8 1.5 - 4.5 g/dL   Albumin/Globulin Ratio 1.4 1.2 - 2.2   Bilirubin Total 0.2 0.0 - 1.2 mg/dL   Alkaline Phosphatase 29 (L) 39 - 117 IU/L   AST 13 0 - 40 IU/L   ALT 9 0 - 32 IU/L  Anemia Profile B     Status: Abnormal   Collection Time: 05/25/17 12:29 PM  Result Value Ref Range   Total Iron Binding Capacity 367 250 - 450 ug/dL   UIBC 337 118 - 369 ug/dL   Iron 30 27 - 139 ug/dL   Iron Saturation 8 (LL) 15 - 55 %  Ferritin 10 (L) 15 - 150 ng/mL   Vitamin B-12 162 (L) 232 - 1,245 pg/mL   Folate 15.0 >3.0 ng/mL    Comment: A serum folate concentration of less than 3.1 ng/mL is considered to represent clinical deficiency.    WBC 9.2 3.4 -  10.8 x10E3/uL   RBC 4.24 3.77 - 5.28 x10E6/uL   Hemoglobin 10.4 (L) 11.1 - 15.9 g/dL   Hematocrit 33.7 (L) 34.0 - 46.6 %   MCV 80 79 - 97 fL   MCH 24.5 (L) 26.6 - 33.0 pg   MCHC 30.9 (L) 31.5 - 35.7 g/dL   RDW 17.2 (H) 12.3 - 15.4 %   Platelets 292 150 - 379 x10E3/uL   Neutrophils 55 Not Estab. %   Lymphs 34 Not Estab. %   Monocytes 8 Not Estab. %   Eos 2 Not Estab. %   Basos 1 Not Estab. %   Neutrophils Absolute 5.1 1.4 - 7.0 x10E3/uL   Lymphocytes Absolute 3.1 0.7 - 3.1 x10E3/uL   Monocytes Absolute 0.8 0.1 - 0.9 x10E3/uL   EOS (ABSOLUTE) 0.2 0.0 - 0.4 x10E3/uL   Basophils Absolute 0.1 0.0 - 0.2 x10E3/uL   Immature Granulocytes 0 Not Estab. %   Immature Grans (Abs) 0.0 0.0 - 0.1 x10E3/uL   Retic Ct Pct 1.1 0.6 - 2.6 %  TSH     Status: None   Collection Time: 05/25/17 12:29 PM  Result Value Ref Range   TSH 1.460 0.450 - 4.500 uIU/mL    RADIOGRAPHIC STUDIES: I have reviewed her mammogram from January 2018 which was BI-RADS Category 1. ASSESSMENT & PLAN:  Iron deficiency anemia 1.  Normocytic anemia: - Recent blood work showed hemoglobin of 10.4 with very low ferritin of 10 and percent saturation of 8.  Vitamin B12 is also low at 162.  Her anemia is from a combination of iron deficiency, chronic kidney disease and B12 deficiency.  She reportedly had stool cards checked which were negative.  Her colonoscopy was many years ago at [place] when one polyp was removed.  She is complaining of lightheadedness, dyspnea on exertion.  She started taking iron tablet a month ago and did not see much improvement.  She is also taking B12 tablet daily.  I have recommended parenteral iron therapy with Feraheme weekly x2.  This will help replenish her iron stores fast and help her symptoms.  We will also give her B12 shot today.  I plan to repeat her blood work in 4 weeks including CBC, ferritin, iron panel, B12 and methylmalonic acid.  I have felt a small palpable lymph node under the right  axilla.  She denies any recent injuries to the right upper extremity.  We will follow-up on it at next visit.  Last mammogram was in January 2018 which was BI-RADS Category 1.     All questions were answered. The patient knows to call the clinic with any problems, questions or concerns.   This note includes documentation from Mike Craze, NP, who was present during this patient's office visit and evaluation.  I have reviewed this note for its completeness and accuracy.  I have edited this note accordingly based on my findings and medical opinion.       Derek Jack, MD 06/29/17 2:07 PM

## 2017-06-29 NOTE — Assessment & Plan Note (Signed)
1.  Normocytic anemia: - Recent blood work showed hemoglobin of 10.4 with very low ferritin of 10 and percent saturation of 8.  Vitamin B12 is also low at 162.  Her anemia is from a combination of iron deficiency, chronic kidney disease and B12 deficiency.  She reportedly had stool cards checked which were negative.  Her colonoscopy was many years ago at [facility] when one polyp was removed.  She is complaining of lightheadedness, dyspnea on exertion.  She started taking iron tablet a month ago and did not see much improvement.  She is also taking B12 tablet daily.  I have recommended parenteral iron therapy with Feraheme weekly x2.  This will help replenish her iron stores fast and help her symptoms.  We will also give her B12 shot today.  I plan to repeat her blood work in 4 weeks including CBC, ferritin, iron panel, B12 and methylmalonic acid.  I have felt a small palpable lymph node under the right axilla.  She denies any recent injuries to the right upper extremity.  We will follow-up on it at next visit.  Last mammogram was in January 2018 which was BI-RADS Category 1.

## 2017-06-29 NOTE — Progress Notes (Unsigned)
Referral sent to Dr Wendee Beavers in [facility] for second opinion.  Records faxed to [phone].   They will review and call the patient.

## 2017-07-05 ENCOUNTER — Inpatient Hospital Stay (HOSPITAL_COMMUNITY): Payer: Medicare HMO

## 2017-07-05 VITALS — BP 120/63 | HR 65 | Temp 97.3°F | Resp 18

## 2017-07-05 DIAGNOSIS — D631 Anemia in chronic kidney disease: Secondary | ICD-10-CM | POA: Diagnosis not present

## 2017-07-05 DIAGNOSIS — N189 Chronic kidney disease, unspecified: Secondary | ICD-10-CM | POA: Diagnosis not present

## 2017-07-05 DIAGNOSIS — E538 Deficiency of other specified B group vitamins: Secondary | ICD-10-CM | POA: Diagnosis not present

## 2017-07-05 DIAGNOSIS — D509 Iron deficiency anemia, unspecified: Secondary | ICD-10-CM | POA: Diagnosis not present

## 2017-07-05 MED ORDER — SODIUM CHLORIDE 0.9 % IV SOLN
Freq: Once | INTRAVENOUS | Status: AC
  Administered 2017-07-05: 14:00:00 via INTRAVENOUS

## 2017-07-05 MED ORDER — CYANOCOBALAMIN 1000 MCG/ML IJ SOLN
INTRAMUSCULAR | Status: AC
  Filled 2017-07-05: qty 1

## 2017-07-05 MED ORDER — SODIUM CHLORIDE 0.9 % IV SOLN
510.0000 mg | Freq: Once | INTRAVENOUS | Status: AC
  Administered 2017-07-05: 510 mg via INTRAVENOUS
  Filled 2017-07-05: qty 17

## 2017-07-05 MED ORDER — CYANOCOBALAMIN 1000 MCG/ML IJ SOLN
1000.0000 ug | Freq: Once | INTRAMUSCULAR | Status: AC
  Administered 2017-07-05: 1000 ug via INTRAMUSCULAR

## 2017-07-11 ENCOUNTER — Other Ambulatory Visit: Payer: Self-pay | Admitting: Family

## 2017-07-11 DIAGNOSIS — F419 Anxiety disorder, unspecified: Secondary | ICD-10-CM

## 2017-07-11 NOTE — Telephone Encounter (Signed)
Last seen 05/25/17  [facility]

## 2017-07-12 ENCOUNTER — Ambulatory Visit (HOSPITAL_COMMUNITY): Payer: Medicare HMO

## 2017-07-12 ENCOUNTER — Inpatient Hospital Stay (HOSPITAL_COMMUNITY): Payer: Medicare HMO

## 2017-07-12 ENCOUNTER — Encounter (HOSPITAL_COMMUNITY): Payer: Self-pay

## 2017-07-12 ENCOUNTER — Other Ambulatory Visit: Payer: Self-pay | Admitting: Family

## 2017-07-12 VITALS — BP 113/57 | HR 62 | Temp 97.6°F | Resp 18 | Wt 200.2 lb

## 2017-07-12 DIAGNOSIS — D631 Anemia in chronic kidney disease: Secondary | ICD-10-CM | POA: Diagnosis not present

## 2017-07-12 DIAGNOSIS — E538 Deficiency of other specified B group vitamins: Secondary | ICD-10-CM | POA: Diagnosis not present

## 2017-07-12 DIAGNOSIS — D509 Iron deficiency anemia, unspecified: Secondary | ICD-10-CM

## 2017-07-12 DIAGNOSIS — N189 Chronic kidney disease, unspecified: Secondary | ICD-10-CM | POA: Diagnosis not present

## 2017-07-12 MED ORDER — SODIUM CHLORIDE 0.9 % IV SOLN
510.0000 mg | Freq: Once | INTRAVENOUS | Status: AC
  Administered 2017-07-12: 510 mg via INTRAVENOUS
  Filled 2017-07-12: qty 17

## 2017-07-12 MED ORDER — SODIUM CHLORIDE 0.9 % IV SOLN
Freq: Once | INTRAVENOUS | Status: AC
Start: 2017-07-12 — End: 2017-07-12
  Administered 2017-07-12: 11:00:00 via INTRAVENOUS

## 2017-07-12 NOTE — Progress Notes (Signed)
[name] tolerated Feraheme infusion well without complaints or incident. VSS upon discharge. Pt discharged self ambulatory using cane in satisfactory condition accompanied by family member

## 2017-07-12 NOTE — Patient Instructions (Signed)
[facility] Discharge Instructions  Received Feraheme infusion today. Follow-up as scheduled. Call clinic for any questions or concerns   Thank you for choosing [facility] at [facility] to provide your oncology and hematology care.  To afford each patient quality time with our provider, please arrive at least 15 minutes before your scheduled appointment time.   If you have a lab appointment with the Cancer Center please come in thru the  Main Entrance and check in at the main information desk  You need to re-schedule your appointment should you arrive 10 or more minutes late.  We strive to give you quality time with our providers, and arriving late affects you and other patients whose appointments are after yours.  Also, if you no show three or more times for appointments you may be dismissed from the clinic at the providers discretion.     Again, thank you for choosing [facility].  Our hope is that these requests will decrease the amount of time that you wait before being seen by our physicians.       _____________________________________________________________  Should you have questions after your visit to [facility], please contact our office at [phone] between the hours of 8:30 a.m. and 4:30 p.m.  Voicemails left after 4:30 p.m. will not be returned until the following business day.  For prescription refill requests, have your pharmacy contact our office.       Resources For Cancer Patients and their Caregivers ? American Cancer Society: Can assist with transportation, wigs, general needs, runs Look Good Feel Better.        1-888-227-6333 ? Cancer Care: Provides financial assistance, online support groups, medication/co-pay assistance.  1-800-813-HOPE (4673) ? [facility] Assists [place] cancer patients and their families through emotional , educational and  financial support.  336-427-4357 ? [place] DSS Where to apply for food stamps, Medicaid and utility assistance. 336-342-1394 ? [facility]: Transportation to medical appointments. 336-347-2287 ? Social Security Administration: May apply for disability if have a Stage IV cancer. 336-342-7796 1-800-772-1213 ? [place] Aging, Disability and Transit Services: Assists with nutrition, care and transit needs. 336-349-2343  Cancer Center Support Programs:   > Cancer Support Group  2nd Tuesday of the month 1pm-2pm, Journey Room   > Creative Journey  3rd Tuesday of the month 1130am-1pm, Journey Room    

## 2017-07-18 ENCOUNTER — Encounter: Payer: Self-pay | Admitting: Cardiology

## 2017-07-19 ENCOUNTER — Encounter: Payer: Self-pay | Admitting: *Deleted

## 2017-07-23 ENCOUNTER — Other Ambulatory Visit: Payer: Self-pay | Admitting: Family

## 2017-07-25 NOTE — Telephone Encounter (Signed)
Last seen 05/25/17

## 2017-07-27 ENCOUNTER — Other Ambulatory Visit: Payer: Self-pay | Admitting: Family

## 2017-07-27 DIAGNOSIS — R69 Illness, unspecified: Secondary | ICD-10-CM | POA: Diagnosis not present

## 2017-07-27 DIAGNOSIS — J4520 Mild intermittent asthma, uncomplicated: Secondary | ICD-10-CM

## 2017-08-01 ENCOUNTER — Inpatient Hospital Stay (HOSPITAL_COMMUNITY): Payer: Medicare HMO | Admitting: Hematology

## 2017-08-01 ENCOUNTER — Other Ambulatory Visit: Payer: Self-pay

## 2017-08-01 ENCOUNTER — Encounter (HOSPITAL_COMMUNITY): Payer: Self-pay

## 2017-08-01 ENCOUNTER — Telehealth: Payer: Self-pay | Admitting: Family

## 2017-08-01 ENCOUNTER — Other Ambulatory Visit (HOSPITAL_COMMUNITY): Payer: Medicare HMO

## 2017-08-01 ENCOUNTER — Inpatient Hospital Stay (HOSPITAL_COMMUNITY): Payer: Medicare HMO | Attending: Hematology

## 2017-08-01 VITALS — BP 148/75 | HR 71 | Temp 98.5°F | Resp 18

## 2017-08-01 DIAGNOSIS — D649 Anemia, unspecified: Secondary | ICD-10-CM

## 2017-08-01 DIAGNOSIS — E538 Deficiency of other specified B group vitamins: Secondary | ICD-10-CM

## 2017-08-01 DIAGNOSIS — L509 Urticaria, unspecified: Secondary | ICD-10-CM | POA: Diagnosis not present

## 2017-08-01 DIAGNOSIS — D509 Iron deficiency anemia, unspecified: Secondary | ICD-10-CM | POA: Diagnosis not present

## 2017-08-01 LAB — CBC WITH DIFFERENTIAL/PLATELET
Basophils Absolute: 0.1 10*3/uL (ref 0.0–0.1)
Basophils Relative: 1 %
Eosinophils Absolute: 0.2 10*3/uL (ref 0.0–0.7)
Eosinophils Relative: 3 %
HCT: 37.3 % (ref 36.0–46.0)
Hemoglobin: 11.6 g/dL — ABNORMAL LOW (ref 12.0–15.0)
Lymphocytes Relative: 37 %
Lymphs Abs: 2.6 10*3/uL (ref 0.7–4.0)
MCH: 27.8 pg (ref 26.0–34.0)
MCHC: 31.1 g/dL (ref 30.0–36.0)
MCV: 89.4 fL (ref 78.0–100.0)
Monocytes Absolute: 0.5 10*3/uL (ref 0.1–1.0)
Monocytes Relative: 6 %
Neutro Abs: 3.7 10*3/uL (ref 1.7–7.7)
Neutrophils Relative %: 53 %
Platelets: 205 10*3/uL (ref 150–400)
RBC: 4.17 MIL/uL (ref 3.87–5.11)
RDW: 19.5 % — ABNORMAL HIGH (ref 11.5–15.5)
WBC: 7 10*3/uL (ref 4.0–10.5)

## 2017-08-01 LAB — IRON AND TIBC
Iron: 95 ug/dL (ref 28–170)
Saturation Ratios: 32 % — ABNORMAL HIGH (ref 10.4–31.8)
TIBC: 293 ug/dL (ref 250–450)
UIBC: 198 ug/dL

## 2017-08-01 LAB — COMPREHENSIVE METABOLIC PANEL
ALT: 15 U/L (ref 14–54)
AST: 17 U/L (ref 15–41)
Albumin: 3.5 g/dL (ref 3.5–5.0)
Alkaline Phosphatase: 28 U/L — ABNORMAL LOW (ref 38–126)
Anion gap: 7 (ref 5–15)
BUN: 22 mg/dL — ABNORMAL HIGH (ref 6–20)
CO2: 29 mmol/L (ref 22–32)
Calcium: 9.4 mg/dL (ref 8.9–10.3)
Chloride: 103 mmol/L (ref 101–111)
Creatinine, Ser: 1.07 mg/dL — ABNORMAL HIGH (ref 0.44–1.00)
GFR calc Af Amer: 58 mL/min — ABNORMAL LOW (ref >60)
GFR calc non Af Amer: 50 mL/min — ABNORMAL LOW (ref >60)
Glucose, Bld: 150 mg/dL — ABNORMAL HIGH (ref 65–99)
Potassium: 4.1 mmol/L (ref 3.5–5.1)
Sodium: 139 mmol/L (ref 135–145)
Total Bilirubin: 0.5 mg/dL (ref 0.3–1.2)
Total Protein: 6.5 g/dL (ref 6.5–8.1)

## 2017-08-01 LAB — FERRITIN: Ferritin: 319 ng/mL — ABNORMAL HIGH (ref 11–307)

## 2017-08-01 LAB — VITAMIN B12: Vitamin B-12: 830 pg/mL (ref 180–914)

## 2017-08-01 LAB — FOLATE: Folate: 19.1 ng/mL (ref >5.9)

## 2017-08-01 MED ORDER — CYANOCOBALAMIN 1000 MCG/ML IJ SOLN
INTRAMUSCULAR | Status: AC
  Filled 2017-08-01: qty 1

## 2017-08-01 MED ORDER — CYANOCOBALAMIN 1000 MCG/ML IJ SOLN
1000.0000 ug | Freq: Once | INTRAMUSCULAR | Status: AC
  Administered 2017-08-01: 1000 ug via INTRAMUSCULAR

## 2017-08-01 NOTE — Progress Notes (Signed)
Denise Gardner presents today for injection per the provider's orders.  B12 administration without incident; see MAR for injection details.  Patient tolerated procedure well and without incident.  No questions or complaints noted at this time.  Discharged ambulatory in c/o family.

## 2017-08-01 NOTE — Telephone Encounter (Signed)
Line busy

## 2017-08-01 NOTE — Telephone Encounter (Signed)
Spoke with patient, they are leaving now to take her to [facility]

## 2017-08-03 LAB — METHYLMALONIC ACID, SERUM: Methylmalonic Acid, Quantitative: 152 nmol/L (ref 0–378)

## 2017-08-06 ENCOUNTER — Other Ambulatory Visit: Payer: Self-pay | Admitting: Family

## 2017-08-06 DIAGNOSIS — M8580 Other specified disorders of bone density and structure, unspecified site: Secondary | ICD-10-CM

## 2017-08-06 DIAGNOSIS — E1149 Type 2 diabetes mellitus with other diabetic neurological complication: Secondary | ICD-10-CM

## 2017-08-07 ENCOUNTER — Other Ambulatory Visit: Payer: Self-pay | Admitting: Family

## 2017-08-07 DIAGNOSIS — K5900 Constipation, unspecified: Secondary | ICD-10-CM

## 2017-08-09 ENCOUNTER — Ambulatory Visit: Payer: Medicare HMO | Admitting: Cardiology

## 2017-08-15 DIAGNOSIS — R69 Illness, unspecified: Secondary | ICD-10-CM | POA: Diagnosis not present

## 2017-08-24 ENCOUNTER — Other Ambulatory Visit: Payer: Self-pay | Admitting: Family

## 2017-08-25 ENCOUNTER — Ambulatory Visit (INDEPENDENT_AMBULATORY_CARE_PROVIDER_SITE_OTHER): Payer: Medicare HMO | Admitting: Family

## 2017-08-25 ENCOUNTER — Encounter: Payer: Self-pay | Admitting: Family

## 2017-08-25 VITALS — BP 134/84 | HR 72 | Temp 98.8°F | Ht 65.0 in | Wt 202.8 lb

## 2017-08-25 DIAGNOSIS — K219 Gastro-esophageal reflux disease without esophagitis: Secondary | ICD-10-CM | POA: Diagnosis not present

## 2017-08-25 DIAGNOSIS — F419 Anxiety disorder, unspecified: Secondary | ICD-10-CM

## 2017-08-25 DIAGNOSIS — E1169 Type 2 diabetes mellitus with other specified complication: Secondary | ICD-10-CM | POA: Diagnosis not present

## 2017-08-25 DIAGNOSIS — I10 Essential (primary) hypertension: Secondary | ICD-10-CM

## 2017-08-25 DIAGNOSIS — E669 Obesity, unspecified: Secondary | ICD-10-CM

## 2017-08-25 DIAGNOSIS — E1142 Type 2 diabetes mellitus with diabetic polyneuropathy: Secondary | ICD-10-CM

## 2017-08-25 DIAGNOSIS — E1159 Type 2 diabetes mellitus with other circulatory complications: Secondary | ICD-10-CM

## 2017-08-25 DIAGNOSIS — I152 Hypertension secondary to endocrine disorders: Secondary | ICD-10-CM

## 2017-08-25 DIAGNOSIS — E785 Hyperlipidemia, unspecified: Secondary | ICD-10-CM

## 2017-08-25 DIAGNOSIS — E538 Deficiency of other specified B group vitamins: Secondary | ICD-10-CM

## 2017-08-25 DIAGNOSIS — R69 Illness, unspecified: Secondary | ICD-10-CM | POA: Diagnosis not present

## 2017-08-25 DIAGNOSIS — E039 Hypothyroidism, unspecified: Secondary | ICD-10-CM

## 2017-08-25 DIAGNOSIS — D509 Iron deficiency anemia, unspecified: Secondary | ICD-10-CM | POA: Diagnosis not present

## 2017-08-25 DIAGNOSIS — F331 Major depressive disorder, recurrent, moderate: Secondary | ICD-10-CM

## 2017-08-25 NOTE — Progress Notes (Signed)
Subjective:    Patient ID: Denise Gardner, female    DOB: 1945/10/06, 72 y.o.   MRN: [id number]  Chief Complaint  Patient presents with  . Diabetes    three month recheck   Pt presents to the office today for chronic follow up. Has appt with Cardiologists next month. Diabetes  She presents for her follow-up diabetic visit. She has type 2 diabetes mellitus. There are no hypoglycemic associated symptoms. Associated symptoms include fatigue and foot paresthesias. Pertinent negatives for diabetes include no blurred vision and no visual change. Symptoms are stable. Diabetic complications include heart disease. Pertinent negatives for diabetic complications include no CVA. Risk factors for coronary artery disease include diabetes mellitus, hypertension, dyslipidemia, family history and obesity. She is following a generally healthy diet. Her overall blood glucose range is 110-130 mg/dl. Eye exam is current.  Hypertension  This is a chronic problem. The current episode started more than 1 year ago. The problem has been resolved since onset. The problem is controlled. Associated symptoms include malaise/fatigue and peripheral edema. Pertinent negatives include no blurred vision or shortness of breath. Risk factors for coronary artery disease include dyslipidemia, diabetes mellitus, obesity and sedentary lifestyle. The current treatment provides moderate improvement. There is no history of CVA. Identifiable causes of hypertension include a thyroid problem.  Hyperlipidemia  This is a chronic problem. The current episode started more than 1 year ago. The problem is controlled. Recent lipid tests were reviewed and are normal. Exacerbating diseases include obesity. Pertinent negatives include no shortness of breath. Current antihyperlipidemic treatment includes statins.  Anemia  Presents for follow-up visit. Symptoms include malaise/fatigue. There has been no bruising/bleeding easily or light-headedness.    Thyroid Problem  Presents for follow-up visit. Symptoms include constipation, fatigue and hoarse voice. Patient reports no diarrhea or visual change. The symptoms have been stable. Her past medical history is significant for hyperlipidemia.  COPD Pt uses albuterol as needed. States her SOB is worse in the heat and when walking distances. Using QVAR daily.    Review of Systems  Constitutional: Positive for fatigue and malaise/fatigue.  HENT: Positive for hoarse voice.   Eyes: Negative for blurred vision.  Respiratory: Negative for shortness of breath.   Gastrointestinal: Positive for constipation. Negative for diarrhea.  Neurological: Negative for light-headedness.  Hematological: Does not bruise/bleed easily.  All other systems reviewed and are negative.      Objective:   Physical Exam  Constitutional: She is oriented to person, place, and time. She appears well-developed and well-nourished. No distress.  HENT:  Head: Normocephalic and atraumatic.  Right Ear: External ear normal.  Left Ear: External ear normal.  Mouth/Throat: Oropharynx is clear and moist.  Eyes: Pupils are equal, round, and reactive to light.  Neck: Normal range of motion. Neck supple. No thyromegaly present.  Cardiovascular: Normal rate, regular rhythm, normal heart sounds and intact distal pulses.  No murmur heard. Pulmonary/Chest: Effort normal and breath sounds normal. No respiratory distress. She has no wheezes.  Abdominal: Soft. Bowel sounds are normal. She exhibits no distension. There is no tenderness.  Musculoskeletal: Normal range of motion. She exhibits edema (trace BLE). She exhibits no tenderness.  Neurological: She is alert and oriented to person, place, and time. She has normal reflexes. No cranial nerve deficit.  Skin: Skin is warm and dry.  Psychiatric: She has a normal mood and affect. Her behavior is normal. Judgment and thought content normal.  Vitals reviewed.     BP 134/84   Pulse 72  Temp 98.8 F (37.1 C) (Oral)   Ht 5\' 5"  (1.651 m)   Wt 202 lb 12.8 oz (92 kg)   BMI 33.75 kg/m      Assessment & Plan:  Denise Gardner comes in today with chief complaint of Diabetes (three month recheck)   Diagnosis and orders addressed:  1. Hypertension associated with diabetes ([place])  2. Anxiety  3. Hyperlipidemia associated with type 2 diabetes mellitus ([place])  4. Type 2 diabetes mellitus with diabetic polyneuropathy, without long-term current use of insulin ([place])  5. Hypothyroidism, unspecified typ  6. Iron deficiency anemia, unspecified iron deficiency anemia type  7. Vitamin B 12 deficiency  8. Obesity (BMI 30-39.9)  9. Moderate episode of recurrent major depressive disorder ([place]  10. Gastroesophageal reflux disease, esophagitis presence not specified    Pt had lab work drawn on 08/01/17 from hematologists, will hold off on at this time Health Maintenance reviewed Diet and exercise encouraged  Follow up plan: 3 months   Evelina Dun, FNP

## 2017-08-25 NOTE — Patient Instructions (Signed)

## 2017-09-02 ENCOUNTER — Other Ambulatory Visit: Payer: Self-pay | Admitting: Family

## 2017-09-02 DIAGNOSIS — F32A Depression, unspecified: Secondary | ICD-10-CM

## 2017-09-02 DIAGNOSIS — F329 Major depressive disorder, single episode, unspecified: Secondary | ICD-10-CM

## 2017-09-02 DIAGNOSIS — F419 Anxiety disorder, unspecified: Secondary | ICD-10-CM

## 2017-09-04 ENCOUNTER — Inpatient Hospital Stay (HOSPITAL_COMMUNITY): Payer: Medicare HMO

## 2017-09-04 ENCOUNTER — Encounter (HOSPITAL_COMMUNITY): Payer: Self-pay | Admitting: Hematology

## 2017-09-04 ENCOUNTER — Other Ambulatory Visit: Payer: Self-pay

## 2017-09-04 ENCOUNTER — Inpatient Hospital Stay (HOSPITAL_COMMUNITY): Payer: Medicare HMO | Attending: Hematology | Admitting: Hematology

## 2017-09-04 VITALS — BP 123/65 | HR 71 | Temp 98.0°F | Resp 16 | Wt 202.0 lb

## 2017-09-04 DIAGNOSIS — D509 Iron deficiency anemia, unspecified: Secondary | ICD-10-CM | POA: Diagnosis not present

## 2017-09-04 DIAGNOSIS — E538 Deficiency of other specified B group vitamins: Secondary | ICD-10-CM

## 2017-09-04 MED ORDER — CYANOCOBALAMIN 1000 MCG/ML IJ SOLN
1000.0000 ug | Freq: Once | INTRAMUSCULAR | Status: DC
  Filled 2017-09-04: qty 1

## 2017-09-04 NOTE — Assessment & Plan Note (Signed)
1.  Normocytic anemia: - Etiology: Combination anemia from iron deficiency, CKD and B12 deficiency. -Received weekly Feraheme x2 on 07/05/2017 and 07/12/2017.  Was also started on B12 injections monthly.  Patient reported having a lot of skin rash after receiving last B12 shot on 08/01/2017.  It is unlikely that B12 injection has caused this.  However we will discontinue B12 shots at this time.  She was told to take B12 1 mg tablet daily.  She is also continuing iron tablet. - Denies any bleeding per rectum but has dark stools from taking iron tablet.  Last colonoscopy was many years ago at [facility] when one polyp was removed. -We will see her back in 3 months with repeat blood work.  Her hemoglobin improved to 11.6 after 2 infusions of Feraheme.

## 2017-09-04 NOTE — Progress Notes (Signed)
[place], [place] [address]   CLINIC:  Medical Oncology/Hematology  PCP:  Sharion Balloon, [place] [address] [phone]   REASON FOR VISIT:  Follow-up for anemia and vitamin B12 deficiency   CURRENT THERAPY: Ferahem infusions and oral B12   INTERVAL HISTORY:  Ms. Slattery 72 y.o. female returns for routine follow-up for anemia and B12 deficiency. Patient complains that she had a reaction to the B12 injections on the last visit and doesn't want them any longer. She has been taking oral B12. Patient states she still has fatigue. Patient doesn't get much exercise due to the fatigue and being SOB. She has to take frequent breaks. Patient states she has been gaining weight and would like to try and loose a few pounds. Overall, she tells me she has been feeling pretty well. Energy levels 25%; appetite 75%.     REVIEW OF SYSTEMS:  Review of Systems  Constitutional: Positive for fatigue.  HENT:  Negative.   Eyes: Negative.   Respiratory: Negative.   Cardiovascular: Negative.   Gastrointestinal: Positive for nausea.  Endocrine: Negative.   Genitourinary: Negative.    Neurological: Positive for dizziness, extremity weakness and numbness.  Hematological: Bruises/bleeds easily.  Psychiatric/Behavioral: Negative.      PAST MEDICAL/SURGICAL HISTORY:  Past Medical History:  Diagnosis Date  . Anxiety    depression  . Asthma   . Cataract   . Chronic back pain   . Coronary artery disease    Taxus stent to RCA 2008 2.5 x 16, non obstructive 15% proximal right coronary artery, patent curcumflex LAD, preserved LV  . Diabetes mellitus without complication ([place])   . GERD (gastroesophageal reflux disease)   . Hyperlipidemia   . Hypertension   . Insomnia   . Migraine   . Neuropathy   . Shingles   . Thyroid disease   . Ulcer, stomach peptic    Past Surgical History:  Procedure Laterality Date  . TOTAL ABDOMINAL  HYSTERECTOMY       SOCIAL HISTORY:  Social History   Socioeconomic History  . Marital status: Married    Spouse name: Not on file  . Number of children: Not on file  . Years of education: Not on file  . Highest education level: Not on file  Occupational History  . Occupation: retired    Comment: Higher education careers adviser  . Financial resource strain: Not on file  . Food insecurity:    Worry: Not on file    Inability: Not on file  . Transportation needs:    Medical: Not on file    Non-medical: Not on file  Tobacco Use  . Smoking status: Never Smoker  . Smokeless tobacco: Never Used  Substance and Sexual Activity  . Alcohol use: No  . Drug use: No  . Sexual activity: Never  Lifestyle  . Physical activity:    Days per week: Not on file    Minutes per session: Not on file  . Stress: Not on file  Relationships  . Social connections:    Talks on phone: Not on file    Gets together: Not on file    Attends religious service: Not on file    Active member of club or organization: Not on file    Attends meetings of clubs or organizations: Not on file    Relationship status: Not on file  . Intimate partner violence:    Fear  of current or ex partner: Not on file    Emotionally abused: Not on file    Physically abused: Not on file    Forced sexual activity: Not on file  Other Topics Concern  . Not on file  Social History Narrative   Lives with sister taking care of her mother.  Lives here    FAMILY HISTORY:  Family History  Problem Relation Age of Onset  . Stroke Mother 65  . Heart disease Mother   . Coronary artery disease Brother 13  . Diabetes Brother   . Heart disease Brother   . Coronary artery disease Brother 34  . Diabetes Brother   . Heart disease Brother   . Cancer Sister        breast  . Heart disease Sister   . Diabetes Sister   . Heart disease Sister   . Diabetes Sister   . Heart disease Sister   . Diabetes Sister   . Heart disease Sister   .  Diabetes Brother   . Heart disease Brother   . Heart attack Brother   . Heart disease Brother   . Heart disease Brother   . Heart disease Brother     CURRENT MEDICATIONS:  Outpatient Encounter Medications as of 09/04/2017  Medication Sig  . ACCU-CHEK FASTCLIX LANCETS MISC USE TO CHECK BLOOD GLUCOSE TWICE DAILY  . amLODipine (NORVASC) 10 MG tablet TAKE 1 TABLET(10 MG) BY MOUTH DAILY  . aspirin 81 MG chewable tablet Chew 81 mg by mouth daily.  . blood glucose meter kit and supplies KIT Dispense based on patient & insurance preference. Use to check BG up to BID (Dx: type 2 diabete, no insulin E11.49)  . cholecalciferol (VITAMIN D) 400 UNITS TABS Take by mouth. VITAMIN D3 daily   . diazepam (VALIUM) 5 MG tablet TAKE 1 TABLET BY MOUTH EVERY 12 HOURS AS NEEDED  . diclofenac sodium (VOLTAREN) 1 % GEL Apply 2 g topically 4 (four) times daily.  Marland Kitchen escitalopram (LEXAPRO) 10 MG tablet TAKE 1 TABLET BY MOUTH EVERY DAY  . fluticasone (FLONASE) 50 MCG/ACT nasal spray SHAKE LIQUID AND USE 2 SPRAYS IN EACH NOSTRIL DAILY  . gabapentin (NEURONTIN) 300 MG capsule TAKE 1 CAPSULE BY MOUTH THREE TIMES DAILY  . hydrOXYzine (ATARAX/VISTARIL) 25 MG tablet TK 1 TO 2 TS PO Q 6 H PRF ITCHING.  Marland Kitchen ipratropium-albuterol (DUONEB) 0.5-2.5 (3) MG/3ML SOLN USE 1 VIAL VIA NEBULIZER EVERY 6 HOURS  . ipratropium-albuterol (DUONEB) 0.5-2.5 (3) MG/3ML SOLN USE 1 VIAL VIA NEBULIZER EVERY 6 HOURS  . isosorbide mononitrate (IMDUR) 60 MG 24 hr tablet TAKE 1 TABLET BY MOUTH EVERY MORNING  . JANUVIA 100 MG tablet TAKE 1 TABLET BY MOUTH EVERY DAY  . levothyroxine (SYNTHROID, LEVOTHROID) 88 MCG tablet TAKE ONE TABLET BY MOUTH DAILY  . metFORMIN (GLUCOPHAGE) 1000 MG tablet TAKE 1 TABLET BY MOUTH TWICE DAILY WITH A MEAL  . metoprolol tartrate (LOPRESSOR) 25 MG tablet TAKE 1 TABLET BY MOUTH TWICE DAILY  . misoprostol (CYTOTEC) 200 MCG tablet TAKE 1 TABLET BY MOUTH FOUR TIMES DAILY  . montelukast (SINGULAIR) 10 MG tablet TAKE 1 TABLET BY  MOUTH EVERY NIGHT AT BEDTIME  . naproxen (NAPROSYN) 500 MG tablet TAKE 1 TABLET(500 MG) BY MOUTH TWICE DAILY WITH A MEAL  . nitroGLYCERIN (NITROSTAT) 0.4 MG SL tablet DISSOLVE 1 TABLET UNDER THE TONGUE EVERY 5 MINUTES AS NEEDED FOR CHEST PAIN AS DIRECTED  . omeprazole (PRILOSEC) 20 MG capsule TAKE 1 CAPSULE(20 MG) BY MOUTH  TWICE DAILY BEFORE A MEAL  . ONE TOUCH ULTRA TEST test strip TEST BLOOD GLUCOSE TWICE DAILY  . QVAR REDIHALER 40 MCG/ACT inhaler INHALE 2 PUFFS BY MOUTH INTO THE LUNGS TWICE DAILY  . raloxifene (EVISTA) 60 MG tablet TAKE 1 TABLET(60 MG) BY MOUTH DAILY  . sitaGLIPtin (JANUVIA) 100 MG tablet Take 1 tablet (100 mg total) by mouth daily.  Marland Kitchen VASCEPA 1 g CAPS TAKE 4 CAPSULES BY MOUTH EVERY DAY   Facility-Administered Encounter Medications as of 09/04/2017  Medication  . cyanocobalamin ((VITAMIN B-12)) injection 1,000 mcg    ALLERGIES:  Allergies  Allergen Reactions  . Milk-Related Compounds Itching and Nausea And Vomiting  . Eggs Or Egg-Derived Products Itching, Nausea And Vomiting and Other (See Comments)    headaches  . Other     Other reaction(s): Other (See Comments) Per [facility] Order  . Crestor [Rosuvastatin]     Myalgias   . Zetia [Ezetimibe]     mylagia     PHYSICAL EXAM:  ECOG Performance status: 1  Vitals:   09/04/17 1518  BP: 123/65  Pulse: 71  Resp: 16  Temp: 98 F (36.7 C)  SpO2: 95%   Filed Weights   09/04/17 1518  Weight: 202 lb (91.6 kg)    Physical Exam No palpable lymphadenopathy in the axillary region.  LABORATORY DATA:  I have reviewed the labs as listed.  CBC    Component Value Date/Time   WBC 7.0 08/01/2017 1334   RBC 4.17 08/01/2017 1334   HGB 11.6 (L) 08/01/2017 1334   HGB 10.4 (L) 05/25/2017 1229   HCT 37.3 08/01/2017 1334   HCT 33.7 (L) 05/25/2017 1229   PLT 205 08/01/2017 1334   PLT 292 05/25/2017 1229   MCV 89.4 08/01/2017 1334   MCV 80 05/25/2017 1229   MCH 27.8 08/01/2017 1334   MCHC 31.1 08/01/2017 1334     RDW 19.5 (H) 08/01/2017 1334   RDW 17.2 (H) 05/25/2017 1229   LYMPHSABS 2.6 08/01/2017 1334   LYMPHSABS 3.1 05/25/2017 1229   MONOABS 0.5 08/01/2017 1334   EOSABS 0.2 08/01/2017 1334   EOSABS 0.2 05/25/2017 1229   BASOSABS 0.1 08/01/2017 1334   BASOSABS 0.1 05/25/2017 1229   CMP Latest Ref Rng & Units 08/01/2017 05/25/2017 02/24/2017  Glucose 65 - 99 mg/dL 150(H) 101(H) 98  BUN 6 - 20 mg/dL 22(H) 15 20  Creatinine 0.44 - 1.00 mg/dL 1.07(H) 1.11(H) 1.01(H)  Sodium 135 - 145 mmol/L 139 143 141  Potassium 3.5 - 5.1 mmol/L 4.1 4.5 4.2  Chloride 101 - 111 mmol/L 103 103 102  CO2 22 - 32 mmol/L '29 26 23  ' Calcium 8.9 - 10.3 mg/dL 9.4 10.1 9.5  Total Protein 6.5 - 8.1 g/dL 6.5 6.7 6.6  Total Bilirubin 0.3 - 1.2 mg/dL 0.5 0.2 0.3  Alkaline Phos 38 - 126 U/L 28(L) 29(L) 29(L)  AST 15 - 41 U/L '17 13 16  ' ALT 14 - 54 U/L '15 9 11       ' ASSESSMENT & PLAN:   Iron deficiency anemia 1.  Normocytic anemia: - Etiology: Combination anemia from iron deficiency, CKD and B12 deficiency. -Received weekly Feraheme x2 on 07/05/2017 and 07/12/2017.  Was also started on B12 injections monthly.  Patient reported having a lot of skin rash after receiving last B12 shot on 08/01/2017.  It is unlikely that B12 injection has caused this.  However we will discontinue B12 shots at this time.  She was told to take B12 1  mg tablet daily.  She is also continuing iron tablet. - Denies any bleeding per rectum but has dark stools from taking iron tablet.  Last colonoscopy was many years ago at [facility] when one polyp was removed. -We will see her back in 3 months with repeat blood work.  Her hemoglobin improved to 11.6 after 2 infusions of Feraheme.       Orders placed this encounter:  Orders Placed This Encounter  Procedures  . CBC with Differential/Platelet  . Comprehensive metabolic panel  . Ferritin  . Iron and TIBC  . Vitamin B12      Derek Jack, [place] [phone]

## 2017-09-04 NOTE — Progress Notes (Signed)
Pt reports allergic reaction approximately one hour after rec'ing injection last month.  States she had to go to Urgent Care where she was tx with steroid injection and prednisone dose pack. Will hold B12 injection today per MD.

## 2017-09-14 DIAGNOSIS — R69 Illness, unspecified: Secondary | ICD-10-CM | POA: Diagnosis not present

## 2017-09-17 ENCOUNTER — Other Ambulatory Visit: Payer: Self-pay | Admitting: Family

## 2017-09-17 DIAGNOSIS — K2100 Gastro-esophageal reflux disease with esophagitis, without bleeding: Secondary | ICD-10-CM

## 2017-09-17 DIAGNOSIS — M8580 Other specified disorders of bone density and structure, unspecified site: Secondary | ICD-10-CM

## 2017-09-17 DIAGNOSIS — E1149 Type 2 diabetes mellitus with other diabetic neurological complication: Secondary | ICD-10-CM

## 2017-09-17 DIAGNOSIS — K5900 Constipation, unspecified: Secondary | ICD-10-CM

## 2017-09-18 NOTE — Progress Notes (Signed)
Cardiology Office Note   Date:  09/20/2017   ID:  Denise Gardner, DOB [birth year], MRN [id number]  PCP:  Sharion Balloon, FNP  Cardiologist:   No primary care provider on file. Referring:  Sharion Balloon, FNP  Chief Complaint  Patient presents with  . Coronary Artery Disease      History of Present Illness: Denise Gardner is a 72 y.o. female who presents for evaluation of CAD.  I saw her last in 2014.  she had a stress perfusion study which demonstrated an EF of 71% and questionable inferior defect with questionable artifact vs infarct with mild periinfarct ischemia.  She returns for follow up.  She reports chest discomfort.  She thinks this is been going on since earlier this year.  She is had a long history of chest discomfort and has taken nitroglycerin for years.  She has had previous coronary disease as described below.  She says the pain that she is been getting has been similar to previous discomfort.  She thinks is about the same but perhaps a little more intense and a little more frequent.  She describes a substernal discomfort.  It also radiates across the right side and left side of her upper chest and sometimes to the right upper quadrant.  It happens with rest.  She cannot bring it on.  It lasts for several minutes at a time.  She thinks it does get better with nitroglycerin.  She goes and lies down and it improves.  She thinks it sometimes is severe.  She will get vomiting.  She will feel weak and may be short of breath.  She cannot bring it on with activity.  It does not happen with food.  There is no radiation to her jaw or to her arms.  She is very limited in her activities walking with a cane.      Since that time she has had no change in her symptoms.  She does get some discomfort that is moderate.  It might last for 20 minutes.  She cannot bring it on with activity.  She does activities such as wash clothes without bringing this on.  She does not describe associated  symptoms and she thinks that this is a stable pattern.  She denies any new SOB, PND or orthopnea.      Past Medical History:  Diagnosis Date  . Anxiety    depression  . Asthma   . Cataract   . Chronic back pain   . Coronary artery disease    Taxus stent to RCA 2008 2.5 x 16, non obstructive 15% proximal right coronary artery, patent curcumflex LAD, preserved LV  . Diabetes mellitus without complication ([place])   . GERD (gastroesophageal reflux disease)   . Hyperlipidemia   . Hypertension   . Insomnia   . Migraine   . Neuropathy   . Shingles   . Thyroid disease   . Ulcer, stomach peptic     Past Surgical History:  Procedure Laterality Date  . TOTAL ABDOMINAL HYSTERECTOMY       Current Outpatient Medications  Medication Sig Dispense Refill  . amLODipine (NORVASC) 10 MG tablet TAKE 1 TABLET(10 MG) BY MOUTH DAILY 90 tablet 0  . aspirin 81 MG chewable tablet Chew 81 mg by mouth daily.    . cholecalciferol (VITAMIN D) 400 UNITS TABS Take by mouth. VITAMIN D3 daily     . diazepam (VALIUM) 5 MG tablet TAKE 1 TABLET BY MOUTH EVERY  12 HOURS AS NEEDED 60 tablet 3  . diclofenac sodium (VOLTAREN) 1 % GEL Apply 2 g topically 4 (four) times daily. 100 g 2  . escitalopram (LEXAPRO) 10 MG tablet TAKE 1 TABLET BY MOUTH EVERY DAY 90 tablet 0  . fluticasone (FLONASE) 50 MCG/ACT nasal spray SHAKE LIQUID AND USE 2 SPRAYS IN EACH NOSTRIL DAILY 16 g 5  . gabapentin (NEURONTIN) 300 MG capsule TAKE 1 CAPSULE BY MOUTH THREE TIMES DAILY 270 capsule 0  . hydrOXYzine (ATARAX/VISTARIL) 25 MG tablet TK 1 TO 2 TS PO Q 6 H PRF ITCHING.  0  . ipratropium-albuterol (DUONEB) 0.5-2.5 (3) MG/3ML SOLN USE 1 VIAL VIA NEBULIZER EVERY 6 HOURS 1080 mL 2  . isosorbide mononitrate (IMDUR) 60 MG 24 hr tablet TAKE 1 TABLET BY MOUTH EVERY MORNING 90 tablet 0  . levothyroxine (SYNTHROID, LEVOTHROID) 88 MCG tablet TAKE ONE TABLET BY MOUTH DAILY 90 tablet 3  . metFORMIN (GLUCOPHAGE) 1000 MG tablet TAKE 1 TABLET BY MOUTH  TWICE DAILY WITH A MEAL 180 tablet 0  . metoprolol tartrate (LOPRESSOR) 25 MG tablet TAKE 1 TABLET BY MOUTH TWICE DAILY 180 tablet 0  . misoprostol (CYTOTEC) 200 MCG tablet TAKE 1 TABLET BY MOUTH FOUR TIMES DAILY 360 tablet 0  . montelukast (SINGULAIR) 10 MG tablet TAKE 1 TABLET BY MOUTH EVERY NIGHT AT BEDTIME 90 tablet 0  . naproxen (NAPROSYN) 500 MG tablet TAKE 1 TABLET(500 MG) BY MOUTH TWICE DAILY WITH A MEAL 180 tablet 0  . nitroGLYCERIN (NITROSTAT) 0.4 MG SL tablet DISSOLVE 1 TABLET UNDER THE TONGUE EVERY 5 MINUTES AS NEEDED FOR CHEST PAIN AS DIRECTED 50 tablet 0  . omeprazole (PRILOSEC) 20 MG capsule TAKE 1 CAPSULE(20 MG) BY MOUTH TWICE DAILY BEFORE A MEAL 180 capsule 1  . QVAR REDIHALER 40 MCG/ACT inhaler INHALE 2 PUFFS BY MOUTH INTO THE LUNGS TWICE DAILY 10.6 g 3  . raloxifene (EVISTA) 60 MG tablet TAKE 1 TABLET(60 MG) BY MOUTH DAILY 90 tablet 0  . sitaGLIPtin (JANUVIA) 100 MG tablet Take 1 tablet (100 mg total) by mouth daily. 30 tablet 2  . VASCEPA 1 g CAPS TAKE 4 CAPSULES BY MOUTH EVERY DAY 360 capsule 0  . ACCU-CHEK FASTCLIX LANCETS MISC USE TO CHECK BLOOD GLUCOSE TWICE DAILY 102 each 2  . blood glucose meter kit and supplies KIT Dispense based on patient & insurance preference. Use to check BG up to BID (Dx: type 2 diabete, no insulin E11.49) 1 each 0  . ONE TOUCH ULTRA TEST test strip TEST BLOOD GLUCOSE TWICE DAILY 200 each 3   No current facility-administered medications for this visit.     Allergies:   Milk-related compounds; Eggs or egg-derived products; Other; Crestor [rosuvastatin]; and Zetia [ezetimibe]    Social History:  The patient  reports that she has never smoked. She has never used smokeless tobacco. She reports that she does not drink alcohol or use drugs.   Family History:  The patient's family history includes Cancer in her sister; Coronary artery disease (age of onset: 7) in her brother; Coronary artery disease (age of onset: 72) in her brother; Diabetes in her  brother, brother, brother, sister, sister, and sister; Heart attack in her brother; Heart disease in her brother, brother, brother, brother, brother, brother, mother, sister, sister, sister, and sister; Stroke (age of onset: 61) in her mother.    ROS:  Please see the history of present illness.   Otherwise, review of systems are positive for none.   All other  systems are reviewed and negative.    PHYSICAL EXAM: VS:  BP 120/78   Pulse 68   Ht '5\' 5"'  (1.651 m)   Wt 200 lb (90.7 kg)   BMI 33.28 kg/m  , BMI Body mass index is 33.28 kg/m. GENERAL:  Well appearing HEENT:  Pupils equal round and reactive, fundi not visualized, oral mucosa unremarkable NECK:  No jugular venous distention, waveform within normal limits, carotid upstroke brisk and symmetric, no bruits, no thyromegaly LYMPHATICS:  No cervical, inguinal adenopathy LUNGS:  Clear to auscultation bilaterally BACK:  No CVA tenderness CHEST:  Unremarkable HEART:  PMI not displaced or sustained,S1 and S2 within normal limits, no S3, no S4, no clicks, no rubs, o murmurs ABD:  Flat, positive bowel sounds normal in frequency in pitch, no bruits, no rebound, no guarding, no midline pulsatile mass, no hepatomegaly, no splenomegaly EXT:  2 plus pulses throughout, no edema, no cyanosis no clubbing SKIN:  No rashes no nodules NEURO:  Cranial nerves II through XII grossly intact, motor grossly intact throughout PSYCH:  Cognitively intact, oriented to person place and time    EKG:  EKG is not ordered today. The ekg ordered 05/25/17 demonstrates sinus rhythm, rate 79, axis within normal limits, intervals within normal limits, no acute ST-T wave changes.   Recent Labs: 05/25/2017: TSH 1.460 08/01/2017: ALT 15; BUN 22; Creatinine, Ser 1.07; Hemoglobin 11.6; Platelets 205; Potassium 4.1; Sodium 139    Lipid Panel    Component Value Date/Time   CHOL 166 02/24/2017 1139   CHOL 136 09/05/2012 1256   TRIG 92 02/24/2017 1139   TRIG 43 12/28/2012  0938   TRIG 38 09/05/2012 1256   HDL 57 02/24/2017 1139   HDL 66 12/28/2012 0938   HDL 59 09/05/2012 1256   CHOLHDL 2.9 02/24/2017 1139   LDLCALC 91 02/24/2017 1139   LDLCALC 80 12/28/2012 0938   LDLCALC 69 09/05/2012 1256      Wt Readings from Last 3 Encounters:  09/20/17 200 lb (90.7 kg)  09/04/17 202 lb (91.6 kg)  08/25/17 202 lb 12.8 oz (92 kg)      Other studies Reviewed: Additional studies/ records that were reviewed today include: Previous records from my last visit and Lexiscan Myoview.. Review of the above records demonstrates:  Please see elsewhere in the note.     ASSESSMENT AND PLAN:  CHEST PAIN:     The patient has risk factors and previous known coronary disease.  I would like to screen her with a stress test.  She would be able walk on a treadmill.  Therefore, she will have a [employer].  HTN:  The blood pressure is at target. No change in medications is indicated. We will continue with therapeutic lifestyle changes (TLC).  DYSLIPIDEMIA:  Her LDL is slightly higher than target.  I will suggest follow up lipids with the stress test.       Current medicines are reviewed at length with the patient today.  The patient does not have concerns regarding medicines.  The following changes have been made:  no change  Labs/ tests ordered today include:   Orders Placed This Encounter  Procedures  . NM Myocar Multi W/Spect W/Wall Motion / EF  . Lipid panel     Disposition:   FU with me based on the results of the above.     Signed, Minus Breeding, MD  09/20/2017 1:13 PM    [place]

## 2017-09-20 ENCOUNTER — Ambulatory Visit (INDEPENDENT_AMBULATORY_CARE_PROVIDER_SITE_OTHER): Payer: Medicare HMO | Admitting: Cardiology

## 2017-09-20 ENCOUNTER — Encounter: Payer: Self-pay | Admitting: Cardiology

## 2017-09-20 VITALS — BP 120/78 | HR 68 | Ht 65.0 in | Wt 200.0 lb

## 2017-09-20 DIAGNOSIS — R079 Chest pain, unspecified: Secondary | ICD-10-CM | POA: Diagnosis not present

## 2017-09-20 DIAGNOSIS — I10 Essential (primary) hypertension: Secondary | ICD-10-CM | POA: Diagnosis not present

## 2017-09-20 DIAGNOSIS — E785 Hyperlipidemia, unspecified: Secondary | ICD-10-CM | POA: Diagnosis not present

## 2017-09-20 DIAGNOSIS — E1169 Type 2 diabetes mellitus with other specified complication: Secondary | ICD-10-CM

## 2017-09-20 NOTE — Patient Instructions (Addendum)
Medication Instructions:  The current medical regimen is effective;  continue present plan and medications.  Lab: Please have fasting Lipid panel the same day of your stress test.  Testing/Procedures: Your physician has requested that you have a lexiscan myoview.  Please [facility] to [facility] at 9:15 am the morning of your test.  Nothing to eat or drink after midnight the before.  You make take your medications as listed with sips of water.  Hold medications for diabetes this am.  Follow-Up: Follow up as needed after the above testing.  If you need a refill on your cardiac medications before your next appointment, please call your pharmacy.  Thank you for choosing [facility]!!

## 2017-09-26 ENCOUNTER — Encounter (HOSPITAL_BASED_OUTPATIENT_CLINIC_OR_DEPARTMENT_OTHER)
Admission: RE | Admit: 2017-09-26 | Discharge: 2017-09-26 | Disposition: A | Discharge status: Home / Self Care | Payer: Medicare HMO | Source: Ambulatory Visit | Attending: Cardiology | Admitting: Cardiology

## 2017-09-26 ENCOUNTER — Encounter (HOSPITAL_COMMUNITY)
Admission: RE | Admit: 2017-09-26 | Discharge: 2017-09-26 | Disposition: A | Discharge status: Home / Self Care | Payer: Medicare HMO | Source: Ambulatory Visit | Attending: Cardiology | Admitting: Cardiology

## 2017-09-26 ENCOUNTER — Encounter (HOSPITAL_COMMUNITY): Payer: Self-pay

## 2017-09-26 DIAGNOSIS — R079 Chest pain, unspecified: Secondary | ICD-10-CM | POA: Diagnosis not present

## 2017-09-26 LAB — NM MYOCAR MULTI W/SPECT W/WALL MOTION / EF
LV dias vol: 58 mL (ref 46–106)
LV sys vol: 16 mL
Peak HR: 90 {beats}/min
RATE: 0.41
Rest HR: 64 {beats}/min
SDS: 1
SRS: 0
SSS: 1
TID: 1.1

## 2017-09-26 MED ORDER — TECHNETIUM TC 99M TETROFOSMIN IV KIT
10.0000 | PACK | Freq: Once | INTRAVENOUS | Status: AC | PRN
  Administered 2017-09-26: 11 via INTRAVENOUS

## 2017-09-26 MED ORDER — REGADENOSON 0.4 MG/5ML IV SOLN
INTRAVENOUS | Status: AC
Start: 2017-09-26 — End: 2017-09-26
  Administered 2017-09-26: 0.4 mg via INTRAVENOUS
  Filled 2017-09-26: qty 5

## 2017-09-26 MED ORDER — SODIUM CHLORIDE 0.9% FLUSH
INTRAVENOUS | Status: AC
  Administered 2017-09-26: 10 mL via INTRAVENOUS
  Filled 2017-09-26: qty 10

## 2017-09-26 MED ORDER — TECHNETIUM TC 99M TETROFOSMIN IV KIT
30.0000 | PACK | Freq: Once | INTRAVENOUS | Status: AC | PRN
  Administered 2017-09-26: 32 via INTRAVENOUS

## 2017-10-05 ENCOUNTER — Other Ambulatory Visit: Payer: Self-pay | Admitting: Family

## 2017-10-05 DIAGNOSIS — R69 Illness, unspecified: Secondary | ICD-10-CM | POA: Diagnosis not present

## 2017-10-09 DIAGNOSIS — R69 Illness, unspecified: Secondary | ICD-10-CM | POA: Diagnosis not present

## 2017-10-10 DIAGNOSIS — R69 Illness, unspecified: Secondary | ICD-10-CM | POA: Diagnosis not present

## 2017-10-12 ENCOUNTER — Other Ambulatory Visit: Payer: Self-pay | Admitting: Family

## 2017-10-17 ENCOUNTER — Other Ambulatory Visit: Payer: Self-pay | Admitting: Family

## 2017-10-17 ENCOUNTER — Ambulatory Visit (INDEPENDENT_AMBULATORY_CARE_PROVIDER_SITE_OTHER): Payer: Medicare HMO | Admitting: *Deleted

## 2017-10-17 ENCOUNTER — Encounter: Payer: Self-pay | Admitting: *Deleted

## 2017-10-17 VITALS — BP 120/65 | HR 64 | Ht 64.5 in | Wt 198.0 lb

## 2017-10-17 DIAGNOSIS — Z0000 Encounter for general adult medical examination without abnormal findings: Secondary | ICD-10-CM | POA: Diagnosis not present

## 2017-10-17 NOTE — Progress Notes (Addendum)
Subjective:   Denise Gardner is a 72 y.o. female who presents for a Medicare Annual Wellness Visit. Denise Gardner is currently staying with her ex-husband since the ceiling on her mobile home fell in from water damage. She intends to move back out on her own once that is repaired. She does not have any children.    Review of Systems    Patient reports that her overall health is unchanged compared to last year.  Cardiac Risk Factors include: advanced age (>34mn, >>72women);hypertension;female gender;obesity (BMI >30kg/m2);sedentary lifestyle;dyslipidemia;diabetes mellitus   All other systems negative       Current Medications (verified) Outpatient Encounter Medications as of 10/17/2017  Medication Sig  . ACCU-CHEK AVIVA PLUS test strip USE TO CHECK BLOOD GLUCOSE TWICE DAILY AS DIRECTED  . ACCU-CHEK FASTCLIX LANCETS MISC USE TO CHECK BLOOD GLUCOSE TWICE DAILY  . acetaminophen (TYLENOL) 500 MG tablet Take 500 mg by mouth every 6 (six) hours as needed.  .Marland KitchenamLODipine (NORVASC) 10 MG tablet TAKE 1 TABLET(10 MG) BY MOUTH DAILY  . aspirin 81 MG chewable tablet Chew 81 mg by mouth daily.  . blood glucose meter kit and supplies KIT Dispense based on patient & insurance preference. Use to check BG up to BID (Dx: type 2 diabete, no insulin E11.49)  . Calcium-Magnesium-Vitamin D (CALCIUM 1200+D3 PO) Take 1 capsule by mouth.  . cholecalciferol (VITAMIN D) 400 UNITS TABS Take by mouth. VITAMIN D3 daily   . Cranberry-Vitamin C-Probiotic (AZO CRANBERRY) 250-30 MG TABS Take by mouth.  . diazepam (VALIUM) 5 MG tablet TAKE 1 TABLET BY MOUTH EVERY 12 HOURS AS NEEDED  . diclofenac sodium (VOLTAREN) 1 % GEL Apply 2 g topically 4 (four) times daily.  . diphenhydrAMINE (BENADRYL) 25 mg capsule Take 25 mg by mouth every 6 (six) hours as needed.  . docusate sodium (COLACE) 100 MG capsule Take 100 mg by mouth 2 (two) times daily.  .Marland Kitchenescitalopram (LEXAPRO) 10 MG tablet TAKE 1 TABLET BY MOUTH EVERY DAY  . Ferrous Sulfate  (IRON) 325 (65 Fe) MG TABS Take 1 tablet by mouth.  . fluticasone (FLONASE) 50 MCG/ACT nasal spray SHAKE LIQUID AND USE 2 SPRAYS IN EACH NOSTRIL DAILY  . gabapentin (NEURONTIN) 300 MG capsule TAKE 1 CAPSULE BY MOUTH THREE TIMES DAILY  . hydrOXYzine (ATARAX/VISTARIL) 25 MG tablet TK 1 TO 2 TS PO Q 6 H PRF ITCHING.  .Marland Kitchenipratropium-albuterol (DUONEB) 0.5-2.5 (3) MG/3ML SOLN USE 1 VIAL VIA NEBULIZER EVERY 6 HOURS  . isosorbide mononitrate (IMDUR) 60 MG 24 hr tablet TAKE 1 TABLET BY MOUTH EVERY MORNING  . levothyroxine (SYNTHROID, LEVOTHROID) 88 MCG tablet TAKE ONE TABLET BY MOUTH DAILY  . metFORMIN (GLUCOPHAGE) 1000 MG tablet TAKE 1 TABLET BY MOUTH TWICE DAILY WITH A MEAL  . metoprolol tartrate (LOPRESSOR) 25 MG tablet TAKE 1 TABLET BY MOUTH TWICE DAILY  . misoprostol (CYTOTEC) 200 MCG tablet TAKE 1 TABLET BY MOUTH FOUR TIMES DAILY  . montelukast (SINGULAIR) 10 MG tablet TAKE 1 TABLET BY MOUTH EVERY NIGHT AT BEDTIME  . Multiple Vitamins-Minerals (HAIR SKIN AND NAILS FORMULA PO) Take 1 tablet by mouth daily.  . naproxen (NAPROSYN) 500 MG tablet TAKE 1 TABLET(500 MG) BY MOUTH TWICE DAILY WITH A MEAL  . nitroGLYCERIN (NITROSTAT) 0.4 MG SL tablet DISSOLVE 1 TABLET UNDER THE TONGUE EVERY 5 MINUTES AS NEEDED FOR CHEST PAIN AS DIRECTED  . omeprazole (PRILOSEC) 20 MG capsule TAKE 1 CAPSULE(20 MG) BY MOUTH TWICE DAILY BEFORE A MEAL  . psyllium (REGULOID) 0.52  g capsule Take 0.52 g by mouth daily.  . raloxifene (EVISTA) 60 MG tablet TAKE 1 TABLET(60 MG) BY MOUTH DAILY  . sitaGLIPtin (JANUVIA) 100 MG tablet Take 1 tablet (100 mg total) by mouth daily.  Marland Kitchen VASCEPA 1 g CAPS TAKE 4 CAPSULES BY MOUTH EVERY DAY  . vitamin B-12 (CYANOCOBALAMIN) 1000 MCG tablet Take 1,000 mcg by mouth daily.  . [DISCONTINUED] QVAR REDIHALER 40 MCG/ACT inhaler INHALE 2 PUFFS BY MOUTH INTO THE LUNGS TWICE DAILY   No facility-administered encounter medications on file as of 10/17/2017.     Allergies (verified) Milk-related compounds;  Eggs or egg-derived products; Other; Crestor [rosuvastatin]; and Zetia [ezetimibe]   History: Past Medical History:  Diagnosis Date  . Anxiety    depression  . Asthma   . Cataract   . Chronic back pain   . Coronary artery disease    Taxus stent to RCA 2008 2.5 x 16, non obstructive 15% proximal right coronary artery, patent curcumflex LAD, preserved LV  . Diabetes mellitus without complication ([place])   . GERD (gastroesophageal reflux disease)   . Hyperlipidemia   . Hypertension   . Insomnia   . Migraine   . Neuropathy   . Shingles   . Thyroid disease   . Ulcer, stomach peptic    Past Surgical History:  Procedure Laterality Date  . TOTAL ABDOMINAL HYSTERECTOMY     Family History  Problem Relation Age of Onset  . Stroke Mother 31  . Heart disease Mother   . Coronary artery disease Brother 49  . Diabetes Brother   . Heart disease Brother   . Coronary artery disease Brother 91  . Diabetes Brother   . Heart disease Brother   . Cancer Sister        breast  . Heart disease Sister   . Diabetes Sister   . Heart disease Sister   . Diabetes Sister   . Heart disease Sister   . Diabetes Sister   . Heart disease Sister   . Diabetes Brother   . Heart disease Brother   . Heart attack Brother   . Heart disease Brother   . Heart disease Brother   . Heart disease Brother    Social History   Socioeconomic History  . Marital status: Married    Spouse name: Not on file  . Number of children: Not on file  . Years of education: Not on file  . Highest education level: Not on file  Occupational History  . Occupation: retired    Comment: Higher education careers adviser  . Financial resource strain: Not on file  . Food insecurity:    Worry: Not on file    Inability: Not on file  . Transportation needs:    Medical: Not on file    Non-medical: Not on file  Tobacco Use  . Smoking status: Never Smoker  . Smokeless tobacco: Never Used  Substance and Sexual Activity  . Alcohol  use: No  . Drug use: No  . Sexual activity: Not Currently  Lifestyle  . Physical activity:    Days per week: Not on file    Minutes per session: Not on file  . Stress: Not on file  Relationships  . Social connections:    Talks on phone: Not on file    Gets together: Not on file    Attends religious service: Not on file    Active member of club or organization: Not on file  Attends meetings of clubs or organizations: Not on file    Relationship status: Not on file  Other Topics Concern  . Not on file  Social History Narrative   Has a husband but has not lived with him for 31 years.  No children.      Tobacco Use No.  Clinical Intake:  Pre-visit preparation completed: No  Pain : 0-10 Pain Type: Chronic pain Pain Location: Foot Pain Orientation: Left, Right Pain Descriptors / Indicators: Numbness, Pins and needles, Tingling Pain Onset: More than a month ago Pain Frequency: Constant Pain Relieving Factors: Neurontin Effect of Pain on Daily Activities: moderate  Pain Relieving Factors: Neurontin  Nutritional Status: BMI > 30  Obese Diabetes: Yes CBG done?: No Did pt. bring in CBG monitor from home?: No  How often do you need to have someone help you when you read instructions, pamphlets, or other written materials from your doctor or pharmacy?: 3 - Sometimes(problems with vision) What is the last grade level you completed in school?: 5th grade  Interpreter Needed?: No  Information entered by :: Chong Sicilian, RN   Activities of Daily Living In your present state of health, do you have any difficulty performing the following activities: 10/17/2017  Hearing? N  Vision? Y  Difficulty concentrating or making decisions? N  Walking or climbing stairs? Y  Comment uses cane  Dressing or bathing? N  Doing errands, shopping? Y  Comment has help with Copywriter, advertising and eating ? N  Using the Toilet? N  In the past six months, have you accidently  leaked urine? N  Do you have problems with loss of bowel control? N  Managing your Medications? N  Managing your Finances? N  Housekeeping or managing your Housekeeping? N  Some recent data might be hidden     Diet 3 meals a day  Exercise Current Exercise Habits: The patient does not participate in regular exercise at present, Exercise limited by: neurologic condition(s)   Depression Screen PHQ 2/9 Scores 10/17/2017 08/25/2017 05/25/2017 02/24/2017 11/21/2016 10/03/2016 08/19/2016  PHQ - 2 Score 1 1 0 0 0 1 0     Fall Risk Fall Risk  10/17/2017 08/25/2017 05/25/2017 02/24/2017 11/21/2016  Falls in the past year? Yes Yes No No No  Number falls in past yr: 2 or more 2 or more - - -  Injury with Fall? - - - - -  Comment - - - - -  Risk Factor Category  High Fall Risk - - - -  Risk for fall due to : Impaired balance/gait;History of fall(s) - - - -  Follow up Falls prevention discussed - - - -    Safety Is the patient's home free of loose throw rugs in walkways, pet beds, electrical cords, etc?   yes      Grab bars in the bathroom? no      Walkin shower? no      Shower Seat? no      Handrails on the stairs?   no      Adequate lighting?   yes  Patient Care Team: Sharion Balloon, FNP as PCP - General (Nurse Practitioner) Minus Breeding, MD as Attending Physician (Cardiology) Melina Schools, OD (Optometry)  Hospitalizations, surgeries, and ER visits in previous 12 months No hospitalizations, ER visits, or surgeries this past year.  Objective:    Today's Vitals   10/17/17 1128  BP: 120/65  Pulse: 64  Weight: 198 lb (89.8 kg)  Height:  5' 4.5" (1.638 m)   Body mass index is 33.46 kg/m.  Advanced Directives 10/17/2017 09/04/2017 08/01/2017 07/12/2017 06/29/2017 10/03/2016 01/27/2015  Does Patient Have a Medical Advance Directive? _0  No No  Would patient like information on creating a medical advance directive? Yes (MAU/Ambulatory/Procedural Areas - Information given) No -  Patient declined No - Patient declined No - Patient declined No - Patient declined Yes (MAU/Ambulatory/Procedural Areas - Information given) No - patient declined information    Hearing/Vision  No hearing or vision deficits noted during visit.  Cognitive Function: MMSE - Mini Mental State Exam 10/17/2017 10/17/2017 10/03/2016 11/25/2015  Orientation to time _1 Orientation to Place _2 Registration _3 Attention/ Calculation _4 Recall _5 Language- name 2 objects _6 Language- repeat _7 Language- follow 3 step command _8 Language- read & follow direction _9 Write a sentence 1 0 1 1  Copy design _10 Total score _11 Normal Cognitive Function Screening: Yes    Immunizations and Health Maintenance Immunization History  Administered Date(s) Administered  . Influenza, Quadrivalent, Recombinant, Inj, Pf 03/30/2016, 12/28/2016  . Pneumococcal Conjugate-13 12/20/2013  . Pneumococcal Polysaccharide-23 10/22/2010  . Tdap 09/05/2012   Health Maintenance Due  Topic Date Due  . OPHTHALMOLOGY EXAM  09/15/2017  . INFLUENZA VACCINE  09/28/2017   Health Maintenance  Topic Date Due  . OPHTHALMOLOGY EXAM  09/15/2017  . INFLUENZA VACCINE  09/28/2017  . HEMOGLOBIN A1C  11/25/2017  . MAMMOGRAM  03/30/2018  . FOOT EXAM  05/26/2018  . URINE MICROALBUMIN  05/26/2018  . DEXA SCAN  10/14/2018  . TETANUS/TDAP  09/06/2022  . COLONOSCOPY  07/02/2025  . Hepatitis C Screening  Completed  . PNA vac Low Risk Adult  Completed        Assessment:   This is a routine wellness examination for Denise Gardner.    Plan:    Goals    . Exercise 3x per week (30 min per time)     Walk for 30 minutes at least 3 times week     . Prevent falls     Move slowly and change position slowly to prevent falls. Use your cane or a walker at all times.         Health Maintenance Recommendations: Eye exam   Additional Screening  Recommendations: Lung: Low Dose CT Chest recommended if Age 44-80 years, 30 pack-year currently smoking OR have quit w/in 15years. Patient does not qualify. Hepatitis C Screening recommended: no  Keep f/u with Sharion Balloon, FNP and any other specialty appointments you may have Continue current medications Move carefully to avoid falls. Use assistive devices like a cane or walker if needed. Aim for at least 150 minutes of moderate activity a week. This can be done with chair exercises if necessary. Read or work on puzzles daily Stay connected with friends and family Advance directives given and explained  I have personally reviewed and noted the following in the patient's chart:   . Medical and social history . Use of alcohol, tobacco or illicit drugs  . Current medications and supplements . Functional ability and status . Nutritional status . Physical activity . Advanced directives . List of other physicians . Hospitalizations, surgeries, and ER  visits in previous 12 months . Vitals . Screenings to include cognitive, depression, and falls . Referrals and appointments  In addition, I have reviewed and discussed with patient certain preventive protocols, quality metrics, and best practice recommendations. A written personalized care plan for preventive services as well as general preventive health recommendations were provided to patient.     Chong Sicilian, RN   10/17/2017    I have reviewed and agree with the above AWV documentation.   Evelina Dun, FNP

## 2017-10-17 NOTE — Patient Instructions (Addendum)
  Denise Gardner , Thank you for taking time to come for your Medicare Wellness Visit. I appreciate your ongoing commitment to your health goals. Please review the following plan we discussed and let me know if I can assist you in the future.   These are the goals we discussed: Goals    . Exercise 3x per week (30 min per time)     Walk for 30 minutes at least 3 times week     . Prevent falls     Move slowly and change position slowly to prevent falls. Use your cane or a walker at all times.        This is a list of the screening recommended for you and due dates:  Health Maintenance  Topic Date Due  . Eye exam for diabetics  09/15/2017  . Flu Shot  09/28/2017  . Hemoglobin A1C  11/25/2017  . Mammogram  03/30/2018  . Complete foot exam   05/26/2018  . Urine Protein Check  05/26/2018  . DEXA scan (bone density measurement)  10/14/2018  . Tetanus Vaccine  09/06/2022  . Colon Cancer Screening  07/02/2025  .  Hepatitis C: One time screening is recommended by Center for Disease Control  (CDC) for  adults born from [birth year] through 1967.   Completed  . Pneumonia vaccines  Completed

## 2017-11-08 ENCOUNTER — Other Ambulatory Visit: Payer: Self-pay | Admitting: Family

## 2017-11-08 DIAGNOSIS — K5900 Constipation, unspecified: Secondary | ICD-10-CM

## 2017-11-08 DIAGNOSIS — M8580 Other specified disorders of bone density and structure, unspecified site: Secondary | ICD-10-CM

## 2017-11-08 DIAGNOSIS — E1149 Type 2 diabetes mellitus with other diabetic neurological complication: Secondary | ICD-10-CM

## 2017-11-24 ENCOUNTER — Ambulatory Visit (INDEPENDENT_AMBULATORY_CARE_PROVIDER_SITE_OTHER): Payer: Medicare HMO | Admitting: Family

## 2017-11-24 ENCOUNTER — Encounter: Payer: Self-pay | Admitting: Family

## 2017-11-24 VITALS — BP 124/75 | HR 67 | Temp 95.1°F | Ht 64.5 in | Wt 197.4 lb

## 2017-11-24 DIAGNOSIS — R69 Illness, unspecified: Secondary | ICD-10-CM | POA: Diagnosis not present

## 2017-11-24 DIAGNOSIS — I2510 Atherosclerotic heart disease of native coronary artery without angina pectoris: Secondary | ICD-10-CM | POA: Diagnosis not present

## 2017-11-24 DIAGNOSIS — R309 Painful micturition, unspecified: Secondary | ICD-10-CM | POA: Diagnosis not present

## 2017-11-24 DIAGNOSIS — E039 Hypothyroidism, unspecified: Secondary | ICD-10-CM

## 2017-11-24 DIAGNOSIS — I152 Hypertension secondary to endocrine disorders: Secondary | ICD-10-CM

## 2017-11-24 DIAGNOSIS — E1169 Type 2 diabetes mellitus with other specified complication: Secondary | ICD-10-CM

## 2017-11-24 DIAGNOSIS — E669 Obesity, unspecified: Secondary | ICD-10-CM | POA: Diagnosis not present

## 2017-11-24 DIAGNOSIS — E1159 Type 2 diabetes mellitus with other circulatory complications: Secondary | ICD-10-CM

## 2017-11-24 DIAGNOSIS — J449 Chronic obstructive pulmonary disease, unspecified: Secondary | ICD-10-CM

## 2017-11-24 DIAGNOSIS — F419 Anxiety disorder, unspecified: Secondary | ICD-10-CM | POA: Diagnosis not present

## 2017-11-24 DIAGNOSIS — I10 Essential (primary) hypertension: Secondary | ICD-10-CM

## 2017-11-24 DIAGNOSIS — D509 Iron deficiency anemia, unspecified: Secondary | ICD-10-CM

## 2017-11-24 DIAGNOSIS — E1142 Type 2 diabetes mellitus with diabetic polyneuropathy: Secondary | ICD-10-CM

## 2017-11-24 DIAGNOSIS — J45909 Unspecified asthma, uncomplicated: Secondary | ICD-10-CM

## 2017-11-24 DIAGNOSIS — E785 Hyperlipidemia, unspecified: Secondary | ICD-10-CM

## 2017-11-24 DIAGNOSIS — N3001 Acute cystitis with hematuria: Secondary | ICD-10-CM

## 2017-11-24 DIAGNOSIS — F331 Major depressive disorder, recurrent, moderate: Secondary | ICD-10-CM

## 2017-11-24 DIAGNOSIS — M791 Myalgia, unspecified site: Secondary | ICD-10-CM

## 2017-11-24 LAB — MICROSCOPIC EXAMINATION

## 2017-11-24 LAB — URINALYSIS, COMPLETE
Bilirubin, UA: NEGATIVE
Glucose, UA: NEGATIVE
Leukocytes, UA: NEGATIVE
Nitrite, UA: NEGATIVE
Specific Gravity, UA: 1.025 (ref 1.005–1.030)
Urobilinogen, Ur: 0.2 mg/dL (ref 0.2–1.0)
pH, UA: 5.5 (ref 5.0–7.5)

## 2017-11-24 LAB — BAYER DCA HB A1C WAIVED: HB A1C (BAYER DCA - WAIVED): 6 % (ref <7.0)

## 2017-11-24 MED ORDER — CEPHALEXIN 500 MG PO CAPS: 500.0000 mg | ORAL_CAPSULE | Freq: Two times a day (BID) | ORAL | 0 refills | Status: DC

## 2017-11-24 MED ORDER — BLOOD GLUCOSE METER KIT: PACK | 0 refills | Status: DC

## 2017-11-24 NOTE — Progress Notes (Signed)
Subjective:    Patient ID: Denise Gardner, female    DOB: 1945/09/14, 72 y.o.   MRN: [id number]  Chief Complaint  Patient presents with  . Medical Management of Chronic Issues  . Diabetes  . Pain passing urine   Pt presents to the office today for chronic follow up. Followed by  Cardiologists annually. Pt is followed by Hematologists for anemia.   Diabetes  She presents for her follow-up diabetic visit. She has type 2 diabetes mellitus. Hypoglycemia symptoms include nervousness/anxiousness. Associated symptoms include blurred vision, fatigue and foot paresthesias. There are no hypoglycemic complications. Symptoms are stable. Diabetic complications include heart disease and nephropathy. Pertinent negatives for diabetic complications include no CVA. Risk factors for coronary artery disease include dyslipidemia, diabetes mellitus, female sex, hypertension and sedentary lifestyle. Her overall blood glucose range is 110-130 mg/dl. Eye exam is not current.  Hypertension  This is a chronic problem. The current episode started more than 1 year ago. The problem has been waxing and waning since onset. The problem is controlled. Associated symptoms include anxiety, blurred vision, malaise/fatigue, peripheral edema and shortness of breath. Risk factors for coronary artery disease include diabetes mellitus, dyslipidemia and obesity. Hypertensive end-organ damage includes CAD/MI. There is no history of kidney disease or CVA. Identifiable causes of hypertension include a thyroid problem.  Hyperlipidemia  This is a chronic problem. The current episode started more than 1 year ago. The problem is controlled. Recent lipid tests were reviewed and are normal. Exacerbating diseases include obesity. Associated symptoms include shortness of breath. Current antihyperlipidemic treatment includes herbal therapy. The current treatment provides mild improvement of lipids. Risk factors for coronary artery disease include diabetes  mellitus, dyslipidemia, hypertension, a sedentary lifestyle and post-menopausal.  Thyroid Problem  Presents for follow-up visit. Symptoms include anxiety, depressed mood, fatigue, hoarse voice, leg swelling and weight gain. The symptoms have been stable. Her past medical history is significant for hyperlipidemia.  Asthma  She complains of cough, hoarse voice, shortness of breath and wheezing. This is a chronic problem. The current episode started more than 1 year ago. The problem occurs intermittently. The problem has been waxing and waning. Associated symptoms include malaise/fatigue. Her past medical history is significant for asthma.  Dysuria   This is a chronic problem. The current episode started 1 to 4 weeks ago. The problem occurs intermittently. The problem has been waxing and waning. The quality of the pain is described as burning. Associated symptoms include frequency, hesitancy and urgency. Pertinent negatives include no flank pain.  Anxiety  Presents for follow-up visit. Symptoms include depressed mood, excessive worry, irritability, nervous/anxious behavior and shortness of breath. Symptoms occur constantly. The severity of symptoms is moderate.   Her past medical history is significant for asthma.      Review of Systems  Constitutional: Positive for fatigue, irritability, malaise/fatigue and weight gain.  HENT: Positive for hoarse voice.   Eyes: Positive for blurred vision.  Respiratory: Positive for cough, shortness of breath and wheezing.   Genitourinary: Positive for dysuria, frequency, hesitancy and urgency. Negative for flank pain.  Psychiatric/Behavioral: The patient is nervous/anxious.   All other systems reviewed and are negative.      Objective:   Physical Exam  Constitutional: She is oriented to person, place, and time. She appears well-developed and well-nourished. No distress.  HENT:  Head: Normocephalic and atraumatic.  Right Ear: External ear normal.  Left  Ear: External ear normal.  Mouth/Throat: Oropharynx is clear and moist.  Eyes: Pupils are equal,  round, and reactive to light.  Neck: Normal range of motion. Neck supple. No thyromegaly present.  Cardiovascular: Normal rate, regular rhythm, normal heart sounds and intact distal pulses.  No murmur heard. Pulmonary/Chest: Effort normal and breath sounds normal. No respiratory distress. She has no wheezes.  Abdominal: Soft. Bowel sounds are normal. She exhibits no distension. There is no tenderness.  Musculoskeletal: She exhibits edema (trace BLE). She exhibits no tenderness.  Generalized weakness, using cane to walk   Neurological: She is alert and oriented to person, place, and time. She has normal reflexes. No cranial nerve deficit.  Skin: Skin is warm and dry.  Psychiatric: She has a normal mood and affect. Her behavior is normal. Judgment and thought content normal.  Vitals reviewed.    BP 124/75   Pulse 67   Temp (!) 95.1 F (35.1 C) (Oral)   Ht 5' 4.5" (1.638 m)   Wt 197 lb 6.4 oz (89.5 kg)   BMI 33.36 kg/m      Assessment & Plan:  Denise Gardner comes in today with chief complaint of Medical Management of Chronic Issues; Diabetes; and Pain passing urine   Diagnosis and orders addressed:  1. Pain passing urine - Urine Culture - Urinalysis, Complete - CMP14+EGFR  2. Anxiety - CMP14+EGFR  3. Uncomplicated asthma, unspecified asthma severity, unspecified whether persistent - CMP14+EGFR  4. Chronic coronary artery disease - CMP14+EGFR  5. Chronic obstructive pulmonary disease, unspecified COPD type ([place]) - CMP14+EGFR  6. Moderate episode of recurrent major depressive disorder ([facility]) - CMP14+EGFR  7. Type 2 diabetes mellitus with diabetic polyneuropathy, without long-term current use of insulin ([facility]) - CMP14+EGFR - Bayer DCA Hb A1c Waived - blood glucose meter kit and supplies; Dispense based on patient and insurance preference. Use up to four times daily as  directed. (FOR ICD-10 E10.9, E11.9).  Dispense: 1 each; Refill: 0  8. Obesity (BMI 30-39.9) - CMP14+EGFR  9. Diabetic polyneuropathy associated with type 2 diabetes mellitus ([place]) - CMP14+EGFR  10. Iron deficiency anemia, unspecified iron deficiency anemia type - CMP14+EGFR  11. Hypothyroidism, unspecified type - CMP14+EGFR - TSH  12. Hypertension associated with diabetes ([place]) - CMP14+EGFR  13. Hyperlipidemia associated with type 2 diabetes mellitus ([facility]) - CMP14+EGFR - Lipid panel  14. Myalgia - CMP14+EGFR  15. Acute cystitis with hematuria Force fluids RTO prn Culture pending - cephALEXin (KEFLEX) 500 MG capsule; Take 1 capsule (500 mg total) by mouth 2 (two) times daily.  Dispense: 14 capsule; Refill: 0   Labs pending Health Maintenance reviewed Diet and exercise encouraged  Follow up plan: 4 months    Evelina Dun, FNP

## 2017-11-24 NOTE — Patient Instructions (Signed)

## 2017-11-25 LAB — TSH: TSH: 6.99 u[IU]/mL — ABNORMAL HIGH (ref 0.450–4.500)

## 2017-11-25 LAB — LIPID PANEL
Chol/HDL Ratio: 4 ratio (ref 0.0–4.4)
Cholesterol, Total: 190 mg/dL (ref 100–199)
HDL: 47 mg/dL (ref >39)
LDL Calculated: 119 mg/dL — ABNORMAL HIGH (ref 0–99)
Triglycerides: 118 mg/dL (ref 0–149)
VLDL Cholesterol Cal: 24 mg/dL (ref 5–40)

## 2017-11-25 LAB — CMP14+EGFR
ALT: 10 IU/L (ref 0–32)
AST: 16 IU/L (ref 0–40)
Albumin/Globulin Ratio: 1.6 (ref 1.2–2.2)
Albumin: 3.8 g/dL (ref 3.5–4.8)
Alkaline Phosphatase: 35 IU/L — ABNORMAL LOW (ref 39–117)
BUN/Creatinine Ratio: 16 (ref 12–28)
BUN: 17 mg/dL (ref 8–27)
Bilirubin Total: 0.3 mg/dL (ref 0.0–1.2)
CO2: 26 mmol/L (ref 20–29)
Calcium: 9.6 mg/dL (ref 8.7–10.3)
Chloride: 102 mmol/L (ref 96–106)
Creatinine, Ser: 1.06 mg/dL — ABNORMAL HIGH (ref 0.57–1.00)
GFR calc Af Amer: 60 mL/min/{1.73_m2} (ref >59)
GFR calc non Af Amer: 52 mL/min/{1.73_m2} — ABNORMAL LOW (ref >59)
Globulin, Total: 2.4 g/dL (ref 1.5–4.5)
Glucose: 123 mg/dL — ABNORMAL HIGH (ref 65–99)
Potassium: 4.2 mmol/L (ref 3.5–5.2)
Sodium: 143 mmol/L (ref 134–144)
Total Protein: 6.2 g/dL (ref 6.0–8.5)

## 2017-11-27 ENCOUNTER — Telehealth: Payer: Self-pay | Admitting: Family

## 2017-11-27 ENCOUNTER — Other Ambulatory Visit: Payer: Self-pay | Admitting: Family

## 2017-11-27 DIAGNOSIS — R69 Illness, unspecified: Secondary | ICD-10-CM | POA: Diagnosis not present

## 2017-11-27 DIAGNOSIS — E1149 Type 2 diabetes mellitus with other diabetic neurological complication: Secondary | ICD-10-CM

## 2017-11-27 MED ORDER — LEVOTHYROXINE SODIUM 100 MCG PO TABS: 100.0000 ug | ORAL_TABLET | Freq: Every day | ORAL | 11 refills | Status: DC

## 2017-11-27 NOTE — Telephone Encounter (Signed)
Patient says she has enough test strips to last for a while.  Pharmacy said she was trying to get a refill on them too soon.

## 2017-11-27 NOTE — Telephone Encounter (Signed)
Called from pharmacy about testing supplies that the patient brought in a rx for

## 2017-11-28 ENCOUNTER — Ambulatory Visit: Payer: Medicare HMO | Admitting: Family

## 2017-11-30 LAB — URINE CULTURE

## 2017-12-05 ENCOUNTER — Ambulatory Visit (HOSPITAL_COMMUNITY): Payer: Medicare HMO | Admitting: Hematology

## 2017-12-05 ENCOUNTER — Other Ambulatory Visit (HOSPITAL_COMMUNITY): Payer: Medicare HMO

## 2017-12-06 ENCOUNTER — Other Ambulatory Visit: Payer: Self-pay | Admitting: Family

## 2017-12-07 ENCOUNTER — Encounter (HOSPITAL_COMMUNITY): Payer: Self-pay | Admitting: Hematology

## 2017-12-07 ENCOUNTER — Inpatient Hospital Stay (HOSPITAL_COMMUNITY): Payer: Medicare HMO | Attending: Hematology

## 2017-12-07 ENCOUNTER — Encounter: Payer: Self-pay | Admitting: Family

## 2017-12-07 ENCOUNTER — Inpatient Hospital Stay (HOSPITAL_BASED_OUTPATIENT_CLINIC_OR_DEPARTMENT_OTHER): Payer: Medicare HMO | Admitting: Hematology

## 2017-12-07 ENCOUNTER — Ambulatory Visit (INDEPENDENT_AMBULATORY_CARE_PROVIDER_SITE_OTHER): Payer: Medicare HMO | Admitting: Family

## 2017-12-07 VITALS — BP 135/63 | HR 65 | Temp 97.6°F | Resp 18 | Wt 198.0 lb

## 2017-12-07 VITALS — BP 126/66 | HR 69 | Temp 97.4°F | Ht 64.5 in | Wt 199.4 lb

## 2017-12-07 DIAGNOSIS — E538 Deficiency of other specified B group vitamins: Secondary | ICD-10-CM | POA: Insufficient documentation

## 2017-12-07 DIAGNOSIS — D509 Iron deficiency anemia, unspecified: Secondary | ICD-10-CM | POA: Diagnosis not present

## 2017-12-07 DIAGNOSIS — N189 Chronic kidney disease, unspecified: Secondary | ICD-10-CM | POA: Diagnosis not present

## 2017-12-07 DIAGNOSIS — R300 Dysuria: Secondary | ICD-10-CM

## 2017-12-07 DIAGNOSIS — N3001 Acute cystitis with hematuria: Secondary | ICD-10-CM | POA: Diagnosis not present

## 2017-12-07 LAB — COMPREHENSIVE METABOLIC PANEL
ALT: 12 U/L (ref 0–44)
AST: 15 U/L (ref 15–41)
Albumin: 3.4 g/dL — ABNORMAL LOW (ref 3.5–5.0)
Alkaline Phosphatase: 29 U/L — ABNORMAL LOW (ref 38–126)
Anion gap: 8 (ref 5–15)
BUN: 19 mg/dL (ref 8–23)
CO2: 28 mmol/L (ref 22–32)
Calcium: 9.9 mg/dL (ref 8.9–10.3)
Chloride: 104 mmol/L (ref 98–111)
Creatinine, Ser: 1.11 mg/dL — ABNORMAL HIGH (ref 0.44–1.00)
GFR calc Af Amer: 55 mL/min — ABNORMAL LOW (ref >60)
GFR calc non Af Amer: 48 mL/min — ABNORMAL LOW (ref >60)
Glucose, Bld: 116 mg/dL — ABNORMAL HIGH (ref 70–99)
Potassium: 4.3 mmol/L (ref 3.5–5.1)
Sodium: 140 mmol/L (ref 135–145)
Total Bilirubin: 0.7 mg/dL (ref 0.3–1.2)
Total Protein: 6.8 g/dL (ref 6.5–8.1)

## 2017-12-07 LAB — URINALYSIS, COMPLETE
Bilirubin, UA: NEGATIVE
Glucose, UA: NEGATIVE
Ketones, UA: NEGATIVE
Nitrite, UA: NEGATIVE
Protein, UA: NEGATIVE
Specific Gravity, UA: 1.02 (ref 1.005–1.030)
Urobilinogen, Ur: 0.2 mg/dL (ref 0.2–1.0)
pH, UA: 5 (ref 5.0–7.5)

## 2017-12-07 LAB — CBC WITH DIFFERENTIAL/PLATELET
Abs Immature Granulocytes: 0.03 10*3/uL (ref 0.00–0.07)
Basophils Absolute: 0.1 10*3/uL (ref 0.0–0.1)
Basophils Relative: 1 %
Eosinophils Absolute: 0.2 10*3/uL (ref 0.0–0.5)
Eosinophils Relative: 3 %
HCT: 38.4 % (ref 36.0–46.0)
Hemoglobin: 12.2 g/dL (ref 12.0–15.0)
Immature Granulocytes: 0 %
Lymphocytes Relative: 32 %
Lymphs Abs: 2.7 10*3/uL (ref 0.7–4.0)
MCH: 30 pg (ref 26.0–34.0)
MCHC: 31.8 g/dL (ref 30.0–36.0)
MCV: 94.3 fL (ref 80.0–100.0)
Monocytes Absolute: 0.7 10*3/uL (ref 0.1–1.0)
Monocytes Relative: 8 %
Neutro Abs: 4.8 10*3/uL (ref 1.7–7.7)
Neutrophils Relative %: 56 %
Platelets: 218 10*3/uL (ref 150–400)
RBC: 4.07 MIL/uL (ref 3.87–5.11)
RDW: 13.9 % (ref 11.5–15.5)
WBC: 8.5 10*3/uL (ref 4.0–10.5)
nRBC: 0 % (ref 0.0–0.2)

## 2017-12-07 LAB — MICROSCOPIC EXAMINATION: Renal Epithel, UA: NONE SEEN /hpf

## 2017-12-07 LAB — IRON AND TIBC
Iron: 80 ug/dL (ref 28–170)
Saturation Ratios: 28 % (ref 10.4–31.8)
TIBC: 288 ug/dL (ref 250–450)
UIBC: 208 ug/dL

## 2017-12-07 LAB — VITAMIN B12: Vitamin B-12: 751 pg/mL (ref 180–914)

## 2017-12-07 LAB — FERRITIN: Ferritin: 150 ng/mL (ref 11–307)

## 2017-12-07 MED ORDER — CIPROFLOXACIN HCL 500 MG PO TABS: 500.0000 mg | ORAL_TABLET | Freq: Two times a day (BID) | ORAL | 0 refills | Status: DC

## 2017-12-07 NOTE — Patient Instructions (Signed)

## 2017-12-07 NOTE — Assessment & Plan Note (Signed)
1.  Normocytic anemia: - Etiology: Combination anemia from iron deficiency, CKD and B12 deficiency. -Received weekly Feraheme x2 on 07/05/2017 and 07/12/2017.  Was also started on B12 injections monthly.  Patient reported having a lot of skin rash after receiving last B12 shot on 08/01/2017.  It is unlikely that B12 injection has caused this.  However we will discontinue B12 shots at this time.  She was told to take B12 1 mg tablet daily.  She is also continuing iron tablet. - Denies any bleeding per rectum or melena.  She has dark stools since started on iron tablet.  Had colonoscopy several years ago [facility] when one polyp was removed. -We discussed the results of her blood work which showed hemoglobin of 12.2.  Her ferritin dropped to 150 from 320 in June.  Creatinine is stable at 1.1.  She was told to continue iron tablet and B12 tablet daily. -We will see her back in 3 months for follow-up. - She reportedly had a recent UTI with left line pain, treated with Keflex for 7 days.  She still has some pain.  She was told to follow-up with her PMD.

## 2017-12-07 NOTE — Progress Notes (Signed)
Denise Gardner, [place] [address]   CLINIC:  Medical Oncology/Hematology  PCP:  Sharion Balloon, [place] [address] [phone]   REASON FOR VISIT: Follow-up for anemia and Vitamin B12 deficiency  CURRENT THERAPY: iron infusions and oral B12   INTERVAL HISTORY:  Denise Gardner 72 y.o. female returns for routine follow-up for anemia and B12 deficiency. Patient is doing well. She does complain of tiredness and trouble sleeping at night. She is fatigued during the day. She has constipation, nausea, and vomiting occasionally. She was recently seen at her primary care for left sided flank pain and UTI. She was given antibiotics but is still having pain she is going to follow up today for her persistent pain. She reports her appetite at 75% and she maintains her weight well. She denies any new pains. Denies any easy bleeding or bruising. Denies any headaches or vision changes.     REVIEW OF SYSTEMS:  Review of Systems  Constitutional: Positive for fatigue.  Respiratory: Positive for shortness of breath.   Gastrointestinal: Positive for constipation, nausea and vomiting.  Skin: Positive for itching and rash.  Neurological: Positive for dizziness and extremity weakness.  All other systems reviewed and are negative.    PAST MEDICAL/SURGICAL HISTORY:  Past Medical History:  Diagnosis Date  . Anxiety    depression  . Asthma   . Cataract   . Chronic back pain   . Coronary artery disease    Taxus stent to RCA 2008 2.5 x 16, non obstructive 15% proximal right coronary artery, patent curcumflex LAD, preserved LV  . Diabetes mellitus without complication ([facility])   . GERD (gastroesophageal reflux disease)   . Hyperlipidemia   . Hypertension   . Insomnia   . Migraine   . Neuropathy   . Shingles   . Thyroid disease   . Ulcer, stomach peptic    Past Surgical History:  Procedure Laterality Date  . TOTAL ABDOMINAL HYSTERECTOMY        SOCIAL HISTORY:  Social History   Socioeconomic History  . Marital status: Married    Spouse name: Not on file  . Number of children: Not on file  . Years of education: Not on file  . Highest education level: Not on file  Occupational History  . Occupation: retired    Comment: Higher education careers adviser  . Financial resource strain: Not on file  . Food insecurity:    Worry: Not on file    Inability: Not on file  . Transportation needs:    Medical: Not on file    Non-medical: Not on file  Tobacco Use  . Smoking status: Never Smoker  . Smokeless tobacco: Never Used  Substance and Sexual Activity  . Alcohol use: No  . Drug use: No  . Sexual activity: Not Currently  Lifestyle  . Physical activity:    Days per week: Not on file    Minutes per session: Not on file  . Stress: Not on file  Relationships  . Social connections:    Talks on phone: Not on file    Gets together: Not on file    Attends religious service: Not on file    Active member of club or organization: Not on file    Attends meetings of clubs or organizations: Not on file    Relationship status: Not on file  . Intimate partner violence:    Fear of current or  ex partner: Not on file    Emotionally abused: Not on file    Physically abused: Not on file    Forced sexual activity: Not on file  Other Topics Concern  . Not on file  Social History Narrative   Has a husband but has not lived with him for 11 years.  No children.      FAMILY HISTORY:  Family History  Problem Relation Age of Onset  . Stroke Mother 56  . Heart disease Mother   . Coronary artery disease Brother 50  . Diabetes Brother   . Heart disease Brother   . Coronary artery disease Brother 33  . Diabetes Brother   . Heart disease Brother   . Cancer Sister        breast  . Heart disease Sister   . Diabetes Sister   . Heart disease Sister   . Diabetes Sister   . Heart disease Sister   . Diabetes Sister   . Heart disease  Sister   . Diabetes Brother   . Heart disease Brother   . Heart attack Brother   . Heart disease Brother   . Heart disease Brother   . Heart disease Brother     CURRENT MEDICATIONS:  Outpatient Encounter Medications as of 12/07/2017  Medication Sig  . ACCU-CHEK AVIVA PLUS test strip USE TO CHECK BLOOD GLUCOSE TWICE DAILY AS DIRECTED  . ACCU-CHEK FASTCLIX LANCETS MISC USE TO CHECK BLOOD SUGAR UP TO FOUR TIMES DAILY  . acetaminophen (TYLENOL) 500 MG tablet Take 500 mg by mouth every 6 (six) hours as needed.  Marland Kitchen amLODipine (NORVASC) 10 MG tablet TAKE 1 TABLET(10 MG) BY MOUTH DAILY  . aspirin 81 MG chewable tablet Chew 81 mg by mouth daily.  . blood glucose meter kit and supplies KIT Dispense based on patient & insurance preference. Use to check BG up to BID (Dx: type 2 diabete, no insulin E11.49)  . blood glucose meter kit and supplies Dispense based on patient and insurance preference. Use up to four times daily as directed. (FOR ICD-10 E10.9, E11.9).  . Calcium-Magnesium-Vitamin D (CALCIUM 1200+D3 PO) Take 1 capsule by mouth.  . cephALEXin (KEFLEX) 500 MG capsule Take 1 capsule (500 mg total) by mouth 2 (two) times daily.  . cholecalciferol (VITAMIN D) 400 UNITS TABS Take by mouth. VITAMIN D3 daily   . Cranberry-Vitamin C-Probiotic (AZO CRANBERRY) 250-30 MG TABS Take by mouth.  . diazepam (VALIUM) 5 MG tablet TAKE 1 TABLET BY MOUTH EVERY 12 HOURS AS NEEDED  . diclofenac sodium (VOLTAREN) 1 % GEL Apply 2 g topically 4 (four) times daily.  . diphenhydrAMINE (BENADRYL) 25 mg capsule Take 25 mg by mouth every 6 (six) hours as needed.  . docusate sodium (COLACE) 100 MG capsule Take 100 mg by mouth 2 (two) times daily.  Marland Kitchen escitalopram (LEXAPRO) 10 MG tablet TAKE 1 TABLET BY MOUTH EVERY DAY  . Ferrous Sulfate (IRON) 325 (65 Fe) MG TABS Take 1 tablet by mouth.  . fluticasone (FLONASE) 50 MCG/ACT nasal spray SHAKE LIQUID AND USE 2 SPRAYS IN EACH NOSTRIL DAILY  . gabapentin (NEURONTIN) 300 MG  capsule TAKE 1 CAPSULE BY MOUTH THREE TIMES DAILY  . hydrOXYzine (ATARAX/VISTARIL) 25 MG tablet TK 1 TO 2 TS PO Q 6 H PRF ITCHING.  Marland Kitchen ipratropium-albuterol (DUONEB) 0.5-2.5 (3) MG/3ML SOLN USE 1 VIAL VIA NEBULIZER EVERY 6 HOURS  . isosorbide mononitrate (IMDUR) 60 MG 24 hr tablet TAKE 1 TABLET BY MOUTH EVERY MORNING  .  JANUVIA 100 MG tablet TAKE 1 TABLET BY MOUTH EVERY DAY  . levothyroxine (SYNTHROID) 100 MCG tablet Take 1 tablet (100 mcg total) by mouth daily.  . metFORMIN (GLUCOPHAGE) 1000 MG tablet TAKE 1 TABLET BY MOUTH TWICE DAILY WITH A MEAL  . metoprolol tartrate (LOPRESSOR) 25 MG tablet TAKE 1 TABLET BY MOUTH TWICE DAILY  . misoprostol (CYTOTEC) 200 MCG tablet TAKE 1 TABLET BY MOUTH FOUR TIMES DAILY  . montelukast (SINGULAIR) 10 MG tablet TAKE 1 TABLET BY MOUTH EVERY NIGHT AT BEDTIME  . Multiple Vitamins-Minerals (HAIR SKIN AND NAILS FORMULA PO) Take 1 tablet by mouth daily.  . naproxen (NAPROSYN) 500 MG tablet TAKE 1 TABLET(500 MG) BY MOUTH TWICE DAILY WITH A MEAL  . nitroGLYCERIN (NITROSTAT) 0.4 MG SL tablet DISSOLVE 1 TABLET UNDER THE TONGUE EVERY 5 MINUTES AS NEEDED FOR CHEST PAIN AS DIRECTED  . omeprazole (PRILOSEC) 20 MG capsule TAKE 1 CAPSULE(20 MG) BY MOUTH TWICE DAILY BEFORE A MEAL  . psyllium (REGULOID) 0.52 g capsule Take 0.52 g by mouth daily.  Marland Kitchen QVAR REDIHALER 40 MCG/ACT inhaler INHALE 2 PUFFS BY MOUTH INTO THE LUNGS TWICE DAILY  . raloxifene (EVISTA) 60 MG tablet TAKE 1 TABLET(60 MG) BY MOUTH DAILY  . VASCEPA 1 g CAPS TAKE 4 CAPSULES BY MOUTH EVERY DAY  . vitamin B-12 (CYANOCOBALAMIN) 1000 MCG tablet Take 1,000 mcg by mouth daily.  . [DISCONTINUED] isosorbide mononitrate (IMDUR) 60 MG 24 hr tablet TAKE 1 TABLET BY MOUTH EVERY MORNING  . [DISCONTINUED] nitroGLYCERIN (NITROSTAT) 0.4 MG SL tablet DISSOLVE 1 TABLET UNDER THE TONGUE EVERY 5 MINUTES AS NEEDED FOR CHEST PAIN AS DIRECTED   No facility-administered encounter medications on file as of 12/07/2017.     ALLERGIES:    Allergies  Allergen Reactions  . Milk-Related Compounds Itching and Nausea And Vomiting  . Eggs Or Egg-Derived Products Itching, Nausea And Vomiting and Other (See Comments)    headaches  . Lac Bovis Other (See Comments)    Per PCP office  . Other     Other reaction(s): Other (See Comments) Per [facility] Other reaction(s): Other (See Comments) Per [facility]  . Crestor [Rosuvastatin]     Myalgias   . Ezetimibe Other (See Comments)    mylagia Per PCP office     PHYSICAL EXAM:  ECOG Performance status: 1  Vitals:   12/07/17 1110  BP: 135/63  Pulse: 65  Resp: 18  Temp: 97.6 F (36.4 C)  SpO2: 96%   Filed Weights   12/07/17 1110  Weight: 198 lb (89.8 kg)    Physical Exam  Constitutional: She is oriented to person, place, and time. She appears well-developed and well-nourished.  Cardiovascular: Normal rate, regular rhythm and normal heart sounds.  Pulmonary/Chest: Effort normal and breath sounds normal.  Abdominal:  Left sided flank pain   Musculoskeletal: Normal range of motion.  Neurological: She is alert and oriented to person, place, and time.  Skin: Skin is warm and dry.  Psychiatric: She has a normal mood and affect. Her behavior is normal. Judgment and thought content normal.     LABORATORY DATA:  I have reviewed the labs as listed.  CBC    Component Value Date/Time   WBC 8.5 12/07/2017 1000   RBC 4.07 12/07/2017 1000   HGB 12.2 12/07/2017 1000   HGB 10.4 (L) 05/25/2017 1229   HCT 38.4 12/07/2017 1000   HCT 33.7 (L) 05/25/2017 1229   PLT 218 12/07/2017 1000   PLT 292 05/25/2017  1229   MCV 94.3 12/07/2017 1000   MCV 80 05/25/2017 1229   MCH 30.0 12/07/2017 1000   MCHC 31.8 12/07/2017 1000   RDW 13.9 12/07/2017 1000   RDW 17.2 (H) 05/25/2017 1229   LYMPHSABS 2.7 12/07/2017 1000   LYMPHSABS 3.1 05/25/2017 1229   MONOABS 0.7 12/07/2017 1000   EOSABS 0.2 12/07/2017 1000   EOSABS 0.2 05/25/2017 1229   BASOSABS 0.1 12/07/2017 1000    BASOSABS 0.1 05/25/2017 1229   CMP Latest Ref Rng & Units 12/07/2017 11/24/2017 08/01/2017  Glucose 70 - 99 mg/dL 116(H) 123(H) 150(H)  BUN 8 - 23 mg/dL 19 17 22(H)  Creatinine 0.44 - 1.00 mg/dL 1.11(H) 1.06(H) 1.07(H)  Sodium 135 - 145 mmol/L 140 143 139  Potassium 3.5 - 5.1 mmol/L 4.3 4.2 4.1  Chloride 98 - 111 mmol/L 104 102 103  CO2 22 - 32 mmol/L _0 Calcium 8.9 - 10.3 mg/dL 9.9 9.6 9.4  Total Protein 6.5 - 8.1 g/dL 6.8 6.2 6.5  Total Bilirubin 0.3 - 1.2 mg/dL 0.7 0.3 0.5  Alkaline Phos 38 - 126 U/L 29(L) 35(L) 28(L)  AST 15 - 41 U/L _1 ALT 0 - 44 U/L _2 ASSESSMENT & PLAN:   Iron deficiency anemia 1.  Normocytic anemia: - Etiology: Combination anemia from iron deficiency, CKD and B12 deficiency. -Received weekly Feraheme x2 on 07/05/2017 and 07/12/2017.  Was also started on B12 injections monthly.  Patient reported having a lot of skin rash after receiving last B12 shot on 08/01/2017.  It is unlikely that B12 injection has caused this.  However we will discontinue B12 shots at this time.  She was told to take B12 1 mg tablet daily.  She is also continuing iron tablet. - Denies any bleeding per rectum or melena.  She has dark stools since started on iron tablet.  Had colonoscopy several years ago at [facility] when one polyp was removed. -We discussed the results of her blood work which showed hemoglobin of 12.2.  Her ferritin dropped to 150 from 320 in June.  Creatinine is stable at 1.1.  She was told to continue iron tablet and B12 tablet daily. -We will see her back in 3 months for follow-up. - She reportedly had a recent UTI with left line pain, treated with Keflex for 7 days.  She still has some pain.  She was told to follow-up with her PMD.       Orders placed this encounter:  Orders Placed This Encounter  Procedures  . CBC with Differential/Platelet  . Comprehensive metabolic panel  . Ferritin  . Iron and TIBC  . Vitamin B12   . Folate      Derek Jack, MD [place] [phone]

## 2017-12-07 NOTE — Patient Instructions (Signed)
[facility] at [facility] Discharge Instructions  Follow up in 3 months with labs    Thank you for choosing [facility] at [facility] to provide your oncology and hematology care.  To afford each patient quality time with our provider, please arrive at least 15 minutes before your scheduled appointment time.   If you have a lab appointment with the Cancer Center please come in thru the  Main Entrance and check in at the main information desk  You need to re-schedule your appointment should you arrive 10 or more minutes late.  We strive to give you quality time with our providers, and arriving late affects you and other patients whose appointments are after yours.  Also, if you no show three or more times for appointments you may be dismissed from the clinic at the providers discretion.     Again, thank you for choosing [facility].  Our hope is that these requests will decrease the amount of time that you wait before being seen by our physicians.       _____________________________________________________________  Should you have questions after your visit to [facility], please contact our office at [phone] between the hours of 8:00 a.m. and 4:30 p.m.  Voicemails left after 4:00 p.m. will not be returned until the following business day.  For prescription refill requests, have your pharmacy contact our office and allow 72 hours.    Cancer Center Support Programs:   > Cancer Support Group  2nd Tuesday of the month 1pm-2pm, Journey Room    

## 2017-12-07 NOTE — Progress Notes (Signed)
   Subjective:    Patient ID: Denise Gardner, female    DOB: November 11, 1945, 72 y.o.   MRN: [id number]  Chief Complaint  Patient presents with  . Dysuria    Dysuria   This is a recurrent problem. The current episode started 1 to 4 weeks ago. The problem occurs intermittently. The problem has been waxing and waning. The quality of the pain is described as burning. The pain is at a severity of 5/10. The pain is moderate. Associated symptoms include flank pain, frequency, hematuria, nausea and urgency. Pertinent negatives include no vomiting. She has tried antibiotics for the symptoms. The treatment provided no relief.      Review of Systems  Gastrointestinal: Positive for nausea. Negative for vomiting.  Genitourinary: Positive for dysuria, flank pain, frequency, hematuria and urgency.  All other systems reviewed and are negative.      Objective:   Physical Exam  Constitutional: She is oriented to person, place, and time. She appears well-developed and well-nourished. No distress.  HENT:  Head: Normocephalic.  Cardiovascular: Normal rate, regular rhythm, normal heart sounds and intact distal pulses.  No murmur heard. Pulmonary/Chest: Effort normal and breath sounds normal. No respiratory distress. She has no wheezes.  Abdominal: Soft. Bowel sounds are normal. She exhibits no distension. There is tenderness (mild lower left pain).  Neurological: She is alert and oriented to person, place, and time. She has normal reflexes. No cranial nerve deficit.  Skin: Skin is warm and dry.  Psychiatric: She has a normal mood and affect. Her behavior is normal. Judgment and thought content normal.  Vitals reviewed.     BP 126/66   Pulse 69   Temp (!) 97.4 F (36.3 C) (Oral)   Ht 5' 4.5" (1.638 m)   Wt 199 lb 6.4 oz (90.4 kg)   BMI 33.70 kg/m      Assessment & Plan:  Denise Gardner comes in today with chief complaint of Dysuria   Diagnosis and orders addressed:  1. Dysuria - Urinalysis,  Complete  2. Acute cystitis with hematuria Force fluids AZO over the counter X2 days RTO prn Culture pending - ciprofloxacin (CIPRO) 500 MG tablet; Take 1 tablet (500 mg total) by mouth 2 (two) times daily.  Dispense: 14 tablet; Refill: 0 - Urine Culture   Evelina Dun, FNP

## 2017-12-08 LAB — URINE CULTURE

## 2017-12-16 ENCOUNTER — Other Ambulatory Visit: Payer: Self-pay | Admitting: Family

## 2017-12-16 DIAGNOSIS — K5900 Constipation, unspecified: Secondary | ICD-10-CM

## 2017-12-16 DIAGNOSIS — R69 Illness, unspecified: Secondary | ICD-10-CM | POA: Diagnosis not present

## 2017-12-16 DIAGNOSIS — F32A Depression, unspecified: Secondary | ICD-10-CM

## 2017-12-16 DIAGNOSIS — J309 Allergic rhinitis, unspecified: Secondary | ICD-10-CM

## 2017-12-16 DIAGNOSIS — F419 Anxiety disorder, unspecified: Secondary | ICD-10-CM

## 2017-12-16 DIAGNOSIS — E1149 Type 2 diabetes mellitus with other diabetic neurological complication: Secondary | ICD-10-CM

## 2017-12-16 DIAGNOSIS — J069 Acute upper respiratory infection, unspecified: Secondary | ICD-10-CM

## 2017-12-16 DIAGNOSIS — F329 Major depressive disorder, single episode, unspecified: Secondary | ICD-10-CM

## 2017-12-16 DIAGNOSIS — M8580 Other specified disorders of bone density and structure, unspecified site: Secondary | ICD-10-CM

## 2017-12-18 DIAGNOSIS — R69 Illness, unspecified: Secondary | ICD-10-CM | POA: Diagnosis not present

## 2018-01-02 ENCOUNTER — Ambulatory Visit (INDEPENDENT_AMBULATORY_CARE_PROVIDER_SITE_OTHER): Payer: Medicare HMO

## 2018-01-02 DIAGNOSIS — Z23 Encounter for immunization: Secondary | ICD-10-CM | POA: Diagnosis not present

## 2018-01-10 ENCOUNTER — Other Ambulatory Visit: Payer: Self-pay | Admitting: Family

## 2018-01-10 DIAGNOSIS — E039 Hypothyroidism, unspecified: Secondary | ICD-10-CM | POA: Diagnosis not present

## 2018-01-10 DIAGNOSIS — J449 Chronic obstructive pulmonary disease, unspecified: Secondary | ICD-10-CM | POA: Diagnosis not present

## 2018-01-10 DIAGNOSIS — E1142 Type 2 diabetes mellitus with diabetic polyneuropathy: Secondary | ICD-10-CM | POA: Diagnosis not present

## 2018-01-10 DIAGNOSIS — I10 Essential (primary) hypertension: Secondary | ICD-10-CM | POA: Diagnosis not present

## 2018-01-10 DIAGNOSIS — G8929 Other chronic pain: Secondary | ICD-10-CM | POA: Diagnosis not present

## 2018-01-10 DIAGNOSIS — E669 Obesity, unspecified: Secondary | ICD-10-CM | POA: Diagnosis not present

## 2018-01-10 DIAGNOSIS — R69 Illness, unspecified: Secondary | ICD-10-CM | POA: Diagnosis not present

## 2018-01-10 DIAGNOSIS — I25119 Atherosclerotic heart disease of native coronary artery with unspecified angina pectoris: Secondary | ICD-10-CM | POA: Diagnosis not present

## 2018-01-10 DIAGNOSIS — E785 Hyperlipidemia, unspecified: Secondary | ICD-10-CM | POA: Diagnosis not present

## 2018-02-08 ENCOUNTER — Other Ambulatory Visit: Payer: Self-pay | Admitting: Family

## 2018-02-12 LAB — HM DIABETES EYE EXAM

## 2018-02-13 DIAGNOSIS — Z1231 Encounter for screening mammogram for malignant neoplasm of breast: Secondary | ICD-10-CM | POA: Diagnosis not present

## 2018-02-13 LAB — HM MAMMOGRAPHY

## 2018-02-14 ENCOUNTER — Other Ambulatory Visit: Payer: Self-pay | Admitting: Family

## 2018-02-14 DIAGNOSIS — K2100 Gastro-esophageal reflux disease with esophagitis, without bleeding: Secondary | ICD-10-CM

## 2018-02-14 DIAGNOSIS — F419 Anxiety disorder, unspecified: Secondary | ICD-10-CM

## 2018-02-14 DIAGNOSIS — M8580 Other specified disorders of bone density and structure, unspecified site: Secondary | ICD-10-CM

## 2018-02-15 NOTE — Telephone Encounter (Signed)
Last seen 12/07/17

## 2018-02-27 DIAGNOSIS — R69 Illness, unspecified: Secondary | ICD-10-CM | POA: Diagnosis not present

## 2018-03-07 ENCOUNTER — Other Ambulatory Visit: Payer: Self-pay | Admitting: Family

## 2018-03-07 DIAGNOSIS — K5900 Constipation, unspecified: Secondary | ICD-10-CM

## 2018-03-12 ENCOUNTER — Inpatient Hospital Stay (HOSPITAL_COMMUNITY): Payer: Medicare HMO | Attending: Hematology

## 2018-03-12 DIAGNOSIS — D631 Anemia in chronic kidney disease: Secondary | ICD-10-CM | POA: Diagnosis not present

## 2018-03-12 DIAGNOSIS — I10 Essential (primary) hypertension: Secondary | ICD-10-CM | POA: Insufficient documentation

## 2018-03-12 DIAGNOSIS — N189 Chronic kidney disease, unspecified: Secondary | ICD-10-CM | POA: Insufficient documentation

## 2018-03-12 DIAGNOSIS — D509 Iron deficiency anemia, unspecified: Secondary | ICD-10-CM | POA: Diagnosis not present

## 2018-03-12 DIAGNOSIS — E538 Deficiency of other specified B group vitamins: Secondary | ICD-10-CM | POA: Diagnosis not present

## 2018-03-12 DIAGNOSIS — Z79899 Other long term (current) drug therapy: Secondary | ICD-10-CM | POA: Diagnosis not present

## 2018-03-12 DIAGNOSIS — Z7982 Long term (current) use of aspirin: Secondary | ICD-10-CM | POA: Insufficient documentation

## 2018-03-12 DIAGNOSIS — Z7984 Long term (current) use of oral hypoglycemic drugs: Secondary | ICD-10-CM | POA: Insufficient documentation

## 2018-03-12 LAB — COMPREHENSIVE METABOLIC PANEL
ALT: 13 U/L (ref 0–44)
AST: 16 U/L (ref 15–41)
Albumin: 3.6 g/dL (ref 3.5–5.0)
Alkaline Phosphatase: 29 U/L — ABNORMAL LOW (ref 38–126)
Anion gap: 7 (ref 5–15)
BUN: 22 mg/dL (ref 8–23)
CO2: 28 mmol/L (ref 22–32)
Calcium: 9.8 mg/dL (ref 8.9–10.3)
Chloride: 104 mmol/L (ref 98–111)
Creatinine, Ser: 1.25 mg/dL — ABNORMAL HIGH (ref 0.44–1.00)
GFR calc Af Amer: 49 mL/min — ABNORMAL LOW (ref >60)
GFR calc non Af Amer: 42 mL/min — ABNORMAL LOW (ref >60)
Glucose, Bld: 119 mg/dL — ABNORMAL HIGH (ref 70–99)
Potassium: 4.2 mmol/L (ref 3.5–5.1)
Sodium: 139 mmol/L (ref 135–145)
Total Bilirubin: 0.7 mg/dL (ref 0.3–1.2)
Total Protein: 6.6 g/dL (ref 6.5–8.1)

## 2018-03-12 LAB — CBC WITH DIFFERENTIAL/PLATELET
Abs Immature Granulocytes: 0.02 10*3/uL (ref 0.00–0.07)
Basophils Absolute: 0.1 10*3/uL (ref 0.0–0.1)
Basophils Relative: 1 %
Eosinophils Absolute: 0.2 10*3/uL (ref 0.0–0.5)
Eosinophils Relative: 3 %
HCT: 40.9 % (ref 36.0–46.0)
Hemoglobin: 13 g/dL (ref 12.0–15.0)
Immature Granulocytes: 0 %
Lymphocytes Relative: 37 %
Lymphs Abs: 2.7 10*3/uL (ref 0.7–4.0)
MCH: 29.7 pg (ref 26.0–34.0)
MCHC: 31.8 g/dL (ref 30.0–36.0)
MCV: 93.4 fL (ref 80.0–100.0)
Monocytes Absolute: 0.6 10*3/uL (ref 0.1–1.0)
Monocytes Relative: 8 %
Neutro Abs: 3.8 10*3/uL (ref 1.7–7.7)
Neutrophils Relative %: 51 %
Platelets: 181 10*3/uL (ref 150–400)
RBC: 4.38 MIL/uL (ref 3.87–5.11)
RDW: 12.8 % (ref 11.5–15.5)
WBC: 7.4 10*3/uL (ref 4.0–10.5)
nRBC: 0 % (ref 0.0–0.2)

## 2018-03-12 LAB — IRON AND TIBC
Iron: 70 ug/dL (ref 28–170)
Saturation Ratios: 25 % (ref 10.4–31.8)
TIBC: 277 ug/dL (ref 250–450)
UIBC: 207 ug/dL

## 2018-03-12 LAB — VITAMIN B12: Vitamin B-12: 1006 pg/mL — ABNORMAL HIGH (ref 180–914)

## 2018-03-12 LAB — FERRITIN: Ferritin: 148 ng/mL (ref 11–307)

## 2018-03-12 LAB — FOLATE: Folate: 19.4 ng/mL (ref >5.9)

## 2018-03-16 ENCOUNTER — Other Ambulatory Visit: Payer: Self-pay | Admitting: Family

## 2018-03-16 DIAGNOSIS — R69 Illness, unspecified: Secondary | ICD-10-CM | POA: Diagnosis not present

## 2018-03-16 DIAGNOSIS — F419 Anxiety disorder, unspecified: Secondary | ICD-10-CM

## 2018-03-16 DIAGNOSIS — K2100 Gastro-esophageal reflux disease with esophagitis, without bleeding: Secondary | ICD-10-CM

## 2018-03-16 DIAGNOSIS — E1149 Type 2 diabetes mellitus with other diabetic neurological complication: Secondary | ICD-10-CM

## 2018-03-16 DIAGNOSIS — F32A Depression, unspecified: Secondary | ICD-10-CM

## 2018-03-16 DIAGNOSIS — J4520 Mild intermittent asthma, uncomplicated: Secondary | ICD-10-CM

## 2018-03-16 DIAGNOSIS — J309 Allergic rhinitis, unspecified: Secondary | ICD-10-CM

## 2018-03-16 DIAGNOSIS — J069 Acute upper respiratory infection, unspecified: Secondary | ICD-10-CM

## 2018-03-16 DIAGNOSIS — F329 Major depressive disorder, single episode, unspecified: Secondary | ICD-10-CM

## 2018-03-19 ENCOUNTER — Encounter (HOSPITAL_COMMUNITY): Payer: Self-pay | Admitting: Hematology

## 2018-03-19 ENCOUNTER — Other Ambulatory Visit: Payer: Self-pay

## 2018-03-19 ENCOUNTER — Inpatient Hospital Stay (HOSPITAL_COMMUNITY): Payer: Medicare HMO | Admitting: Hematology

## 2018-03-19 VITALS — BP 122/60 | HR 68 | Temp 97.6°F | Resp 16 | Wt 193.5 lb

## 2018-03-19 DIAGNOSIS — N189 Chronic kidney disease, unspecified: Secondary | ICD-10-CM | POA: Diagnosis not present

## 2018-03-19 DIAGNOSIS — D631 Anemia in chronic kidney disease: Secondary | ICD-10-CM

## 2018-03-19 DIAGNOSIS — D509 Iron deficiency anemia, unspecified: Secondary | ICD-10-CM

## 2018-03-19 DIAGNOSIS — I10 Essential (primary) hypertension: Secondary | ICD-10-CM

## 2018-03-19 DIAGNOSIS — E538 Deficiency of other specified B group vitamins: Secondary | ICD-10-CM

## 2018-03-19 DIAGNOSIS — Z7982 Long term (current) use of aspirin: Secondary | ICD-10-CM

## 2018-03-19 DIAGNOSIS — Z79899 Other long term (current) drug therapy: Secondary | ICD-10-CM

## 2018-03-19 DIAGNOSIS — Z7984 Long term (current) use of oral hypoglycemic drugs: Secondary | ICD-10-CM

## 2018-03-19 NOTE — Progress Notes (Signed)
Denise Gardner, Denise Gardner [id number]   CLINIC:  Medical Oncology/Hematology  PCP:  Sharion Balloon, [place] [address] [phone]   REASON FOR VISIT: Follow-up for anemia and Vitamin B12 deficiency  CURRENT THERAPY: Oral iron and oral B12  INTERVAL HISTORY:  Denise Gardner 73 y.o. female returns for routine follow-up for anemia and B 12 deficiency. She is here today with her husband. She complains of generalized pains and fatigued. She has problems with constipation due to the oral iron supplements. She has a sore place on her ear that has been there for a while. She will follow up with a Dermatologist for this. Denies any nausea, vomiting, or diarrhea. Denies any new pains. Had not noticed any recent bleeding such as epistaxis, hematuria or hematochezia. Denies recent chest pain on exertion, shortness of breath on minimal exertion, pre-syncopal episodes, or palpitations. Denies any numbness or tingling in hands or feet. Denies any recent fevers, infections, or recent hospitalizations. Patient reports appetite at 100% and energy level at 25%. She has no problems maintaining her weight.     REVIEW OF SYSTEMS:  Review of Systems  Constitutional: Positive for fatigue.  Gastrointestinal: Positive for constipation.  All other systems reviewed and are negative.    PAST MEDICAL/SURGICAL HISTORY:  Past Medical History:  Diagnosis Date  . Anxiety    depression  . Asthma   . Cataract   . Chronic back pain   . Coronary artery disease    Taxus stent to RCA 2008 2.5 x 16, non obstructive 15% proximal right coronary artery, patent curcumflex LAD, preserved LV  . Diabetes mellitus without complication ([place])   . GERD (gastroesophageal reflux disease)   . Hyperlipidemia   . Hypertension   . Insomnia   . Migraine   . Neuropathy   . Shingles   . Thyroid disease   . Ulcer, stomach peptic    Past Surgical History:  Procedure  Laterality Date  . TOTAL ABDOMINAL HYSTERECTOMY       SOCIAL HISTORY:  Social History   Socioeconomic History  . Marital status: Married    Spouse name: Not on file  . Number of children: Not on file  . Years of education: Not on file  . Highest education level: Not on file  Occupational History  . Occupation: retired    Comment: Higher education careers adviser  . Financial resource strain: Not on file  . Food insecurity:    Worry: Not on file    Inability: Not on file  . Transportation needs:    Medical: Not on file    Non-medical: Not on file  Tobacco Use  . Smoking status: Never Smoker  . Smokeless tobacco: Never Used  Substance and Sexual Activity  . Alcohol use: No  . Drug use: No  . Sexual activity: Not Currently  Lifestyle  . Physical activity:    Days per week: Not on file    Minutes per session: Not on file  . Stress: Not on file  Relationships  . Social connections:    Talks on phone: Not on file    Gets together: Not on file    Attends religious service: Not on file    Active member of club or organization: Not on file    Attends meetings of clubs or organizations: Not on file    Relationship status: Not on file  . Intimate partner violence:  Fear of current or ex partner: Not on file    Emotionally abused: Not on file    Physically abused: Not on file    Forced sexual activity: Not on file  Other Topics Concern  . Not on file  Social History Narrative   Has a husband but has not lived with him for 38 years.  No children.      FAMILY HISTORY:  Family History  Problem Relation Age of Onset  . Stroke Mother 61  . Heart disease Mother   . Coronary artery disease Brother 13  . Diabetes Brother   . Heart disease Brother   . Coronary artery disease Brother 60  . Diabetes Brother   . Heart disease Brother   . Cancer Sister        breast  . Heart disease Sister   . Diabetes Sister   . Heart disease Sister   . Diabetes Sister   . Heart  disease Sister   . Diabetes Sister   . Heart disease Sister   . Diabetes Brother   . Heart disease Brother   . Heart attack Brother   . Heart disease Brother   . Heart disease Brother   . Heart disease Brother     CURRENT MEDICATIONS:  Outpatient Encounter Medications as of 03/19/2018  Medication Sig  . ACCU-CHEK AVIVA PLUS test strip USE TO CHECK BLOOD GLUCOSE TWICE DAILY AS DIRECTED  . ACCU-CHEK FASTCLIX LANCETS MISC USE TO CHECK BLOOD SUGAR UP TO FOUR TIMES DAILY  . acetaminophen (TYLENOL) 500 MG tablet Take 500 mg by mouth every 6 (six) hours as needed.  Marland Kitchen amLODipine (NORVASC) 10 MG tablet TAKE 1 TABLET(10 MG) BY MOUTH DAILY  . aspirin 81 MG chewable tablet Chew 81 mg by mouth daily.  . blood glucose meter kit and supplies KIT Dispense based on patient & insurance preference. Use to check BG up to BID (Dx: type 2 diabete, no insulin E11.49)  . blood glucose meter kit and supplies Dispense based on patient and insurance preference. Use up to four times daily as directed. (FOR ICD-10 E10.9, E11.9).  . Calcium-Magnesium-Vitamin D (CALCIUM 1200+D3 PO) Take 1 capsule by mouth.  . cholecalciferol (VITAMIN D) 400 UNITS TABS Take by mouth. VITAMIN D3 daily   . diazepam (VALIUM) 5 MG tablet TAKE 1 TABLET BY MOUTH EVERY 12 HOURS AS NEEDED  . escitalopram (LEXAPRO) 10 MG tablet TAKE 1 TABLET BY MOUTH EVERY DAY  . Ferrous Sulfate (IRON) 325 (65 Fe) MG TABS Take 1 tablet by mouth.  . fluticasone (FLONASE) 50 MCG/ACT nasal spray SHAKE LIQUID AND USE 2 SPRAYS IN EACH NOSTRIL DAILY  . gabapentin (NEURONTIN) 300 MG capsule TAKE 1 CAPSULE BY MOUTH THREE TIMES DAILY  . ipratropium-albuterol (DUONEB) 0.5-2.5 (3) MG/3ML SOLN USE 1 VIAL VIA NEBULIZER EVERY 6 HOURS  . isosorbide mononitrate (IMDUR) 60 MG 24 hr tablet TAKE 1 TABLET BY MOUTH EVERY MORNING  . JANUVIA 100 MG tablet TAKE 1 TABLET BY MOUTH EVERY DAY  . levothyroxine (SYNTHROID) 100 MCG tablet Take 1 tablet (100 mcg total) by mouth daily.  .  metFORMIN (GLUCOPHAGE) 1000 MG tablet TAKE 1 TABLET BY MOUTH TWICE DAILY WITH A MEAL  . metoprolol tartrate (LOPRESSOR) 25 MG tablet TAKE 1 TABLET BY MOUTH TWICE DAILY  . misoprostol (CYTOTEC) 200 MCG tablet TAKE 1 TABLET BY MOUTH FOUR TIMES DAILY  . montelukast (SINGULAIR) 10 MG tablet TAKE 1 TABLET BY MOUTH EVERY NIGHT AT BEDTIME  . Multiple Vitamins-Minerals (HAIR  SKIN AND NAILS FORMULA PO) Take 1 tablet by mouth daily.  . naproxen (NAPROSYN) 500 MG tablet TAKE 1 TABLET(500 MG) BY MOUTH TWICE DAILY WITH A MEAL  . omeprazole (PRILOSEC) 20 MG capsule TAKE 1 CAPSULE(20 MG) BY MOUTH TWICE DAILY BEFORE A MEAL  . QVAR REDIHALER 40 MCG/ACT inhaler INHALE 2 PUFFS BY MOUTH INTO THE LUNGS TWICE DAILY  . VASCEPA 1 g CAPS TAKE 4 CAPSULES BY MOUTH EVERY DAY  . vitamin B-12 (CYANOCOBALAMIN) 1000 MCG tablet Take 1,000 mcg by mouth daily.  . Cranberry-Vitamin C-Probiotic (AZO CRANBERRY) 250-30 MG TABS Take by mouth.  . diclofenac sodium (VOLTAREN) 1 % GEL Apply 2 g topically 4 (four) times daily. (Patient not taking: Reported on 03/19/2018)  . diphenhydrAMINE (BENADRYL) 25 mg capsule Take 25 mg by mouth every 6 (six) hours as needed.  . docusate sodium (COLACE) 100 MG capsule Take 100 mg by mouth 2 (two) times daily.  . nitroGLYCERIN (NITROSTAT) 0.4 MG SL tablet DISSOLVE 1 TABLET UNDER THE TONGUE EVERY 5 MINUTES AS NEEDED FOR CHEST PAIN AS DIRECTED (Patient not taking: Reported on 03/19/2018)  . psyllium (REGULOID) 0.52 g capsule Take 0.52 g by mouth daily.  . raloxifene (EVISTA) 60 MG tablet TAKE 1 TABLET(60 MG) BY MOUTH DAILY  . [DISCONTINUED] cephALEXin (KEFLEX) 500 MG capsule Take 1 capsule (500 mg total) by mouth 2 (two) times daily.  . [DISCONTINUED] ciprofloxacin (CIPRO) 500 MG tablet Take 1 tablet (500 mg total) by mouth 2 (two) times daily.  . [DISCONTINUED] hydrOXYzine (ATARAX/VISTARIL) 25 MG tablet TK 1 TO 2 TS PO Q 6 H PRF ITCHING.   No facility-administered encounter medications on file as of  03/19/2018.     ALLERGIES:  Allergies  Allergen Reactions  . Milk-Related Compounds Itching and Nausea And Vomiting  . Eggs Or Egg-Derived Products Itching, Nausea And Vomiting and Other (See Comments)    headaches  . Lac Bovis Other (See Comments)    Per PCP office  . Other     Other reaction(s): Other (See Comments) Per [facility] Other reaction(s): Other (See Comments) Per [facility]  . Crestor [Rosuvastatin]     Myalgias   . Ezetimibe Other (See Comments)    mylagia Per PCP office     PHYSICAL EXAM:  ECOG Performance status: 1  Vitals:   03/19/18 1100  BP: 122/60  Pulse: 68  Resp: 16  Temp: 97.6 F (36.4 C)  SpO2: 99%   Filed Weights   03/19/18 1100  Weight: 193 lb 8 oz (87.8 kg)    Physical Exam Constitutional:      Appearance: Normal appearance. She is normal weight.  Cardiovascular:     Rate and Rhythm: Normal rate and regular rhythm.     Heart sounds: Normal heart sounds.  Pulmonary:     Effort: Pulmonary effort is normal.     Breath sounds: Normal breath sounds.  Musculoskeletal: Normal range of motion.  Skin:    General: Skin is warm and dry.  Neurological:     Mental Status: She is alert and oriented to person, place, and time. Mental status is at baseline.  Psychiatric:        Mood and Affect: Mood normal.        Behavior: Behavior normal.        Thought Content: Thought content normal.        Judgment: Judgment normal.      LABORATORY DATA:  I have reviewed the labs as listed.  CBC    Component Value Date/Time   WBC 7.4 03/12/2018 1208   RBC 4.38 03/12/2018 1208   HGB 13.0 03/12/2018 1208   HGB 10.4 (L) 05/25/2017 1229   HCT 40.9 03/12/2018 1208   HCT 33.7 (L) 05/25/2017 1229   PLT 181 03/12/2018 1208   PLT 292 05/25/2017 1229   MCV 93.4 03/12/2018 1208   MCV 80 05/25/2017 1229   MCH 29.7 03/12/2018 1208   MCHC 31.8 03/12/2018 1208   RDW 12.8 03/12/2018 1208   RDW 17.2 (H) 05/25/2017 1229   LYMPHSABS 2.7  03/12/2018 1208   LYMPHSABS 3.1 05/25/2017 1229   MONOABS 0.6 03/12/2018 1208   EOSABS 0.2 03/12/2018 1208   EOSABS 0.2 05/25/2017 1229   BASOSABS 0.1 03/12/2018 1208   BASOSABS 0.1 05/25/2017 1229   CMP Latest Ref Rng & Units 03/12/2018 12/07/2017 11/24/2017  Glucose 70 - 99 mg/dL 119(H) 116(H) 123(H)  BUN 8 - 23 mg/dL _0 Creatinine 0.44 - 1.00 mg/dL 1.25(H) 1.11(H) 1.06(H)  Sodium 135 - 145 mmol/L 139 140 143  Potassium 3.5 - 5.1 mmol/L 4.2 4.3 4.2  Chloride 98 - 111 mmol/L 104 104 102  CO2 22 - 32 mmol/L _1 Calcium 8.9 - 10.3 mg/dL 9.8 9.9 9.6  Total Protein 6.5 - 8.1 g/dL 6.6 6.8 6.2  Total Bilirubin 0.3 - 1.2 mg/dL 0.7 0.7 0.3  Alkaline Phos 38 - 126 U/L 29(L) 29(L) 35(L)  AST 15 - 41 U/L _2 ALT 0 - 44 U/L _3 DIAGNOSTIC IMAGING:  I have independently reviewed the scans and discussed with the patient.   I have reviewed Francene Finders, NP's note and agree with the documentation.  I personally performed a face-to-face visit, made revisions and my assessment and plan is as follows.    ASSESSMENT & PLAN:   Iron deficiency anemia 1.  Normocytic anemia: - Etiology: Combination anemia from iron deficiency, CKD and B12 deficiency. -Last weekly Feraheme x2 on 07/05/2017 and 07/12/2017. -Patient currently taking B12 tablet daily. -Denies any bleeding per rectum or melena. - We reviewed her blood work.  Ferritin was 148 with percent saturation of 25.  B12 was normal.  Hemoglobin was 13. - She will continue 1 tablet of iron daily.  She gets constipated.  She was told to take stool softener.  She will also continue B12 tablet daily. -We will see her back in 4 months with repeat blood work.      Orders placed this encounter:  Orders Placed This Encounter  Procedures  . CBC with Differential/Platelet  . Comprehensive metabolic panel  . Ferritin  . Iron and TIBC  . Vitamin B12  . Folate      Derek Jack, MD [place] [phone]

## 2018-03-19 NOTE — Patient Instructions (Addendum)
[facility] at [facility] Discharge Instructions Follow up in 4 months with labs    Thank you for choosing [facility] at [facility] to provide your oncology and hematology care.  To afford each patient quality time with our provider, please arrive at least 15 minutes before your scheduled appointment time.   If you have a lab appointment with the Cancer Center please come in thru the  Main Entrance and check in at the main information desk  You need to re-schedule your appointment should you arrive 10 or more minutes late.  We strive to give you quality time with our providers, and arriving late affects you and other patients whose appointments are after yours.  Also, if you no show three or more times for appointments you may be dismissed from the clinic at the providers discretion.     Again, thank you for choosing [facility].  Our hope is that these requests will decrease the amount of time that you wait before being seen by our physicians.       _____________________________________________________________  Should you have questions after your visit to [facility], please contact our office at [phone] between the hours of 8:00 a.m. and 4:30 p.m.  Voicemails left after 4:00 p.m. will not be returned until the following business day.  For prescription refill requests, have your pharmacy contact our office and allow 72 hours.    Cancer Center Support Programs:   > Cancer Support Group  2nd Tuesday of the month 1pm-2pm, Journey Room    

## 2018-03-19 NOTE — Assessment & Plan Note (Signed)
1.  Normocytic anemia: - Etiology: Combination anemia from iron deficiency, CKD and B12 deficiency. -Last weekly Feraheme x2 on 07/05/2017 and 07/12/2017. -Patient currently taking B12 tablet daily. -Denies any bleeding per rectum or melena. - We reviewed her blood work.  Ferritin was 148 with percent saturation of 25.  B12 was normal.  Hemoglobin was 13. - She will continue 1 tablet of iron daily.  She gets constipated.  She was told to take stool softener.  She will also continue B12 tablet daily. -We will see her back in 4 months with repeat blood work.

## 2018-03-27 ENCOUNTER — Encounter: Payer: Self-pay | Admitting: Family

## 2018-03-27 ENCOUNTER — Ambulatory Visit (INDEPENDENT_AMBULATORY_CARE_PROVIDER_SITE_OTHER): Payer: Medicare HMO | Admitting: Family

## 2018-03-27 VITALS — BP 138/86 | HR 74 | Temp 96.8°F | Ht 64.5 in | Wt 194.8 lb

## 2018-03-27 DIAGNOSIS — E785 Hyperlipidemia, unspecified: Secondary | ICD-10-CM

## 2018-03-27 DIAGNOSIS — E1169 Type 2 diabetes mellitus with other specified complication: Secondary | ICD-10-CM

## 2018-03-27 DIAGNOSIS — I2510 Atherosclerotic heart disease of native coronary artery without angina pectoris: Secondary | ICD-10-CM | POA: Diagnosis not present

## 2018-03-27 DIAGNOSIS — R69 Illness, unspecified: Secondary | ICD-10-CM | POA: Diagnosis not present

## 2018-03-27 DIAGNOSIS — E669 Obesity, unspecified: Secondary | ICD-10-CM

## 2018-03-27 DIAGNOSIS — I10 Essential (primary) hypertension: Secondary | ICD-10-CM

## 2018-03-27 DIAGNOSIS — E039 Hypothyroidism, unspecified: Secondary | ICD-10-CM | POA: Diagnosis not present

## 2018-03-27 DIAGNOSIS — D509 Iron deficiency anemia, unspecified: Secondary | ICD-10-CM

## 2018-03-27 DIAGNOSIS — I152 Hypertension secondary to endocrine disorders: Secondary | ICD-10-CM

## 2018-03-27 DIAGNOSIS — J45909 Unspecified asthma, uncomplicated: Secondary | ICD-10-CM

## 2018-03-27 DIAGNOSIS — E1142 Type 2 diabetes mellitus with diabetic polyneuropathy: Secondary | ICD-10-CM | POA: Diagnosis not present

## 2018-03-27 DIAGNOSIS — E1159 Type 2 diabetes mellitus with other circulatory complications: Secondary | ICD-10-CM

## 2018-03-27 DIAGNOSIS — F419 Anxiety disorder, unspecified: Secondary | ICD-10-CM

## 2018-03-27 DIAGNOSIS — E538 Deficiency of other specified B group vitamins: Secondary | ICD-10-CM

## 2018-03-27 DIAGNOSIS — F331 Major depressive disorder, recurrent, moderate: Secondary | ICD-10-CM

## 2018-03-27 LAB — BAYER DCA HB A1C WAIVED: HB A1C (BAYER DCA - WAIVED): 6.1 % (ref <7.0)

## 2018-03-27 MED ORDER — METFORMIN HCL ER 500 MG PO TB24: 1000.0000 mg | ORAL_TABLET | Freq: Every day | ORAL | 1 refills | Status: DC

## 2018-03-27 NOTE — Patient Instructions (Signed)

## 2018-03-27 NOTE — Progress Notes (Signed)
Subjective:    Patient ID: Denise Gardner, female    DOB: October 25, 1945, 73 y.o.   MRN: [id number]  Chief Complaint  Patient presents with  . Diabetes    four month recheck   Pt presents to the office today for chronic follow up. She is followed by  Cardiologists annually for CAD. She is followed by Hematologists every 4 months for iron deficiency  anemia and Vit B 12 deficiency.  Diabetes  She presents for her follow-up diabetic visit. She has type 2 diabetes mellitus. Her disease course has been stable. Hypoglycemia symptoms include nervousness/anxiousness. Associated symptoms include blurred vision ("some times"), fatigue and foot paresthesias. Pertinent negatives for diabetes include no visual change. There are no hypoglycemic complications. Symptoms are stable. Diabetic complications include heart disease and nephropathy. Risk factors for coronary artery disease include diabetes mellitus, dyslipidemia, hypertension and sedentary lifestyle. She is following a generally healthy diet. Her overall blood glucose range is 130-140 mg/dl. Eye exam is current.  Hypertension  This is a chronic problem. The current episode started more than 1 year ago. The problem has been resolved since onset. The problem is controlled. Associated symptoms include anxiety, blurred vision ("some times"), malaise/fatigue, peripheral edema and shortness of breath ("some times"). Risk factors for coronary artery disease include diabetes mellitus, dyslipidemia, obesity and sedentary lifestyle. The current treatment provides moderate improvement. Hypertensive end-organ damage includes CAD/MI. Identifiable causes of hypertension include a thyroid problem.  Hyperlipidemia  This is a chronic problem. The current episode started more than 1 year ago. The problem is uncontrolled. Recent lipid tests were reviewed and are high. Exacerbating diseases include hypothyroidism and obesity. Associated symptoms include shortness of breath ("some  times"). The current treatment provides no improvement of lipids. Risk factors for coronary artery disease include diabetes mellitus, dyslipidemia, hypertension, a sedentary lifestyle and post-menopausal.  Asthma  She complains of cough, hoarse voice and shortness of breath ("some times"). This is a chronic problem. The current episode started more than 1 year ago. The problem occurs intermittently. The problem has been waxing and waning. Associated symptoms include malaise/fatigue. She reports minimal improvement on treatment. Her symptoms are not alleviated by rest. Her past medical history is significant for asthma.  Depression         This is a chronic problem.  The current episode started more than 1 year ago.   The onset quality is gradual.   The problem occurs intermittently.  The problem has been waxing and waning since onset.  Associated symptoms include decreased concentration, fatigue, helplessness, hopelessness, irritable, restlessness and sad.  Past medical history includes hypothyroidism, thyroid problem and anxiety.   Anxiety  Presents for follow-up visit. Symptoms include decreased concentration, depressed mood, excessive worry, irritability, nervous/anxious behavior, restlessness and shortness of breath ("some times"). Symptoms occur most days. The severity of symptoms is moderate.   Her past medical history is significant for asthma.  Thyroid Problem  Presents for follow-up visit. Symptoms include anxiety, depressed mood, fatigue and hoarse voice. Patient reports no visual change. The symptoms have been stable. Her past medical history is significant for hyperlipidemia.      Review of Systems  Constitutional: Positive for fatigue, irritability and malaise/fatigue.  HENT: Positive for hoarse voice.   Eyes: Positive for blurred vision ("some times").  Respiratory: Positive for cough and shortness of breath ("some times").   Psychiatric/Behavioral: Positive for decreased  concentration and depression. The patient is nervous/anxious.   All other systems reviewed and are negative.  Objective:   Physical Exam Vitals signs reviewed.  Constitutional:      General: She is irritable. She is not in acute distress.    Appearance: She is well-developed.  HENT:     Head: Normocephalic and atraumatic.     Right Ear: Tympanic membrane normal.     Left Ear: Tympanic membrane normal.  Eyes:     Pupils: Pupils are equal, round, and reactive to light.  Neck:     Musculoskeletal: Normal range of motion and neck supple.     Thyroid: No thyromegaly.  Cardiovascular:     Rate and Rhythm: Normal rate and regular rhythm.     Heart sounds: Normal heart sounds. No murmur.  Pulmonary:     Effort: Pulmonary effort is normal. No respiratory distress.     Breath sounds: Normal breath sounds. No wheezing.  Abdominal:     General: Bowel sounds are normal. There is no distension.     Palpations: Abdomen is soft.     Tenderness: There is no abdominal tenderness.  Musculoskeletal:        General: No tenderness.     Comments: Using cane  Skin:    General: Skin is warm and dry.  Neurological:     Mental Status: She is alert and oriented to person, place, and time.     Cranial Nerves: No cranial nerve deficit.     Deep Tendon Reflexes: Reflexes are normal and symmetric.  Psychiatric:        Behavior: Behavior normal.        Thought Content: Thought content normal.        Judgment: Judgment normal.       BP 138/86   Pulse 74   Temp (!) 96.8 F (36 C) (Oral)   Ht 5' 4.5" (1.638 m)   Wt 194 lb 12.8 oz (88.4 kg)   BMI 32.92 kg/m       Assessment & Plan:  Denise Gardner comes in today with chief complaint of Diabetes (four month recheck)   Diagnosis and orders addressed:  1. Hypertension associated with diabetes ([place])  2. Uncomplicated asthma, unspecified asthma severity, unspecified whether persistent  3. Hyperlipidemia associated with type 2 diabetes  mellitus ([place]) - Lipid panel  4. Type 2 diabetes mellitus with diabetic polyneuropathy, without long-term current use of insulin ([facility]) - Bayer DCA Hb A1c Waived - metFORMIN (GLUCOPHAGE XR) 500 MG 24 hr tablet; Take 2 tablets (1,000 mg total) by mouth daily with breakfast.  Dispense: 180 tablet; Refill: 1  5. Hypothyroidism, unspecified type - TSH  6. Iron deficiency anemia, unspecified iron deficiency anemia type  7. Obesity (BMI 30-39.9)  8. Moderate episode of recurrent major depressive disorder ([place])  9. Anxiety  10. Chronic coronary artery disease  11. Vitamin B 12 deficiency   Labs pending, labs reviewed from [place] Maintenance reviewed Diet and exercise encouraged  Follow up plan: 4 months    Evelina Dun, FNP

## 2018-03-28 ENCOUNTER — Telehealth: Payer: Self-pay | Admitting: *Deleted

## 2018-03-28 LAB — LIPID PANEL
Chol/HDL Ratio: 3.3 ratio (ref 0.0–4.4)
Cholesterol, Total: 178 mg/dL (ref 100–199)
HDL: 54 mg/dL (ref >39)
LDL Calculated: 105 mg/dL — ABNORMAL HIGH (ref 0–99)
Triglycerides: 93 mg/dL (ref 0–149)
VLDL Cholesterol Cal: 19 mg/dL (ref 5–40)

## 2018-03-28 LAB — TSH: TSH: 1.77 u[IU]/mL (ref 0.450–4.500)

## 2018-03-28 NOTE — Telephone Encounter (Signed)
Patient aware to decrease her metformin dose to equal 500 mg twice daily.  Pharmacy had filled her 1000 mg twice daily recently and is unsure if they can get new script filled due to insurance.  They will continue to pursue this.

## 2018-04-29 ENCOUNTER — Other Ambulatory Visit: Payer: Self-pay | Admitting: Family

## 2018-05-13 ENCOUNTER — Other Ambulatory Visit: Payer: Self-pay | Admitting: Family

## 2018-05-13 DIAGNOSIS — M8580 Other specified disorders of bone density and structure, unspecified site: Secondary | ICD-10-CM

## 2018-05-23 ENCOUNTER — Other Ambulatory Visit: Payer: Self-pay | Admitting: Family

## 2018-05-29 ENCOUNTER — Other Ambulatory Visit: Payer: Self-pay | Admitting: Family

## 2018-05-29 DIAGNOSIS — M8580 Other specified disorders of bone density and structure, unspecified site: Secondary | ICD-10-CM

## 2018-05-29 DIAGNOSIS — E1149 Type 2 diabetes mellitus with other diabetic neurological complication: Secondary | ICD-10-CM

## 2018-06-07 ENCOUNTER — Other Ambulatory Visit: Payer: Self-pay | Admitting: Family

## 2018-06-07 DIAGNOSIS — R69 Illness, unspecified: Secondary | ICD-10-CM | POA: Diagnosis not present

## 2018-06-07 DIAGNOSIS — J069 Acute upper respiratory infection, unspecified: Secondary | ICD-10-CM

## 2018-06-07 DIAGNOSIS — J309 Allergic rhinitis, unspecified: Secondary | ICD-10-CM

## 2018-06-08 ENCOUNTER — Other Ambulatory Visit: Payer: Self-pay | Admitting: Family

## 2018-06-09 ENCOUNTER — Other Ambulatory Visit: Payer: Self-pay | Admitting: Family

## 2018-06-09 DIAGNOSIS — E1149 Type 2 diabetes mellitus with other diabetic neurological complication: Secondary | ICD-10-CM

## 2018-06-09 DIAGNOSIS — M8580 Other specified disorders of bone density and structure, unspecified site: Secondary | ICD-10-CM

## 2018-06-11 DIAGNOSIS — R69 Illness, unspecified: Secondary | ICD-10-CM | POA: Diagnosis not present

## 2018-06-12 ENCOUNTER — Telehealth: Payer: Self-pay

## 2018-06-12 MED ORDER — FLUTICASONE FUROATE 100 MCG/ACT IN AEPB: 1.0000 | INHALATION_SPRAY | Freq: Every day | RESPIRATORY_TRACT | 6 refills | Status: DC

## 2018-06-12 NOTE — Addendum Note (Signed)
Addended by: Evelina Dun A on: 06/12/2018 02:18 PM   Modules accepted: Orders

## 2018-06-12 NOTE — Telephone Encounter (Signed)
Left voicemail for patient that Qvar was changed to Arnuity.

## 2018-06-12 NOTE — Telephone Encounter (Signed)
Patient's insurance denied [employer], [employer], preferred is Arnuity.

## 2018-06-12 NOTE — Telephone Encounter (Signed)
Qvar changed to [place] per insurance

## 2018-06-14 ENCOUNTER — Other Ambulatory Visit: Payer: Self-pay | Admitting: Family

## 2018-06-14 DIAGNOSIS — F329 Major depressive disorder, single episode, unspecified: Secondary | ICD-10-CM

## 2018-06-14 DIAGNOSIS — R69 Illness, unspecified: Secondary | ICD-10-CM | POA: Diagnosis not present

## 2018-06-14 DIAGNOSIS — F419 Anxiety disorder, unspecified: Secondary | ICD-10-CM

## 2018-06-14 DIAGNOSIS — F32A Depression, unspecified: Secondary | ICD-10-CM

## 2018-06-14 NOTE — Telephone Encounter (Signed)
Next OV 07/31/18

## 2018-06-27 ENCOUNTER — Other Ambulatory Visit: Payer: Self-pay | Admitting: Family

## 2018-07-12 ENCOUNTER — Other Ambulatory Visit: Payer: Self-pay

## 2018-07-12 ENCOUNTER — Inpatient Hospital Stay (HOSPITAL_COMMUNITY): Payer: Medicare HMO | Attending: Hematology

## 2018-07-12 DIAGNOSIS — E538 Deficiency of other specified B group vitamins: Secondary | ICD-10-CM | POA: Diagnosis not present

## 2018-07-12 DIAGNOSIS — D509 Iron deficiency anemia, unspecified: Secondary | ICD-10-CM | POA: Diagnosis not present

## 2018-07-12 DIAGNOSIS — E1140 Type 2 diabetes mellitus with diabetic neuropathy, unspecified: Secondary | ICD-10-CM | POA: Diagnosis not present

## 2018-07-12 LAB — IRON AND TIBC
Iron: 107 ug/dL (ref 28–170)
Saturation Ratios: 34 % — ABNORMAL HIGH (ref 10.4–31.8)
TIBC: 313 ug/dL (ref 250–450)
UIBC: 206 ug/dL

## 2018-07-12 LAB — CBC WITH DIFFERENTIAL/PLATELET
Abs Immature Granulocytes: 0.02 10*3/uL (ref 0.00–0.07)
Basophils Absolute: 0.1 10*3/uL (ref 0.0–0.1)
Basophils Relative: 1 %
Eosinophils Absolute: 0.3 10*3/uL (ref 0.0–0.5)
Eosinophils Relative: 3 %
HCT: 40.3 % (ref 36.0–46.0)
Hemoglobin: 13.1 g/dL (ref 12.0–15.0)
Immature Granulocytes: 0 %
Lymphocytes Relative: 35 %
Lymphs Abs: 3.2 10*3/uL (ref 0.7–4.0)
MCH: 30.3 pg (ref 26.0–34.0)
MCHC: 32.5 g/dL (ref 30.0–36.0)
MCV: 93.3 fL (ref 80.0–100.0)
Monocytes Absolute: 0.7 10*3/uL (ref 0.1–1.0)
Monocytes Relative: 8 %
Neutro Abs: 4.8 10*3/uL (ref 1.7–7.7)
Neutrophils Relative %: 53 %
Platelets: 182 10*3/uL (ref 150–400)
RBC: 4.32 MIL/uL (ref 3.87–5.11)
RDW: 12.7 % (ref 11.5–15.5)
WBC: 9 10*3/uL (ref 4.0–10.5)
nRBC: 0 % (ref 0.0–0.2)

## 2018-07-12 LAB — VITAMIN B12: Vitamin B-12: 311 pg/mL (ref 180–914)

## 2018-07-12 LAB — COMPREHENSIVE METABOLIC PANEL
ALT: 13 U/L (ref 0–44)
AST: 16 U/L (ref 15–41)
Albumin: 3.6 g/dL (ref 3.5–5.0)
Alkaline Phosphatase: 30 U/L — ABNORMAL LOW (ref 38–126)
Anion gap: 9 (ref 5–15)
BUN: 20 mg/dL (ref 8–23)
CO2: 30 mmol/L (ref 22–32)
Calcium: 9.6 mg/dL (ref 8.9–10.3)
Chloride: 101 mmol/L (ref 98–111)
Creatinine, Ser: 1.07 mg/dL — ABNORMAL HIGH (ref 0.44–1.00)
GFR calc Af Amer: 59 mL/min — ABNORMAL LOW (ref >60)
GFR calc non Af Amer: 51 mL/min — ABNORMAL LOW (ref >60)
Glucose, Bld: 104 mg/dL — ABNORMAL HIGH (ref 70–99)
Potassium: 4 mmol/L (ref 3.5–5.1)
Sodium: 140 mmol/L (ref 135–145)
Total Bilirubin: 0.7 mg/dL (ref 0.3–1.2)
Total Protein: 7 g/dL (ref 6.5–8.1)

## 2018-07-12 LAB — FERRITIN: Ferritin: 143 ng/mL (ref 11–307)

## 2018-07-12 LAB — FOLATE: Folate: 21.9 ng/mL (ref >5.9)

## 2018-07-20 ENCOUNTER — Other Ambulatory Visit: Payer: Self-pay

## 2018-07-20 ENCOUNTER — Encounter (HOSPITAL_COMMUNITY): Payer: Self-pay | Admitting: Nurse Practitioner

## 2018-07-20 ENCOUNTER — Inpatient Hospital Stay (HOSPITAL_COMMUNITY): Payer: Medicare HMO | Admitting: Nurse Practitioner

## 2018-07-20 VITALS — BP 123/58 | HR 64 | Temp 98.0°F | Resp 16 | Wt 194.3 lb

## 2018-07-20 DIAGNOSIS — M25551 Pain in right hip: Secondary | ICD-10-CM

## 2018-07-20 DIAGNOSIS — M25552 Pain in left hip: Secondary | ICD-10-CM

## 2018-07-20 DIAGNOSIS — D509 Iron deficiency anemia, unspecified: Secondary | ICD-10-CM | POA: Diagnosis not present

## 2018-07-20 DIAGNOSIS — E1140 Type 2 diabetes mellitus with diabetic neuropathy, unspecified: Secondary | ICD-10-CM

## 2018-07-20 DIAGNOSIS — E538 Deficiency of other specified B group vitamins: Secondary | ICD-10-CM

## 2018-07-20 MED ORDER — NAPROXEN 500 MG PO TABS: ORAL_TABLET | ORAL | 0 refills | Status: DC

## 2018-07-20 NOTE — Patient Instructions (Signed)
Denise Gardner at [facility] Discharge Instructions  Follow up in 4 months with labs  Please start taking vit B 12 daily.   Thank you for choosing [place] [facility] to provide your oncology and hematology care.  To afford each patient quality time with our provider, please arrive at least 15 minutes before your scheduled appointment time.   If you have a lab appointment with the [place] please come in thru the  Main Entrance and check in at the main information desk  You need to re-schedule your appointment should you arrive 10 or more minutes late.  We strive to give you quality time with our providers, and arriving late affects you and other patients whose appointments are after yours.  Also, if you no show three or more times for appointments you may be dismissed from the clinic at the providers discretion.     Again, thank you for choosing [facility].  Our hope is that these requests will decrease the amount of time that you wait before being seen by our physicians.       _____________________________________________________________  Should you have questions after your visit to [facility], please contact our office at [phone] between the hours of 8:00 a.m. and 4:30 p.m.  Voicemails left after 4:00 p.m. will not be returned until the following business day.  For prescription refill requests, have your pharmacy contact our office and allow 72 hours.    Cancer Center Support Programs:   > Cancer Support Group  2nd Tuesday of the month 1pm-2pm, Journey Room

## 2018-07-20 NOTE — Progress Notes (Signed)
[place], [place] [address]   CLINIC:  Medical Oncology/Hematology  PCP:  Sharion Balloon, FNP [address] [place] [address] [phone]   REASON FOR VISIT: Follow-up for iron deficiency anemia and vitamin B12 deficiency  CURRENT THERAPY: Oral iron and oral B12   INTERVAL HISTORY:  Denise Gardner 73 y.o. female returns for routine follow-up for iron deficiency anemia and vitamin B12 deficiency. She reports she has been doing well since her last visit.  She reports she feels a little more fatigue since stopping her vitamin B12.  She reports she is having bilateral hip pain and joint pain from her arthritis.  And is requesting a refill of her naproxen. Denies any nausea, vomiting, or diarrhea. Denies any new pains. Had not noticed any recent bleeding such as epistaxis, hematuria or hematochezia. Denies recent chest pain on exertion, shortness of breath on minimal exertion, pre-syncopal episodes, or palpitations. Denies any numbness or tingling in hands or feet. Denies any recent fevers, infections, or recent hospitalizations. Patient reports appetite at 75% and energy level at 50%.  She is eating well and maintaining her weight at this time.    REVIEW OF SYSTEMS:  Review of Systems  Constitutional: Positive for fatigue.  Musculoskeletal: Positive for arthralgias.  All other systems reviewed and are negative.    PAST MEDICAL/SURGICAL HISTORY:  Past Medical History:  Diagnosis Date  . Anxiety    depression  . Asthma   . Cataract   . Chronic back pain   . Coronary artery disease    Taxus stent to RCA 2008 2.5 x 16, non obstructive 15% proximal right coronary artery, patent curcumflex LAD, preserved LV  . Diabetes mellitus without complication ([place])   . GERD (gastroesophageal reflux disease)   . Hyperlipidemia   . Hypertension   . Insomnia   . Migraine   . Neuropathy   . Shingles   . Thyroid disease   . Ulcer, stomach peptic     Past Surgical History:  Procedure Laterality Date  . TOTAL ABDOMINAL HYSTERECTOMY       SOCIAL HISTORY:  Social History   Socioeconomic History  . Marital status: Married    Spouse name: Not on file  . Number of children: Not on file  . Years of education: Not on file  . Highest education level: Not on file  Occupational History  . Occupation: retired    Comment: Higher education careers adviser  . Financial resource strain: Not on file  . Food insecurity:    Worry: Not on file    Inability: Not on file  . Transportation needs:    Medical: Not on file    Non-medical: Not on file  Tobacco Use  . Smoking status: Never Smoker  . Smokeless tobacco: Never Used  Substance and Sexual Activity  . Alcohol use: No  . Drug use: No  . Sexual activity: Not Currently  Lifestyle  . Physical activity:    Days per week: Not on file    Minutes per session: Not on file  . Stress: Not on file  Relationships  . Social connections:    Talks on phone: Not on file    Gets together: Not on file    Attends religious service: Not on file    Active member of club or organization: Not on file    Attends meetings of clubs or organizations: Not on file    Relationship status: Not on  file  . Intimate partner violence:    Fear of current or ex partner: Not on file    Emotionally abused: Not on file    Physically abused: Not on file    Forced sexual activity: Not on file  Other Topics Concern  . Not on file  Social History Narrative   Has a husband but has not lived with him for 4 years.  No children.      FAMILY HISTORY:  Family History  Problem Relation Age of Onset  . Stroke Mother 63  . Heart disease Mother   . Coronary artery disease Brother 16  . Diabetes Brother   . Heart disease Brother   . Coronary artery disease Brother 92  . Diabetes Brother   . Heart disease Brother   . Cancer Sister        breast  . Heart disease Sister   . Diabetes Sister   . Heart disease Sister    . Diabetes Sister   . Heart disease Sister   . Diabetes Sister   . Heart disease Sister   . Diabetes Brother   . Heart disease Brother   . Heart attack Brother   . Heart disease Brother   . Heart disease Brother   . Heart disease Brother     CURRENT MEDICATIONS:  Outpatient Encounter Medications as of 07/20/2018  Medication Sig  . ACCU-CHEK FASTCLIX LANCETS MISC USE TO CHECK BLOOD SUGAR UP TO FOUR TIMES DAILY  . acetaminophen (TYLENOL) 500 MG tablet Take 500 mg by mouth every 6 (six) hours as needed.  Marland Kitchen amLODipine (NORVASC) 10 MG tablet TAKE 1 TABLET(10 MG) BY MOUTH DAILY  . aspirin 81 MG chewable tablet Chew 81 mg by mouth daily.  . blood glucose meter kit and supplies Dispense based on patient and insurance preference. Use up to four times daily as directed. (FOR ICD-10 E10.9, E11.9).  . Calcium-Magnesium-Vitamin D (CALCIUM 1200+D3 PO) Take 1 capsule by mouth.  . cholecalciferol (VITAMIN D) 400 UNITS TABS Take by mouth. VITAMIN D3 daily   . diazepam (VALIUM) 5 MG tablet TAKE 1 TABLET BY MOUTH EVERY 12 HOURS AS NEEDED  . diphenhydrAMINE (BENADRYL) 25 mg capsule Take 25 mg by mouth every 6 (six) hours as needed.  . docusate sodium (COLACE) 100 MG capsule Take 100 mg by mouth 2 (two) times daily.  Marland Kitchen escitalopram (LEXAPRO) 10 MG tablet TAKE 1 TABLET BY MOUTH EVERY DAY  . Ferrous Sulfate (IRON) 325 (65 Fe) MG TABS Take 1 tablet by mouth.  . fluticasone (FLONASE) 50 MCG/ACT nasal spray SHAKE LIQUID AND USE 2 SPRAYS IN EACH NOSTRIL DAILY  . Fluticasone Furoate (ARNUITY ELLIPTA) 100 MCG/ACT AEPB Inhale 1 puff into the lungs daily.  Marland Kitchen gabapentin (NEURONTIN) 300 MG capsule TAKE 1 CAPSULE BY MOUTH THREE TIMES DAILY  . ipratropium-albuterol (DUONEB) 0.5-2.5 (3) MG/3ML SOLN USE 1 VIAL VIA NEBULIZER EVERY 6 HOURS  . isosorbide mononitrate (IMDUR) 60 MG 24 hr tablet TAKE 1 TABLET BY MOUTH EVERY MORNING  . JANUVIA 100 MG tablet TAKE 1 TABLET BY MOUTH EVERY DAY. DISCONTINUE GLIPIZIDE.  Marland Kitchen  levothyroxine (SYNTHROID, LEVOTHROID) 88 MCG tablet TAKE 1 TABLET BY MOUTH DAILY  . metFORMIN (GLUCOPHAGE XR) 500 MG 24 hr tablet Take 2 tablets (1,000 mg total) by mouth daily with breakfast.  . metoprolol tartrate (LOPRESSOR) 25 MG tablet TAKE 1 TABLET BY MOUTH TWICE DAILY  . misoprostol (CYTOTEC) 200 MCG tablet TAKE 1 TABLET BY MOUTH FOUR TIMES DAILY  . montelukast (  SINGULAIR) 10 MG tablet TAKE 1 TABLET BY MOUTH EVERY NIGHT AT BEDTIME  . Multiple Vitamins-Minerals (HAIR SKIN AND NAILS FORMULA PO) Take 1 tablet by mouth daily.  . naproxen (NAPROSYN) 500 MG tablet TAKE 1 TABLET(500 MG) BY MOUTH TWICE DAILY WITH A MEAL  . nitroGLYCERIN (NITROSTAT) 0.4 MG SL tablet DISSOLVE 1 TABLET UNDER THE TONGUE EVERY 5 MINUTES FOR UP TO 3 DOSES AS NEEDED FOR CHEST PAIN AS DIRECTED  . omeprazole (PRILOSEC) 20 MG capsule TAKE 1 CAPSULE(20 MG) BY MOUTH TWICE DAILY BEFORE A MEAL  . ONE TOUCH ULTRA TEST test strip TEST TWICE DAILY.  Marland Kitchen psyllium (REGULOID) 0.52 g capsule Take 0.52 g by mouth daily.  Marland Kitchen QVAR REDIHALER 40 MCG/ACT inhaler INHALE 2 PUFFS BY MOUTH INTO THE LUNGS TWICE DAILY  . raloxifene (EVISTA) 60 MG tablet TAKE 1 TABLET(60 MG) BY MOUTH DAILY  . VASCEPA 1 g CAPS TAKE 4 CAPSULES BY MOUTH EVERY DAY  . vitamin B-12 (CYANOCOBALAMIN) 1000 MCG tablet Take 1,000 mcg by mouth daily.  . [DISCONTINUED] naproxen (NAPROSYN) 500 MG tablet TAKE 1 TABLET(500 MG) BY MOUTH TWICE DAILY WITH A MEAL   No facility-administered encounter medications on file as of 07/20/2018.     ALLERGIES:  Allergies  Allergen Reactions  . Milk-Related Compounds Itching and Nausea And Vomiting  . Eggs Or Egg-Derived Products Itching, Nausea And Vomiting and Other (See Comments)    headaches  . Lac Bovis Other (See Comments)    Per PCP office  . Other     Other reaction(s): Other (See Comments) Per [facility] Other reaction(s): Other (See Comments) Per [facility]  . Crestor [Rosuvastatin]     Myalgias   .  Ezetimibe Other (See Comments)    mylagia Per PCP office     PHYSICAL EXAM:  ECOG Performance status: 1  Vitals:   07/20/18 1000  BP: (!) 123/58  Pulse: 64  Resp: 16  Temp: 98 F (36.7 C)  SpO2: 94%   Filed Weights   07/20/18 1000  Weight: 194 lb 5 oz (88.1 kg)    Physical Exam Constitutional:      Appearance: Normal appearance. She is normal weight.  Cardiovascular:     Rate and Rhythm: Normal rate and regular rhythm.     Heart sounds: Normal heart sounds.  Pulmonary:     Effort: Pulmonary effort is normal.     Breath sounds: Normal breath sounds.  Abdominal:     General: Bowel sounds are normal.     Palpations: Abdomen is soft.  Musculoskeletal: Normal range of motion.  Skin:    General: Skin is warm and dry.  Neurological:     Mental Status: She is alert and oriented to person, place, and time. Mental status is at baseline.  Psychiatric:        Mood and Affect: Mood normal.        Behavior: Behavior normal.        Thought Content: Thought content normal.        Judgment: Judgment normal.      LABORATORY DATA:  I have reviewed the labs as listed.  CBC    Component Value Date/Time   WBC 9.0 07/12/2018 1201   RBC 4.32 07/12/2018 1201   HGB 13.1 07/12/2018 1201   HGB 10.4 (L) 05/25/2017 1229   HCT 40.3 07/12/2018 1201   HCT 33.7 (L) 05/25/2017 1229   PLT 182 07/12/2018 1201   PLT 292 05/25/2017 1229   MCV 93.3  07/12/2018 1201   MCV 80 05/25/2017 1229   MCH 30.3 07/12/2018 1201   MCHC 32.5 07/12/2018 1201   RDW 12.7 07/12/2018 1201   RDW 17.2 (H) 05/25/2017 1229   LYMPHSABS 3.2 07/12/2018 1201   LYMPHSABS 3.1 05/25/2017 1229   MONOABS 0.7 07/12/2018 1201   EOSABS 0.3 07/12/2018 1201   EOSABS 0.2 05/25/2017 1229   BASOSABS 0.1 07/12/2018 1201   BASOSABS 0.1 05/25/2017 1229   CMP Latest Ref Rng & Units 07/12/2018 03/12/2018 12/07/2017  Glucose 70 - 99 mg/dL 104(H) 119(H) 116(H)  BUN 8 - 23 mg/dL _0 Creatinine 0.44 - 1.00 mg/dL 1.07(H)  1.25(H) 1.11(H)  Sodium 135 - 145 mmol/L 140 139 140  Potassium 3.5 - 5.1 mmol/L 4.0 4.2 4.3  Chloride 98 - 111 mmol/L 101 104 104  CO2 22 - 32 mmol/L _1 Calcium 8.9 - 10.3 mg/dL 9.6 9.8 9.9  Total Protein 6.5 - 8.1 g/dL 7.0 6.6 6.8  Total Bilirubin 0.3 - 1.2 mg/dL 0.7 0.7 0.7  Alkaline Phos 38 - 126 U/L 30(L) 29(L) 29(L)  AST 15 - 41 U/L _2 ALT 0 - 44 U/L _3 I personally performed a face-to-face visit.  All questions were answered to patient's stated satisfaction. Encouraged patient to call with any new concerns or questions before his next visit to the cancer center and we can certain see him sooner, if needed.     ASSESSMENT & PLAN:   Iron deficiency anemia 1.  Normocytic anemia: -Likely from a combination of iron deficiency, CKD and B12 deficiency. - She last had Feraheme on 07/05/2017 and 07/12/2017. -She takes 1 tablet of iron daily.  She does get constipated however the stool softeners we recommended are helping. - She denies any bleeding per rectum or melena. -Labs on 07/12/2018 showed her WBC 9.0, hemoglobin 13.1, ferritin 143, and percent saturation 34.  Creatinine at this time is 1.07 which is down from 1.25 - I do not recommend any IV iron at this time.  She will continue to take her oral iron daily. - She will follow-up in 4 months with repeat labs.  2.  B12 deficiency: - Labs on 07/12/2018 showed her vitamin B12 level 311. -Her vitamin B12 was continued last time due to her B12 level being over 1000. - I have recommended she start taking her vitamin B12 back. - She will continue to take this daily.  We will follow-up labs next time.       Orders placed this encounter:  Orders Placed This Encounter  Procedures  . Lactate dehydrogenase  . CBC with Differential/Platelet  . Comprehensive metabolic panel  . Ferritin  . Iron and TIBC  . Vitamin B12  . VITAMIN D 25 Hydroxy (Vit-D Deficiency, Fractures)  . Folate      Francene Finders, FNP-C  [place] [phone]

## 2018-07-20 NOTE — Assessment & Plan Note (Addendum)
1.  Normocytic anemia: -Likely from a combination of iron deficiency, CKD and B12 deficiency. - She last had Feraheme on 07/05/2017 and 07/12/2017. -She takes 1 tablet of iron daily.  She does get constipated however the stool softeners we recommended are helping. - She denies any bleeding per rectum or melena. -Labs on 07/12/2018 showed her WBC 9.0, hemoglobin 13.1, ferritin 143, and percent saturation 34.  Creatinine at this time is 1.07 which is down from 1.25 - I do not recommend any IV iron at this time.  She will continue to take her oral iron daily. - She will follow-up in 4 months with repeat labs.  2.  B12 deficiency: - Labs on 07/12/2018 showed her vitamin B12 level 311. -Her vitamin B12 was continued last time due to her B12 level being over 1000. - I have recommended she start taking her vitamin B12 back. - She will continue to take this daily.  We will follow-up labs next time.

## 2018-07-31 ENCOUNTER — Ambulatory Visit: Payer: Medicare HMO | Admitting: Family

## 2018-08-03 DIAGNOSIS — R69 Illness, unspecified: Secondary | ICD-10-CM | POA: Diagnosis not present

## 2018-08-15 ENCOUNTER — Other Ambulatory Visit: Payer: Self-pay

## 2018-08-16 ENCOUNTER — Encounter: Payer: Self-pay | Admitting: Family

## 2018-08-16 ENCOUNTER — Ambulatory Visit (INDEPENDENT_AMBULATORY_CARE_PROVIDER_SITE_OTHER): Payer: Medicare HMO | Admitting: Family

## 2018-08-16 VITALS — BP 143/80 | HR 65 | Temp 97.7°F | Ht 64.5 in | Wt 192.4 lb

## 2018-08-16 DIAGNOSIS — E1159 Type 2 diabetes mellitus with other circulatory complications: Secondary | ICD-10-CM

## 2018-08-16 DIAGNOSIS — E785 Hyperlipidemia, unspecified: Secondary | ICD-10-CM

## 2018-08-16 DIAGNOSIS — I2510 Atherosclerotic heart disease of native coronary artery without angina pectoris: Secondary | ICD-10-CM | POA: Diagnosis not present

## 2018-08-16 DIAGNOSIS — D509 Iron deficiency anemia, unspecified: Secondary | ICD-10-CM

## 2018-08-16 DIAGNOSIS — K5900 Constipation, unspecified: Secondary | ICD-10-CM

## 2018-08-16 DIAGNOSIS — F331 Major depressive disorder, recurrent, moderate: Secondary | ICD-10-CM

## 2018-08-16 DIAGNOSIS — J45909 Unspecified asthma, uncomplicated: Secondary | ICD-10-CM

## 2018-08-16 DIAGNOSIS — F1320 Sedative, hypnotic or anxiolytic dependence, uncomplicated: Secondary | ICD-10-CM

## 2018-08-16 DIAGNOSIS — F329 Major depressive disorder, single episode, unspecified: Secondary | ICD-10-CM

## 2018-08-16 DIAGNOSIS — E1169 Type 2 diabetes mellitus with other specified complication: Secondary | ICD-10-CM | POA: Diagnosis not present

## 2018-08-16 DIAGNOSIS — R69 Illness, unspecified: Secondary | ICD-10-CM | POA: Diagnosis not present

## 2018-08-16 DIAGNOSIS — E039 Hypothyroidism, unspecified: Secondary | ICD-10-CM | POA: Diagnosis not present

## 2018-08-16 DIAGNOSIS — K219 Gastro-esophageal reflux disease without esophagitis: Secondary | ICD-10-CM

## 2018-08-16 DIAGNOSIS — I152 Hypertension secondary to endocrine disorders: Secondary | ICD-10-CM

## 2018-08-16 DIAGNOSIS — Z79899 Other long term (current) drug therapy: Secondary | ICD-10-CM

## 2018-08-16 DIAGNOSIS — I10 Essential (primary) hypertension: Secondary | ICD-10-CM

## 2018-08-16 DIAGNOSIS — E669 Obesity, unspecified: Secondary | ICD-10-CM

## 2018-08-16 DIAGNOSIS — J449 Chronic obstructive pulmonary disease, unspecified: Secondary | ICD-10-CM

## 2018-08-16 DIAGNOSIS — E1142 Type 2 diabetes mellitus with diabetic polyneuropathy: Secondary | ICD-10-CM | POA: Diagnosis not present

## 2018-08-16 DIAGNOSIS — E1149 Type 2 diabetes mellitus with other diabetic neurological complication: Secondary | ICD-10-CM

## 2018-08-16 DIAGNOSIS — F32A Depression, unspecified: Secondary | ICD-10-CM

## 2018-08-16 DIAGNOSIS — F419 Anxiety disorder, unspecified: Secondary | ICD-10-CM

## 2018-08-16 DIAGNOSIS — M8580 Other specified disorders of bone density and structure, unspecified site: Secondary | ICD-10-CM

## 2018-08-16 LAB — BAYER DCA HB A1C WAIVED: HB A1C (BAYER DCA - WAIVED): 5.8 % (ref <7.0)

## 2018-08-16 MED ORDER — METFORMIN HCL ER 500 MG PO TB24: 1000.0000 mg | ORAL_TABLET | Freq: Every day | ORAL | 1 refills | Status: DC

## 2018-08-16 MED ORDER — VASCEPA 1 G PO CAPS: ORAL_CAPSULE | ORAL | 0 refills | Status: DC

## 2018-08-16 MED ORDER — AMLODIPINE BESYLATE 10 MG PO TABS: ORAL_TABLET | ORAL | 1 refills | Status: DC

## 2018-08-16 MED ORDER — METOPROLOL TARTRATE 25 MG PO TABS: 25.0000 mg | ORAL_TABLET | Freq: Two times a day (BID) | ORAL | 1 refills | Status: DC

## 2018-08-16 MED ORDER — LEVOTHYROXINE SODIUM 88 MCG PO TABS: 88.0000 ug | ORAL_TABLET | Freq: Every day | ORAL | 2 refills | Status: DC

## 2018-08-16 MED ORDER — RALOXIFENE HCL 60 MG PO TABS: ORAL_TABLET | ORAL | 0 refills | Status: DC

## 2018-08-16 MED ORDER — ESCITALOPRAM OXALATE 10 MG PO TABS: 10.0000 mg | ORAL_TABLET | Freq: Every day | ORAL | 1 refills | Status: DC

## 2018-08-16 MED ORDER — QVAR REDIHALER 40 MCG/ACT IN AERB: INHALATION_SPRAY | RESPIRATORY_TRACT | 3 refills | Status: DC

## 2018-08-16 MED ORDER — DIAZEPAM 5 MG PO TABS: 5.0000 mg | ORAL_TABLET | Freq: Two times a day (BID) | ORAL | 4 refills | Status: DC | PRN

## 2018-08-16 MED ORDER — GABAPENTIN 300 MG PO CAPS: 300.0000 mg | ORAL_CAPSULE | Freq: Three times a day (TID) | ORAL | 1 refills | Status: DC

## 2018-08-16 MED ORDER — MONTELUKAST SODIUM 10 MG PO TABS: 10.0000 mg | ORAL_TABLET | Freq: Every day | ORAL | 0 refills | Status: DC

## 2018-08-16 MED ORDER — SITAGLIPTIN PHOSPHATE 100 MG PO TABS: ORAL_TABLET | ORAL | 1 refills | Status: DC

## 2018-08-16 NOTE — Patient Instructions (Signed)
Asthma, Adult    Asthma is a long-term (chronic) condition that causes recurrent episodes in which the airways become tight and narrow. The airways are the passages that lead from the nose and mouth down into the lungs. Asthma episodes, also called asthma attacks, can cause coughing, wheezing, shortness of breath, and chest pain. The airways can also fill with mucus. During an attack, it can be difficult to breathe. Asthma attacks can range from minor to life threatening.  Asthma cannot be cured, but medicines and lifestyle changes can help control it and treat acute attacks.  What are the causes?  This condition is believed to be caused by inherited (genetic) and environmental factors, but its exact cause is not known.  There are many things that can bring on an asthma attack or make asthma symptoms worse (triggers). Asthma triggers are different for each person. Common triggers include:   Mold.   Dust.   Cigarette smoke.   Cockroaches.   Things that can cause allergy symptoms (allergens), such as animal dander or pollen from trees or grass.   Air pollutants such as household cleaners, wood smoke, smog, or chemical odors.   Cold air, weather changes, and winds (which increase molds and pollen in the air).   Strong emotional expressions such as crying or laughing hard.   Stress.   Certain medicines (such as aspirin) or types of medicines (such as beta-blockers).   Sulfites in foods and drinks. Foods and drinks that may contain sulfites include dried fruit, potato chips, and sparkling grape juice.   Infections or inflammatory conditions such as the flu, a cold, or inflammation of the nasal membranes (rhinitis).   Gastroesophageal reflux disease (GERD).   Exercise or strenuous activity.  What are the signs or symptoms?  Symptoms of this condition may occur right after asthma is triggered or many hours later. Symptoms include:   Wheezing. This can sound like whistling when you breathe.   Excessive  nighttime or early morning coughing.   Frequent or severe coughing with a common cold.   Chest tightness.   Shortness of breath.   Tiredness (fatigue) with minimal activity.  How is this diagnosed?  This condition is diagnosed based on:   Your medical history.   A physical exam.   Tests, which may include:  ? Lung function studies and pulmonary studies (spirometry). These tests can evaluate the flow of air in your lungs.  ? Allergy tests.  ? Imaging tests, such as X-rays.  How is this treated?  There is no cure for this condition, but treatment can help control your symptoms. Treatment for asthma usually involves:   Identifying and avoiding your asthma triggers.   Using medicines to control your symptoms. Generally, two types of medicines are used to treat asthma:  ? Controller medicines. These help prevent asthma symptoms from occurring. They are usually taken every day.  ? Fast-acting reliever or rescue medicines. These quickly relieve asthma symptoms by widening the narrow and tight airways. They are used as needed and provide short-term relief.   Using supplemental oxygen. This may be needed during a severe episode.   Using other medicines, such as:  ? Allergy medicines, such as antihistamines, if your asthma attacks are triggered by allergens.  ? Immune medicines (immunomodulators). These are medicines that help control the immune system.   Creating an asthma action plan. An asthma action plan is a written plan for managing and treating your asthma attacks. This plan includes:  ? A list   of your asthma triggers and how to avoid them.  ? Information about when medicines should be taken and when their dosage should be changed.  ? Instructions about using a device called a peak flow meter. A peak flow meter measures how well the lungs are working and the severity of your asthma. It helps you monitor your condition.  Follow these instructions at home:  Controlling your home environment  Control your home  environment in the following ways to help avoid triggers and prevent asthma attacks:   Change your heating and air conditioning filter regularly.   Limit your use of fireplaces and wood stoves.   Get rid of pests (such as roaches and mice) and their droppings.   Throw away plants if you see mold on them.   Clean floors and dust surfaces regularly. Use unscented cleaning products.   Try to have someone else vacuum for you regularly. Stay out of rooms while they are being vacuumed and for a short while afterward. If you vacuum, use a dust mask from a hardware store, a double-layered or microfilter vacuum cleaner bag, or a vacuum cleaner with a HEPA filter.   Replace carpet with wood, tile, or vinyl flooring. Carpet can trap dander and dust.   Use allergy-proof pillows, mattress covers, and box spring covers.   Keep your bedroom a trigger-free room.   Avoid pets and keep windows closed when allergens are in the air.   Wash beddings every week in hot water and dry them in a dryer.   Use blankets that are made of polyester or cotton.   Clean bathrooms and kitchens with bleach. If possible, have someone repaint the walls in these rooms with mold-resistant paint. Stay out of the rooms that are being cleaned and painted.   Wash your hands often with soap and water. If soap and water are not available, use hand sanitizer.   Do not allow anyone to smoke in your home.  General instructions   Take over-the-counter and prescription medicines only as told by your health care provider.  ? Speak with your health care provider if you have questions about how or when to take the medicines.  ? Make note if you are requiring more frequent dosages.   Do not use any products that contain nicotine or tobacco, such as cigarettes and e-cigarettes. If you need help quitting, ask your health care provider. Also, avoid being exposed to secondhand smoke.   Use a peak flow meter as told by your health care provider. Record and  keep track of the readings.   Understand and use the asthma action plan to help minimize, or stop an asthma attack, without needing to seek medical care.   Make sure you stay up to date on your yearly vaccinations as told by your health care provider. This may include vaccines for the flu and pneumonia.   Avoid outdoor activities when allergen counts are high and when air quality is low.   Wear a ski mask that covers your nose and mouth during outdoor winter activities. Exercise indoors on cold days if you can.   Warm up before exercising, and take time for a cool-down period after exercise.   Keep all follow-up visits as told by your health care provider. This is important.  Where to find more information   For information about asthma, turn to the Centers for Disease Control and Prevention at www.cdc.gov/asthma/faqs.htm   For air quality information, turn to AirNow at https://airnow.gov/  Contact   a health care provider if:   You have wheezing, shortness of breath, or a cough even while you are taking medicine to prevent attacks.   The mucus you cough up (sputum) is thicker than usual.   Your sputum changes from clear or white to yellow, green, gray, or bloody.   Your medicines are causing side effects, such as a rash, itching, swelling, or trouble breathing.   You need to use a reliever medicine more than 2-3 times a week.   Your peak flow reading is still at 50-79% of your personal best after following your action plan for 1 hour.   You have a fever.  Get help right away if:   You are getting worse and do not respond to treatment during an asthma attack.   You are short of breath when at rest or when doing very little physical activity.   You have difficulty eating, drinking, or talking.   You have chest pain or tightness.   You develop a fast heartbeat or palpitations.   You have a bluish color to your lips or fingernails.   You are light-headed or dizzy, or you faint.   Your peak flow  reading is less than 50% of your personal best.   You feel too tired to breathe normally.  Summary   Asthma is a long-term (chronic) condition that causes recurrent episodes in which the airways become tight and narrow. These episodes can cause coughing, wheezing, shortness of breath, and chest pain.   Asthma cannot be cured, but medicines and lifestyle changes can help control it and treat acute attacks.   Make sure you understand how to avoid triggers and how and when to use your medicines.   Asthma attacks can range from minor to life threatening. Get help right away if you have an asthma attack and do not respond to treatment with your usual rescue medicines.  This information is not intended to replace advice given to you by your health care provider. Make sure you discuss any questions you have with your health care provider.  Document Released: 02/14/2005 Document Revised: 03/21/2016 Document Reviewed: 03/21/2016  Elsevier Interactive Patient Education  2019 [employer]

## 2018-08-16 NOTE — Addendum Note (Signed)
Addended by: Shelbie Ammons on: 08/16/2018 11:36 AM   Modules accepted: Orders

## 2018-08-16 NOTE — Progress Notes (Signed)
Subjective:    Patient ID: Denise Gardner, female    DOB: [birth year], 73 y.o.   MRN: [id number]  Chief Complaint  Patient presents with   Medical Management of Chronic Issues   Diabetes   Pt presents to the office today for chronic follow up. She is followed by  Cardiologists annually for CAD. She is followed by Hematologists every 4 months for iron deficiency  anemia and Vit B 12 deficiency.   Pt states she has been using Arnuity 100 mcg daily because her insurance would not pay for her QVAR. She states she can not take the Arnuity because it is causing her hoarseness and cough.  Diabetes She presents for her follow-up diabetic visit. She has type 2 diabetes mellitus. Her disease course has been stable. Hypoglycemia symptoms include nervousness/anxiousness. Associated symptoms include fatigue and foot paresthesias. Pertinent negatives for diabetes include no blurred vision. Symptoms are stable. Diabetic complications include heart disease and peripheral neuropathy. Pertinent negatives for diabetic complications include no CVA. Risk factors for coronary artery disease include diabetes mellitus, dyslipidemia, hypertension, sedentary lifestyle and post-menopausal. She is following a diabetic diet. Her overall blood glucose range is 110-130 mg/dl.  Hypertension This is a chronic problem. The current episode started more than 1 year ago. The problem has been resolved since onset. The problem is controlled. Associated symptoms include anxiety, malaise/fatigue, peripheral edema ("a little") and shortness of breath ("at times"). Pertinent negatives include no blurred vision. Risk factors for coronary artery disease include diabetes mellitus, sedentary lifestyle, obesity and dyslipidemia. The current treatment provides moderate improvement. Hypertensive end-organ damage includes CAD/MI. There is no history of CVA. Identifiable causes of hypertension include a thyroid problem.  Asthma She complains of  cough, hoarse voice and shortness of breath ("at times"). This is a chronic problem. The current episode started more than 1 year ago. The problem occurs intermittently. The problem has been waxing and waning. Associated symptoms include malaise/fatigue. Her symptoms are not alleviated by rest and OTC cough suppressant. Her past medical history is significant for asthma.  Depression        This is a chronic problem.  The current episode started more than 1 year ago.   The onset quality is gradual.   The problem occurs intermittently.  The problem has been waxing and waning since onset.  Associated symptoms include decreased concentration, fatigue, restlessness, decreased interest and sad.  Associated symptoms include no helplessness and no hopelessness.  Past medical history includes thyroid problem and anxiety.   Anxiety Presents for follow-up visit. Symptoms include decreased concentration, excessive worry, irritability, nervous/anxious behavior, restlessness and shortness of breath ("at times"). Symptoms occur most days. The severity of symptoms is moderate.   Her past medical history is significant for anemia and asthma.  Constipation This is a chronic problem. The current episode started more than 1 year ago. The problem has been waxing and waning since onset. She has tried laxatives for the symptoms. The treatment provided moderate relief.  Thyroid Problem Presents for follow-up visit. Symptoms include anxiety, constipation, fatigue and hoarse voice. The symptoms have been stable.  Anemia Presents for follow-up visit. Symptoms include malaise/fatigue.  COPD Pt states she has been using Arnuity 100 mcg daily because her insurance would not pay for her QVAR. She states she can not take the Arnuity because it is causing her hoarseness and cough. Diabetic Neuropathy PT taking gabapentin 300 mg TID. States she has intermittent aching pain in bilateral feet of 7 out 10.  Review of Systems    Constitutional: Positive for fatigue, irritability and malaise/fatigue.  HENT: Positive for hoarse voice.   Eyes: Negative for blurred vision.  Respiratory: Positive for cough and shortness of breath ("at times").   Gastrointestinal: Positive for constipation.  Psychiatric/Behavioral: Positive for decreased concentration and depression. The patient is nervous/anxious.   All other systems reviewed and are negative.      Objective:   Physical Exam Vitals signs reviewed.  Constitutional:      General: She is not in acute distress.    Appearance: Normal appearance. She is well-developed. She is obese.  HENT:     Head: Normocephalic and atraumatic.     Right Ear: Tympanic membrane normal.     Left Ear: Tympanic membrane normal.  Eyes:     Pupils: Pupils are equal, round, and reactive to light.  Neck:     Musculoskeletal: Normal range of motion and neck supple.     Thyroid: No thyromegaly.  Cardiovascular:     Rate and Rhythm: Normal rate and regular rhythm.     Heart sounds: Normal heart sounds. No murmur.  Pulmonary:     Effort: Pulmonary effort is normal. No respiratory distress.     Breath sounds: Decreased breath sounds present. No wheezing.  Abdominal:     General: Bowel sounds are normal. There is no distension.     Palpations: Abdomen is soft.     Tenderness: There is no abdominal tenderness.  Musculoskeletal:        General: No tenderness.     Comments: Generalized weakness, using cane to walk  Skin:    General: Skin is warm and dry.  Neurological:     Mental Status: She is alert and oriented to person, place, and time.     Cranial Nerves: No cranial nerve deficit.     Deep Tendon Reflexes: Reflexes are normal and symmetric.  Psychiatric:        Behavior: Behavior normal.        Thought Content: Thought content normal.        Judgment: Judgment normal.     Diabetic Foot Exam - Simple   Simple Foot Form Diabetic Foot exam was performed with the following  findings: Yes 08/16/2018 10:59 AM  Visual Inspection No deformities, no ulcerations, no other skin breakdown bilaterally: Yes Sensation Testing Intact to touch and monofilament testing bilaterally: Yes Pulse Check Posterior Tibialis and Dorsalis pulse intact bilaterally: Yes Comments      BP (!) 143/80    Pulse 65    Temp 97.7 F (36.5 C) (Oral)    Ht 5' 4.5" (1.638 m)    Wt 192 lb 6.4 oz (87.3 kg)    BMI 32.52 kg/m      Assessment & Plan:  Denise Gardner comes in today with chief complaint of Medical Management of Chronic Issues and Diabetes   Diagnosis and orders addressed:  1. Hypertension associated with diabetes ([place]) - CMP14+EGFR - amLODipine (NORVASC) 10 MG tablet; TAKE 1 TABLET(10 MG) BY MOUTH DAILY  Dispense: 90 tablet; Refill: 1 - metoprolol tartrate (LOPRESSOR) 25 MG tablet; Take 1 tablet (25 mg total) by mouth 2 (two) times daily.  Dispense: 180 tablet; Refill: 1  2. Chronic coronary artery disease - CMP14+EGFR  3. Chronic obstructive pulmonary disease, unspecified COPD type (HCC) - CMP14+EGFR - beclomethasone (QVAR REDIHALER) 40 MCG/ACT inhaler; INHALE 2 PUFFS BY MOUTH INTO THE LUNGS TWICE DAILY  Dispense: 31.8 g; Refill: 3  4. Gastroesophageal reflux disease, esophagitis  presence not specified - CMP14+EGFR  5. Type 2 diabetes mellitus with diabetic polyneuropathy, without long-term current use of insulin (HCC) - Bayer DCA Hb A1c Waived - CMP14+EGFR - Microalbumin / creatinine urine ratio - sitaGLIPtin (JANUVIA) 100 MG tablet; TAKE 1 TABLET BY MOUTH EVERY DAY. DISCONTINUE GLIPIZIDE.  Dispense: 90 tablet; Refill: 1 - metFORMIN (GLUCOPHAGE XR) 500 MG 24 hr tablet; Take 2 tablets (1,000 mg total) by mouth daily with breakfast.  Dispense: 180 tablet; Refill: 1  6. Hypothyroidism, unspecified type - CMP14+EGFR - TSH - levothyroxine (SYNTHROID) 88 MCG tablet; Take 1 tablet (88 mcg total) by mouth daily.  Dispense: 90 tablet; Refill: 2  7. Hyperlipidemia  associated with type 2 diabetes mellitus (HCC) - CMP14+EGFR - Lipid panel  8. Diabetic polyneuropathy associated with type 2 diabetes mellitus (HCC) - CMP14+EGFR  9. Obesity (BMI 30-39.9) - CMP14+EGFR  10. Iron deficiency anemia, unspecified iron deficiency anemia type - CMP14+EGFR  11. Anxiety - CMP14+EGFR - diazepam (VALIUM) 5 MG tablet; Take 1 tablet (5 mg total) by mouth every 12 (twelve) hours as needed.  Dispense: 60 tablet; Refill: 4 - escitalopram (LEXAPRO) 10 MG tablet; Take 1 tablet (10 mg total) by mouth daily.  Dispense: 90 tablet; Refill: 1 - gabapentin (NEURONTIN) 300 MG capsule; Take 1 capsule (300 mg total) by mouth 3 (three) times daily.  Dispense: 270 capsule; Refill: 1  12. Constipation, unspecified constipation type - CMP14+EGFR  13. Moderate episode of recurrent major depressive disorder (HCC) - CMP14+EGFR  14. Depression - escitalopram (LEXAPRO) 10 MG tablet; Take 1 tablet (10 mg total) by mouth daily.  Dispense: 90 tablet; Refill: 1 - gabapentin (NEURONTIN) 300 MG capsule; Take 1 capsule (300 mg total) by mouth 3 (three) times daily.  Dispense: 270 capsule; Refill: 1  15. Diabetes mellitus type 2 with neurological manifestations ([place]) - sitaGLIPtin (JANUVIA) 100 MG tablet; TAKE 1 TABLET BY MOUTH EVERY DAY. DISCONTINUE GLIPIZIDE.  Dispense: 90 tablet; Refill: 1  16. Osteopenia - raloxifene (EVISTA) 60 MG tablet; TAKE 1 TABLET(60 MG) BY MOUTH DAILY  Dispense: 90 tablet; Refill: 0  17. Benzodiazepine dependence ([place]) -Pt reviewed in [place] controlled database- No red flags noted - diazepam (VALIUM) 5 MG tablet; Take 1 tablet (5 mg total) by mouth every 12 (twelve) hours as needed.  Dispense: 60 tablet; Refill: 4  18. Uncomplicated asthma, unspecified asthma severity, unspecified whether persistent   Labs pending Health Maintenance reviewed Diet and exercise encouraged  Follow up plan: 4 months   Evelina Dun, FNP

## 2018-08-17 ENCOUNTER — Telehealth: Payer: Self-pay

## 2018-08-17 ENCOUNTER — Telehealth: Payer: Self-pay | Admitting: *Deleted

## 2018-08-17 ENCOUNTER — Other Ambulatory Visit: Payer: Self-pay | Admitting: Family

## 2018-08-17 DIAGNOSIS — J449 Chronic obstructive pulmonary disease, unspecified: Secondary | ICD-10-CM

## 2018-08-17 DIAGNOSIS — E1142 Type 2 diabetes mellitus with diabetic polyneuropathy: Secondary | ICD-10-CM

## 2018-08-17 LAB — LIPID PANEL
Chol/HDL Ratio: 3.6 ratio (ref 0.0–4.4)
Cholesterol, Total: 206 mg/dL — ABNORMAL HIGH (ref 100–199)
HDL: 57 mg/dL (ref >39)
LDL Calculated: 126 mg/dL — ABNORMAL HIGH (ref 0–99)
Triglycerides: 117 mg/dL (ref 0–149)
VLDL Cholesterol Cal: 23 mg/dL (ref 5–40)

## 2018-08-17 LAB — CMP14+EGFR
ALT: 11 IU/L (ref 0–32)
AST: 17 IU/L (ref 0–40)
Albumin/Globulin Ratio: 1.6 (ref 1.2–2.2)
Albumin: 4.1 g/dL (ref 3.7–4.7)
Alkaline Phosphatase: 32 IU/L — ABNORMAL LOW (ref 39–117)
BUN/Creatinine Ratio: 19 (ref 12–28)
BUN: 21 mg/dL (ref 8–27)
Bilirubin Total: 0.5 mg/dL (ref 0.0–1.2)
CO2: 27 mmol/L (ref 20–29)
Calcium: 9.9 mg/dL (ref 8.7–10.3)
Chloride: 98 mmol/L (ref 96–106)
Creatinine, Ser: 1.12 mg/dL — ABNORMAL HIGH (ref 0.57–1.00)
GFR calc Af Amer: 56 mL/min/{1.73_m2} — ABNORMAL LOW (ref >59)
GFR calc non Af Amer: 48 mL/min/{1.73_m2} — ABNORMAL LOW (ref >59)
Globulin, Total: 2.6 g/dL (ref 1.5–4.5)
Glucose: 111 mg/dL — ABNORMAL HIGH (ref 65–99)
Potassium: 4.2 mmol/L (ref 3.5–5.2)
Sodium: 139 mmol/L (ref 134–144)
Total Protein: 6.7 g/dL (ref 6.0–8.5)

## 2018-08-17 LAB — MICROALBUMIN / CREATININE URINE RATIO
Creatinine, Urine: 54.2 mg/dL
Microalb/Creat Ratio: <6 mg/g creat (ref 0–29)
Microalbumin, Urine: <3 ug/mL

## 2018-08-17 LAB — TSH: TSH: 0.224 u[IU]/mL — ABNORMAL LOW (ref 0.450–4.500)

## 2018-08-17 MED ORDER — METFORMIN HCL ER 500 MG PO TB24: 500.0000 mg | ORAL_TABLET | Freq: Every day | ORAL | 3 refills | Status: DC

## 2018-08-17 MED ORDER — LEVOTHYROXINE SODIUM 75 MCG PO TABS: 75.0000 ug | ORAL_TABLET | Freq: Every day | ORAL | 11 refills | Status: DC

## 2018-08-17 NOTE — Telephone Encounter (Signed)
Patient tried this and states it caused coughing and hoarseness. She states the Qvar worked for her in the past. Can we get this approved?

## 2018-08-17 NOTE — Telephone Encounter (Signed)
Patient's insurance will not pay for Qvar.  Preferred alternative is Arnuity Ellipta.

## 2018-08-17 NOTE — Telephone Encounter (Signed)
Fax from [facility] is not covered Preferred alternative is Arnuityelpt inh mcg Please advise

## 2018-08-19 LAB — TOXASSURE SELECT 13 (MW), URINE

## 2018-08-20 NOTE — Telephone Encounter (Signed)
I will submit a prior authorization.  If it doesn't get approved, other formulary alternatives are:  Advair Breo  Flovent  Pulmicort Serevent Symbicort

## 2018-08-21 ENCOUNTER — Other Ambulatory Visit: Payer: Self-pay | Admitting: Family

## 2018-08-25 ENCOUNTER — Other Ambulatory Visit: Payer: Self-pay | Admitting: Family

## 2018-08-25 DIAGNOSIS — K5900 Constipation, unspecified: Secondary | ICD-10-CM

## 2018-09-12 ENCOUNTER — Other Ambulatory Visit: Payer: Self-pay | Admitting: Family

## 2018-09-12 DIAGNOSIS — J4520 Mild intermittent asthma, uncomplicated: Secondary | ICD-10-CM

## 2018-09-12 DIAGNOSIS — J069 Acute upper respiratory infection, unspecified: Secondary | ICD-10-CM

## 2018-09-12 DIAGNOSIS — R69 Illness, unspecified: Secondary | ICD-10-CM | POA: Diagnosis not present

## 2018-09-12 DIAGNOSIS — K2100 Gastro-esophageal reflux disease with esophagitis, without bleeding: Secondary | ICD-10-CM

## 2018-09-12 DIAGNOSIS — J309 Allergic rhinitis, unspecified: Secondary | ICD-10-CM

## 2018-09-13 ENCOUNTER — Other Ambulatory Visit: Payer: Self-pay | Admitting: Family

## 2018-09-13 DIAGNOSIS — E1159 Type 2 diabetes mellitus with other circulatory complications: Secondary | ICD-10-CM

## 2018-09-13 DIAGNOSIS — F32A Depression, unspecified: Secondary | ICD-10-CM

## 2018-09-13 DIAGNOSIS — I152 Hypertension secondary to endocrine disorders: Secondary | ICD-10-CM

## 2018-09-13 DIAGNOSIS — M25552 Pain in left hip: Secondary | ICD-10-CM

## 2018-09-13 DIAGNOSIS — F419 Anxiety disorder, unspecified: Secondary | ICD-10-CM

## 2018-09-13 DIAGNOSIS — F329 Major depressive disorder, single episode, unspecified: Secondary | ICD-10-CM

## 2018-09-13 DIAGNOSIS — M25551 Pain in right hip: Secondary | ICD-10-CM

## 2018-09-22 DIAGNOSIS — R69 Illness, unspecified: Secondary | ICD-10-CM | POA: Diagnosis not present

## 2018-10-08 ENCOUNTER — Other Ambulatory Visit: Payer: Self-pay | Admitting: Family

## 2018-10-31 ENCOUNTER — Other Ambulatory Visit: Payer: Self-pay | Admitting: *Deleted

## 2018-10-31 DIAGNOSIS — F419 Anxiety disorder, unspecified: Secondary | ICD-10-CM

## 2018-10-31 DIAGNOSIS — F1320 Sedative, hypnotic or anxiolytic dependence, uncomplicated: Secondary | ICD-10-CM

## 2018-10-31 MED ORDER — NITROGLYCERIN 0.4 MG SL SUBL: SUBLINGUAL_TABLET | SUBLINGUAL | 1 refills | Status: DC

## 2018-11-01 ENCOUNTER — Other Ambulatory Visit: Payer: Self-pay | Admitting: *Deleted

## 2018-11-01 DIAGNOSIS — F1320 Sedative, hypnotic or anxiolytic dependence, uncomplicated: Secondary | ICD-10-CM

## 2018-11-01 DIAGNOSIS — F419 Anxiety disorder, unspecified: Secondary | ICD-10-CM

## 2018-11-13 ENCOUNTER — Inpatient Hospital Stay (HOSPITAL_COMMUNITY): Payer: Medicare Other | Attending: Hematology

## 2018-11-13 ENCOUNTER — Other Ambulatory Visit: Payer: Self-pay

## 2018-11-13 ENCOUNTER — Telehealth: Payer: Self-pay | Admitting: Family

## 2018-11-13 DIAGNOSIS — F419 Anxiety disorder, unspecified: Secondary | ICD-10-CM | POA: Insufficient documentation

## 2018-11-13 DIAGNOSIS — E785 Hyperlipidemia, unspecified: Secondary | ICD-10-CM | POA: Diagnosis not present

## 2018-11-13 DIAGNOSIS — Z791 Long term (current) use of non-steroidal anti-inflammatories (NSAID): Secondary | ICD-10-CM | POA: Diagnosis not present

## 2018-11-13 DIAGNOSIS — Z7951 Long term (current) use of inhaled steroids: Secondary | ICD-10-CM | POA: Insufficient documentation

## 2018-11-13 DIAGNOSIS — Z90710 Acquired absence of both cervix and uterus: Secondary | ICD-10-CM | POA: Insufficient documentation

## 2018-11-13 DIAGNOSIS — I10 Essential (primary) hypertension: Secondary | ICD-10-CM | POA: Insufficient documentation

## 2018-11-13 DIAGNOSIS — Z79899 Other long term (current) drug therapy: Secondary | ICD-10-CM | POA: Insufficient documentation

## 2018-11-13 DIAGNOSIS — Z7982 Long term (current) use of aspirin: Secondary | ICD-10-CM | POA: Insufficient documentation

## 2018-11-13 DIAGNOSIS — D509 Iron deficiency anemia, unspecified: Secondary | ICD-10-CM | POA: Insufficient documentation

## 2018-11-13 DIAGNOSIS — F329 Major depressive disorder, single episode, unspecified: Secondary | ICD-10-CM | POA: Insufficient documentation

## 2018-11-13 DIAGNOSIS — E079 Disorder of thyroid, unspecified: Secondary | ICD-10-CM | POA: Insufficient documentation

## 2018-11-13 DIAGNOSIS — Z803 Family history of malignant neoplasm of breast: Secondary | ICD-10-CM | POA: Diagnosis not present

## 2018-11-13 DIAGNOSIS — Z7984 Long term (current) use of oral hypoglycemic drugs: Secondary | ICD-10-CM | POA: Insufficient documentation

## 2018-11-13 DIAGNOSIS — J45909 Unspecified asthma, uncomplicated: Secondary | ICD-10-CM | POA: Insufficient documentation

## 2018-11-13 DIAGNOSIS — E119 Type 2 diabetes mellitus without complications: Secondary | ICD-10-CM | POA: Diagnosis not present

## 2018-11-13 LAB — CBC WITH DIFFERENTIAL/PLATELET
Abs Immature Granulocytes: 0.03 10*3/uL (ref 0.00–0.07)
Basophils Absolute: 0.1 10*3/uL (ref 0.0–0.1)
Basophils Relative: 1 %
Eosinophils Absolute: 0.3 10*3/uL (ref 0.0–0.5)
Eosinophils Relative: 4 %
HCT: 40.8 % (ref 36.0–46.0)
Hemoglobin: 13.1 g/dL (ref 12.0–15.0)
Immature Granulocytes: 0 %
Lymphocytes Relative: 32 %
Lymphs Abs: 2.5 10*3/uL (ref 0.7–4.0)
MCH: 30.3 pg (ref 26.0–34.0)
MCHC: 32.1 g/dL (ref 30.0–36.0)
MCV: 94.2 fL (ref 80.0–100.0)
Monocytes Absolute: 0.6 10*3/uL (ref 0.1–1.0)
Monocytes Relative: 8 %
Neutro Abs: 4.2 10*3/uL (ref 1.7–7.7)
Neutrophils Relative %: 55 %
Platelets: 169 10*3/uL (ref 150–400)
RBC: 4.33 MIL/uL (ref 3.87–5.11)
RDW: 13 % (ref 11.5–15.5)
WBC: 7.7 10*3/uL (ref 4.0–10.5)
nRBC: 0 % (ref 0.0–0.2)

## 2018-11-13 LAB — COMPREHENSIVE METABOLIC PANEL WITH GFR
ALT: 14 U/L (ref 0–44)
AST: 17 U/L (ref 15–41)
Albumin: 3.6 g/dL (ref 3.5–5.0)
Alkaline Phosphatase: 29 U/L — ABNORMAL LOW (ref 38–126)
Anion gap: 8 (ref 5–15)
BUN: 16 mg/dL (ref 8–23)
CO2: 28 mmol/L (ref 22–32)
Calcium: 9.3 mg/dL (ref 8.9–10.3)
Chloride: 101 mmol/L (ref 98–111)
Creatinine, Ser: 1.19 mg/dL — ABNORMAL HIGH (ref 0.44–1.00)
GFR calc Af Amer: 52 mL/min — ABNORMAL LOW
GFR calc non Af Amer: 45 mL/min — ABNORMAL LOW
Glucose, Bld: 116 mg/dL — ABNORMAL HIGH (ref 70–99)
Potassium: 3.8 mmol/L (ref 3.5–5.1)
Sodium: 137 mmol/L (ref 135–145)
Total Bilirubin: 1.1 mg/dL (ref 0.3–1.2)
Total Protein: 6.8 g/dL (ref 6.5–8.1)

## 2018-11-13 LAB — FERRITIN: Ferritin: 154 ng/mL (ref 11–307)

## 2018-11-13 LAB — IRON AND TIBC
Iron: 72 ug/dL (ref 28–170)
Saturation Ratios: 24 % (ref 10.4–31.8)
TIBC: 299 ug/dL (ref 250–450)
UIBC: 227 ug/dL

## 2018-11-13 LAB — LACTATE DEHYDROGENASE: LDH: 155 U/L (ref 98–192)

## 2018-11-13 LAB — VITAMIN B12: Vitamin B-12: 952 pg/mL — ABNORMAL HIGH (ref 180–914)

## 2018-11-13 LAB — FOLATE: Folate: 16 ng/mL (ref >5.9)

## 2018-11-13 MED ORDER — NITROGLYCERIN 0.4 MG SL SUBL: SUBLINGUAL_TABLET | SUBLINGUAL | 1 refills | Status: DC

## 2018-11-13 NOTE — Telephone Encounter (Signed)
Med sent.  Patient aware

## 2018-11-14 LAB — VITAMIN D 25 HYDROXY (VIT D DEFICIENCY, FRACTURES): Vit D, 25-Hydroxy: 46.4 ng/mL (ref 30.0–100.0)

## 2018-11-20 ENCOUNTER — Other Ambulatory Visit: Payer: Self-pay

## 2018-11-20 ENCOUNTER — Inpatient Hospital Stay (HOSPITAL_COMMUNITY): Payer: Medicare Other | Admitting: Nurse Practitioner

## 2018-11-20 DIAGNOSIS — D509 Iron deficiency anemia, unspecified: Secondary | ICD-10-CM

## 2018-11-20 NOTE — Patient Instructions (Signed)
[facility] at [facility] Discharge Instructions  Follow up in 4 months with labs    Thank you for choosing [facility] at [facility] to provide your oncology and hematology care.  To afford each patient quality time with our provider, please arrive at least 15 minutes before your scheduled appointment time.   If you have a lab appointment with the Cancer Center please come in thru the Main Entrance and check in at the main information desk.  You need to re-schedule your appointment should you arrive 10 or more minutes late.  We strive to give you quality time with our providers, and arriving late affects you and other patients whose appointments are after yours.  Also, if you no show three or more times for appointments you may be dismissed from the clinic at the providers discretion.     Again, thank you for choosing [facility].  Our hope is that these requests will decrease the amount of time that you wait before being seen by our physicians.       _____________________________________________________________  Should you have questions after your visit to [facility], please contact our office at [phone] between the hours of 8:00 a.m. and 4:30 p.m.  Voicemails left after 4:00 p.m. will not be returned until the following business day.  For prescription refill requests, have your pharmacy contact our office and allow 72 hours.    Due to Covid, you will need to wear a mask upon entering the hospital. If you do not have a mask, a mask will be given to you at the Main Entrance upon arrival. For doctor visits, patients may have 1 support person with them. For treatment visits, patients can not have anyone with them due to social distancing guidelines and our immunocompromised population.      

## 2018-11-20 NOTE — Progress Notes (Signed)
[place] [place], [place] [address]   CLINIC:  Medical Oncology/Hematology  PCP:  Sharion Balloon, [place] [address] [phone]   REASON FOR VISIT: Follow-up for normocytic anemia  CURRENT THERAPY: Oral iron supplementation   INTERVAL HISTORY:  Denise Gardner 73 y.o. female returns for routine follow-up for normocytic anemia.  She reports she is still a little fatigued however she does feel better than last visit.  She reports she had a fall last week and she is a little bruised however she did not injure or break anything.  She denies any bright red bleeding per rectum or melena.  She does report occasional constipation but the stool softener she is taking is helping.  Denies any nausea, vomiting, or diarrhea. Denies any new pains. Had not noticed any recent bleeding such as epistaxis, hematuria or hematochezia. Denies recent chest pain on exertion, shortness of breath on minimal exertion, pre-syncopal episodes, or palpitations. Denies any numbness or tingling in hands or feet. Denies any recent fevers, infections, or recent hospitalizations. Patient reports appetite at 100% and energy level at 25%.  She is eating well maintaining her weight at this time.     REVIEW OF SYSTEMS:  Review of Systems  Constitutional: Positive for fatigue.  Gastrointestinal: Positive for constipation.  Psychiatric/Behavioral: Positive for sleep disturbance.  All other systems reviewed and are negative.    PAST MEDICAL/SURGICAL HISTORY:  Past Medical History:  Diagnosis Date  . Anxiety    depression  . Asthma   . Cataract   . Chronic back pain   . Coronary artery disease    Taxus stent to RCA 2008 2.5 x 16, non obstructive 15% proximal right coronary artery, patent curcumflex LAD, preserved LV  . Diabetes mellitus without complication ([place])   . GERD (gastroesophageal reflux disease)   . Hyperlipidemia   . Hypertension   . Insomnia   .  Migraine   . Neuropathy   . Shingles   . Thyroid disease   . Ulcer, stomach peptic    Past Surgical History:  Procedure Laterality Date  . TOTAL ABDOMINAL HYSTERECTOMY       SOCIAL HISTORY:  Social History   Socioeconomic History  . Marital status: Married    Spouse name: Not on file  . Number of children: Not on file  . Years of education: Not on file  . Highest education level: Not on file  Occupational History  . Occupation: retired    Comment: Higher education careers adviser  . Financial resource strain: Not on file  . Food insecurity    Worry: Not on file    Inability: Not on file  . Transportation needs    Medical: Not on file    Non-medical: Not on file  Tobacco Use  . Smoking status: Never Smoker  . Smokeless tobacco: Never Used  Substance and Sexual Activity  . Alcohol use: No  . Drug use: No  . Sexual activity: Not Currently  Lifestyle  . Physical activity    Days per week: Not on file    Minutes per session: Not on file  . Stress: Not on file  Relationships  . Social Herbalist on phone: Not on file    Gets together: Not on file    Attends religious service: Not on file    Active member of club or organization: Not on file    Attends meetings of clubs  or organizations: Not on file    Relationship status: Not on file  . Intimate partner violence    Fear of current or ex partner: Not on file    Emotionally abused: Not on file    Physically abused: Not on file    Forced sexual activity: Not on file  Other Topics Concern  . Not on file  Social History Narrative   Has a husband but has not lived with him for 73 years.  No children.      FAMILY HISTORY:  Family History  Problem Relation Age of Onset  . Stroke Mother 75  . Heart disease Mother   . Coronary artery disease Brother 60  . Diabetes Brother   . Heart disease Brother   . Coronary artery disease Brother 74  . Diabetes Brother   . Heart disease Brother   . Cancer Sister         breast  . Heart disease Sister   . Diabetes Sister   . Heart disease Sister   . Diabetes Sister   . Heart disease Sister   . Diabetes Sister   . Heart disease Sister   . Diabetes Brother   . Heart disease Brother   . Heart attack Brother   . Heart disease Brother   . Heart disease Brother   . Heart disease Brother     CURRENT MEDICATIONS:  Outpatient Encounter Medications as of 11/20/2018  Medication Sig  . ACCU-CHEK FASTCLIX LANCETS MISC USE TO CHECK BLOOD SUGAR UP TO FOUR TIMES DAILY  . acetaminophen (TYLENOL) 500 MG tablet Take 500 mg by mouth 2 (two) times daily. Pt takes 2 tablets at bedtime  . amLODipine (NORVASC) 10 MG tablet TAKE 1 TABLET(10 MG) BY MOUTH DAILY  . aspirin 81 MG chewable tablet Chew 81 mg by mouth daily.  . blood glucose meter kit and supplies Dispense based on patient and insurance preference. Use up to four times daily as directed. (FOR ICD-10 E10.9, E11.9).  . Calcium-Magnesium-Vitamin D (CALCIUM 1200+D3 PO) Take 1 capsule by mouth.  . cholecalciferol (VITAMIN D) 400 UNITS TABS Take by mouth. VITAMIN D3 daily   . escitalopram (LEXAPRO) 10 MG tablet Take 1 tablet (10 mg total) by mouth daily.  . Ferrous Sulfate (IRON) 325 (65 Fe) MG TABS Take 1 tablet by mouth.  . gabapentin (NEURONTIN) 300 MG capsule TAKE 1 CAPSULE BY MOUTH THREE TIMES DAILY  . isosorbide mononitrate (IMDUR) 60 MG 24 hr tablet TAKE 1 TABLET BY MOUTH EVERY MORNING  . levothyroxine (SYNTHROID) 75 MCG tablet Take 1 tablet (75 mcg total) by mouth daily.  . metFORMIN (GLUCOPHAGE XR) 500 MG 24 hr tablet Take 1 tablet (500 mg total) by mouth daily with breakfast. (Patient taking differently: Take 500 mg by mouth 2 (two) times daily. Take 2 tablets every moring)  . metoprolol tartrate (LOPRESSOR) 25 MG tablet TAKE 1 TABLET BY MOUTH TWICE DAILY  . misoprostol (CYTOTEC) 200 MCG tablet TAKE 1 TABLET BY MOUTH FOUR TIMES DAILY  . montelukast (SINGULAIR) 10 MG tablet TAKE 1 TABLET BY MOUTH EVERY NIGHT  AT BEDTIME  . Multiple Vitamins-Minerals (HAIR SKIN AND NAILS FORMULA PO) Take 1 tablet by mouth daily.  . naproxen (NAPROSYN) 500 MG tablet TAKE 1 TABLET(500 MG) BY MOUTH TWICE DAILY WITH A MEAL  . omeprazole (PRILOSEC) 20 MG capsule TAKE 1 CAPSULE(20 MG) BY MOUTH TWICE DAILY BEFORE A MEAL  . ONE TOUCH ULTRA TEST test strip TEST TWICE DAILY.  Marland Kitchen psyllium (REGULOID)  0.52 g capsule Take 0.52 g by mouth daily.  . raloxifene (EVISTA) 60 MG tablet TAKE 1 TABLET(60 MG) BY MOUTH DAILY  . sitaGLIPtin (JANUVIA) 100 MG tablet TAKE 1 TABLET BY MOUTH EVERY DAY. DISCONTINUE GLIPIZIDE.  Marland Kitchen VASCEPA 1 g CAPS TAKE 4 CAPSULES BY MOUTH EVERY DAY  . vitamin B-12 (CYANOCOBALAMIN) 1000 MCG tablet Take 1,000 mcg by mouth daily.  . beclomethasone (QVAR REDIHALER) 40 MCG/ACT inhaler INHALE 2 PUFFS BY MOUTH INTO THE LUNGS TWICE DAILY (Patient not taking: Reported on 11/20/2018)  . diazepam (VALIUM) 5 MG tablet Take 1 tablet (5 mg total) by mouth every 12 (twelve) hours as needed. (Patient taking differently: Take 5 mg by mouth 2 (two) times daily. Pt takes one tablet at daytime and one tablet at bedtime)  . diphenhydrAMINE (BENADRYL) 25 mg capsule Take 25 mg by mouth every 6 (six) hours as needed.  . docusate sodium (COLACE) 100 MG capsule Take 100 mg by mouth 2 (two) times daily.  . fluticasone (FLONASE) 50 MCG/ACT nasal spray SHAKE LIQUID AND USE 2 SPRAYS IN EACH NOSTRIL DAILY (Patient not taking: Reported on 11/20/2018)  . ipratropium-albuterol (DUONEB) 0.5-2.5 (3) MG/3ML SOLN USE 1 VIAL VIA NEBULIZER EVERY 6 HOURS (Patient not taking: Reported on 11/20/2018)  . nitroGLYCERIN (NITROSTAT) 0.4 MG SL tablet DISSOLVE 1 TABLET UNDER THE TONGUE EVERY 5 MINUTES FOR UP TO 3 DOSES AS NEEDED FOR CHEST PAIN AS DIRECTED (Patient not taking: Reported on 11/20/2018)  . [DISCONTINUED] levothyroxine (SYNTHROID) 88 MCG tablet    No facility-administered encounter medications on file as of 11/20/2018.     ALLERGIES:  Allergies  Allergen  Reactions  . Milk-Related Compounds Itching and Nausea And Vomiting  . Eggs Or Egg-Derived Products Itching, Nausea And Vomiting and Other (See Comments)    headaches  . Lac Bovis Other (See Comments)    Per PCP office  . Other     Other reaction(s): Other (See Comments) Per [facility] Other reaction(s): Other (See Comments) Per [facility]  . Crestor [Rosuvastatin]     Myalgias   . Ezetimibe Other (See Comments)    mylagia Per PCP office     PHYSICAL EXAM:  ECOG Performance status: 1  Vitals:   11/20/18 1142  BP: 120/71  Pulse: 61  Resp: 18  Temp: (!) 97.1 F (36.2 C)  SpO2: 95%   Filed Weights   11/20/18 1142  Weight: 196 lb 9.6 oz (89.2 kg)    Physical Exam Constitutional:      Appearance: Normal appearance. She is normal weight.  Cardiovascular:     Rate and Rhythm: Normal rate and regular rhythm.     Heart sounds: Normal heart sounds.  Pulmonary:     Effort: Pulmonary effort is normal.     Breath sounds: Normal breath sounds.  Abdominal:     General: Bowel sounds are normal.     Palpations: Abdomen is soft.  Musculoskeletal: Normal range of motion.  Skin:    General: Skin is warm and dry.  Neurological:     Mental Status: She is alert and oriented to person, place, and time. Mental status is at baseline.  Psychiatric:        Mood and Affect: Mood normal.        Behavior: Behavior normal.        Thought Content: Thought content normal.        Judgment: Judgment normal.      LABORATORY DATA:  I have reviewed the labs  as listed.  CBC    Component Value Date/Time   WBC 7.7 11/13/2018 1022   RBC 4.33 11/13/2018 1022   HGB 13.1 11/13/2018 1022   HGB 10.4 (L) 05/25/2017 1229   HCT 40.8 11/13/2018 1022   HCT 33.7 (L) 05/25/2017 1229   PLT 169 11/13/2018 1022   PLT 292 05/25/2017 1229   MCV 94.2 11/13/2018 1022   MCV 80 05/25/2017 1229   MCH 30.3 11/13/2018 1022   MCHC 32.1 11/13/2018 1022   RDW 13.0 11/13/2018 1022   RDW 17.2  (H) 05/25/2017 1229   LYMPHSABS 2.5 11/13/2018 1022   LYMPHSABS 3.1 05/25/2017 1229   MONOABS 0.6 11/13/2018 1022   EOSABS 0.3 11/13/2018 1022   EOSABS 0.2 05/25/2017 1229   BASOSABS 0.1 11/13/2018 1022   BASOSABS 0.1 05/25/2017 1229   CMP Latest Ref Rng & Units 11/13/2018 08/16/2018 07/12/2018  Glucose 70 - 99 mg/dL 116(H) 111(H) 104(H)  BUN 8 - 23 mg/dL _0 Creatinine 0.44 - 1.00 mg/dL 1.19(H) 1.12(H) 1.07(H)  Sodium 135 - 145 mmol/L 137 139 140  Potassium 3.5 - 5.1 mmol/L 3.8 4.2 4.0  Chloride 98 - 111 mmol/L 101 98 101  CO2 22 - 32 mmol/L _1 Calcium 8.9 - 10.3 mg/dL 9.3 9.9 9.6  Total Protein 6.5 - 8.1 g/dL 6.8 6.7 7.0  Total Bilirubin 0.3 - 1.2 mg/dL 1.1 0.5 0.7  Alkaline Phos 38 - 126 U/L 29(L) 32(L) 30(L)  AST 15 - 41 U/L _2 ALT 0 - 44 U/L _3 I personally performed a face-to-face visit.  All questions were answered to patient's stated satisfaction. Encouraged patient to call with any new concerns or questions before his next visit to the cancer center and we can certain see him sooner, if needed.     ASSESSMENT & PLAN:   Iron deficiency anemia 1.  Normocytic anemia: -Likely from a combination of iron deficiency, CKD and B12 deficiency. - She last had Feraheme on 07/05/2017 and 07/12/2017. -She takes 1 tablet of iron daily.  She does get constipated however the stool softeners we recommended are helping. - She denies any bleeding per rectum or melena. -Labs on 11/13/2018 showed her WBC 7.7, hemoglobin 13.1, ferritin 154, and percent saturation 24.  Creatinine at this time is 1.19. - I do not recommend any IV iron at this time.  She will continue to take her oral iron daily. - She will follow-up in 4 months with repeat labs.  2.  B12 deficiency: - Labs on 11/13/2018 showed her vitamin B12 level 952. - She will continue to take B12 daily. -We will follow-up labs next time.       Orders placed this encounter:  Orders Placed This Encounter   Procedures  . Lactate dehydrogenase  . CBC with Differential/Platelet  . Comprehensive metabolic panel  . Ferritin  . Iron and TIBC  . VITAMIN D 25 Hydroxy (Vit-D Deficiency, Fractures)  . Vitamin B12  . Folate      Francene Finders, FNP-C [place] [phone]

## 2018-11-20 NOTE — Assessment & Plan Note (Addendum)
1.  Normocytic anemia: -Likely from a combination of iron deficiency, CKD and B12 deficiency. - She last had Feraheme on 07/05/2017 and 07/12/2017. -She takes 1 tablet of iron daily.  She does get constipated however the stool softeners we recommended are helping. - She denies any bleeding per rectum or melena. -Labs on 11/13/2018 showed her WBC 7.7, hemoglobin 13.1, ferritin 154, and percent saturation 24.  Creatinine at this time is 1.19. - I do not recommend any IV iron at this time.  She will continue to take her oral iron daily. - She will follow-up in 4 months with repeat labs.  2.  B12 deficiency: - Labs on 11/13/2018 showed her vitamin B12 level 952. - She will continue to take B12 daily. -We will follow-up labs next time.

## 2018-11-30 ENCOUNTER — Other Ambulatory Visit: Payer: Self-pay | Admitting: Family

## 2018-11-30 DIAGNOSIS — K5900 Constipation, unspecified: Secondary | ICD-10-CM

## 2018-11-30 MED ORDER — MISOPROSTOL 200 MCG PO TABS: 200.0000 ug | ORAL_TABLET | Freq: Four times a day (QID) | ORAL | 0 refills | Status: DC

## 2018-11-30 MED ORDER — ISOSORBIDE MONONITRATE ER 60 MG PO TB24: 60.0000 mg | ORAL_TABLET | Freq: Every morning | ORAL | 1 refills | Status: DC

## 2018-12-11 ENCOUNTER — Other Ambulatory Visit: Payer: Self-pay | Admitting: Family

## 2018-12-11 DIAGNOSIS — F329 Major depressive disorder, single episode, unspecified: Secondary | ICD-10-CM

## 2018-12-11 DIAGNOSIS — E1149 Type 2 diabetes mellitus with other diabetic neurological complication: Secondary | ICD-10-CM

## 2018-12-11 DIAGNOSIS — J309 Allergic rhinitis, unspecified: Secondary | ICD-10-CM

## 2018-12-11 DIAGNOSIS — J069 Acute upper respiratory infection, unspecified: Secondary | ICD-10-CM

## 2018-12-11 DIAGNOSIS — F419 Anxiety disorder, unspecified: Secondary | ICD-10-CM

## 2018-12-11 DIAGNOSIS — F32A Depression, unspecified: Secondary | ICD-10-CM

## 2018-12-11 DIAGNOSIS — J4520 Mild intermittent asthma, uncomplicated: Secondary | ICD-10-CM

## 2018-12-11 DIAGNOSIS — K2100 Gastro-esophageal reflux disease with esophagitis, without bleeding: Secondary | ICD-10-CM

## 2018-12-11 MED ORDER — ACCU-CHEK FASTCLIX LANCETS MISC: 3 refills | Status: DC

## 2018-12-13 ENCOUNTER — Other Ambulatory Visit: Payer: Self-pay | Admitting: Family

## 2018-12-13 DIAGNOSIS — J4520 Mild intermittent asthma, uncomplicated: Secondary | ICD-10-CM

## 2018-12-17 ENCOUNTER — Other Ambulatory Visit: Payer: Self-pay

## 2018-12-18 ENCOUNTER — Ambulatory Visit (INDEPENDENT_AMBULATORY_CARE_PROVIDER_SITE_OTHER): Payer: Medicare Other | Admitting: Family

## 2018-12-18 ENCOUNTER — Encounter: Payer: Self-pay | Admitting: Family

## 2018-12-18 VITALS — BP 127/78 | HR 58 | Temp 97.1°F | Ht 64.5 in | Wt 195.4 lb

## 2018-12-18 DIAGNOSIS — Z23 Encounter for immunization: Secondary | ICD-10-CM

## 2018-12-18 DIAGNOSIS — J45909 Unspecified asthma, uncomplicated: Secondary | ICD-10-CM | POA: Diagnosis not present

## 2018-12-18 DIAGNOSIS — I2510 Atherosclerotic heart disease of native coronary artery without angina pectoris: Secondary | ICD-10-CM | POA: Diagnosis not present

## 2018-12-18 DIAGNOSIS — E1142 Type 2 diabetes mellitus with diabetic polyneuropathy: Secondary | ICD-10-CM

## 2018-12-18 DIAGNOSIS — F419 Anxiety disorder, unspecified: Secondary | ICD-10-CM

## 2018-12-18 DIAGNOSIS — E039 Hypothyroidism, unspecified: Secondary | ICD-10-CM

## 2018-12-18 DIAGNOSIS — E1159 Type 2 diabetes mellitus with other circulatory complications: Secondary | ICD-10-CM | POA: Diagnosis not present

## 2018-12-18 DIAGNOSIS — J449 Chronic obstructive pulmonary disease, unspecified: Secondary | ICD-10-CM

## 2018-12-18 DIAGNOSIS — I10 Essential (primary) hypertension: Secondary | ICD-10-CM

## 2018-12-18 DIAGNOSIS — E785 Hyperlipidemia, unspecified: Secondary | ICD-10-CM

## 2018-12-18 DIAGNOSIS — I152 Hypertension secondary to endocrine disorders: Secondary | ICD-10-CM

## 2018-12-18 DIAGNOSIS — E538 Deficiency of other specified B group vitamins: Secondary | ICD-10-CM

## 2018-12-18 DIAGNOSIS — E1169 Type 2 diabetes mellitus with other specified complication: Secondary | ICD-10-CM

## 2018-12-18 DIAGNOSIS — D509 Iron deficiency anemia, unspecified: Secondary | ICD-10-CM

## 2018-12-18 DIAGNOSIS — F331 Major depressive disorder, recurrent, moderate: Secondary | ICD-10-CM

## 2018-12-18 DIAGNOSIS — K5900 Constipation, unspecified: Secondary | ICD-10-CM

## 2018-12-18 DIAGNOSIS — Z79899 Other long term (current) drug therapy: Secondary | ICD-10-CM

## 2018-12-18 DIAGNOSIS — E669 Obesity, unspecified: Secondary | ICD-10-CM

## 2018-12-18 LAB — BAYER DCA HB A1C WAIVED: HB A1C (BAYER DCA - WAIVED): 6.1 % (ref <7.0)

## 2018-12-18 NOTE — Progress Notes (Signed)
Subjective:    Patient ID: Denise Gardner, female    DOB: 1945-08-27, 73 y.o.   MRN: [id number]  Chief Complaint  Patient presents with  . Medical Management of Chronic Issues   Pt presents to the office today for chronic follow up.She is followed byCardiologists annually for CAD. She is followed by Hematologists every 3 months for iron deficiency anemia and Vit B 12 deficiency.   Pt states she has been using Arnuity 100 mcg daily because her insurance would not pay for her QVAR for her COPD. She states she can not take the Arnuity because it is causing her hoarseness and cough.  Diabetes She presents for her follow-up diabetic visit. She has type 2 diabetes mellitus. Her disease course has been stable. Hypoglycemia symptoms include nervousness/anxiousness. Pertinent negatives for hypoglycemia include no headaches. Associated symptoms include fatigue. Pertinent negatives for diabetes include no foot paresthesias and no visual change. Symptoms are stable. Diabetic complications include heart disease, nephropathy and peripheral neuropathy. Risk factors for coronary artery disease include diabetes mellitus, dyslipidemia, sedentary lifestyle, post-menopausal and hypertension. She is following a generally healthy diet. Her overall blood glucose range is >200 mg/dl.  Hypertension This is a chronic problem. The current episode started more than 1 year ago. The problem has been resolved since onset. The problem is controlled. Associated symptoms include anxiety, malaise/fatigue, peripheral edema ("a little bit") and shortness of breath. Pertinent negatives include no headaches. Risk factors for coronary artery disease include dyslipidemia, diabetes mellitus, obesity and sedentary lifestyle. The current treatment provides moderate improvement. Hypertensive end-organ damage includes CAD/MI. Identifiable causes of hypertension include a thyroid problem.  Asthma She complains of cough, hoarse voice and  shortness of breath. There is no difficulty breathing. This is a chronic problem. The current episode started more than 1 year ago. The problem occurs intermittently. The problem has been waxing and waning. Associated symptoms include malaise/fatigue. Pertinent negatives include no headaches. Her past medical history is significant for asthma.  Thyroid Problem Presents for follow-up visit. Symptoms include anxiety, constipation, depressed mood, fatigue and hoarse voice. Patient reports no diaphoresis, diarrhea or visual change. The symptoms have been stable.  Depression        This is a chronic problem.  The current episode started more than 1 year ago.   The onset quality is gradual.   The problem has been waxing and waning since onset.  Associated symptoms include decreased concentration, fatigue, irritable and restlessness.  Associated symptoms include no headaches.  Past medical history includes thyroid problem and anxiety.   Anxiety Presents for follow-up visit. Symptoms include decreased concentration, depressed mood, excessive worry, irritability, nervous/anxious behavior, restlessness and shortness of breath.   Her past medical history is significant for anemia and asthma.  Anemia Presents for follow-up visit. Symptoms include malaise/fatigue. There has been no bruising/bleeding easily.     Review of Systems  Constitutional: Positive for fatigue, irritability and malaise/fatigue. Negative for diaphoresis.  HENT: Positive for hoarse voice.   Respiratory: Positive for cough and shortness of breath.   Gastrointestinal: Positive for constipation. Negative for diarrhea.  Neurological: Negative for headaches.  Hematological: Does not bruise/bleed easily.  Psychiatric/Behavioral: Positive for decreased concentration and depression. The patient is nervous/anxious.   All other systems reviewed and are negative.      Objective:   Physical Exam Vitals signs reviewed.  Constitutional:       General: She is irritable. She is not in acute distress.    Appearance: She is well-developed.  HENT:     Head: Normocephalic and atraumatic.     Right Ear: Tympanic membrane normal.     Left Ear: Tympanic membrane normal.  Eyes:     Pupils: Pupils are equal, round, and reactive to light.  Neck:     Musculoskeletal: Normal range of motion and neck supple.     Thyroid: No thyromegaly.  Cardiovascular:     Rate and Rhythm: Normal rate and regular rhythm.     Heart sounds: Normal heart sounds. No murmur.  Pulmonary:     Effort: Pulmonary effort is normal. No respiratory distress.     Breath sounds: Normal breath sounds. No wheezing.  Abdominal:     General: Bowel sounds are normal. There is no distension.     Palpations: Abdomen is soft.     Tenderness: There is no abdominal tenderness.  Musculoskeletal: Normal range of motion.        General: No tenderness.  Skin:    General: Skin is warm and dry.  Neurological:     Mental Status: She is alert and oriented to person, place, and time.     Cranial Nerves: No cranial nerve deficit.     Deep Tendon Reflexes: Reflexes are normal and symmetric.  Psychiatric:        Behavior: Behavior normal.        Thought Content: Thought content normal.        Judgment: Judgment normal.       BP 127/78   Pulse (!) 58   Temp (!) 97.1 F (36.2 C) (Temporal)   Ht 5' 4.5" (1.638 m)   Wt 195 lb 6.4 oz (88.6 kg)   SpO2 95%   BMI 33.02 kg/m      Assessment & Plan:  [name] comes in today with chief complaint of Medical Management of Chronic Issues   Diagnosis and orders addressed:  1. Hypertension associated with diabetes ([place]) - CMP14+EGFR  2. Chronic coronary artery disease - CMP14+EGFR  3. Chronic obstructive pulmonary disease, unspecified COPD type ([place]) - CMP14+EGFR  4. Uncomplicated asthma, unspecified asthma severity, unspecified whether persistent - CMP14+EGFR  5. Type 2 diabetes mellitus with diabetic  polyneuropathy, without long-term current use of insulin (HCC) - Bayer DCA Hb A1c Waived - CMP14+EGFR  6. Hypothyroidism, unspecified type - CMP14+EGFR - TSH  7. Hyperlipidemia associated with type 2 diabetes mellitus (HCC) - CMP14+EGFR  8. Diabetic polyneuropathy associated with type 2 diabetes mellitus (HCC) - CMP14+EGFR  9. Iron deficiency anemia, unspecified iron deficiency anemia type  - CMP14+EGFR  10. Moderate episode of recurrent major depressive disorder (HCC) - CMP14+EGFR  11. Controlled substance agreement signed  - CMP14+EGFR  12. Constipation, unspecified constipation type - CMP14+EGFR  13. Anxiety - CMP14+EGFR  14. Obesity (BMI 30-39.9)  - CMP14+EGFR  15. Vitamin B 12 deficiency - CMP14+EGFR   Labs pending Keep follow up with Hematologists  Health Maintenance reviewed Diet and exercise encouraged  Follow up plan: 3 months    Evelina Dun, FNP

## 2018-12-18 NOTE — Patient Instructions (Signed)
Health Maintenance After Age 75 After age 75, you are at a higher risk for certain long-term diseases and infections as well as injuries from falls. Falls are a major cause of broken bones and head injuries in people who are older than age 75. Getting regular preventive care can help to keep you healthy and well. Preventive care includes getting regular testing and making lifestyle changes as recommended by your health care provider. Talk with your health care provider about:  Which screenings and tests you should have. A screening is a test that checks for a disease when you have no symptoms.  A diet and exercise plan that is right for you. What should I know about screenings and tests to prevent falls? Screening and testing are the best ways to find a health problem early. Early diagnosis and treatment give you the best chance of managing medical conditions that are common after age 75. Certain conditions and lifestyle choices may make you more likely to have a fall. Your health care provider may recommend:  Regular vision checks. Poor vision and conditions such as cataracts can make you more likely to have a fall. If you wear glasses, make sure to get your prescription updated if your vision changes.  Medicine review. Work with your health care provider to regularly review all of the medicines you are taking, including over-the-counter medicines. Ask your health care provider about any side effects that may make you more likely to have a fall. Tell your health care provider if any medicines that you take make you feel dizzy or sleepy.  Osteoporosis screening. Osteoporosis is a condition that causes the bones to get weaker. This can make the bones weak and cause them to break more easily.  Blood pressure screening. Blood pressure changes and medicines to control blood pressure can make you feel dizzy.  Strength and balance checks. Your health care provider may recommend certain tests to check your  strength and balance while standing, walking, or changing positions.  Foot health exam. Foot pain and numbness, as well as not wearing proper footwear, can make you more likely to have a fall.  Depression screening. You may be more likely to have a fall if you have a fear of falling, feel emotionally low, or feel unable to do activities that you used to do.  Alcohol use screening. Using too much alcohol can affect your balance and may make you more likely to have a fall. What actions can I take to lower my risk of falls? General instructions  Talk with your health care provider about your risks for falling. Tell your health care provider if: ? You fall. Be sure to tell your health care provider about all falls, even ones that seem minor. ? You feel dizzy, sleepy, or off-balance.  Take over-the-counter and prescription medicines only as told by your health care provider. These include any supplements.  Eat a healthy diet and maintain a healthy weight. A healthy diet includes low-fat dairy products, low-fat (lean) meats, and fiber from whole grains, beans, and lots of fruits and vegetables. Home safety  Remove any tripping hazards, such as rugs, cords, and clutter.  Install safety equipment such as grab bars in bathrooms and safety rails on stairs.  Keep rooms and walkways well-lit. Activity   Follow a regular exercise program to stay fit. This will help you maintain your balance. Ask your health care provider what types of exercise are appropriate for you.  If you need a cane or   walker, use it as recommended by your health care provider.  Wear supportive shoes that have nonskid soles. Lifestyle  Do not drink alcohol if your health care provider tells you not to drink.  If you drink alcohol, limit how much you have: ? 0-1 drink a day for women. ? 0-2 drinks a day for men.  Be aware of how much alcohol is in your drink. In the [place] one drink equals one typical bottle of beer (12  oz), one-half glass of wine (5 oz), or one shot of hard liquor (1 oz).  Do not use any products that contain nicotine or tobacco, such as cigarettes and e-cigarettes. If you need help quitting, ask your health care provider. Summary  Having a healthy lifestyle and getting preventive care can help to protect your health and wellness after age 75.  Screening and testing are the best way to find a health problem early and help you avoid having a fall. Early diagnosis and treatment give you the best chance for managing medical conditions that are more common for people who are older than age 75.  Falls are a major cause of broken bones and head injuries in people who are older than age 75. Take precautions to prevent a fall at home.  Work with your health care provider to learn what changes you can make to improve your health and wellness and to prevent falls. This information is not intended to replace advice given to you by your health care provider. Make sure you discuss any questions you have with your health care provider. Document Released: 12/28/2016 Document Revised: 06/07/2018 Document Reviewed: 12/28/2016 Elsevier Patient Education  2020 [employer]  

## 2018-12-19 LAB — CMP14+EGFR
ALT: 8 IU/L (ref 0–32)
AST: 15 IU/L (ref 0–40)
Albumin/Globulin Ratio: 1.8 (ref 1.2–2.2)
Albumin: 4.1 g/dL (ref 3.7–4.7)
Alkaline Phosphatase: 50 IU/L (ref 39–117)
BUN/Creatinine Ratio: 12 (ref 12–28)
BUN: 13 mg/dL (ref 8–27)
Bilirubin Total: 0.3 mg/dL (ref 0.0–1.2)
CO2: 29 mmol/L (ref 20–29)
Calcium: 9.5 mg/dL (ref 8.7–10.3)
Chloride: 101 mmol/L (ref 96–106)
Creatinine, Ser: 1.13 mg/dL — ABNORMAL HIGH (ref 0.57–1.00)
GFR calc Af Amer: 55 mL/min/{1.73_m2} — ABNORMAL LOW (ref >59)
GFR calc non Af Amer: 48 mL/min/{1.73_m2} — ABNORMAL LOW (ref >59)
Globulin, Total: 2.3 g/dL (ref 1.5–4.5)
Glucose: 111 mg/dL — ABNORMAL HIGH (ref 65–99)
Potassium: 4.3 mmol/L (ref 3.5–5.2)
Sodium: 141 mmol/L (ref 134–144)
Total Protein: 6.4 g/dL (ref 6.0–8.5)

## 2018-12-19 LAB — TSH: TSH: 2.72 u[IU]/mL (ref 0.450–4.500)

## 2018-12-19 LAB — MICROALBUMIN / CREATININE URINE RATIO
Creatinine, Urine: 48.3 mg/dL
Microalb/Creat Ratio: <6 mg/g creat (ref 0–29)
Microalbumin, Urine: <3 ug/mL

## 2018-12-20 ENCOUNTER — Telehealth: Payer: Self-pay | Admitting: Family

## 2018-12-20 NOTE — Telephone Encounter (Signed)
Patient aware and verbalized understanding. °

## 2019-02-08 ENCOUNTER — Other Ambulatory Visit: Payer: Self-pay | Admitting: Family Medicine

## 2019-02-08 DIAGNOSIS — M25552 Pain in left hip: Secondary | ICD-10-CM

## 2019-02-08 DIAGNOSIS — K2100 Gastro-esophageal reflux disease with esophagitis, without bleeding: Secondary | ICD-10-CM

## 2019-02-08 DIAGNOSIS — M25551 Pain in right hip: Secondary | ICD-10-CM

## 2019-02-08 MED ORDER — MONTELUKAST SODIUM 10 MG PO TABS: 10.0000 mg | ORAL_TABLET | Freq: Every day | ORAL | 0 refills | Status: DC

## 2019-02-08 MED ORDER — NITROGLYCERIN 0.4 MG SL SUBL: SUBLINGUAL_TABLET | SUBLINGUAL | 1 refills | Status: DC

## 2019-02-08 MED ORDER — NAPROXEN 500 MG PO TABS: 500.0000 mg | ORAL_TABLET | Freq: Two times a day (BID) | ORAL | 0 refills | Status: DC

## 2019-02-08 MED ORDER — OMEPRAZOLE 20 MG PO CPDR: DELAYED_RELEASE_CAPSULE | ORAL | 0 refills | Status: DC

## 2019-02-13 ENCOUNTER — Other Ambulatory Visit: Payer: Self-pay | Admitting: *Deleted

## 2019-02-13 MED ORDER — ICOSAPENT ETHYL 1 G PO CAPS: ORAL_CAPSULE | ORAL | 0 refills | Status: DC

## 2019-02-18 ENCOUNTER — Telehealth: Payer: Self-pay | Admitting: Family

## 2019-02-18 NOTE — Telephone Encounter (Signed)
Left message to call back  

## 2019-02-18 NOTE — Telephone Encounter (Signed)
Patient has already been to urgent care to be seen.

## 2019-02-25 ENCOUNTER — Other Ambulatory Visit: Payer: Self-pay | Admitting: *Deleted

## 2019-02-25 DIAGNOSIS — M1990 Unspecified osteoarthritis, unspecified site: Secondary | ICD-10-CM

## 2019-02-26 ENCOUNTER — Other Ambulatory Visit: Payer: Self-pay | Admitting: *Deleted

## 2019-02-26 DIAGNOSIS — F1320 Sedative, hypnotic or anxiolytic dependence, uncomplicated: Secondary | ICD-10-CM

## 2019-02-26 DIAGNOSIS — F419 Anxiety disorder, unspecified: Secondary | ICD-10-CM

## 2019-03-05 ENCOUNTER — Other Ambulatory Visit: Payer: Self-pay | Admitting: Family

## 2019-03-05 DIAGNOSIS — E1142 Type 2 diabetes mellitus with diabetic polyneuropathy: Secondary | ICD-10-CM

## 2019-03-11 ENCOUNTER — Other Ambulatory Visit: Payer: Self-pay | Admitting: Family

## 2019-03-11 DIAGNOSIS — R21 Rash and other nonspecific skin eruption: Secondary | ICD-10-CM | POA: Diagnosis not present

## 2019-03-11 DIAGNOSIS — J069 Acute upper respiratory infection, unspecified: Secondary | ICD-10-CM

## 2019-03-11 DIAGNOSIS — J309 Allergic rhinitis, unspecified: Secondary | ICD-10-CM

## 2019-03-11 DIAGNOSIS — J4520 Mild intermittent asthma, uncomplicated: Secondary | ICD-10-CM

## 2019-03-11 DIAGNOSIS — K5900 Constipation, unspecified: Secondary | ICD-10-CM

## 2019-03-11 DIAGNOSIS — D649 Anemia, unspecified: Secondary | ICD-10-CM | POA: Diagnosis not present

## 2019-03-13 ENCOUNTER — Telehealth: Payer: Self-pay | Admitting: *Deleted

## 2019-03-13 MED ORDER — FLOVENT HFA 110 MCG/ACT IN AERO: 2.0000 | INHALATION_SPRAY | Freq: Two times a day (BID) | RESPIRATORY_TRACT | 12 refills | Status: DC

## 2019-03-13 NOTE — Addendum Note (Signed)
Addended by: Evelina Dun A on: 03/13/2019 02:03 PM   Modules accepted: Orders

## 2019-03-13 NOTE — Telephone Encounter (Signed)
QVAR changed to Flovent HFA per insurance.

## 2019-03-13 NOTE — Telephone Encounter (Signed)
Fax from [facility] 40 mcg Not covered by insurance Rx benefit plan alternative Flovent HFA or Arnuity Please advise

## 2019-03-13 NOTE — Telephone Encounter (Signed)
Patient aware.

## 2019-03-15 ENCOUNTER — Other Ambulatory Visit: Payer: Self-pay

## 2019-03-15 ENCOUNTER — Inpatient Hospital Stay (HOSPITAL_COMMUNITY): Payer: Medicare Other | Attending: Hematology

## 2019-03-15 DIAGNOSIS — E538 Deficiency of other specified B group vitamins: Secondary | ICD-10-CM | POA: Diagnosis not present

## 2019-03-15 DIAGNOSIS — D509 Iron deficiency anemia, unspecified: Secondary | ICD-10-CM | POA: Insufficient documentation

## 2019-03-15 LAB — CBC WITH DIFFERENTIAL/PLATELET
Abs Immature Granulocytes: 0.02 10*3/uL (ref 0.00–0.07)
Basophils Absolute: 0.1 10*3/uL (ref 0.0–0.1)
Basophils Relative: 1 %
Eosinophils Absolute: 0.4 10*3/uL (ref 0.0–0.5)
Eosinophils Relative: 5 %
HCT: 40.2 % (ref 36.0–46.0)
Hemoglobin: 12.9 g/dL (ref 12.0–15.0)
Immature Granulocytes: 0 %
Lymphocytes Relative: 35 %
Lymphs Abs: 2.7 10*3/uL (ref 0.7–4.0)
MCH: 30.1 pg (ref 26.0–34.0)
MCHC: 32.1 g/dL (ref 30.0–36.0)
MCV: 93.9 fL (ref 80.0–100.0)
Monocytes Absolute: 0.6 10*3/uL (ref 0.1–1.0)
Monocytes Relative: 8 %
Neutro Abs: 3.9 10*3/uL (ref 1.7–7.7)
Neutrophils Relative %: 51 %
Platelets: 198 10*3/uL (ref 150–400)
RBC: 4.28 MIL/uL (ref 3.87–5.11)
RDW: 13.1 % (ref 11.5–15.5)
WBC: 7.6 10*3/uL (ref 4.0–10.5)
nRBC: 0 % (ref 0.0–0.2)

## 2019-03-15 LAB — COMPREHENSIVE METABOLIC PANEL
ALT: 15 U/L (ref 0–44)
AST: 17 U/L (ref 15–41)
Albumin: 3.3 g/dL — ABNORMAL LOW (ref 3.5–5.0)
Alkaline Phosphatase: 37 U/L — ABNORMAL LOW (ref 38–126)
Anion gap: 7 (ref 5–15)
BUN: 20 mg/dL (ref 8–23)
CO2: 29 mmol/L (ref 22–32)
Calcium: 9.1 mg/dL (ref 8.9–10.3)
Chloride: 102 mmol/L (ref 98–111)
Creatinine, Ser: 1.18 mg/dL — ABNORMAL HIGH (ref 0.44–1.00)
GFR calc Af Amer: 52 mL/min — ABNORMAL LOW (ref >60)
GFR calc non Af Amer: 45 mL/min — ABNORMAL LOW (ref >60)
Glucose, Bld: 108 mg/dL — ABNORMAL HIGH (ref 70–99)
Potassium: 4.1 mmol/L (ref 3.5–5.1)
Sodium: 138 mmol/L (ref 135–145)
Total Bilirubin: 0.7 mg/dL (ref 0.3–1.2)
Total Protein: 6.7 g/dL (ref 6.5–8.1)

## 2019-03-15 LAB — IRON AND TIBC
Iron: 61 ug/dL (ref 28–170)
Saturation Ratios: 20 % (ref 10.4–31.8)
TIBC: 306 ug/dL (ref 250–450)
UIBC: 245 ug/dL

## 2019-03-15 LAB — LACTATE DEHYDROGENASE: LDH: 158 U/L (ref 98–192)

## 2019-03-15 LAB — FERRITIN: Ferritin: 138 ng/mL (ref 11–307)

## 2019-03-15 LAB — VITAMIN B12: Vitamin B-12: 2780 pg/mL — ABNORMAL HIGH (ref 180–914)

## 2019-03-15 LAB — FOLATE: Folate: 17.4 ng/mL (ref >5.9)

## 2019-03-15 LAB — VITAMIN D 25 HYDROXY (VIT D DEFICIENCY, FRACTURES): Vit D, 25-Hydroxy: 44.71 ng/mL (ref 30–100)

## 2019-03-19 ENCOUNTER — Ambulatory Visit (INDEPENDENT_AMBULATORY_CARE_PROVIDER_SITE_OTHER): Payer: Medicare Other | Admitting: Family

## 2019-03-19 ENCOUNTER — Other Ambulatory Visit: Payer: Self-pay

## 2019-03-19 ENCOUNTER — Encounter: Payer: Self-pay | Admitting: Family

## 2019-03-19 VITALS — BP 117/73 | HR 67 | Temp 97.1°F | Ht 64.5 in | Wt 192.4 lb

## 2019-03-19 DIAGNOSIS — I2510 Atherosclerotic heart disease of native coronary artery without angina pectoris: Secondary | ICD-10-CM

## 2019-03-19 DIAGNOSIS — N3281 Overactive bladder: Secondary | ICD-10-CM

## 2019-03-19 DIAGNOSIS — E1149 Type 2 diabetes mellitus with other diabetic neurological complication: Secondary | ICD-10-CM | POA: Diagnosis not present

## 2019-03-19 DIAGNOSIS — K5900 Constipation, unspecified: Secondary | ICD-10-CM | POA: Diagnosis not present

## 2019-03-19 DIAGNOSIS — Z79899 Other long term (current) drug therapy: Secondary | ICD-10-CM

## 2019-03-19 DIAGNOSIS — J45909 Unspecified asthma, uncomplicated: Secondary | ICD-10-CM

## 2019-03-19 DIAGNOSIS — E785 Hyperlipidemia, unspecified: Secondary | ICD-10-CM

## 2019-03-19 DIAGNOSIS — E1159 Type 2 diabetes mellitus with other circulatory complications: Secondary | ICD-10-CM

## 2019-03-19 DIAGNOSIS — F419 Anxiety disorder, unspecified: Secondary | ICD-10-CM

## 2019-03-19 DIAGNOSIS — I10 Essential (primary) hypertension: Secondary | ICD-10-CM

## 2019-03-19 DIAGNOSIS — E1142 Type 2 diabetes mellitus with diabetic polyneuropathy: Secondary | ICD-10-CM

## 2019-03-19 DIAGNOSIS — F329 Major depressive disorder, single episode, unspecified: Secondary | ICD-10-CM | POA: Diagnosis not present

## 2019-03-19 DIAGNOSIS — E538 Deficiency of other specified B group vitamins: Secondary | ICD-10-CM

## 2019-03-19 DIAGNOSIS — F1320 Sedative, hypnotic or anxiolytic dependence, uncomplicated: Secondary | ICD-10-CM

## 2019-03-19 DIAGNOSIS — E039 Hypothyroidism, unspecified: Secondary | ICD-10-CM

## 2019-03-19 DIAGNOSIS — E1169 Type 2 diabetes mellitus with other specified complication: Secondary | ICD-10-CM

## 2019-03-19 DIAGNOSIS — J449 Chronic obstructive pulmonary disease, unspecified: Secondary | ICD-10-CM

## 2019-03-19 DIAGNOSIS — K219 Gastro-esophageal reflux disease without esophagitis: Secondary | ICD-10-CM

## 2019-03-19 DIAGNOSIS — F32A Depression, unspecified: Secondary | ICD-10-CM

## 2019-03-19 DIAGNOSIS — D509 Iron deficiency anemia, unspecified: Secondary | ICD-10-CM

## 2019-03-19 DIAGNOSIS — E669 Obesity, unspecified: Secondary | ICD-10-CM

## 2019-03-19 DIAGNOSIS — I152 Hypertension secondary to endocrine disorders: Secondary | ICD-10-CM

## 2019-03-19 DIAGNOSIS — R6889 Other general symptoms and signs: Secondary | ICD-10-CM | POA: Diagnosis not present

## 2019-03-19 DIAGNOSIS — K2100 Gastro-esophageal reflux disease with esophagitis, without bleeding: Secondary | ICD-10-CM

## 2019-03-19 DIAGNOSIS — M8580 Other specified disorders of bone density and structure, unspecified site: Secondary | ICD-10-CM

## 2019-03-19 LAB — BAYER DCA HB A1C WAIVED: HB A1C (BAYER DCA - WAIVED): 6.6 % (ref <7.0)

## 2019-03-19 MED ORDER — GABAPENTIN 300 MG PO CAPS: 300.0000 mg | ORAL_CAPSULE | Freq: Three times a day (TID) | ORAL | 1 refills | Status: DC

## 2019-03-19 MED ORDER — SITAGLIPTIN PHOSPHATE 100 MG PO TABS: ORAL_TABLET | ORAL | 1 refills | Status: DC

## 2019-03-19 MED ORDER — MIRABEGRON ER 25 MG PO TB24: 25.0000 mg | ORAL_TABLET | Freq: Every day | ORAL | 1 refills | Status: DC

## 2019-03-19 MED ORDER — MISOPROSTOL 200 MCG PO TABS: 200.0000 ug | ORAL_TABLET | Freq: Four times a day (QID) | ORAL | 2 refills | Status: DC

## 2019-03-19 MED ORDER — METFORMIN HCL ER 500 MG PO TB24: ORAL_TABLET | ORAL | 1 refills | Status: DC

## 2019-03-19 MED ORDER — RALOXIFENE HCL 60 MG PO TABS: ORAL_TABLET | ORAL | 1 refills | Status: DC

## 2019-03-19 MED ORDER — METOPROLOL TARTRATE 25 MG PO TABS: 25.0000 mg | ORAL_TABLET | Freq: Two times a day (BID) | ORAL | 1 refills | Status: DC

## 2019-03-19 MED ORDER — OMEPRAZOLE 20 MG PO CPDR: DELAYED_RELEASE_CAPSULE | ORAL | 1 refills | Status: DC

## 2019-03-19 MED ORDER — DIAZEPAM 5 MG PO TABS: 5.0000 mg | ORAL_TABLET | Freq: Two times a day (BID) | ORAL | 2 refills | Status: DC

## 2019-03-19 MED ORDER — AMLODIPINE BESYLATE 10 MG PO TABS: ORAL_TABLET | ORAL | 1 refills | Status: DC

## 2019-03-19 MED ORDER — ESCITALOPRAM OXALATE 10 MG PO TABS: ORAL_TABLET | ORAL | 1 refills | Status: DC

## 2019-03-19 MED ORDER — HYDROXYZINE PAMOATE 25 MG PO CAPS: 25.0000 mg | ORAL_CAPSULE | Freq: Three times a day (TID) | ORAL | 1 refills | Status: DC | PRN

## 2019-03-19 NOTE — Patient Instructions (Signed)

## 2019-03-19 NOTE — Progress Notes (Signed)
Subjective:    Patient ID: Denise Gardner, female    DOB: 1946-01-06, 74 y.o.   MRN: [id number]  Chief Complaint  Patient presents with  . Diabetes    3 month follow up  . Hypertension   Pt presents to the office today for chronic follow up.She is followed byCardiologists annually for CAD. She is followed by Hematologists every 3 months for iron deficiency anemia and Vit B 12 deficiency. Diabetes She presents for her follow-up diabetic visit. She has type 2 diabetes mellitus. Her disease course has been stable. Hypoglycemia symptoms include nervousness/anxiousness. Associated symptoms include blurred vision ("some times"), fatigue and foot paresthesias. There are no hypoglycemic complications. Symptoms are stable. Diabetic complications include heart disease. Pertinent negatives for diabetic complications include no CVA. Risk factors for coronary artery disease include dyslipidemia, diabetes mellitus, hypertension, sedentary lifestyle and post-menopausal. Her weight is stable. She is following a generally healthy diet. Her overall blood glucose range is 110-130 mg/dl. An ACE inhibitor/angiotensin II receptor blocker is not being taken.  Hypertension This is a chronic problem. The current episode started more than 1 year ago. The problem has been waxing and waning since onset. The problem is uncontrolled. Associated symptoms include anxiety, blurred vision ("some times"), malaise/fatigue and peripheral edema ([place]). Pertinent negatives include no shortness of breath. Risk factors for coronary artery disease include dyslipidemia, diabetes mellitus, obesity and sedentary lifestyle. The current treatment provides moderate improvement. Hypertensive end-organ damage includes CAD/MI. There is no history of kidney disease, CVA or heart failure. Identifiable causes of hypertension include a thyroid problem.  Thyroid Problem Presents for follow-up visit. Symptoms include anxiety, constipation, depressed  mood and fatigue. Patient reports no diaphoresis or diarrhea. Her past medical history is significant for hyperlipidemia. There is no history of heart failure.  Anxiety Presents for follow-up visit. Symptoms include depressed mood, excessive worry, nervous/anxious behavior and restlessness. Patient reports no insomnia, irritability or shortness of breath. Symptoms occur most days.   Her past medical history is significant for anemia.  Anemia Presents for follow-up visit. Symptoms include malaise/fatigue. There is no history of heart failure.  Constipation This is a chronic problem. The current episode started more than 1 year ago. The problem has been waxing and waning since onset. Her stool frequency is 4 to 5 times per week. Pertinent negatives include no diarrhea. Risk factors include obesity. She has tried laxatives for the symptoms. The treatment provided moderate relief.  Hyperlipidemia This is a chronic problem. The current episode started more than 1 year ago. The problem is controlled. Exacerbating diseases include obesity. Pertinent negatives include no shortness of breath. Current antihyperlipidemic treatment includes statins. The current treatment provides moderate improvement of lipids.  Urinary Frequency  This is a chronic problem. The current episode started more than 1 year ago. The patient is experiencing no pain. There has been no fever. Associated symptoms include frequency. Pertinent negatives include no flank pain, hematuria, hesitancy or urgency. The treatment provided mild relief.  Rash This is a recurrent problem. The current episode started more than 1 month ago. The problem has been waxing and waning since onset. The affected locations include the chest, face, abdomen and right buttock. The rash is characterized by itchiness and redness. Associated symptoms include fatigue. Pertinent negatives include no diarrhea or shortness of breath. Past treatments include antibiotics (Pt  currently taking Keflex and area is improving). The treatment provided moderate relief.       Review of Systems  Constitutional: Positive for fatigue and malaise/fatigue.  Negative for diaphoresis and irritability.  Eyes: Positive for blurred vision ("some times").  Respiratory: Negative for shortness of breath.   Gastrointestinal: Positive for constipation. Negative for diarrhea.  Genitourinary: Positive for frequency. Negative for flank pain, hematuria, hesitancy and urgency.  Skin: Positive for rash.  Psychiatric/Behavioral: The patient is nervous/anxious. The patient does not have insomnia.   All other systems reviewed and are negative.      Objective:   Physical Exam Vitals reviewed.  Constitutional:      General: She is not in acute distress.    Appearance: She is well-developed. She is obese.  HENT:     Head: Normocephalic and atraumatic.     Right Ear: Tympanic membrane normal.     Left Ear: Tympanic membrane normal.  Eyes:     Pupils: Pupils are equal, round, and reactive to light.  Neck:     Thyroid: No thyromegaly.  Cardiovascular:     Rate and Rhythm: Normal rate and regular rhythm.     Heart sounds: Normal heart sounds. No murmur.  Pulmonary:     Effort: Pulmonary effort is normal. No respiratory distress.     Breath sounds: Normal breath sounds. No wheezing.  Abdominal:     General: Bowel sounds are normal. There is no distension.     Palpations: Abdomen is soft.     Tenderness: There is no abdominal tenderness.  Musculoskeletal:        General: No tenderness. Normal range of motion.     Cervical back: Normal range of motion and neck supple.  Skin:    General: Skin is warm and dry.  Neurological:     Mental Status: She is alert and oriented to person, place, and time.     Cranial Nerves: No cranial nerve deficit.     Deep Tendon Reflexes: Reflexes are normal and symmetric.  Psychiatric:        Behavior: Behavior normal.        Thought Content: Thought  content normal.        Judgment: Judgment normal.       BP (!) 155/86   Pulse 65   Temp (!) 97.1 F (36.2 C) (Temporal)   Ht 5' 4.5" (1.638 m)   Wt 192 lb 6.4 oz (87.3 kg)   SpO2 98%   BMI 32.52 kg/m      Assessment & Plan:  Denise Gardner comes in today with chief complaint of Diabetes (3 month follow up) and Hypertension   Diagnosis and orders addressed:  1. Type 2 diabetes mellitus with diabetic polyneuropathy, without long-term current use of insulin (HCC) - Bayer DCA Hb A1c Waived - sitaGLIPtin (JANUVIA) 100 MG tablet; TAKE 1 TABLET BY MOUTH EVERY DAY. DISCONTINUE GLIPIZIDE.  Dispense: 90 tablet; Refill: 1 - metFORMIN (GLUCOPHAGE-XR) 500 MG 24 hr tablet; TAKE 2 TABLETS(1000 MG) BY MOUTH DAILY WITH BREAKFAST  Dispense: 180 tablet; Refill: 1 - CMP14+EGFR  2. Anxiety - escitalopram (LEXAPRO) 10 MG tablet; TAKE 1 TABLET(10 MG) BY MOUTH DAILY  Dispense: 90 tablet; Refill: 1 - gabapentin (NEURONTIN) 300 MG capsule; Take 1 capsule (300 mg total) by mouth 3 (three) times daily.  Dispense: 270 capsule; Refill: 1 - CMP14+EGFR - diazepam (VALIUM) 5 MG tablet; Take 1 tablet (5 mg total) by mouth 2 (two) times daily. Pt takes one tablet at daytime and one tablet at bedtime  Dispense: 45 tablet; Refill: 2  3. Depression - escitalopram (LEXAPRO) 10 MG tablet; TAKE 1 TABLET(10 MG) BY MOUTH DAILY  Dispense: 90 tablet; Refill: 1 - gabapentin (NEURONTIN) 300 MG capsule; Take 1 capsule (300 mg total) by mouth 3 (three) times daily.  Dispense: 270 capsule; Refill: 1 - CMP14+EGFR  4. Constipation, unspecified constipation type - misoprostol (CYTOTEC) 200 MCG tablet; Take 1 tablet (200 mcg total) by mouth 4 (four) times daily.  Dispense: 120 tablet; Refill: 2 - CMP14+EGFR  5. Diabetes mellitus type 2 with neurological manifestations ([place]) - sitaGLIPtin (JANUVIA) 100 MG tablet; TAKE 1 TABLET BY MOUTH EVERY DAY. DISCONTINUE GLIPIZIDE.  Dispense: 90 tablet; Refill: 1 - CMP14+EGFR  6.  Gastroesophageal reflux disease with esophagitis - omeprazole (PRILOSEC) 20 MG capsule; TAKE 1 CAPSULE(20 MG) BY MOUTH TWICE DAILY BEFORE A MEAL  Dispense: 180 capsule; Refill: 1 - CMP14+EGFR  7. Hypertension associated with diabetes ([place]) - amLODipine (NORVASC) 10 MG tablet; 1 tablet daily  Dispense: 90 tablet; Refill: 1 - metoprolol tartrate (LOPRESSOR) 25 MG tablet; Take 1 tablet (25 mg total) by mouth 2 (two) times daily.  Dispense: 180 tablet; Refill: 1 - CMP14+EGFR  8. Osteopenia - raloxifene (EVISTA) 60 MG tablet; TAKE 1 TABLET(60 MG) BY MOUTH DAILY  Dispense: 90 tablet; Refill: 1 - CMP14+EGFR  9. Chronic coronary artery disease - CMP14+EGFR  10. Uncomplicated asthma, unspecified asthma severity, unspecified whether persistent - CMP14+EGFR  11. Chronic obstructive pulmonary disease, unspecified COPD type ([place])  - CMP14+EGFR  12. Gastroesophageal reflux disease, unspecified whether esophagitis present - CMP14+EGFR  13. Hyperlipidemia associated with type 2 diabetes mellitus (HCC)  - CMP14+EGFR - Lipid panel  14. Hypothyroidism, unspecified type - CMP14+EGFR  15. Obesity (BMI 30-39.9)  - CMP14+EGFR  16. Iron deficiency anemia, unspecified iron deficiency anemia type  - CMP14+EGFR - Anemia Profile B  17. Vitamin B 12 deficiency  - CMP14+EGFR - Anemia Profile B  18. Controlled substance agreement signed  - CMP14+EGFR  19. Hyperlipidemia, unspecified hyperlipidemia type  20. Benzodiazepine dependence (HCC) - diazepam (VALIUM) 5 MG tablet; Take 1 tablet (5 mg total) by mouth 2 (two) times daily. Pt takes one tablet at daytime and one tablet at bedtime  Dispense: 45 tablet; Refill: 2  21. Overactive bladder Myrbetriq 25 mg started today   Labs pending Pt reviewed in [place] controlled database- No red flags noted, will decrease valium to 45 from 60 a month. Will try to stop medication in near future.  Health Maintenance reviewed Diet and exercise  encouraged  Follow up plan: 3 months     Evelina Dun, FNP

## 2019-03-20 LAB — CMP14+EGFR
ALT: 14 IU/L (ref 0–32)
AST: 22 IU/L (ref 0–40)
Albumin/Globulin Ratio: 1.6 (ref 1.2–2.2)
Albumin: 4.2 g/dL (ref 3.7–4.7)
Alkaline Phosphatase: 46 IU/L (ref 39–117)
BUN/Creatinine Ratio: 15 (ref 12–28)
BUN: 17 mg/dL (ref 8–27)
Bilirubin Total: 0.3 mg/dL (ref 0.0–1.2)
CO2: 25 mmol/L (ref 20–29)
Calcium: 10.5 mg/dL — ABNORMAL HIGH (ref 8.7–10.3)
Chloride: 100 mmol/L (ref 96–106)
Creatinine, Ser: 1.17 mg/dL — ABNORMAL HIGH (ref 0.57–1.00)
GFR calc Af Amer: 53 mL/min/{1.73_m2} — ABNORMAL LOW (ref >59)
GFR calc non Af Amer: 46 mL/min/{1.73_m2} — ABNORMAL LOW (ref >59)
Globulin, Total: 2.6 g/dL (ref 1.5–4.5)
Glucose: 125 mg/dL — ABNORMAL HIGH (ref 65–99)
Potassium: 4.5 mmol/L (ref 3.5–5.2)
Sodium: 139 mmol/L (ref 134–144)
Total Protein: 6.8 g/dL (ref 6.0–8.5)

## 2019-03-20 LAB — ANEMIA PROFILE B
Basophils Absolute: 0.1 10*3/uL (ref 0.0–0.2)
Basos: 2 %
EOS (ABSOLUTE): 0.4 10*3/uL (ref 0.0–0.4)
Eos: 7 %
Ferritin: 292 ng/mL — ABNORMAL HIGH (ref 15–150)
Folate: 15.9 ng/mL (ref >3.0)
Hematocrit: 42.3 % (ref 34.0–46.6)
Hemoglobin: 14.2 g/dL (ref 11.1–15.9)
Immature Grans (Abs): 0 10*3/uL (ref 0.0–0.1)
Immature Granulocytes: 0 %
Iron Saturation: 38 % (ref 15–55)
Iron: 104 ug/dL (ref 27–139)
Lymphocytes Absolute: 2.7 10*3/uL (ref 0.7–3.1)
Lymphs: 42 %
MCH: 30.3 pg (ref 26.6–33.0)
MCHC: 33.6 g/dL (ref 31.5–35.7)
MCV: 90 fL (ref 79–97)
Monocytes Absolute: 0.5 10*3/uL (ref 0.1–0.9)
Monocytes: 7 %
Neutrophils Absolute: 2.8 10*3/uL (ref 1.4–7.0)
Neutrophils: 42 %
Platelets: 225 10*3/uL (ref 150–450)
RBC: 4.69 x10E6/uL (ref 3.77–5.28)
RDW: 12.8 % (ref 11.7–15.4)
Retic Ct Pct: 1.1 % (ref 0.6–2.6)
Total Iron Binding Capacity: 276 ug/dL (ref 250–450)
UIBC: 172 ug/dL (ref 118–369)
Vitamin B-12: >2000 pg/mL — ABNORMAL HIGH (ref 232–1245)
WBC: 6.6 10*3/uL (ref 3.4–10.8)

## 2019-03-20 LAB — LIPID PANEL
Chol/HDL Ratio: 4.6 ratio — ABNORMAL HIGH (ref 0.0–4.4)
Cholesterol, Total: 267 mg/dL — ABNORMAL HIGH (ref 100–199)
HDL: 58 mg/dL (ref >39)
LDL Chol Calc (NIH): 189 mg/dL — ABNORMAL HIGH (ref 0–99)
Triglycerides: 112 mg/dL (ref 0–149)
VLDL Cholesterol Cal: 20 mg/dL (ref 5–40)

## 2019-03-20 LAB — TSH: TSH: 8 u[IU]/mL — ABNORMAL HIGH (ref 0.450–4.500)

## 2019-03-21 ENCOUNTER — Ambulatory Visit: Payer: Medicare Other | Admitting: Family

## 2019-03-22 ENCOUNTER — Inpatient Hospital Stay (HOSPITAL_COMMUNITY): Payer: Medicare Other | Admitting: Nurse Practitioner

## 2019-03-22 ENCOUNTER — Other Ambulatory Visit: Payer: Self-pay | Admitting: Family

## 2019-03-22 ENCOUNTER — Other Ambulatory Visit: Payer: Self-pay

## 2019-03-22 DIAGNOSIS — D509 Iron deficiency anemia, unspecified: Secondary | ICD-10-CM

## 2019-03-22 DIAGNOSIS — E538 Deficiency of other specified B group vitamins: Secondary | ICD-10-CM | POA: Diagnosis not present

## 2019-03-22 MED ORDER — LEVOTHYROXINE SODIUM 88 MCG PO TABS: 88.0000 ug | ORAL_TABLET | Freq: Every day | ORAL | 11 refills | Status: DC

## 2019-03-22 NOTE — Progress Notes (Signed)
Denise Gardner, [place] [address]   CLINIC:  Medical Oncology/Hematology  PCP:  Sharion Balloon, [place] [address] [phone]   REASON FOR VISIT: Follow-up for deficiency anemia  CURRENT THERAPY: Iron supplements     INTERVAL HISTORY:  Denise Gardner 74 y.o. female returns for routine follow-up for iron deficiency anemia.  Patient reports she has been doing well since her last visit.  She denies any bright red bleeding per rectum or melena.  She denies any easy bruising or bleeding. Denies any nausea, vomiting, or diarrhea. Denies any new pains. Had not noticed any recent bleeding such as epistaxis, hematuria or hematochezia. Denies recent chest pain on exertion, shortness of breath on minimal exertion, pre-syncopal episodes, or palpitations. Denies any numbness or tingling in hands or feet. Denies any recent fevers, infections, or recent hospitalizations. Patient reports appetite at 100% and energy level at 25%.  She is eating well maintain her weight at this time.    REVIEW OF SYSTEMS:  Review of Systems  Cardiovascular: Positive for leg swelling.  Gastrointestinal: Positive for constipation.  Neurological: Positive for numbness.  All other systems reviewed and are negative.    PAST MEDICAL/SURGICAL HISTORY:  Past Medical History:  Diagnosis Date  . Anxiety    depression  . Asthma   . Cataract   . Chronic back pain   . Coronary artery disease    Taxus stent to RCA 2008 2.5 x 16, non obstructive 15% proximal right coronary artery, patent curcumflex LAD, preserved LV  . Diabetes mellitus without complication ([place])   . GERD (gastroesophageal reflux disease)   . Hyperlipidemia   . Hypertension   . Insomnia   . Migraine   . Neuropathy   . Shingles   . Thyroid disease   . Ulcer, stomach peptic    Past Surgical History:  Procedure Laterality Date  . TOTAL ABDOMINAL HYSTERECTOMY       SOCIAL HISTORY:    Social History   Socioeconomic History  . Marital status: Married    Spouse name: Not on file  . Number of children: Not on file  . Years of education: Not on file  . Highest education level: Not on file  Occupational History  . Occupation: retired    Comment: Equities trader   Tobacco Use  . Smoking status: Never Smoker  . Smokeless tobacco: Never Used  Substance and Sexual Activity  . Alcohol use: No  . Drug use: No  . Sexual activity: Not Currently  Other Topics Concern  . Not on file  Social History Narrative   Has a husband but has not lived with him for 24 years.  No children.     Social Determinants of Health   Financial Resource Strain:   . Difficulty of Paying Living Expenses: Not on file  Food Insecurity:   . Worried About Charity fundraiser in the Last Year: Not on file  . Ran Out of Food in the Last Year: Not on file  Transportation Needs:   . Lack of Transportation (Medical): Not on file  . Lack of Transportation (Non-Medical): Not on file  Physical Activity:   . Days of Exercise per Week: Not on file  . Minutes of Exercise per Session: Not on file  Stress:   . Feeling of Stress : Not on file  Social Connections:   . Frequency of Communication with Friends and Family: Not on file  .  Frequency of Social Gatherings with Friends and Family: Not on file  . Attends Religious Services: Not on file  . Active Member of Clubs or Organizations: Not on file  . Attends Archivist Meetings: Not on file  . Marital Status: Not on file  Intimate Partner Violence:   . Fear of Current or Ex-Partner: Not on file  . Emotionally Abused: Not on file  . Physically Abused: Not on file  . Sexually Abused: Not on file    FAMILY HISTORY:  Family History  Problem Relation Age of Onset  . Stroke Mother 29  . Heart disease Mother   . Coronary artery disease Brother 23  . Diabetes Brother   . Heart disease Brother   . Coronary artery disease Brother 27  .  Diabetes Brother   . Heart disease Brother   . Cancer Sister        breast  . Heart disease Sister   . Diabetes Sister   . Heart disease Sister   . Diabetes Sister   . Heart disease Sister   . Diabetes Sister   . Heart disease Sister   . Diabetes Brother   . Heart disease Brother   . Heart attack Brother   . Heart disease Brother   . Heart disease Brother   . Heart disease Brother     CURRENT MEDICATIONS:  Outpatient Encounter Medications as of 03/22/2019  Medication Sig  . Accu-Chek FastClix Lancets MISC USE TO CHECK BLOOD SUGAR UP TO FOUR TIMES DAILY Dx E11.9  . acetaminophen (TYLENOL) 500 MG tablet Take 500 mg by mouth 2 (two) times daily. Pt takes 2 tablets at bedtime  . amLODipine (NORVASC) 10 MG tablet 1 tablet daily  . aspirin 81 MG chewable tablet Chew 81 mg by mouth daily.  . blood glucose meter kit and supplies Dispense based on patient and insurance preference. Use up to four times daily as directed. (FOR ICD-10 E10.9, E11.9).  . Calcium-Magnesium-Vitamin D (CALCIUM 1200+D3 PO) Take 1 capsule by mouth.  . cephALEXin (KEFLEX) 250 MG capsule Take 250 mg by mouth 4 (four) times daily.  . cholecalciferol (VITAMIN D) 400 UNITS TABS Take by mouth. VITAMIN D3 daily   . diazepam (VALIUM) 5 MG tablet Take 1 tablet (5 mg total) by mouth 2 (two) times daily. Pt takes one tablet at daytime and one tablet at bedtime  . docusate sodium (COLACE) 100 MG capsule Take 100 mg by mouth 2 (two) times daily.  Marland Kitchen escitalopram (LEXAPRO) 10 MG tablet TAKE 1 TABLET(10 MG) BY MOUTH DAILY  . Ferrous Sulfate (IRON) 325 (65 Fe) MG TABS Take 1 tablet by mouth.  . fluticasone (FLONASE) 50 MCG/ACT nasal spray SHAKE LIQUID AND USE 2 SPRAYS IN EACH NOSTRIL DAILY  . fluticasone (FLOVENT HFA) 110 MCG/ACT inhaler Inhale 2 puffs into the lungs 2 (two) times daily.  Marland Kitchen gabapentin (NEURONTIN) 300 MG capsule Take 1 capsule (300 mg total) by mouth 3 (three) times daily.  Marland Kitchen icosapent Ethyl (VASCEPA) 1 g capsule  TAKE 4 CAPSULES BY MOUTH EVERY DAY  . ipratropium-albuterol (DUONEB) 0.5-2.5 (3) MG/3ML SOLN USE 1 VIAL VIA NEBULIZER EVERY 6 HOURS  . isosorbide mononitrate (IMDUR) 60 MG 24 hr tablet Take 1 tablet (60 mg total) by mouth every morning.  Marland Kitchen levothyroxine (SYNTHROID) 88 MCG tablet Take 1 tablet (88 mcg total) by mouth daily.  . metFORMIN (GLUCOPHAGE-XR) 500 MG 24 hr tablet TAKE 2 TABLETS(1000 MG) BY MOUTH DAILY WITH BREAKFAST  . metoprolol tartrate (  LOPRESSOR) 25 MG tablet Take 1 tablet (25 mg total) by mouth 2 (two) times daily.  . mirabegron ER (MYRBETRIQ) 25 MG TB24 tablet Take 1 tablet (25 mg total) by mouth daily.  . misoprostol (CYTOTEC) 200 MCG tablet Take 1 tablet (200 mcg total) by mouth 4 (four) times daily.  . montelukast (SINGULAIR) 10 MG tablet Take 1 tablet (10 mg total) by mouth at bedtime.  . Multiple Vitamins-Minerals (HAIR SKIN AND NAILS FORMULA PO) Take 1 tablet by mouth daily.  Marland Kitchen omeprazole (PRILOSEC) 20 MG capsule TAKE 1 CAPSULE(20 MG) BY MOUTH TWICE DAILY BEFORE A MEAL  . ONE TOUCH ULTRA TEST test strip TEST TWICE DAILY.  Marland Kitchen psyllium (REGULOID) 0.52 g capsule Take 0.52 g by mouth daily.  . raloxifene (EVISTA) 60 MG tablet TAKE 1 TABLET(60 MG) BY MOUTH DAILY  . sitaGLIPtin (JANUVIA) 100 MG tablet TAKE 1 TABLET BY MOUTH EVERY DAY. DISCONTINUE GLIPIZIDE.  Marland Kitchen diphenhydrAMINE (BENADRYL) 25 mg capsule Take 25 mg by mouth every 6 (six) hours as needed.  . hydrOXYzine (VISTARIL) 25 MG capsule Take 1 capsule (25 mg total) by mouth 3 (three) times daily as needed. (Patient not taking: Reported on 03/22/2019)  . nitroGLYCERIN (NITROSTAT) 0.4 MG SL tablet DISSOLVE 1 TABLET UNDER THE TONGUE EVERY 5 MINUTES FOR UP TO 3 DOSES AS NEEDED FOR CHEST PAIN AS DIRECTED (Patient not taking: Reported on 03/22/2019)   No facility-administered encounter medications on file as of 03/22/2019.    ALLERGIES:  Allergies  Allergen Reactions  . Milk-Related Compounds Itching and Nausea And Vomiting  . Eggs  Or Egg-Derived Products Itching, Nausea And Vomiting and Other (See Comments)    headaches  . Lac Bovis Other (See Comments)    Per PCP office  . Other Other (See Comments)    Other reaction(s): Other (See Comments) Per [facility] Other reaction(s): Other (See Comments) Per [facility] Per [facility]  . Statins   . Crestor [Rosuvastatin]     Myalgias   . Ezetimibe Other (See Comments)    mylagia Per PCP office     PHYSICAL EXAM:  ECOG Performance status: 1  Vitals:   03/22/19 1216  BP: (!) 113/56  Pulse: 91  Resp: 18  Temp: (!) 97.5 F (36.4 C)  SpO2: 94%   Filed Weights   03/22/19 1216  Weight: 196 lb 11.2 oz (89.2 kg)    Physical Exam Constitutional:      Appearance: Normal appearance. She is normal weight.  Cardiovascular:     Rate and Rhythm: Normal rate and regular rhythm.     Heart sounds: Normal heart sounds.  Pulmonary:     Effort: Pulmonary effort is normal.     Breath sounds: Normal breath sounds.  Abdominal:     General: Bowel sounds are normal.     Palpations: Abdomen is soft.  Musculoskeletal:        General: Normal range of motion.  Skin:    General: Skin is warm.  Neurological:     Mental Status: She is alert and oriented to person, place, and time. Mental status is at baseline.  Psychiatric:        Mood and Affect: Mood normal.        Behavior: Behavior normal.        Thought Content: Thought content normal.        Judgment: Judgment normal.      LABORATORY DATA:  I have reviewed the labs as listed.  CBC  Component Value Date/Time   WBC 6.6 03/19/2019 1210   WBC 7.6 03/15/2019 1011   RBC 4.69 03/19/2019 1210   RBC 4.28 03/15/2019 1011   HGB 14.2 03/19/2019 1210   HCT 42.3 03/19/2019 1210   PLT 225 03/19/2019 1210   MCV 90 03/19/2019 1210   MCH 30.3 03/19/2019 1210   MCH 30.1 03/15/2019 1011   MCHC 33.6 03/19/2019 1210   MCHC 32.1 03/15/2019 1011   RDW 12.8 03/19/2019 1210   LYMPHSABS 2.7 03/19/2019  1210   MONOABS 0.6 03/15/2019 1011   EOSABS 0.4 03/19/2019 1210   BASOSABS 0.1 03/19/2019 1210   CMP Latest Ref Rng & Units 03/19/2019 03/15/2019 12/18/2018  Glucose 65 - 99 mg/dL 125(H) 108(H) 111(H)  BUN 8 - 27 mg/dL _0 Creatinine 0.57 - 1.00 mg/dL 1.17(H) 1.18(H) 1.13(H)  Sodium 134 - 144 mmol/L 139 138 141  Potassium 3.5 - 5.2 mmol/L 4.5 4.1 4.3  Chloride 96 - 106 mmol/L 100 102 101  CO2 20 - 29 mmol/L _1 Calcium 8.7 - 10.3 mg/dL 10.5(H) 9.1 9.5  Total Protein 6.0 - 8.5 g/dL 6.8 6.7 6.4  Total Bilirubin 0.0 - 1.2 mg/dL 0.3 0.7 0.3  Alkaline Phos 39 - 117 IU/L 46 37(L) 50  AST 0 - 40 IU/L _2 ALT 0 - 32 IU/L _3 I personally performed a face-to-face visit.  All questions were answered to patient's stated satisfaction. Encouraged patient to call with any new concerns or questions before his next visit to the cancer center and we can certain see him sooner, if needed.     ASSESSMENT & PLAN:   Iron deficiency anemia 1.  Normocytic anemia: -Likely from a combination of iron deficiency, CKD and B12 deficiency. - She last had Feraheme on 07/05/2017 and 07/12/2017. -She takes 1 tablet of iron daily.  She does get constipated however the stool softeners we recommended are helping. - She denies any bleeding per rectum or melena. -Labs on 03/19/2019 showed her WBC 6.6, hemoglobin 14.2, ferritin 292, and percent saturation 38.  Creatinine at this time is 1.10. - I do not recommend any IV iron at this time.  She will continue to take her oral iron daily. - She will follow-up in 4 months with repeat labs.  2.  B12 deficiency: - Labs on 03/19/2019 showed her vitamin B12 level rater than 2000 -She was told to stop taking her B12 at this time -We will follow-up labs next time.       Orders placed this encounter:  Orders Placed This Encounter  Procedures  . Lactate dehydrogenase  . CBC with Differential/Platelet  . Comprehensive metabolic panel  . Ferritin   . Iron and TIBC  . Vitamin B12  . VITAMIN D 25 Hydroxy (Vit-D Deficiency, Fractures)  . Folate     Francene Finders, FNP-C [place] [phone]

## 2019-03-22 NOTE — Patient Instructions (Signed)
[facility] Discharge Instructions  Follow up in 4 months with labs    Thank you for choosing [facility] at [facility] to provide your oncology and hematology care.  To afford each patient quality time with our provider, please arrive at least 15 minutes before your scheduled appointment time.   If you have a lab appointment with the Cancer Center please come in thru the Main Entrance and check in at the main information desk.  You need to re-schedule your appointment should you arrive 10 or more minutes late.  We strive to give you quality time with our providers, and arriving late affects you and other patients whose appointments are after yours.  Also, if you no show three or more times for appointments you may be dismissed from the clinic at the providers discretion.     Again, thank you for choosing [facility].  Our hope is that these requests will decrease the amount of time that you wait before being seen by our physicians.       _____________________________________________________________  Should you have questions after your visit to [facility], please contact our office at [phone] between the hours of 8:00 a.m. and 4:30 p.m.  Voicemails left after 4:00 p.m. will not be returned until the following business day.  For prescription refill requests, have your pharmacy contact our office and allow 72 hours.    Due to Covid, you will need to wear a mask upon entering the hospital. If you do not have a mask, a mask will be given to you at the Main Entrance upon arrival. For doctor visits, patients may have 1 support person with them. For treatment visits, patients can not have anyone with them due to social distancing guidelines and our immunocompromised population.      

## 2019-03-22 NOTE — Assessment & Plan Note (Addendum)
1.  Normocytic anemia: -Likely from a combination of iron deficiency, CKD and B12 deficiency. - She last had Feraheme on 07/05/2017 and 07/12/2017. -She takes 1 tablet of iron daily.  She does get constipated however the stool softeners we recommended are helping. - She denies any bleeding per rectum or melena. -Labs on 03/19/2019 showed her WBC 6.6, hemoglobin 14.2, ferritin 292, and percent saturation 38.  Creatinine at this time is 1.10. - I do not recommend any IV iron at this time.  She will continue to take her oral iron daily. - She will follow-up in 4 months with repeat labs.  2.  B12 deficiency: - Labs on 03/19/2019 showed her vitamin B12 level rater than 2000 -She was told to stop taking her B12 at this time -We will follow-up labs next time.

## 2019-04-15 DIAGNOSIS — R110 Nausea: Secondary | ICD-10-CM | POA: Diagnosis not present

## 2019-04-15 DIAGNOSIS — J40 Bronchitis, not specified as acute or chronic: Secondary | ICD-10-CM | POA: Diagnosis not present

## 2019-04-15 DIAGNOSIS — J0100 Acute maxillary sinusitis, unspecified: Secondary | ICD-10-CM | POA: Diagnosis not present

## 2019-04-15 DIAGNOSIS — R0989 Other specified symptoms and signs involving the circulatory and respiratory systems: Secondary | ICD-10-CM | POA: Diagnosis not present

## 2019-04-18 ENCOUNTER — Other Ambulatory Visit: Payer: Self-pay

## 2019-04-18 ENCOUNTER — Ambulatory Visit (INDEPENDENT_AMBULATORY_CARE_PROVIDER_SITE_OTHER): Payer: Medicare Other | Admitting: Family

## 2019-04-18 ENCOUNTER — Telehealth: Payer: Self-pay | Admitting: Nurse Practitioner

## 2019-04-18 ENCOUNTER — Ambulatory Visit: Payer: Medicare Other | Admitting: Family

## 2019-04-18 ENCOUNTER — Encounter: Payer: Self-pay | Admitting: Family

## 2019-04-18 DIAGNOSIS — J449 Chronic obstructive pulmonary disease, unspecified: Secondary | ICD-10-CM

## 2019-04-18 DIAGNOSIS — U071 COVID-19: Secondary | ICD-10-CM

## 2019-04-18 DIAGNOSIS — E1142 Type 2 diabetes mellitus with diabetic polyneuropathy: Secondary | ICD-10-CM

## 2019-04-18 NOTE — Progress Notes (Signed)
Virtual Visit via telephone Note Due to COVID-19 pandemic this visit was conducted virtually. This visit type was conducted due to national recommendations for restrictions regarding the COVID-19 Pandemic (e.g. social distancing, sheltering in place) in an effort to limit this patient's exposure and mitigate transmission in our community. All issues noted in this document were discussed and addressed.  A physical exam was not performed with this format.  I connected with Denise Gardner on 04/18/19 at 11:25 AM by telephone and verified that I am speaking with the correct person using two identifiers. Denise Gardner is currently located at home and no one is currently with her during visit. The provider, Evelina Dun, FNP is located in their office at time of visit.  I discussed the limitations, risks, security and privacy concerns of performing an evaluation and management service by telephone and the availability of in person appointments. I also discussed with the patient that there may be a patient responsible charge related to this service. The patient expressed understanding and agreed to proceed.   History and Present Illness:  Pt calls the office today with COVID positive. Her symptoms started on 04/13/19. She was seen at the Urgent Care on 04/15/19 and given Zpak, zofran, and dexamethasone. She reports she is currently laying in bed.  Cough This is a new problem. The problem has been waxing and waning. The problem occurs every few minutes. The cough is non-productive. Associated symptoms include chills, ear congestion, ear pain, headaches, myalgias, nasal congestion, postnasal drip, a sore throat, shortness of breath and wheezing. Pertinent negatives include no fever. The symptoms are aggravated by lying down. She has tried rest for the symptoms.      Review of Systems  Constitutional: Positive for chills. Negative for fever.  HENT: Positive for ear pain, postnasal drip and sore throat.     Respiratory: Positive for cough, shortness of breath and wheezing.   Musculoskeletal: Positive for myalgias.  Neurological: Positive for headaches.     Observations/Objective: No SOB or distress noted, hoarse voice noted  Assessment and Plan: 1. COVID-19 virus detected - MyChart COVID-19 home monitoring program; Future  2. Type 2 diabetes mellitus with diabetic polyneuropathy, without long-term current use of insulin ([place]) - MyChart COVID-19 home monitoring program; Future  3. Chronic obstructive pulmonary disease, unspecified COPD type ([place]) - MyChart COVID-19 home monitoring program; Future  I have called and left a message for  patient to be referred to the [place] for the monoclonal antibody infusion. Continue current medications Rest Continue to quarantine Call if symptoms worsen and answer all phone calls as it may the infusion center    I discussed the assessment and treatment plan with the patient. The patient was provided an opportunity to ask questions and all were answered. The patient agreed with the plan and demonstrated an understanding of the instructions.   The patient was advised to call back or seek an in-person evaluation if the symptoms worsen or if the condition fails to improve as anticipated.  The above assessment and management plan was discussed with the patient. The patient verbalized understanding of and has agreed to the management plan. Patient is aware to call the clinic if symptoms persist or worsen. Patient is aware when to return to the clinic for a follow-up visit. Patient educated on when it is appropriate to go to the emergency department.   Time call ended:  11:41 AM  I provided 16 minutes of non-face-to-face time during this encounter.  Evelina Dun, FNP

## 2019-04-18 NOTE — Telephone Encounter (Signed)
Called to discuss with [name] about Covid symptoms and the use of bamlanivimab, a monoclonal antibody infusion for those with mild to moderate Covid symptoms and at a high risk of hospitalization.     Pt is qualified for this infusion at the [facility] infusion center due to co-morbid conditions and/or a member of an at-risk group, however declines infusion at this time due to lack of transportation. She reports she does not have anyone to bring her to [facility] or [place]. She is located in [place], [place].  Symptoms tier reviewed as well as criteria for ending isolation.  Symptoms reviewed that would warrant ED/Hospital evaluation. Preventative practices reviewed. Patient verbalized understanding. Patient advised to call back if he decides that he does want to get infusion. Callback number to the infusion center given. Patient advised to go to Urgent care or ED with severe symptoms. Last date she would be eligible for infusion is 04/22/19.      Patient Active Problem List   Diagnosis Date Noted  . Controlled substance agreement signed 08/16/2018  . Myalgia 11/24/2017  . Iron deficiency anemia 05/26/2017  . Vitamin B 12 deficiency 05/26/2017  . Obesity (BMI 30-39.9) 05/25/2017  . Constipation 05/19/2016  . Hypokalemia 04/30/2015  . Depression 07/18/2014  . Diabetic neuropathy ([place]) 04/17/2014  . OAB (overactive bladder) 04/17/2014  . Peripheral neuropathy 04/17/2014  . Hypothyroidism 12/27/2012  . Osteopenia 05/30/2012  . Type 2 diabetes mellitus ([place]) 05/21/2012  . Asthma 05/21/2012  . Chronic obstructive pulmonary disease ([place]) 05/21/2012  . Gastroesophageal reflux disease 05/21/2012  . Hyperlipidemia associated with type 2 diabetes mellitus ([place]) 01/26/2011  . Hyperlipidemia 01/26/2011  . Hypertension associated with diabetes ([place])   . Anxiety   . Chronic coronary artery disease     Alda Lea, AGPCNP-BC Pager: [phone] Amion: N. [place]

## 2019-04-26 ENCOUNTER — Other Ambulatory Visit: Payer: Self-pay

## 2019-04-26 ENCOUNTER — Ambulatory Visit (INDEPENDENT_AMBULATORY_CARE_PROVIDER_SITE_OTHER): Payer: Medicare Other | Admitting: Family

## 2019-04-26 ENCOUNTER — Other Ambulatory Visit (HOSPITAL_COMMUNITY)
Admission: RE | Admit: 2019-04-26 | Discharge: 2019-04-26 | Disposition: A | Discharge status: Home / Self Care | Payer: Medicare Other | Source: Ambulatory Visit | Attending: Family | Admitting: Family

## 2019-04-26 ENCOUNTER — Encounter: Payer: Self-pay | Admitting: Family

## 2019-04-26 ENCOUNTER — Ambulatory Visit (HOSPITAL_COMMUNITY)
Admission: RE | Admit: 2019-04-26 | Discharge: 2019-04-26 | Disposition: A | Discharge status: Home / Self Care | Payer: Medicare Other | Source: Ambulatory Visit | Attending: Family | Admitting: Family

## 2019-04-26 DIAGNOSIS — R05 Cough: Secondary | ICD-10-CM | POA: Diagnosis not present

## 2019-04-26 DIAGNOSIS — U071 COVID-19: Secondary | ICD-10-CM

## 2019-04-26 LAB — COMPREHENSIVE METABOLIC PANEL
ALT: 18 U/L (ref 0–44)
AST: 22 U/L (ref 15–41)
Albumin: 3.5 g/dL (ref 3.5–5.0)
Alkaline Phosphatase: 48 U/L (ref 38–126)
Anion gap: 9 (ref 5–15)
BUN: 15 mg/dL (ref 8–23)
CO2: 31 mmol/L (ref 22–32)
Calcium: 9.6 mg/dL (ref 8.9–10.3)
Chloride: 100 mmol/L (ref 98–111)
Creatinine, Ser: 1.12 mg/dL — ABNORMAL HIGH (ref 0.44–1.00)
GFR calc Af Amer: 56 mL/min — ABNORMAL LOW (ref >60)
GFR calc non Af Amer: 48 mL/min — ABNORMAL LOW (ref >60)
Glucose, Bld: 127 mg/dL — ABNORMAL HIGH (ref 70–99)
Potassium: 4.2 mmol/L (ref 3.5–5.1)
Sodium: 140 mmol/L (ref 135–145)
Total Bilirubin: 0.6 mg/dL (ref 0.3–1.2)
Total Protein: 6.8 g/dL (ref 6.5–8.1)

## 2019-04-26 LAB — CBC WITH DIFFERENTIAL/PLATELET
Abs Immature Granulocytes: 0.03 10*3/uL (ref 0.00–0.07)
Basophils Absolute: 0.1 10*3/uL (ref 0.0–0.1)
Basophils Relative: 1 %
Eosinophils Absolute: 0.3 10*3/uL (ref 0.0–0.5)
Eosinophils Relative: 4 %
HCT: 40.9 % (ref 36.0–46.0)
Hemoglobin: 13.2 g/dL (ref 12.0–15.0)
Immature Granulocytes: 0 %
Lymphocytes Relative: 37 %
Lymphs Abs: 2.8 10*3/uL (ref 0.7–4.0)
MCH: 29.8 pg (ref 26.0–34.0)
MCHC: 32.3 g/dL (ref 30.0–36.0)
MCV: 92.3 fL (ref 80.0–100.0)
Monocytes Absolute: 0.7 10*3/uL (ref 0.1–1.0)
Monocytes Relative: 9 %
Neutro Abs: 3.7 10*3/uL (ref 1.7–7.7)
Neutrophils Relative %: 49 %
Platelets: 202 10*3/uL (ref 150–400)
RBC: 4.43 MIL/uL (ref 3.87–5.11)
RDW: 13.1 % (ref 11.5–15.5)
WBC: 7.6 10*3/uL (ref 4.0–10.5)
nRBC: 0 % (ref 0.0–0.2)

## 2019-04-26 MED ORDER — HYDROXYZINE PAMOATE 25 MG PO CAPS: 25.0000 mg | ORAL_CAPSULE | Freq: Three times a day (TID) | ORAL | 1 refills | Status: DC | PRN

## 2019-04-26 MED ORDER — ONDANSETRON HCL 4 MG PO TABS: 4.0000 mg | ORAL_TABLET | Freq: Three times a day (TID) | ORAL | 0 refills | Status: DC | PRN

## 2019-04-26 NOTE — Progress Notes (Signed)
Virtual Visit via telephone Note Due to COVID-19 pandemic this visit was conducted virtually. This visit type was conducted due to national recommendations for restrictions regarding the COVID-19 Pandemic (e.g. social distancing, sheltering in place) in an effort to limit this patient's exposure and mitigate transmission in our community. All issues noted in this document were discussed and addressed.  A physical exam was not performed with this format.  I connected with Denise Gardner on 04/26/19 at 11:24 AM by telephone and verified that I am speaking with the correct person using two identifiers. Denise Gardner is currently located at a friends house and exhusband is currently with her during visit. The provider, Evelina Dun, FNP is located in their office at time of visit.  I discussed the limitations, risks, security and privacy concerns of performing an evaluation and management service by telephone and the availability of in person appointments. I also discussed with the patient that there may be a patient responsible charge related to this service. The patient expressed understanding and agreed to proceed.   History and Present Illness:  PT calls the office today with ear pain, rhinorrhea, headache, and pruritis. She was diagnosed with COVID on 04/15/19. Her symptoms started on 04/13/19. She was given dexamethasone, zpak, albuterol inhaler, and vistaril. She completed her Zpak and dexamethasone. She reports the vistaril 25 mg she takes at bedtime helps with her itching, but would like to start taking it during the day.  Cough The current episode started 1 to 4 weeks ago. The problem has been waxing and waning. The problem occurs every few minutes. The cough is non-productive. Associated symptoms include chills, ear congestion, ear pain, headaches, myalgias, postnasal drip, shortness of breath, sweats and wheezing.      Review of Systems  Constitutional: Positive for chills.  HENT:  Positive for ear pain and postnasal drip.   Respiratory: Positive for cough, shortness of breath and wheezing.   Musculoskeletal: Positive for myalgias.  Neurological: Positive for headaches.     Observations/Objective: No SOB or distress noted, no cough noted  Assessment and Plan: 1. COVID-19 virus detected Will do chest x-ray and labs to rule out pneumonia  Will increase Vistaril to TID as needed for itching If SOB worsens go to ED Call if symptoms worsen or do not improve  - DG Chest 2 View; Future - CMP14+EGFR; Future - CBC with Differential/Platelet; Future - hydrOXYzine (VISTARIL) 25 MG capsule; Take 1 capsule (25 mg total) by mouth 3 (three) times daily as needed.  Dispense: 90 capsule; Refill: 1 - ondansetron (ZOFRAN) 4 MG tablet; Take 1 tablet (4 mg total) by mouth every 8 (eight) hours as needed for nausea or vomiting.  Dispense: 20 tablet; Refill: 0    I discussed the assessment and treatment plan with the patient. The patient was provided an opportunity to ask questions and all were answered. The patient agreed with the plan and demonstrated an understanding of the instructions.   The patient was advised to call back or seek an in-person evaluation if the symptoms worsen or if the condition fails to improve as anticipated.  The above assessment and management plan was discussed with the patient. The patient verbalized understanding of and has agreed to the management plan. Patient is aware to call the clinic if symptoms persist or worsen. Patient is aware when to return to the clinic for a follow-up visit. Patient educated on when it is appropriate to go to the emergency department.   Time call ended: 11:38  AM   I provided 14 minutes of non-face-to-face time during this encounter.    Evelina Dun, FNP

## 2019-05-26 ENCOUNTER — Other Ambulatory Visit: Payer: Self-pay | Admitting: Family

## 2019-06-01 ENCOUNTER — Other Ambulatory Visit: Payer: Self-pay | Admitting: Family

## 2019-06-05 ENCOUNTER — Other Ambulatory Visit: Payer: Self-pay | Admitting: *Deleted

## 2019-06-05 DIAGNOSIS — F32A Depression, unspecified: Secondary | ICD-10-CM

## 2019-06-05 DIAGNOSIS — J069 Acute upper respiratory infection, unspecified: Secondary | ICD-10-CM

## 2019-06-05 DIAGNOSIS — K5900 Constipation, unspecified: Secondary | ICD-10-CM

## 2019-06-05 DIAGNOSIS — E1142 Type 2 diabetes mellitus with diabetic polyneuropathy: Secondary | ICD-10-CM

## 2019-06-05 DIAGNOSIS — J309 Allergic rhinitis, unspecified: Secondary | ICD-10-CM

## 2019-06-05 DIAGNOSIS — I152 Hypertension secondary to endocrine disorders: Secondary | ICD-10-CM

## 2019-06-05 DIAGNOSIS — E1159 Type 2 diabetes mellitus with other circulatory complications: Secondary | ICD-10-CM

## 2019-06-05 DIAGNOSIS — F329 Major depressive disorder, single episode, unspecified: Secondary | ICD-10-CM

## 2019-06-05 DIAGNOSIS — F1320 Sedative, hypnotic or anxiolytic dependence, uncomplicated: Secondary | ICD-10-CM

## 2019-06-05 DIAGNOSIS — F419 Anxiety disorder, unspecified: Secondary | ICD-10-CM

## 2019-06-05 DIAGNOSIS — K2100 Gastro-esophageal reflux disease with esophagitis, without bleeding: Secondary | ICD-10-CM

## 2019-06-05 DIAGNOSIS — M8580 Other specified disorders of bone density and structure, unspecified site: Secondary | ICD-10-CM

## 2019-06-05 MED ORDER — FLOVENT HFA 110 MCG/ACT IN AERO: 2.0000 | INHALATION_SPRAY | Freq: Two times a day (BID) | RESPIRATORY_TRACT | 0 refills | Status: DC

## 2019-06-05 MED ORDER — FLUTICASONE PROPIONATE 50 MCG/ACT NA SUSP: NASAL | 0 refills | Status: DC

## 2019-06-05 MED ORDER — METOPROLOL TARTRATE 25 MG PO TABS: 25.0000 mg | ORAL_TABLET | Freq: Two times a day (BID) | ORAL | 0 refills | Status: DC

## 2019-06-05 MED ORDER — LEVOTHYROXINE SODIUM 88 MCG PO TABS: 88.0000 ug | ORAL_TABLET | Freq: Every day | ORAL | 0 refills | Status: DC

## 2019-06-05 MED ORDER — ICOSAPENT ETHYL 1 G PO CAPS: ORAL_CAPSULE | ORAL | 0 refills | Status: DC

## 2019-06-05 MED ORDER — ESCITALOPRAM OXALATE 10 MG PO TABS: ORAL_TABLET | ORAL | 0 refills | Status: DC

## 2019-06-05 MED ORDER — MONTELUKAST SODIUM 10 MG PO TABS: 10.0000 mg | ORAL_TABLET | Freq: Every day | ORAL | 0 refills | Status: DC

## 2019-06-05 MED ORDER — OMEPRAZOLE 20 MG PO CPDR: DELAYED_RELEASE_CAPSULE | ORAL | 0 refills | Status: DC

## 2019-06-05 MED ORDER — MIRABEGRON ER 25 MG PO TB24: 25.0000 mg | ORAL_TABLET | Freq: Every day | ORAL | 0 refills | Status: DC

## 2019-06-05 MED ORDER — GABAPENTIN 300 MG PO CAPS: 300.0000 mg | ORAL_CAPSULE | Freq: Three times a day (TID) | ORAL | 0 refills | Status: DC

## 2019-06-05 MED ORDER — RALOXIFENE HCL 60 MG PO TABS: ORAL_TABLET | ORAL | 0 refills | Status: DC

## 2019-06-05 MED ORDER — AMLODIPINE BESYLATE 10 MG PO TABS: ORAL_TABLET | ORAL | 0 refills | Status: DC

## 2019-06-05 MED ORDER — MISOPROSTOL 200 MCG PO TABS: 200.0000 ug | ORAL_TABLET | Freq: Four times a day (QID) | ORAL | 0 refills | Status: DC

## 2019-06-05 MED ORDER — ISOSORBIDE MONONITRATE ER 60 MG PO TB24: ORAL_TABLET | ORAL | 0 refills | Status: DC

## 2019-06-05 MED ORDER — METFORMIN HCL ER 500 MG PO TB24: ORAL_TABLET | ORAL | 0 refills | Status: DC

## 2019-06-07 ENCOUNTER — Other Ambulatory Visit: Payer: Self-pay | Admitting: *Deleted

## 2019-06-07 MED ORDER — ONETOUCH VERIO VI STRP: ORAL_STRIP | 3 refills | Status: DC

## 2019-06-09 ENCOUNTER — Other Ambulatory Visit: Payer: Self-pay | Admitting: Family

## 2019-06-09 DIAGNOSIS — J309 Allergic rhinitis, unspecified: Secondary | ICD-10-CM

## 2019-06-09 DIAGNOSIS — M25552 Pain in left hip: Secondary | ICD-10-CM

## 2019-06-09 DIAGNOSIS — M25551 Pain in right hip: Secondary | ICD-10-CM

## 2019-06-09 DIAGNOSIS — J069 Acute upper respiratory infection, unspecified: Secondary | ICD-10-CM

## 2019-06-09 DIAGNOSIS — J4520 Mild intermittent asthma, uncomplicated: Secondary | ICD-10-CM

## 2019-06-17 ENCOUNTER — Other Ambulatory Visit: Payer: Self-pay | Admitting: Family

## 2019-06-17 ENCOUNTER — Encounter: Payer: Self-pay | Admitting: Family

## 2019-06-17 ENCOUNTER — Ambulatory Visit (INDEPENDENT_AMBULATORY_CARE_PROVIDER_SITE_OTHER): Payer: Medicare Other | Admitting: Family

## 2019-06-17 ENCOUNTER — Other Ambulatory Visit: Payer: Self-pay

## 2019-06-17 VITALS — BP 123/62 | HR 63 | Temp 97.1°F | Ht 64.4 in | Wt 197.2 lb

## 2019-06-17 DIAGNOSIS — E1142 Type 2 diabetes mellitus with diabetic polyneuropathy: Secondary | ICD-10-CM | POA: Diagnosis not present

## 2019-06-17 DIAGNOSIS — N3281 Overactive bladder: Secondary | ICD-10-CM | POA: Diagnosis not present

## 2019-06-17 DIAGNOSIS — E039 Hypothyroidism, unspecified: Secondary | ICD-10-CM

## 2019-06-17 DIAGNOSIS — I2510 Atherosclerotic heart disease of native coronary artery without angina pectoris: Secondary | ICD-10-CM | POA: Diagnosis not present

## 2019-06-17 DIAGNOSIS — K219 Gastro-esophageal reflux disease without esophagitis: Secondary | ICD-10-CM | POA: Diagnosis not present

## 2019-06-17 DIAGNOSIS — K5900 Constipation, unspecified: Secondary | ICD-10-CM

## 2019-06-17 DIAGNOSIS — E1169 Type 2 diabetes mellitus with other specified complication: Secondary | ICD-10-CM

## 2019-06-17 DIAGNOSIS — Z79899 Other long term (current) drug therapy: Secondary | ICD-10-CM

## 2019-06-17 DIAGNOSIS — J45909 Unspecified asthma, uncomplicated: Secondary | ICD-10-CM | POA: Diagnosis not present

## 2019-06-17 DIAGNOSIS — D509 Iron deficiency anemia, unspecified: Secondary | ICD-10-CM

## 2019-06-17 DIAGNOSIS — E669 Obesity, unspecified: Secondary | ICD-10-CM

## 2019-06-17 DIAGNOSIS — E1159 Type 2 diabetes mellitus with other circulatory complications: Secondary | ICD-10-CM | POA: Diagnosis not present

## 2019-06-17 DIAGNOSIS — F331 Major depressive disorder, recurrent, moderate: Secondary | ICD-10-CM

## 2019-06-17 DIAGNOSIS — I10 Essential (primary) hypertension: Secondary | ICD-10-CM | POA: Diagnosis not present

## 2019-06-17 DIAGNOSIS — E785 Hyperlipidemia, unspecified: Secondary | ICD-10-CM

## 2019-06-17 DIAGNOSIS — J449 Chronic obstructive pulmonary disease, unspecified: Secondary | ICD-10-CM

## 2019-06-17 DIAGNOSIS — F419 Anxiety disorder, unspecified: Secondary | ICD-10-CM

## 2019-06-17 DIAGNOSIS — I152 Hypertension secondary to endocrine disorders: Secondary | ICD-10-CM

## 2019-06-17 DIAGNOSIS — E538 Deficiency of other specified B group vitamins: Secondary | ICD-10-CM

## 2019-06-17 DIAGNOSIS — F1320 Sedative, hypnotic or anxiolytic dependence, uncomplicated: Secondary | ICD-10-CM

## 2019-06-17 LAB — URINALYSIS, COMPLETE
Bilirubin, UA: NEGATIVE
Glucose, UA: NEGATIVE
Ketones, UA: NEGATIVE
Leukocytes,UA: NEGATIVE
Nitrite, UA: NEGATIVE
Protein,UA: NEGATIVE
RBC, UA: NEGATIVE
Specific Gravity, UA: 1.01 (ref 1.005–1.030)
Urobilinogen, Ur: 0.2 mg/dL (ref 0.2–1.0)
pH, UA: 5 (ref 5.0–7.5)

## 2019-06-17 LAB — MICROSCOPIC EXAMINATION
Epithelial Cells (non renal): >10 /hpf — AB (ref 0–10)
RBC, Urine: NONE SEEN /hpf (ref 0–2)
Renal Epithel, UA: NONE SEEN /hpf

## 2019-06-17 LAB — BAYER DCA HB A1C WAIVED: HB A1C (BAYER DCA - WAIVED): 6.8 % (ref <7.0)

## 2019-06-17 MED ORDER — MIRABEGRON ER 50 MG PO TB24: 50.0000 mg | ORAL_TABLET | Freq: Every day | ORAL | 2 refills | Status: DC

## 2019-06-17 MED ORDER — DIAZEPAM 5 MG PO TABS: 5.0000 mg | ORAL_TABLET | Freq: Two times a day (BID) | ORAL | 2 refills | Status: DC

## 2019-06-17 NOTE — Progress Notes (Signed)
Subjective:    Patient ID: Denise Gardner, female    DOB: 01-25-1946, 74 y.o.   MRN: [id number]  Chief Complaint  Patient presents with  . Medical Management of Chronic Issues    having accidents on her self (urinating)   Pt presents to the office today for chronic follow up.She is followed byCardiologists annually for CAD. She is followed by Hematologists every68month for iron deficiency anemia and Vit B 12 deficiency. Diabetes She presents for her follow-up diabetic visit. She has type 2 diabetes mellitus. Her disease course has been worsening. Hypoglycemia symptoms include nervousness/anxiousness. Associated symptoms include blurred vision, fatigue and foot paresthesias. There are no hypoglycemic complications. Symptoms are worsening. Diabetic complications include heart disease. Risk factors for coronary artery disease include dyslipidemia, diabetes mellitus, hypertension, sedentary lifestyle and post-menopausal. Her weight is stable. She is following a generally unhealthy diet. Her overall blood glucose range is 140-180 mg/dl.  Hypertension This is a chronic problem. The current episode started more than 1 year ago. The problem has been resolved since onset. The problem is controlled. Associated symptoms include anxiety, blurred vision, malaise/fatigue and peripheral edema. Pertinent negatives include no shortness of breath. Risk factors for coronary artery disease include dyslipidemia, diabetes mellitus, obesity and sedentary lifestyle. The current treatment provides moderate improvement. Hypertensive end-organ damage includes kidney disease and CAD/MI. Identifiable causes of hypertension include a thyroid problem.  Hyperlipidemia This is a chronic problem. The current episode started more than 1 year ago. The problem is uncontrolled. Recent lipid tests were reviewed and are high. Exacerbating diseases include hypothyroidism and obesity. Pertinent negatives include no shortness of breath.  Current antihyperlipidemic treatment includes statins. The current treatment provides moderate improvement of lipids. Risk factors for coronary artery disease include diabetes mellitus, hypertension, a sedentary lifestyle, post-menopausal and dyslipidemia.  Thyroid Problem Presents for follow-up visit. Symptoms include anxiety, constipation, depressed mood and fatigue. Patient reports no diaphoresis or diarrhea. The symptoms have been stable. Her past medical history is significant for hyperlipidemia.  Anemia Presents for follow-up visit. Symptoms include bruises/bleeds easily and malaise/fatigue. Past medical history includes hypothyroidism.  Anxiety Presents for follow-up visit. Symptoms include depressed mood, excessive worry, irritability, nervous/anxious behavior and restlessness. Patient reports no shortness of breath. Symptoms occur occasionally. The severity of symptoms is moderate.   Her past medical history is significant for anemia.  Depression        This is a chronic problem.  The current episode started more than 1 year ago.   The onset quality is sudden.   The problem occurs intermittently.  The problem has been waxing and waning since onset.  Associated symptoms include fatigue, helplessness, hopelessness, irritable, restlessness and sad.  Past treatments include SSRIs - Selective serotonin reuptake inhibitors.  Compliance with treatment is good.  Past medical history includes hypothyroidism, thyroid problem and anxiety.   Urinary Frequency  This is a chronic problem. The current episode started more than 1 year ago. The problem occurs intermittently. The problem has been waxing and waning. The pain is at a severity of 2/10. The pain is mild. Associated symptoms include frequency.      Review of Systems  Constitutional: Positive for fatigue, irritability and malaise/fatigue. Negative for diaphoresis.  Eyes: Positive for blurred vision.  Respiratory: Negative for shortness of  breath.   Gastrointestinal: Positive for constipation. Negative for diarrhea.  Genitourinary: Positive for frequency.  Hematological: Bruises/bleeds easily.  Psychiatric/Behavioral: Positive for depression. The patient is nervous/anxious.   All other systems reviewed and are negative.  Objective:   Physical Exam Vitals reviewed.  Constitutional:      General: She is irritable. She is not in acute distress.    Appearance: She is well-developed.  HENT:     Head: Normocephalic and atraumatic.     Right Ear: Tympanic membrane normal.     Left Ear: Tympanic membrane normal.  Eyes:     Pupils: Pupils are equal, round, and reactive to light.  Neck:     Thyroid: No thyromegaly.  Cardiovascular:     Rate and Rhythm: Normal rate and regular rhythm.     Heart sounds: Normal heart sounds. No murmur.  Pulmonary:     Effort: Pulmonary effort is normal. No respiratory distress.     Breath sounds: Normal breath sounds. No wheezing.  Abdominal:     General: Bowel sounds are normal. There is no distension.     Palpations: Abdomen is soft.     Tenderness: There is no abdominal tenderness.  Musculoskeletal:        General: No tenderness.     Cervical back: Normal range of motion and neck supple.     Right lower leg: Edema (trace ) present.     Left lower leg: Edema (trace) present.     Comments: Pain in back with flexion and extension  Skin:    General: Skin is warm and dry.  Neurological:     Mental Status: She is alert and oriented to person, place, and time.     Cranial Nerves: No cranial nerve deficit.     Gait: Gait abnormal (using cane to walk).     Deep Tendon Reflexes: Reflexes are normal and symmetric.  Psychiatric:        Behavior: Behavior normal.        Thought Content: Thought content normal.        Judgment: Judgment normal.      BP 123/62   Pulse 63   Temp (!) 97.1 F (36.2 C) (Temporal)   Ht 5' 4.4" (1.636 m)   Wt 197 lb 3.2 oz (89.4 kg)   SpO2 98%   BMI  33.43 kg/m       Assessment & Plan:  Denise Gardner comes in today with chief complaint of Medical Management of Chronic Issues (having accidents on her self (urinating))   Diagnosis and orders addressed:  1. Chronic coronary artery disease - CMP14+EGFR  2. Hypertension associated with diabetes ([place]) - CMP14+EGFR  3. Uncomplicated asthma, unspecified asthma severity, unspecified whether persistent - CMP14+EGFR  4. Chronic obstructive pulmonary disease, unspecified COPD type ([place]) - CMP14+EGFR  5. Gastroesophageal reflux disease, unspecified whether esophagitis present - CMP14+EGFR  6. Diabetic polyneuropathy associated with type 2 diabetes mellitus (HCC) - CMP14+EGFR  7. Hyperlipidemia associated with type 2 diabetes mellitus (HCC) - CMP14+EGFR  8. Hypothyroidism, unspecified type - CMP14+EGFR  9. Type 2 diabetes mellitus with diabetic polyneuropathy, without long-term current use of insulin (HCC) - Bayer DCA Hb A1c Waived - CMP14+EGFR  10. Anxiety - CMP14+EGFR - diazepam (VALIUM) 5 MG tablet; Take 1 tablet (5 mg total) by mouth 2 (two) times daily. Pt takes one tablet at daytime and one tablet at bedtime  Dispense: 45 tablet; Refill: 2  11. Constipation, unspecified constipation type - CMP14+EGFR  12. Controlled substance agreement signed - CMP14+EGFR  13. Moderate episode of recurrent major depressive disorder (HCC)  - CMP14+EGFR  14. Iron deficiency anemia, unspecified iron deficiency anemia type - CMP14+EGFR  15. Obesity (BMI 30-39.9)  - CMP14+EGFR  16. Vitamin B 12 deficiency  - CMP14+EGFR  17. OAB (overactive bladder) - CMP14+EGFR - mirabegron ER (MYRBETRIQ) 50 MG TB24 tablet; Take 1 tablet (50 mg total) by mouth daily.  Dispense: 90 tablet; Refill: 2 - Urinalysis, Complete  18. Benzodiazepine dependence (HCC) - diazepam (VALIUM) 5 MG tablet; Take 1 tablet (5 mg total) by mouth 2 (two) times daily. Pt takes one tablet at daytime and one tablet  at bedtime  Dispense: 45 tablet; Refill: 2   Labs pending Health Maintenance reviewed Diet and exercise encouraged  Follow up plan: 3 months    Evelina Dun, FNP

## 2019-06-17 NOTE — Patient Instructions (Signed)

## 2019-06-18 ENCOUNTER — Ambulatory Visit (INDEPENDENT_AMBULATORY_CARE_PROVIDER_SITE_OTHER): Payer: Medicare Other | Admitting: *Deleted

## 2019-06-18 DIAGNOSIS — Z0000 Encounter for general adult medical examination without abnormal findings: Secondary | ICD-10-CM

## 2019-06-18 LAB — CMP14+EGFR
ALT: 10 IU/L (ref 0–32)
AST: 14 IU/L (ref 0–40)
Albumin/Globulin Ratio: 1.6 (ref 1.2–2.2)
Albumin: 3.9 g/dL (ref 3.7–4.7)
Alkaline Phosphatase: 46 IU/L (ref 39–117)
BUN/Creatinine Ratio: 14 (ref 12–28)
BUN: 18 mg/dL (ref 8–27)
Bilirubin Total: 0.3 mg/dL (ref 0.0–1.2)
CO2: 25 mmol/L (ref 20–29)
Calcium: 9.7 mg/dL (ref 8.7–10.3)
Chloride: 99 mmol/L (ref 96–106)
Creatinine, Ser: 1.31 mg/dL — ABNORMAL HIGH (ref 0.57–1.00)
GFR calc Af Amer: 46 mL/min/{1.73_m2} — ABNORMAL LOW (ref >59)
GFR calc non Af Amer: 40 mL/min/{1.73_m2} — ABNORMAL LOW (ref >59)
Globulin, Total: 2.5 g/dL (ref 1.5–4.5)
Glucose: 124 mg/dL — ABNORMAL HIGH (ref 65–99)
Potassium: 4.5 mmol/L (ref 3.5–5.2)
Sodium: 139 mmol/L (ref 134–144)
Total Protein: 6.4 g/dL (ref 6.0–8.5)

## 2019-06-18 NOTE — Patient Instructions (Signed)
Preventive Care 76 Years and Older, Female Preventive care refers to lifestyle choices and visits with your health care provider that can promote health and wellness. This includes:  A yearly physical exam. This is also called an annual well check.  Regular dental and eye exams.  Immunizations.  Screening for certain conditions.  Healthy lifestyle choices, such as diet and exercise. What can I expect for my preventive care visit? Physical exam Your health care provider will check:  Height and weight. These may be used to calculate body mass index (BMI), which is a measurement that tells if you are at a healthy weight.  Heart rate and blood pressure.  Your skin for abnormal spots. Counseling Your health care provider may ask you questions about:  Alcohol, tobacco, and drug use.  Emotional well-being.  Home and relationship well-being.  Sexual activity.  Eating habits.  History of falls.  Memory and ability to understand (cognition).  Work and work Statistician.  Pregnancy and menstrual history. What immunizations do I need?  Influenza (flu) vaccine  This is recommended every year. Tetanus, diphtheria, and pertussis (Tdap) vaccine  You may need a Td booster every 10 years. Varicella (chickenpox) vaccine  You may need this vaccine if you have not already been vaccinated. Zoster (shingles) vaccine  You may need this after age 76. Pneumococcal conjugate (PCV13) vaccine  One dose is recommended after age 76. Pneumococcal polysaccharide (PPSV23) vaccine  One dose is recommended after age 76. Measles, mumps, and rubella (MMR) vaccine  You may need at least one dose of MMR if you were born in 1957 or later. You may also need a second dose. Meningococcal conjugate (MenACWY) vaccine  You may need this if you have certain conditions. Hepatitis A vaccine  You may need this if you have certain conditions or if you travel or work in places where you may be exposed  to hepatitis A. Hepatitis B vaccine  You may need this if you have certain conditions or if you travel or work in places where you may be exposed to hepatitis B. Haemophilus influenzae type b (Hib) vaccine  You may need this if you have certain conditions. You may receive vaccines as individual doses or as more than one vaccine together in one shot (combination vaccines). Talk with your health care provider about the risks and benefits of combination vaccines. What tests do I need? Blood tests  Lipid and cholesterol levels. These may be checked every 5 years, or more frequently depending on your overall health.  Hepatitis C test.  Hepatitis B test. Screening  Lung cancer screening. You may have this screening every year starting at age 76 if you have a 30-pack-year history of smoking and currently smoke or have quit within the past 15 years.  Colorectal cancer screening. All adults should have this screening starting at age 76 and continuing until age 15. Your health care provider may recommend screening at age 76 if you are at increased risk. You will have tests every 1-10 years, depending on your results and the type of screening test.  Diabetes screening. This is done by checking your blood sugar (glucose) after you have not eaten for a while (fasting). You may have this done every 1-3 years.  Mammogram. This may be done every 1-2 years. Talk with your health care provider about how often you should have regular mammograms.  BRCA-related cancer screening. This may be done if you have a family history of breast, ovarian, tubal, or peritoneal cancers.  Other tests  Sexually transmitted disease (STD) testing.  Bone density scan. This is done to screen for osteoporosis. You may have this done starting at age 76. Follow these instructions at home: Eating and drinking  Eat a diet that includes fresh fruits and vegetables, whole grains, lean protein, and low-fat dairy products. Limit  your intake of foods with high amounts of sugar, saturated fats, and salt.  Take vitamin and mineral supplements as recommended by your health care provider.  Do not drink alcohol if your health care provider tells you not to drink.  If you drink alcohol: ? Limit how much you have to 0-1 drink a day. ? Be aware of how much alcohol is in your drink. In the [place] one drink equals one 12 oz bottle of beer (355 mL), one 5 oz glass of wine (148 mL), or one 1 oz glass of hard liquor (44 mL). Lifestyle  Take daily care of your teeth and gums.  Stay active. Exercise for at least 30 minutes on 5 or more days each week.  Do not use any products that contain nicotine or tobacco, such as cigarettes, e-cigarettes, and chewing tobacco. If you need help quitting, ask your health care provider.  If you are sexually active, practice safe sex. Use a condom or other form of protection in order to prevent STIs (sexually transmitted infections).  Talk with your health care provider about taking a low-dose aspirin or statin. What's next?  Go to your health care provider once a year for a well check visit.  Ask your health care provider how often you should have your eyes and teeth checked.  Stay up to date on all vaccines. This information is not intended to replace advice given to you by your health care provider. Make sure you discuss any questions you have with your health care provider. Document Revised: 02/08/2018 Document Reviewed: 02/08/2018 Elsevier Patient Education  2020 [employer].
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
## 2019-06-19 ENCOUNTER — Telehealth: Payer: Self-pay | Admitting: *Deleted

## 2019-06-19 NOTE — Telephone Encounter (Signed)
Aware of results. She will stop taking the two ibuprofen at night.

## 2019-07-13 ENCOUNTER — Other Ambulatory Visit: Payer: Self-pay | Admitting: Family

## 2019-07-13 DIAGNOSIS — K5900 Constipation, unspecified: Secondary | ICD-10-CM

## 2019-07-17 ENCOUNTER — Inpatient Hospital Stay (HOSPITAL_COMMUNITY): Payer: Medicare Other | Attending: Hematology

## 2019-07-17 ENCOUNTER — Other Ambulatory Visit: Payer: Self-pay

## 2019-07-17 DIAGNOSIS — D509 Iron deficiency anemia, unspecified: Secondary | ICD-10-CM | POA: Diagnosis not present

## 2019-07-17 DIAGNOSIS — E538 Deficiency of other specified B group vitamins: Secondary | ICD-10-CM | POA: Diagnosis not present

## 2019-07-17 LAB — CBC WITH DIFFERENTIAL/PLATELET
Abs Immature Granulocytes: 0.04 10*3/uL (ref 0.00–0.07)
Basophils Absolute: 0.1 10*3/uL (ref 0.0–0.1)
Basophils Relative: 1 %
Eosinophils Absolute: 0.4 10*3/uL (ref 0.0–0.5)
Eosinophils Relative: 4 %
HCT: 40.5 % (ref 36.0–46.0)
Hemoglobin: 12.7 g/dL (ref 12.0–15.0)
Immature Granulocytes: 0 %
Lymphocytes Relative: 25 %
Lymphs Abs: 2.5 10*3/uL (ref 0.7–4.0)
MCH: 29.1 pg (ref 26.0–34.0)
MCHC: 31.4 g/dL (ref 30.0–36.0)
MCV: 92.7 fL (ref 80.0–100.0)
Monocytes Absolute: 0.7 10*3/uL (ref 0.1–1.0)
Monocytes Relative: 7 %
Neutro Abs: 6.1 10*3/uL (ref 1.7–7.7)
Neutrophils Relative %: 63 %
Platelets: 193 10*3/uL (ref 150–400)
RBC: 4.37 MIL/uL (ref 3.87–5.11)
RDW: 14 % (ref 11.5–15.5)
WBC: 9.8 10*3/uL (ref 4.0–10.5)
nRBC: 0 % (ref 0.0–0.2)

## 2019-07-17 LAB — COMPREHENSIVE METABOLIC PANEL
ALT: 14 U/L (ref 0–44)
AST: 16 U/L (ref 15–41)
Albumin: 3.5 g/dL (ref 3.5–5.0)
Alkaline Phosphatase: 36 U/L — ABNORMAL LOW (ref 38–126)
Anion gap: 9 (ref 5–15)
BUN: 19 mg/dL (ref 8–23)
CO2: 30 mmol/L (ref 22–32)
Calcium: 9.7 mg/dL (ref 8.9–10.3)
Chloride: 100 mmol/L (ref 98–111)
Creatinine, Ser: 1.07 mg/dL — ABNORMAL HIGH (ref 0.44–1.00)
GFR calc Af Amer: 58 mL/min — ABNORMAL LOW (ref >60)
GFR calc non Af Amer: 50 mL/min — ABNORMAL LOW (ref >60)
Glucose, Bld: 127 mg/dL — ABNORMAL HIGH (ref 70–99)
Potassium: 4.7 mmol/L (ref 3.5–5.1)
Sodium: 139 mmol/L (ref 135–145)
Total Bilirubin: 0.5 mg/dL (ref 0.3–1.2)
Total Protein: 6.7 g/dL (ref 6.5–8.1)

## 2019-07-17 LAB — LACTATE DEHYDROGENASE: LDH: 150 U/L (ref 98–192)

## 2019-07-17 LAB — FERRITIN: Ferritin: 98 ng/mL (ref 11–307)

## 2019-07-17 LAB — IRON AND TIBC
Iron: 37 ug/dL (ref 28–170)
Saturation Ratios: 12 % (ref 10.4–31.8)
TIBC: 314 ug/dL (ref 250–450)
UIBC: 277 ug/dL

## 2019-07-17 LAB — VITAMIN D 25 HYDROXY (VIT D DEFICIENCY, FRACTURES): Vit D, 25-Hydroxy: 42.85 ng/mL (ref 30–100)

## 2019-07-17 LAB — FOLATE: Folate: 28.3 ng/mL (ref >5.9)

## 2019-07-17 LAB — VITAMIN B12: Vitamin B-12: 385 pg/mL (ref 180–914)

## 2019-07-24 ENCOUNTER — Inpatient Hospital Stay (HOSPITAL_BASED_OUTPATIENT_CLINIC_OR_DEPARTMENT_OTHER): Payer: Medicare Other | Admitting: Nurse Practitioner

## 2019-07-24 ENCOUNTER — Other Ambulatory Visit: Payer: Self-pay

## 2019-07-24 DIAGNOSIS — D509 Iron deficiency anemia, unspecified: Secondary | ICD-10-CM | POA: Diagnosis not present

## 2019-07-24 NOTE — Assessment & Plan Note (Addendum)
1.  Normocytic anemia: -Likely from a combination of iron deficiency, CKD and B12 deficiency. - She last had Feraheme on 07/05/2017 and 07/12/2017. -She takes 1 tablet of iron daily.  She does get constipated however the stool softeners we recommended are helping. - She denies any bleeding per rectum or melena. -Labs on 07/17/2019 showed hemoglobin 12.7, ferritin 98, and percent saturation 12.  Creatinine at this time is 1.07 - I do not recommend any IV iron at this time.  She will continue to take her oral iron daily. -She reports she was not taking her iron tablets on the weekends this might explain her drop in her ferritin levels.  She will start back taking it every day. - She will follow-up in 4 months with repeat labs.  2.  B12 deficiency: - Labs on 03/19/2019 showed her vitamin B12 level rater than 2000 -She was told to stop taking her B12 at this time -Labs done on 07/17/2019 showed her vitamin B12 level has returned back to normal at 385. -She will start back taking her B12 Monday through Friday only. -We will follow-up labs next time.
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
## 2019-08-06 ENCOUNTER — Other Ambulatory Visit: Payer: Self-pay | Admitting: Family

## 2019-08-06 DIAGNOSIS — F419 Anxiety disorder, unspecified: Secondary | ICD-10-CM

## 2019-08-06 DIAGNOSIS — J069 Acute upper respiratory infection, unspecified: Secondary | ICD-10-CM

## 2019-08-06 DIAGNOSIS — J309 Allergic rhinitis, unspecified: Secondary | ICD-10-CM

## 2019-08-06 DIAGNOSIS — E1142 Type 2 diabetes mellitus with diabetic polyneuropathy: Secondary | ICD-10-CM

## 2019-08-06 DIAGNOSIS — I152 Hypertension secondary to endocrine disorders: Secondary | ICD-10-CM

## 2019-08-06 DIAGNOSIS — M8580 Other specified disorders of bone density and structure, unspecified site: Secondary | ICD-10-CM

## 2019-08-06 DIAGNOSIS — K2100 Gastro-esophageal reflux disease with esophagitis, without bleeding: Secondary | ICD-10-CM

## 2019-08-06 DIAGNOSIS — E1159 Type 2 diabetes mellitus with other circulatory complications: Secondary | ICD-10-CM

## 2019-08-06 DIAGNOSIS — F32A Depression, unspecified: Secondary | ICD-10-CM

## 2019-08-06 DIAGNOSIS — J4520 Mild intermittent asthma, uncomplicated: Secondary | ICD-10-CM

## 2019-08-07 ENCOUNTER — Other Ambulatory Visit: Payer: Self-pay | Admitting: *Deleted

## 2019-08-07 DIAGNOSIS — N3281 Overactive bladder: Secondary | ICD-10-CM

## 2019-08-07 MED ORDER — MIRABEGRON ER 50 MG PO TB24: 50.0000 mg | ORAL_TABLET | Freq: Every day | ORAL | 1 refills | Status: DC

## 2019-09-03 ENCOUNTER — Other Ambulatory Visit: Payer: Self-pay | Admitting: Family

## 2019-09-05 ENCOUNTER — Other Ambulatory Visit: Payer: Self-pay | Admitting: Family

## 2019-09-05 DIAGNOSIS — J4520 Mild intermittent asthma, uncomplicated: Secondary | ICD-10-CM

## 2019-09-06 ENCOUNTER — Other Ambulatory Visit: Payer: Self-pay | Admitting: Family

## 2019-09-06 DIAGNOSIS — K5900 Constipation, unspecified: Secondary | ICD-10-CM

## 2019-09-07 ENCOUNTER — Other Ambulatory Visit: Payer: Self-pay | Admitting: Family

## 2019-09-07 DIAGNOSIS — M25552 Pain in left hip: Secondary | ICD-10-CM

## 2019-09-07 DIAGNOSIS — M25551 Pain in right hip: Secondary | ICD-10-CM

## 2019-09-18 ENCOUNTER — Other Ambulatory Visit: Payer: Self-pay | Admitting: Family

## 2019-09-18 DIAGNOSIS — F419 Anxiety disorder, unspecified: Secondary | ICD-10-CM

## 2019-09-18 DIAGNOSIS — F1320 Sedative, hypnotic or anxiolytic dependence, uncomplicated: Secondary | ICD-10-CM

## 2019-09-18 DIAGNOSIS — K5900 Constipation, unspecified: Secondary | ICD-10-CM

## 2019-10-02 ENCOUNTER — Other Ambulatory Visit: Payer: Self-pay | Admitting: Family

## 2019-10-02 DIAGNOSIS — K5900 Constipation, unspecified: Secondary | ICD-10-CM

## 2019-10-06 ENCOUNTER — Other Ambulatory Visit: Payer: Self-pay | Admitting: Family

## 2019-10-06 DIAGNOSIS — K5900 Constipation, unspecified: Secondary | ICD-10-CM

## 2019-10-06 DIAGNOSIS — F419 Anxiety disorder, unspecified: Secondary | ICD-10-CM

## 2019-10-06 DIAGNOSIS — F1320 Sedative, hypnotic or anxiolytic dependence, uncomplicated: Secondary | ICD-10-CM

## 2019-10-07 ENCOUNTER — Other Ambulatory Visit: Payer: Self-pay | Admitting: Family

## 2019-10-07 DIAGNOSIS — K5900 Constipation, unspecified: Secondary | ICD-10-CM

## 2019-10-07 NOTE — Telephone Encounter (Signed)
  Prescription Request  10/07/2019  What is the name of the medication or equipment? Misoprostol-200 mcg  Have you contacted your pharmacy to request a refill? (if applicable) yes  Which pharmacy would you like this sent to? [facility] in [place]   Patient notified that their request is being sent to the clinical staff for review and that they should receive a response within 2 business days.   Lenna Gilford' pt  They declined her.  She has an appt coming up.  She only has 3 or 4 pills left.

## 2019-10-08 MED ORDER — MISOPROSTOL 200 MCG PO TABS: ORAL_TABLET | ORAL | 0 refills | Status: DC

## 2019-10-10 ENCOUNTER — Other Ambulatory Visit: Payer: Self-pay | Admitting: Family

## 2019-10-10 NOTE — Telephone Encounter (Signed)
She takes a lot of these?

## 2019-10-17 ENCOUNTER — Ambulatory Visit (INDEPENDENT_AMBULATORY_CARE_PROVIDER_SITE_OTHER): Payer: Medicare Other | Admitting: Family

## 2019-10-17 ENCOUNTER — Telehealth: Payer: Self-pay | Admitting: Family

## 2019-10-17 ENCOUNTER — Other Ambulatory Visit: Payer: Self-pay

## 2019-10-17 ENCOUNTER — Encounter: Payer: Self-pay | Admitting: Family

## 2019-10-17 VITALS — BP 139/72 | HR 65 | Temp 97.8°F | Ht 64.4 in | Wt 202.2 lb

## 2019-10-17 DIAGNOSIS — F1320 Sedative, hypnotic or anxiolytic dependence, uncomplicated: Secondary | ICD-10-CM

## 2019-10-17 DIAGNOSIS — E1159 Type 2 diabetes mellitus with other circulatory complications: Secondary | ICD-10-CM

## 2019-10-17 DIAGNOSIS — R600 Localized edema: Secondary | ICD-10-CM

## 2019-10-17 DIAGNOSIS — E1142 Type 2 diabetes mellitus with diabetic polyneuropathy: Secondary | ICD-10-CM

## 2019-10-17 DIAGNOSIS — J45909 Unspecified asthma, uncomplicated: Secondary | ICD-10-CM

## 2019-10-17 DIAGNOSIS — E1169 Type 2 diabetes mellitus with other specified complication: Secondary | ICD-10-CM

## 2019-10-17 DIAGNOSIS — F419 Anxiety disorder, unspecified: Secondary | ICD-10-CM | POA: Diagnosis not present

## 2019-10-17 DIAGNOSIS — Z79899 Other long term (current) drug therapy: Secondary | ICD-10-CM

## 2019-10-17 DIAGNOSIS — I152 Hypertension secondary to endocrine disorders: Secondary | ICD-10-CM

## 2019-10-17 DIAGNOSIS — E538 Deficiency of other specified B group vitamins: Secondary | ICD-10-CM

## 2019-10-17 DIAGNOSIS — N3281 Overactive bladder: Secondary | ICD-10-CM

## 2019-10-17 DIAGNOSIS — J449 Chronic obstructive pulmonary disease, unspecified: Secondary | ICD-10-CM

## 2019-10-17 DIAGNOSIS — K5900 Constipation, unspecified: Secondary | ICD-10-CM

## 2019-10-17 DIAGNOSIS — R609 Edema, unspecified: Secondary | ICD-10-CM

## 2019-10-17 DIAGNOSIS — E039 Hypothyroidism, unspecified: Secondary | ICD-10-CM

## 2019-10-17 DIAGNOSIS — I2510 Atherosclerotic heart disease of native coronary artery without angina pectoris: Secondary | ICD-10-CM | POA: Diagnosis not present

## 2019-10-17 DIAGNOSIS — F331 Major depressive disorder, recurrent, moderate: Secondary | ICD-10-CM

## 2019-10-17 DIAGNOSIS — D509 Iron deficiency anemia, unspecified: Secondary | ICD-10-CM

## 2019-10-17 DIAGNOSIS — E669 Obesity, unspecified: Secondary | ICD-10-CM

## 2019-10-17 DIAGNOSIS — K219 Gastro-esophageal reflux disease without esophagitis: Secondary | ICD-10-CM

## 2019-10-17 DIAGNOSIS — E785 Hyperlipidemia, unspecified: Secondary | ICD-10-CM

## 2019-10-17 DIAGNOSIS — I10 Essential (primary) hypertension: Secondary | ICD-10-CM

## 2019-10-17 LAB — BAYER DCA HB A1C WAIVED: HB A1C (BAYER DCA - WAIVED): 6.4 % (ref <7.0)

## 2019-10-17 MED ORDER — DIAZEPAM 5 MG PO TABS: 5.0000 mg | ORAL_TABLET | Freq: Two times a day (BID) | ORAL | 2 refills | Status: DC

## 2019-10-17 NOTE — Progress Notes (Signed)
Subjective:    Patient ID: Denise Gardner, female    DOB: [birth year], 74 y.o.   MRN: [id number]  Chief Complaint  Patient presents with  . Medical Management of Chronic Issues    Mybetriq not working , has a bad cough at night when she lays down, ankles sweeling   . Diabetes  . Hypertension   Pt presents to the office today for chronic follow up.She is followed byCardiologists annually for CAD. She is followed by Hematologists  for iron deficiency anemia and Vit B 12 deficiency. Diabetes She presents for her follow-up diabetic visit. She has type 2 diabetes mellitus. Her disease course has been stable. Hypoglycemia symptoms include nervousness/anxiousness. Pertinent negatives for hypoglycemia include no dizziness. Associated symptoms include fatigue and foot paresthesias. Pertinent negatives for diabetes include no blurred vision. Symptoms are stable. Pertinent negatives for diabetic complications include no heart disease, nephropathy or peripheral neuropathy. Risk factors for coronary artery disease include dyslipidemia, diabetes mellitus, hypertension, sedentary lifestyle and post-menopausal. She is following a generally unhealthy diet. Her overall blood glucose range is 110-130 mg/dl.  Hypertension This is a chronic problem. The current episode started more than 1 year ago. The problem has been resolved since onset. The problem is controlled. Associated symptoms include anxiety, malaise/fatigue, peripheral edema and shortness of breath ("a little bit"). Pertinent negatives include no blurred vision. Risk factors for coronary artery disease include dyslipidemia, obesity and sedentary lifestyle. The current treatment provides moderate improvement. Identifiable causes of hypertension include a thyroid problem.  Gastroesophageal Reflux She complains of belching and heartburn. This is a chronic problem. The current episode started more than 1 year ago. The problem occurs occasionally. The problem  has been waxing and waning. Associated symptoms include fatigue. She has tried a PPI for the symptoms. The treatment provided moderate relief.  Thyroid Problem Presents for follow-up visit. Symptoms include anxiety, constipation, depressed mood, dry skin and fatigue. The symptoms have been stable. Her past medical history is significant for hyperlipidemia.  Hyperlipidemia This is a chronic problem. The current episode started more than 1 year ago. The problem is uncontrolled. Recent lipid tests were reviewed and are high. Exacerbating diseases include hypothyroidism. Associated symptoms include shortness of breath ("a little bit"). The current treatment provides no improvement of lipids. Risk factors for coronary artery disease include dyslipidemia, diabetes mellitus, hypertension and a sedentary lifestyle.  Urinary Frequency  This is a chronic problem. The current episode started more than 1 year ago. The problem occurs every urination. The problem has been gradually worsening. The patient is experiencing no pain. Associated symptoms include frequency. Treatments tried: myrbetriq. The treatment provided no relief.  Depression        This is a chronic problem.  The current episode started more than 1 year ago.   The onset quality is gradual.   The problem occurs intermittently.  The problem has been waxing and waning since onset.  Associated symptoms include fatigue, irritable, restlessness and sad.  Past treatments include SSRIs - Selective serotonin reuptake inhibitors.  Compliance with treatment is good.  Past medical history includes hypothyroidism, thyroid problem and anxiety.   Anxiety Presents for follow-up visit. Symptoms include depressed mood, nervous/anxious behavior, restlessness and shortness of breath ("a little bit"). Patient reports no dizziness.        Review of Systems  Constitutional: Positive for fatigue and malaise/fatigue.  Eyes: Negative for blurred vision.  Respiratory:  Positive for shortness of breath ("a little bit").   Gastrointestinal: Positive for constipation and  heartburn.  Genitourinary: Positive for frequency.  Neurological: Negative for dizziness.  Psychiatric/Behavioral: Positive for depression. The patient is nervous/anxious.   All other systems reviewed and are negative.      Objective:   Physical Exam Vitals reviewed.  Constitutional:      General: She is irritable. She is not in acute distress.    Appearance: She is well-developed. She is obese.  HENT:     Head: Normocephalic and atraumatic.     Right Ear: Tympanic membrane normal.     Left Ear: Tympanic membrane normal.  Eyes:     Pupils: Pupils are equal, round, and reactive to light.  Neck:     Thyroid: No thyromegaly.  Cardiovascular:     Rate and Rhythm: Normal rate and regular rhythm.     Heart sounds: Normal heart sounds. No murmur heard.   Pulmonary:     Effort: Pulmonary effort is normal. No respiratory distress.     Breath sounds: Normal breath sounds. No wheezing.  Abdominal:     General: Bowel sounds are normal. There is no distension.     Palpations: Abdomen is soft.     Tenderness: There is no abdominal tenderness.  Musculoskeletal:        General: No tenderness. Normal range of motion.     Cervical back: Normal range of motion and neck supple.     Right lower leg: Edema present.     Left lower leg: Edema present.  Skin:    General: Skin is warm and dry.  Neurological:     Mental Status: She is alert and oriented to person, place, and time.     Cranial Nerves: No cranial nerve deficit.     Deep Tendon Reflexes: Reflexes are normal and symmetric.  Psychiatric:        Behavior: Behavior normal.        Thought Content: Thought content normal.        Judgment: Judgment normal.    Diabetic Foot Exam - Simple   Simple Foot Form Diabetic Foot exam was performed with the following findings: Yes 10/17/2019 11:32 AM  Visual Inspection No deformities, no  ulcerations, no other skin breakdown bilaterally: Yes Sensation Testing Intact to touch and monofilament testing bilaterally: Yes Pulse Check Posterior Tibialis and Dorsalis pulse intact bilaterally: Yes Comments Toenail thick      BP 139/72   Pulse 65   Temp 97.8 F (36.6 C) (Temporal)   Ht 5' 4.4" (1.636 m)   Wt 202 lb 3.2 oz (91.7 kg)   SpO2 96%   BMI 34.28 kg/m      Assessment & Plan:  Denise Gardner comes in today with chief complaint of Medical Management of Chronic Issues (Mybetriq not working , has a bad cough at night when she lays down, ankles sweeling ), Diabetes, and Hypertension   Diagnosis and orders addressed:  1. Type 2 diabetes mellitus with diabetic polyneuropathy, without long-term current use of insulin (HCC) - Bayer DCA Hb A1c Waived  2. Anxiety - diazepam (VALIUM) 5 MG tablet; Take 1 tablet (5 mg total) by mouth 2 (two) times daily. Pt takes one tablet at daytime and one tablet at bedtime  Dispense: 45 tablet; Refill: 2  3. Benzodiazepine dependence (HCC) - diazepam (VALIUM) 5 MG tablet; Take 1 tablet (5 mg total) by mouth 2 (two) times daily. Pt takes one tablet at daytime and one tablet at bedtime  Dispense: 45 tablet; Refill: 2  4. Chronic coronary artery disease  5. Hypertension associated with diabetes ([place])  6. Uncomplicated asthma, unspecified asthma severity, unspecified whether persistent  7. Chronic obstructive pulmonary disease, unspecified COPD type ([place])  8. Gastroesophageal reflux disease, unspecified whether esophagitis present  9. Hyperlipidemia associated with type 2 diabetes mellitus ([place])  10. Hypothyroidism, unspecified type  11. OAB (overactive bladder)  12. Constipation, unspecified constipation type   13. Controlled substance agreement signed  14. Moderate episode of recurrent major depressive disorder ([place])  15. Iron deficiency anemia, unspecified iron deficiency anemia type   16. Obesity (BMI 30-39.9)  17.  Vitamin B 12 deficiency  18. Peripheral edema  - Compression stockings   Labs pending  Patient reviewed in [place] controlled database, no flags noted. Contract up dated today and drug screen will be updated on next visit because she just used the restroom Health Maintenance reviewed Diet and exercise encouraged  Follow up plan: 3 months    Evelina Dun, FNP

## 2019-10-17 NOTE — Patient Instructions (Signed)
Peripheral Edema  Peripheral edema is swelling that is caused by a buildup of fluid. Peripheral edema most often affects the lower legs, ankles, and feet. It can also develop in the arms, hands, and face. The area of the body that has peripheral edema will look swollen. It may also feel heavy or warm. Your clothes may start to feel tight. Pressing on the area may make a temporary dent in your skin. You may not be able to move your swollen arm or leg as much as usual. There are many causes of peripheral edema. It can happen because of a complication of other conditions such as congestive heart failure, kidney disease, or a problem with your blood circulation. It also can be a side effect of certain medicines or because of an infection. It often happens to women during pregnancy. Sometimes, the cause is not known. Follow these instructions at home: Managing pain, stiffness, and swelling   Raise (elevate) your legs while you are sitting or lying down.  Move around often to prevent stiffness and to lessen swelling.  Do not sit or stand for long periods of time.  Wear support stockings as told by your health care provider. Medicines  Take over-the-counter and prescription medicines only as told by your health care provider.  Your health care provider may prescribe medicine to help your body get rid of excess water (diuretic). General instructions  Pay attention to any changes in your symptoms.  Follow instructions from your health care provider about limiting salt (sodium) in your diet. Sometimes, eating less salt may reduce swelling.  Moisturize skin daily to help prevent skin from cracking and draining.  Keep all follow-up visits as told by your health care provider. This is important. Contact a health care provider if you have:  A fever.  Edema that starts suddenly or is getting worse, especially if you are pregnant or have a medical condition.  Swelling in only one leg.  Increased  swelling, redness, or pain in one or both of your legs.  Drainage or sores at the area where you have edema. Get help right away if you:  Develop shortness of breath, especially when you are lying down.  Have pain in your chest or abdomen.  Feel weak.  Feel faint. Summary  Peripheral edema is swelling that is caused by a buildup of fluid. Peripheral edema most often affects the lower legs, ankles, and feet.  Move around often to prevent stiffness and to lessen swelling. Do not sit or stand for long periods of time.  Pay attention to any changes in your symptoms.  Contact a health care provider if you have edema that starts suddenly or is getting worse, especially if you are pregnant or have a medical condition.  Get help right away if you develop shortness of breath, especially when lying down. This information is not intended to replace advice given to you by your health care provider. Make sure you discuss any questions you have with your health care provider. Document Revised: 11/08/2017 Document Reviewed: 11/08/2017 Elsevier Patient Education  2020 Elsevier Inc.  

## 2019-10-17 NOTE — Telephone Encounter (Signed)
Spoke with Corene Cornea from [facility] who says pt is there with a written Rx for Knee High CL. Says they cant use written Rx's. Send to send Rx electronically that includes providers NPI and Diag Codes. Also wanted to let provider know that with pt's insurance, it only covers 20/30 and lower. Can fax corrected Rx to them @ [phone]

## 2019-10-18 NOTE — Telephone Encounter (Signed)
Please fax. Thanks

## 2019-10-21 NOTE — Telephone Encounter (Signed)
Do you have the written RX?

## 2019-10-22 ENCOUNTER — Telehealth: Payer: Self-pay | Admitting: Family

## 2019-10-22 NOTE — Telephone Encounter (Signed)
Pt aware of A1C result and voiced understanding.

## 2019-10-23 NOTE — Telephone Encounter (Signed)
Please send, thanks.

## 2019-10-23 NOTE — Telephone Encounter (Signed)
Faxed

## 2019-11-07 ENCOUNTER — Other Ambulatory Visit: Payer: Self-pay | Admitting: Family

## 2019-11-07 DIAGNOSIS — K5900 Constipation, unspecified: Secondary | ICD-10-CM

## 2019-11-20 ENCOUNTER — Inpatient Hospital Stay (HOSPITAL_COMMUNITY): Payer: Medicare Other | Attending: Hematology

## 2019-11-20 ENCOUNTER — Other Ambulatory Visit: Payer: Self-pay

## 2019-11-20 DIAGNOSIS — E538 Deficiency of other specified B group vitamins: Secondary | ICD-10-CM | POA: Insufficient documentation

## 2019-11-20 DIAGNOSIS — E039 Hypothyroidism, unspecified: Secondary | ICD-10-CM | POA: Diagnosis not present

## 2019-11-20 DIAGNOSIS — D509 Iron deficiency anemia, unspecified: Secondary | ICD-10-CM

## 2019-11-20 DIAGNOSIS — N189 Chronic kidney disease, unspecified: Secondary | ICD-10-CM | POA: Insufficient documentation

## 2019-11-20 LAB — COMPREHENSIVE METABOLIC PANEL
ALT: 13 U/L (ref 0–44)
AST: 16 U/L (ref 15–41)
Albumin: 3.6 g/dL (ref 3.5–5.0)
Alkaline Phosphatase: 31 U/L — ABNORMAL LOW (ref 38–126)
Anion gap: 9 (ref 5–15)
BUN: 22 mg/dL (ref 8–23)
CO2: 29 mmol/L (ref 22–32)
Calcium: 9.9 mg/dL (ref 8.9–10.3)
Chloride: 99 mmol/L (ref 98–111)
Creatinine, Ser: 1.2 mg/dL — ABNORMAL HIGH (ref 0.44–1.00)
GFR calc Af Amer: 51 mL/min — ABNORMAL LOW (ref >60)
GFR calc non Af Amer: 44 mL/min — ABNORMAL LOW (ref >60)
Glucose, Bld: 125 mg/dL — ABNORMAL HIGH (ref 70–99)
Potassium: 4.2 mmol/L (ref 3.5–5.1)
Sodium: 137 mmol/L (ref 135–145)
Total Bilirubin: 0.8 mg/dL (ref 0.3–1.2)
Total Protein: 6.8 g/dL (ref 6.5–8.1)

## 2019-11-20 LAB — CBC WITH DIFFERENTIAL/PLATELET
Abs Immature Granulocytes: 0.02 10*3/uL (ref 0.00–0.07)
Basophils Absolute: 0.1 10*3/uL (ref 0.0–0.1)
Basophils Relative: 1 %
Eosinophils Absolute: 0.3 10*3/uL (ref 0.0–0.5)
Eosinophils Relative: 4 %
HCT: 40.8 % (ref 36.0–46.0)
Hemoglobin: 13.1 g/dL (ref 12.0–15.0)
Immature Granulocytes: 0 %
Lymphocytes Relative: 37 %
Lymphs Abs: 2.9 10*3/uL (ref 0.7–4.0)
MCH: 29.7 pg (ref 26.0–34.0)
MCHC: 32.1 g/dL (ref 30.0–36.0)
MCV: 92.5 fL (ref 80.0–100.0)
Monocytes Absolute: 0.6 10*3/uL (ref 0.1–1.0)
Monocytes Relative: 8 %
Neutro Abs: 3.8 10*3/uL (ref 1.7–7.7)
Neutrophils Relative %: 50 %
Platelets: 201 10*3/uL (ref 150–400)
RBC: 4.41 MIL/uL (ref 3.87–5.11)
RDW: 13.6 % (ref 11.5–15.5)
WBC: 7.7 10*3/uL (ref 4.0–10.5)
nRBC: 0 % (ref 0.0–0.2)

## 2019-11-20 LAB — IRON AND TIBC
Iron: 108 ug/dL (ref 28–170)
Saturation Ratios: 32 % — ABNORMAL HIGH (ref 10.4–31.8)
TIBC: 335 ug/dL (ref 250–450)
UIBC: 227 ug/dL

## 2019-11-20 LAB — VITAMIN B12: Vitamin B-12: 1635 pg/mL — ABNORMAL HIGH (ref 180–914)

## 2019-11-20 LAB — VITAMIN D 25 HYDROXY (VIT D DEFICIENCY, FRACTURES): Vit D, 25-Hydroxy: 42.97 ng/mL (ref 30–100)

## 2019-11-20 LAB — FOLATE: Folate: 20.8 ng/mL (ref >5.9)

## 2019-11-20 LAB — LACTATE DEHYDROGENASE: LDH: 134 U/L (ref 98–192)

## 2019-11-20 LAB — FERRITIN: Ferritin: 90 ng/mL (ref 11–307)

## 2019-11-27 ENCOUNTER — Other Ambulatory Visit: Payer: Self-pay

## 2019-11-27 ENCOUNTER — Inpatient Hospital Stay (HOSPITAL_BASED_OUTPATIENT_CLINIC_OR_DEPARTMENT_OTHER): Payer: Medicare Other | Admitting: Nurse Practitioner

## 2019-11-27 DIAGNOSIS — D509 Iron deficiency anemia, unspecified: Secondary | ICD-10-CM | POA: Diagnosis not present
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
## 2019-11-27 NOTE — Assessment & Plan Note (Signed)
1.  Normocytic anemia: -Likely from a combination of iron deficiency, CKD and B12 deficiency. - She last had Feraheme on 07/05/2017 and 07/12/2017. -She takes 1 tablet of iron daily.  She does get constipated however the stool softeners we recommended are helping. - She denies any bleeding per rectum or melena. -Labs on 9/22/2021showed hemoglobin 13.1, ferritin 90, and percent saturation 32.  Creatinine at this time is 1.20 - I do not recommend any IV iron at this time.  She will continue to take her oral iron daily. -She reports she was not taking her iron tablets on the weekends this might explain her drop in her ferritin levels.  She will start back taking it every day. - She will follow-up in 6 months with repeat labs.  2.  B12 deficiency: - Labs on 03/19/2019 showed her vitamin B12 level rater than 2000 -She was told to stop taking her B12 at this time -Labs done on 07/17/2019 showed her vitamin B12 level has returned back to normal at 385. -Labs done on 11/20/2019 showed her vitmain B12 is 1635 -She will start back taking her B12 Monday through Friday only. -We will follow-up labs next time.

## 2019-12-05 ENCOUNTER — Other Ambulatory Visit: Payer: Self-pay | Admitting: Family

## 2019-12-05 DIAGNOSIS — K5900 Constipation, unspecified: Secondary | ICD-10-CM

## 2019-12-06 ENCOUNTER — Other Ambulatory Visit: Payer: Self-pay | Admitting: Family

## 2019-12-06 DIAGNOSIS — M25552 Pain in left hip: Secondary | ICD-10-CM

## 2019-12-06 DIAGNOSIS — J069 Acute upper respiratory infection, unspecified: Secondary | ICD-10-CM

## 2019-12-06 DIAGNOSIS — M25551 Pain in right hip: Secondary | ICD-10-CM

## 2019-12-06 DIAGNOSIS — E1149 Type 2 diabetes mellitus with other diabetic neurological complication: Secondary | ICD-10-CM

## 2019-12-06 DIAGNOSIS — J309 Allergic rhinitis, unspecified: Secondary | ICD-10-CM

## 2019-12-21 ENCOUNTER — Other Ambulatory Visit: Payer: Self-pay | Admitting: Family

## 2019-12-21 DIAGNOSIS — E1149 Type 2 diabetes mellitus with other diabetic neurological complication: Secondary | ICD-10-CM

## 2019-12-21 DIAGNOSIS — E1142 Type 2 diabetes mellitus with diabetic polyneuropathy: Secondary | ICD-10-CM

## 2019-12-25 ENCOUNTER — Encounter: Payer: Self-pay | Admitting: Cardiology

## 2019-12-25 ENCOUNTER — Other Ambulatory Visit: Payer: Self-pay

## 2019-12-25 ENCOUNTER — Telehealth: Payer: Self-pay | Admitting: *Deleted

## 2019-12-25 ENCOUNTER — Ambulatory Visit (INDEPENDENT_AMBULATORY_CARE_PROVIDER_SITE_OTHER): Payer: Medicare Other | Admitting: Cardiology

## 2019-12-25 VITALS — BP 124/78 | HR 63 | Ht 64.5 in | Wt 200.0 lb

## 2019-12-25 DIAGNOSIS — E785 Hyperlipidemia, unspecified: Secondary | ICD-10-CM | POA: Diagnosis not present

## 2019-12-25 DIAGNOSIS — I10 Essential (primary) hypertension: Secondary | ICD-10-CM | POA: Diagnosis not present

## 2019-12-25 DIAGNOSIS — R072 Precordial pain: Secondary | ICD-10-CM

## 2019-12-25 DIAGNOSIS — R0602 Shortness of breath: Secondary | ICD-10-CM

## 2019-12-25 MED ORDER — ISOSORBIDE MONONITRATE ER 120 MG PO TB24: 120.0000 mg | ORAL_TABLET | Freq: Every day | ORAL | 3 refills | Status: DC

## 2019-12-25 NOTE — Telephone Encounter (Signed)
Pt calling today c/o chest pain and leg swelling that she has been having off and on for "awhile" now.  Worked into Dr Dynegy schedule for today at 2:20 pm.

## 2019-12-25 NOTE — Progress Notes (Signed)
Cardiology Office Note   Date:  12/25/2019   ID:  Denise Gardner, DOB 10/14/1945, MRN [id number]  PCP:  Sharion Balloon, FNP  Cardiologist:   Minus Breeding, MD  Chief Complaint  Patient presents with   Chest Pain   Leg Swelling      History of Present Illness: Denise Gardner is a 74 y.o. female who presents for evaluation of CAD.  I saw her last in 2014.  she had a stress perfusion study which demonstrated an EF of 71% and questionable inferior defect with questionable artifact vs infarct with mild periinfarct ischemia.    I last saw her in 2019. She had chest discomfort and on remarkable perfusion study with no evidence of ischemia. She returns for evaluation of  She presents for follow-up.  She scheduled herself to be seen.  She has been having chest pain.  It sounds like this is similar to what she had before but may be more frequent.  She has been taking nitroglycerin for years but says that sometimes now she takes 3-4 a day.  The pain she describes as sharp.  It happens sporadically.  He does not bring it on with activities.  She might take a nitroglycerin and it will go away or might have to take a second 1.  She cannot quantify whether it is moderate or severe.  She is not having any nausea vomiting or diaphoresis.  She does walk for exercise.  She gets short of breath when she walks but this has been unchanged.  She is not describing PND or orthopnea.  She says she will get short of breath when she has not been to get going again.  She thinks she fatigues easily which is again been a chronic problem.  She reports some swelling in her legs but it is nonpitting.  Her weights have been stable.  She is not describing palpitations, presyncope or syncope.   Past Medical History:  Diagnosis Date   Anxiety    depression   Asthma    Cataract    Chronic back pain    Coronary artery disease    Taxus stent to RCA 2008 2.5 x 16, non obstructive 15% proximal right coronary artery,  patent curcumflex LAD, preserved LV   Diabetes mellitus without complication (HCC)    GERD (gastroesophageal reflux disease)    Hyperlipidemia    Hypertension    Insomnia    Migraine    Neuropathy    Shingles    Thyroid disease    Ulcer, stomach peptic     Past Surgical History:  Procedure Laterality Date   TOTAL ABDOMINAL HYSTERECTOMY       Current Outpatient Medications  Medication Sig Dispense Refill   acetaminophen (TYLENOL) 500 MG tablet Take 500 mg by mouth 2 (two) times daily. Pt takes 2 tablets at bedtime     amLODipine (NORVASC) 10 MG tablet TAKE 1 TABLET BY MOUTH  DAILY 90 tablet 0   aspirin 81 MG chewable tablet Chew 81 mg by mouth daily.     Calcium-Magnesium-Vitamin D (CALCIUM 1200+D3 PO) Take 1 capsule by mouth.     cholecalciferol (VITAMIN D) 400 UNITS TABS Take by mouth. VITAMIN D3 daily      diazepam (VALIUM) 5 MG tablet Take 1 tablet (5 mg total) by mouth 2 (two) times daily. Pt takes one tablet at daytime and one tablet at bedtime 45 tablet 2   diphenhydrAMINE (BENADRYL) 25 mg capsule Take 25 mg by mouth  every 6 (six) hours as needed.     docusate sodium (COLACE) 100 MG capsule Take 100 mg by mouth 2 (two) times daily.     escitalopram (LEXAPRO) 10 MG tablet TAKE 1 TABLET BY MOUTH  DAILY 90 tablet 0   Ferrous Sulfate (IRON) 325 (65 Fe) MG TABS Take 1 tablet by mouth.     fluticasone (FLONASE) 50 MCG/ACT nasal spray SHAKE LIQUID AND USE 2 SPRAYS IN EACH NOSTRIL DAILY 48 g 0   fluticasone (FLOVENT HFA) 110 MCG/ACT inhaler Inhale 2 puffs into the lungs 2 (two) times daily. 3 Inhaler 0   gabapentin (NEURONTIN) 300 MG capsule TAKE 1 CAPSULE BY MOUTH 3  TIMES DAILY 270 capsule 0   hydrOXYzine (VISTARIL) 25 MG capsule Take 1 capsule (25 mg total) by mouth 3 (three) times daily as needed. 90 capsule 1   ipratropium-albuterol (DUONEB) 0.5-2.5 (3) MG/3ML SOLN USE 1 VIAL VIA NEBULIZER EVERY 6 HOURS 360 mL 1   levothyroxine (SYNTHROID) 88 MCG  tablet TAKE 1 TABLET BY MOUTH  DAILY 90 tablet 0   metFORMIN (GLUCOPHAGE-XR) 500 MG 24 hr tablet TAKE 2 TABLETS BY MOUTH  DAILY WITH BREAKFAST 180 tablet 0   metoprolol tartrate (LOPRESSOR) 25 MG tablet TAKE 1 TABLET BY MOUTH  TWICE DAILY 180 tablet 0   mirabegron ER (MYRBETRIQ) 50 MG TB24 tablet Take 1 tablet (50 mg total) by mouth daily. 90 tablet 1   misoprostol (CYTOTEC) 200 MCG tablet TAKE 1 TABLET BY MOUTH FOUR TIMES DAILY 120 tablet 0   montelukast (SINGULAIR) 10 MG tablet TAKE 1 TABLET(10 MG) BY MOUTH AT BEDTIME 90 tablet 0   Multiple Vitamins-Minerals (HAIR SKIN AND NAILS FORMULA PO) Take 1 tablet by mouth daily.     naproxen (NAPROSYN) 500 MG tablet TAKE 1 TABLET(500 MG) BY MOUTH TWICE DAILY WITH A MEAL 180 tablet 0   nitroGLYCERIN (NITROSTAT) 0.4 MG SL tablet DISSOLVE 1 TABLET UNDER THE TONGUE EVERY 5 MINUTES AS  NEEDED FOR CHEST PAIN. MAX  OF 3 TABLETS IN 15 MINUTES. CALL 911 IF PAIN PERSISTS. 100 tablet 3   omeprazole (PRILOSEC) 20 MG capsule TAKE 1 CAPSULE BY MOUTH  TWICE DAILY BEFORE A MEAL 180 capsule 0   psyllium (REGULOID) 0.52 g capsule Take 0.52 g by mouth daily.     raloxifene (EVISTA) 60 MG tablet TAKE 1 TABLET BY MOUTH  DAILY 90 tablet 0   sitaGLIPtin (JANUVIA) 100 MG tablet TAKE 1 TABLET BY MOUTH EVERY DAY 90 tablet 0   VASCEPA 1 g capsule TAKE 4 CAPSULES BY MOUTH EVERY DAY 360 capsule 0   Accu-Chek FastClix Lancets MISC CHECK BLOOD Denise UP TO FOUR TIMES DAILY Dx E11.69 408 each 3   blood glucose meter kit and supplies Dispense based on patient and insurance preference. Use up to four times daily as directed. (FOR ICD-10 E10.9, E11.9). 1 each 0   glucose blood (ONETOUCH ULTRA) test strip Test BS BID dx E11.9 200 strip 3   isosorbide mononitrate (IMDUR) 120 MG 24 hr tablet Take 1 tablet (120 mg total) by mouth daily. 90 tablet 3   No current facility-administered medications for this visit.    Allergies:   Milk-related compounds, Eggs or egg-derived  products, Lac bovis, Other, Statins, Crestor [rosuvastatin], and Ezetimibe   ROS:  Please see the history of present illness.   Otherwise, review of systems are positive for falls, decreased balance, headaches since she had Covid..   All other systems are reviewed and negative.  PHYSICAL EXAM: VS:  BP 124/78    Pulse 63    Ht 5' 4.5" (1.638 m)    Wt 200 lb (90.7 kg)    BMI 33.80 kg/m  , BMI Body mass index is 33.8 kg/m. GENERAL:  Well appearing NECK:  No jugular venous distention, waveform within normal limits, carotid upstroke brisk and symmetric, no bruits, no thyromegaly LUNGS:  Clear to auscultation bilaterally CHEST:  Unremarkable HEART:  PMI not displaced or sustained,S1 and S2 within normal limits, no S3, no S4, no clicks, no rubs, no murmurs ABD:  Flat, positive bowel sounds normal in frequency in pitch, no bruits, no rebound, no guarding, no midline pulsatile mass, no hepatomegaly, no splenomegaly EXT:  2 plus pulses throughout, no edema, no cyanosis no clubbing   EKG:  EKG is  ordered today. The ekg ordered today demonstrates sinus rhythm, rate 63, axis within normal limits, intervals within normal limits, no acute ST-T wave changes.  Poor anterior R wave progression, low voltage in the limb in chest leads significant change from previous.   Recent Labs: 03/19/2019: TSH 8.000 11/20/2019: ALT 13; BUN 22; Creatinine, Ser 1.20; Hemoglobin 13.1; Platelets 201; Potassium 4.2; Sodium 137    Lipid Panel    Component Value Date/Time   CHOL 267 (H) 03/19/2019 1210   CHOL 136 09/05/2012 1256   TRIG 112 03/19/2019 1210   TRIG 43 12/28/2012 0938   TRIG 38 09/05/2012 1256   HDL 58 03/19/2019 1210   HDL 66 12/28/2012 0938   HDL 59 09/05/2012 1256   CHOLHDL 4.6 (H) 03/19/2019 1210   LDLCALC 189 (H) 03/19/2019 1210   LDLCALC 80 12/28/2012 0938   LDLCALC 69 09/05/2012 1256      Wt Readings from Last 3 Encounters:  12/25/19 200 lb (90.7 kg)  10/17/19 202 lb 3.2 oz (91.7 kg)   06/17/19 197 lb 3.2 oz (89.4 kg)      Other studies Reviewed: Additional studies/ records that were reviewed today include: Labs Review of the above records demonstrates:  Please see elsewhere in the note.     ASSESSMENT AND PLAN:  CHEST PAIN:   Her chest pain is atypical.  However, she has previous known coronary disease and ongoing risk factors.  I would like to screen her with a treadmill test but she would be able to walk on a treadmill.  Therefore, she will have a [employer].  I am going to increase her Imdur to 120 mg daily.  HTN:  The blood pressure is at target.  No change in therapy.    DYSLIPIDEMIA:  Her LDL is 189 but she has not tolerated statins with muscle aches.  I would like to try to refer her to our lipid clinic to start PCSK9 inhibitor.    Current medicines are reviewed at length with the patient today.  The patient does not have concerns regarding medicines.  The following changes have been made: As above  Labs/ tests ordered today include:   Orders Placed This Encounter  Procedures   NM Myocar Multi W/Spect W/Wall Motion / EF     Disposition:   FU with me in 2 months   Signed, Minus Breeding, MD  12/25/2019 3:14 PM    [place]

## 2019-12-25 NOTE — Patient Instructions (Signed)
Medication Instructions:  Please increase your Isosorbide to 120 mg a day.  Continue all other medications as listed.  *If you need a refill on your cardiac medications before your next appointment, please call your pharmacy*  Testing/Procedures: Your physician has requested that you have a lexiscan myoview. For further information please visit [email or web address]. Please check in at the [facility].  Nothing to eat/drink 4 hours before and no caffeine 12 hours before the test.  You may have water with your medications.  You will be called to be scheduled for this testing.  Follow-Up: At [facility], you and your health needs are our priority.  As part of our continuing mission to provide you with exceptional heart care, we have created designated Provider Care Teams.  These Care Teams include your primary Cardiologist (physician) and Advanced Practice Providers (APPs -  Physician Assistants and Nurse Practitioners) who all work together to provide you with the care you need, when you need it.  We recommend signing up for the patient portal called "MyChart".  Sign up information is provided on this After Visit Summary.  MyChart is used to connect with patients for Virtual Visits (Telemedicine).  Patients are able to view lab/test results, encounter notes, upcoming appointments, etc.  Non-urgent messages can be sent to your provider as well.   To learn more about what you can do with MyChart, go to [email or web address].    Your next appointment:   2 month(s)  The format for your next appointment:   In Person  Provider:   Minus Breeding, MD   Thank you for choosing [facility]!!

## 2019-12-30 NOTE — Addendum Note (Signed)
Addended by: Shellia Cleverly on: 12/30/2019 08:08 AM   Modules accepted: Orders

## 2020-01-02 ENCOUNTER — Encounter (HOSPITAL_COMMUNITY)
Admission: RE | Admit: 2020-01-02 | Discharge: 2020-01-02 | Disposition: A | Discharge status: Home / Self Care | Payer: Medicare Other | Source: Ambulatory Visit | Attending: Cardiology | Admitting: Cardiology

## 2020-01-02 ENCOUNTER — Other Ambulatory Visit: Payer: Self-pay

## 2020-01-02 ENCOUNTER — Encounter (HOSPITAL_COMMUNITY): Payer: Self-pay

## 2020-01-02 ENCOUNTER — Encounter (HOSPITAL_COMMUNITY): Payer: Medicare Other

## 2020-01-02 DIAGNOSIS — I10 Essential (primary) hypertension: Secondary | ICD-10-CM | POA: Insufficient documentation

## 2020-01-02 DIAGNOSIS — R072 Precordial pain: Secondary | ICD-10-CM | POA: Insufficient documentation

## 2020-01-02 DIAGNOSIS — R0602 Shortness of breath: Secondary | ICD-10-CM | POA: Insufficient documentation

## 2020-01-02 LAB — NM MYOCAR MULTI W/SPECT W/WALL MOTION / EF
LV dias vol: 54 mL (ref 46–106)
LV sys vol: 17 mL
Peak HR: 85 {beats}/min
RATE: 0.35
Rest HR: 59 {beats}/min
SDS: 0
SRS: 0
SSS: 0
TID: 1.06

## 2020-01-02 MED ORDER — TECHNETIUM TC 99M TETROFOSMIN IV KIT
30.0000 | PACK | Freq: Once | INTRAVENOUS | Status: AC | PRN
  Administered 2020-01-02: 29 via INTRAVENOUS

## 2020-01-02 MED ORDER — REGADENOSON 0.4 MG/5ML IV SOLN
INTRAVENOUS | Status: AC
  Administered 2020-01-02: 0.4 mg via INTRAVENOUS
  Filled 2020-01-02: qty 5

## 2020-01-02 MED ORDER — SODIUM CHLORIDE FLUSH 0.9 % IV SOLN
INTRAVENOUS | Status: AC
  Administered 2020-01-02: 10 mL via INTRAVENOUS
  Filled 2020-01-02: qty 10

## 2020-01-02 MED ORDER — TECHNETIUM TC 99M TETROFOSMIN IV KIT
10.0000 | PACK | Freq: Once | INTRAVENOUS | Status: AC | PRN
  Administered 2020-01-02: 10.4 via INTRAVENOUS

## 2020-01-05 ENCOUNTER — Other Ambulatory Visit: Payer: Self-pay | Admitting: Family

## 2020-01-05 DIAGNOSIS — K5900 Constipation, unspecified: Secondary | ICD-10-CM

## 2020-01-06 ENCOUNTER — Telehealth: Payer: Self-pay | Admitting: *Deleted

## 2020-01-06 NOTE — Telephone Encounter (Signed)
Advised patient of results   Minus Breeding, MD  01/02/2020 8:33 PM EDT     Negative Lexiscan Myoview. No change in therapy.

## 2020-01-12 ENCOUNTER — Other Ambulatory Visit: Payer: Self-pay | Admitting: Family

## 2020-01-12 DIAGNOSIS — K5900 Constipation, unspecified: Secondary | ICD-10-CM

## 2020-01-12 DIAGNOSIS — E1142 Type 2 diabetes mellitus with diabetic polyneuropathy: Secondary | ICD-10-CM

## 2020-01-12 DIAGNOSIS — E1149 Type 2 diabetes mellitus with other diabetic neurological complication: Secondary | ICD-10-CM

## 2020-01-20 ENCOUNTER — Ambulatory Visit (INDEPENDENT_AMBULATORY_CARE_PROVIDER_SITE_OTHER): Payer: Medicare Other | Admitting: Family

## 2020-01-20 ENCOUNTER — Other Ambulatory Visit: Payer: Self-pay

## 2020-01-20 ENCOUNTER — Encounter: Payer: Self-pay | Admitting: Family

## 2020-01-20 VITALS — BP 110/60 | HR 68 | Temp 98.0°F | Ht 64.5 in | Wt 205.0 lb

## 2020-01-20 DIAGNOSIS — F419 Anxiety disorder, unspecified: Secondary | ICD-10-CM

## 2020-01-20 DIAGNOSIS — U071 COVID-19: Secondary | ICD-10-CM

## 2020-01-20 DIAGNOSIS — E785 Hyperlipidemia, unspecified: Secondary | ICD-10-CM

## 2020-01-20 DIAGNOSIS — F32A Depression, unspecified: Secondary | ICD-10-CM

## 2020-01-20 DIAGNOSIS — I2510 Atherosclerotic heart disease of native coronary artery without angina pectoris: Secondary | ICD-10-CM

## 2020-01-20 DIAGNOSIS — K5900 Constipation, unspecified: Secondary | ICD-10-CM

## 2020-01-20 DIAGNOSIS — J449 Chronic obstructive pulmonary disease, unspecified: Secondary | ICD-10-CM

## 2020-01-20 DIAGNOSIS — E1149 Type 2 diabetes mellitus with other diabetic neurological complication: Secondary | ICD-10-CM

## 2020-01-20 DIAGNOSIS — D509 Iron deficiency anemia, unspecified: Secondary | ICD-10-CM

## 2020-01-20 DIAGNOSIS — E1142 Type 2 diabetes mellitus with diabetic polyneuropathy: Secondary | ICD-10-CM

## 2020-01-20 DIAGNOSIS — J309 Allergic rhinitis, unspecified: Secondary | ICD-10-CM

## 2020-01-20 DIAGNOSIS — I152 Hypertension secondary to endocrine disorders: Secondary | ICD-10-CM

## 2020-01-20 DIAGNOSIS — F1320 Sedative, hypnotic or anxiolytic dependence, uncomplicated: Secondary | ICD-10-CM | POA: Diagnosis not present

## 2020-01-20 DIAGNOSIS — J45909 Unspecified asthma, uncomplicated: Secondary | ICD-10-CM

## 2020-01-20 DIAGNOSIS — K2100 Gastro-esophageal reflux disease with esophagitis, without bleeding: Secondary | ICD-10-CM

## 2020-01-20 DIAGNOSIS — E669 Obesity, unspecified: Secondary | ICD-10-CM

## 2020-01-20 DIAGNOSIS — Z23 Encounter for immunization: Secondary | ICD-10-CM | POA: Diagnosis not present

## 2020-01-20 DIAGNOSIS — Z79899 Other long term (current) drug therapy: Secondary | ICD-10-CM

## 2020-01-20 DIAGNOSIS — E538 Deficiency of other specified B group vitamins: Secondary | ICD-10-CM

## 2020-01-20 DIAGNOSIS — K219 Gastro-esophageal reflux disease without esophagitis: Secondary | ICD-10-CM

## 2020-01-20 DIAGNOSIS — N3281 Overactive bladder: Secondary | ICD-10-CM

## 2020-01-20 DIAGNOSIS — E1169 Type 2 diabetes mellitus with other specified complication: Secondary | ICD-10-CM

## 2020-01-20 DIAGNOSIS — E039 Hypothyroidism, unspecified: Secondary | ICD-10-CM

## 2020-01-20 DIAGNOSIS — E1159 Type 2 diabetes mellitus with other circulatory complications: Secondary | ICD-10-CM

## 2020-01-20 LAB — BAYER DCA HB A1C WAIVED: HB A1C (BAYER DCA - WAIVED): 6.4 % (ref <7.0)

## 2020-01-20 MED ORDER — DIAZEPAM 5 MG PO TABS: 5.0000 mg | ORAL_TABLET | Freq: Two times a day (BID) | ORAL | 2 refills | Status: DC

## 2020-01-20 MED ORDER — AMLODIPINE BESYLATE 10 MG PO TABS: ORAL_TABLET | ORAL | 0 refills | Status: DC

## 2020-01-20 MED ORDER — ESCITALOPRAM OXALATE 10 MG PO TABS: ORAL_TABLET | ORAL | 0 refills | Status: DC

## 2020-01-20 MED ORDER — HYDROXYZINE PAMOATE 25 MG PO CAPS: 25.0000 mg | ORAL_CAPSULE | Freq: Three times a day (TID) | ORAL | 1 refills | Status: DC | PRN

## 2020-01-20 MED ORDER — SITAGLIPTIN PHOSPHATE 100 MG PO TABS: ORAL_TABLET | ORAL | 0 refills | Status: DC

## 2020-01-20 MED ORDER — GABAPENTIN 300 MG PO CAPS: 300.0000 mg | ORAL_CAPSULE | Freq: Three times a day (TID) | ORAL | 2 refills | Status: DC

## 2020-01-20 MED ORDER — FLUTICASONE PROPIONATE 50 MCG/ACT NA SUSP: NASAL | 0 refills | Status: DC

## 2020-01-20 MED ORDER — MISOPROSTOL 200 MCG PO TABS: 200.0000 ug | ORAL_TABLET | Freq: Four times a day (QID) | ORAL | 2 refills | Status: DC

## 2020-01-20 MED ORDER — METOPROLOL TARTRATE 25 MG PO TABS: 25.0000 mg | ORAL_TABLET | Freq: Two times a day (BID) | ORAL | 2 refills | Status: DC

## 2020-01-20 MED ORDER — SOLIFENACIN SUCCINATE 10 MG PO TABS: 10.0000 mg | ORAL_TABLET | Freq: Every day | ORAL | 1 refills | Status: DC

## 2020-01-20 MED ORDER — OMEPRAZOLE 20 MG PO CPDR: DELAYED_RELEASE_CAPSULE | ORAL | 0 refills | Status: DC

## 2020-01-20 MED ORDER — METFORMIN HCL ER 500 MG PO TB24: ORAL_TABLET | ORAL | 0 refills | Status: DC

## 2020-01-20 NOTE — Patient Instructions (Signed)

## 2020-01-20 NOTE — Progress Notes (Signed)
Subjective:    Patient ID: Denise Gardner, female    DOB: [birth year], 74 y.o.   MRN: [id number]  Chief Complaint  Patient presents with  . Diabetes   Pt presents to the office today for chronic follow up.She is followed byCardiologists annually for CAD. She is followed by Hematologists  for iron deficiency anemia and Vit B 12 deficiency. Diabetes She presents for her follow-up diabetic visit. She has type 2 diabetes mellitus. Her disease course has been stable. Hypoglycemia symptoms include nervousness/anxiousness. Associated symptoms include fatigue and foot paresthesias. Pertinent negatives for diabetes include no blurred vision. Symptoms are stable. Diabetic complications include heart disease and peripheral neuropathy. Pertinent negatives for diabetic complications include no CVA or nephropathy. Risk factors for coronary artery disease include dyslipidemia, diabetes mellitus, hypertension, sedentary lifestyle and post-menopausal. She is following a generally healthy diet. Her overall blood glucose range is 110-130 mg/dl.  Hypertension This is a chronic problem. The current episode started more than 1 year ago. The problem has been waxing and waning since onset. The problem is uncontrolled. Associated symptoms include anxiety, malaise/fatigue and peripheral edema. Pertinent negatives include no blurred vision. Risk factors for coronary artery disease include dyslipidemia, diabetes mellitus, obesity and sedentary lifestyle. The current treatment provides moderate improvement. There is no history of CVA. Identifiable causes of hypertension include a thyroid problem.  Gastroesophageal Reflux She complains of belching, heartburn and a hoarse voice. She reports no coughing. This is a chronic problem. The current episode started more than 1 year ago. The problem occurs occasionally. The problem has been waxing and waning. Associated symptoms include fatigue. Risk factors include obesity. She has tried  a PPI for the symptoms. The treatment provided moderate relief.  Thyroid Problem Presents for follow-up visit. Symptoms include anxiety, fatigue and hoarse voice. Patient reports no depressed mood, diaphoresis or diarrhea. The symptoms have been stable. Her past medical history is significant for hyperlipidemia.  Hyperlipidemia This is a chronic problem. The current episode started more than 1 year ago. The problem is uncontrolled. Recent lipid tests were reviewed and are high. Exacerbating diseases include obesity. Current antihyperlipidemic treatment includes statins. The current treatment provides moderate improvement of lipids. Risk factors for coronary artery disease include dyslipidemia, diabetes mellitus, hypertension, a sedentary lifestyle and post-menopausal.  Anemia Presents for follow-up visit. Symptoms include malaise/fatigue. There has been no bruising/bleeding easily or fever.  Depression        This is a chronic problem.  The current episode started more than 1 year ago.   The onset quality is gradual.   The problem occurs intermittently.  The problem has been waxing and waning since onset.  Associated symptoms include fatigue, irritable, restlessness and sad.  Compliance with treatment is good.  Past medical history includes thyroid problem and anxiety.   Anxiety Presents for follow-up visit. Symptoms include excessive worry, irritability, nervous/anxious behavior and restlessness. Patient reports no depressed mood. Symptoms occur most days. The severity of symptoms is moderate. The quality of sleep is good.   Her past medical history is significant for anemia and asthma.  Asthma She complains of hoarse voice. There is no cough. This is a chronic problem. The current episode started more than 1 year ago. The problem occurs intermittently. Associated symptoms include heartburn and malaise/fatigue. Pertinent negatives include no fever. Her past medical history is significant for asthma.    Urinary Frequency  This is a chronic problem. The current episode started more than 1 year ago. The problem occurs every urination.  Associated symptoms include frequency and urgency. Pertinent negatives include no hematuria, hesitancy or vomiting. Treatments tried: myrbetriq. The treatment provided no relief.      Review of Systems  Constitutional: Positive for fatigue, irritability and malaise/fatigue. Negative for diaphoresis and fever.  HENT: Positive for hoarse voice.   Eyes: Negative for blurred vision.  Respiratory: Negative for cough.   Gastrointestinal: Positive for heartburn. Negative for diarrhea and vomiting.  Genitourinary: Positive for frequency and urgency. Negative for hematuria and hesitancy.  Hematological: Does not bruise/bleed easily.  Psychiatric/Behavioral: Positive for depression. The patient is nervous/anxious.   All other systems reviewed and are negative.      Objective:   Physical Exam Vitals reviewed.  Constitutional:      General: She is irritable. She is not in acute distress.    Appearance: She is well-developed.  HENT:     Head: Normocephalic and atraumatic.     Right Ear: Tympanic membrane normal.     Left Ear: Tympanic membrane normal.  Eyes:     Pupils: Pupils are equal, round, and reactive to light.  Neck:     Thyroid: No thyromegaly.  Cardiovascular:     Rate and Rhythm: Normal rate and regular rhythm.     Heart sounds: Normal heart sounds. No murmur heard.   Pulmonary:     Effort: Pulmonary effort is normal. No respiratory distress.     Breath sounds: Normal breath sounds. No wheezing.  Abdominal:     General: Bowel sounds are normal. There is no distension.     Palpations: Abdomen is soft.     Tenderness: There is no abdominal tenderness.  Musculoskeletal:        General: No tenderness. Normal range of motion.     Cervical back: Normal range of motion and neck supple.     Left lower leg: Edema (trace) present.  Skin:    General:  Skin is warm and dry.  Neurological:     Mental Status: She is alert and oriented to person, place, and time.     Cranial Nerves: No cranial nerve deficit.     Motor: Weakness present.     Gait: Gait abnormal.     Deep Tendon Reflexes: Reflexes are normal and symmetric.     Comments: Using cane to walk  Psychiatric:        Behavior: Behavior normal.        Thought Content: Thought content normal.        Judgment: Judgment normal.       BP 110/60 Comment: patient reported at home  Pulse 68   Temp 98 F (36.7 C) (Temporal)   Ht 5' 4.5" (1.638 m)   Wt 205 lb (93 kg)   BMI 34.64 kg/m      Assessment & Plan:  Lucine Bilski comes in today with chief complaint of Diabetes   Diagnosis and orders addressed:  1. Type 2 diabetes mellitus with diabetic polyneuropathy, without long-term current use of insulin (HCC) - Bayer DCA Hb A1c Waived - Microalbumin / creatinine urine ratio - sitaGLIPtin (JANUVIA) 100 MG tablet; TAKE 1 TABLET BY MOUTH EVERY DAY  Dispense: 90 tablet; Refill: 0 - metFORMIN (GLUCOPHAGE-XR) 500 MG 24 hr tablet; TAKE 2 TABLETS BY MOUTH  DAILY WITH BREAKFAST  Dispense: 180 tablet; Refill: 0 - CMP14+EGFR - CBC with Differential/Platelet  2. Allergic rhinitis - fluticasone (FLONASE) 50 MCG/ACT nasal spray; SHAKE LIQUID AND USE 2 SPRAYS IN EACH NOSTRIL DAILY  Dispense: 48 g; Refill: 0 -  CMP14+EGFR - CBC with Differential/Platelet   4. Anxiety - diazepam (VALIUM) 5 MG tablet; Take 1 tablet (5 mg total) by mouth 2 (two) times daily. Pt takes one tablet at daytime and one tablet at bedtime  Dispense: 45 tablet; Refill: 2 - gabapentin (NEURONTIN) 300 MG capsule; Take 1 capsule (300 mg total) by mouth 3 (three) times daily.  Dispense: 270 capsule; Refill: 2 - escitalopram (LEXAPRO) 10 MG tablet; TAKE 1 TABLET BY MOUTH  DAILY  Dispense: 90 tablet; Refill: 0 - CMP14+EGFR - CBC with Differential/Platelet  5. Benzodiazepine dependence (HCC) - diazepam (VALIUM) 5 MG  tablet; Take 1 tablet (5 mg total) by mouth 2 (two) times daily. Pt takes one tablet at daytime and one tablet at bedtime  Dispense: 45 tablet; Refill: 2 - CMP14+EGFR - CBC with Differential/Platelet  6. Depression - gabapentin (NEURONTIN) 300 MG capsule; Take 1 capsule (300 mg total) by mouth 3 (three) times daily.  Dispense: 270 capsule; Refill: 2 - escitalopram (LEXAPRO) 10 MG tablet; TAKE 1 TABLET BY MOUTH  DAILY  Dispense: 90 tablet; Refill: 0 - CMP14+EGFR - CBC with Differential/Platelet  7. Constipation, unspecified constipation type - misoprostol (CYTOTEC) 200 MCG tablet; Take 1 tablet (200 mcg total) by mouth 4 (four) times daily.  Dispense: 360 tablet; Refill: 2 - CMP14+EGFR - CBC with Differential/Platelet  9. Diabetes mellitus type 2 with neurological manifestations (HCC) - sitaGLIPtin (JANUVIA) 100 MG tablet; TAKE 1 TABLET BY MOUTH EVERY DAY  Dispense: 90 tablet; Refill: 0 - CMP14+EGFR - CBC with Differential/Platelet  10. Gastroesophageal reflux disease with esophagitis - omeprazole (PRILOSEC) 20 MG capsule; TAKE 1 CAPSULE BY MOUTH  TWICE DAILY BEFORE A MEAL  Dispense: 180 capsule; Refill: 0 - CMP14+EGFR - CBC with Differential/Platelet  11. Hypertension associated with diabetes ([place]) - metoprolol tartrate (LOPRESSOR) 25 MG tablet; Take 1 tablet (25 mg total) by mouth 2 (two) times daily.  Dispense: 180 tablet; Refill: 2 - amLODipine (NORVASC) 10 MG tablet; TAKE 1 TABLET BY MOUTH  DAILY  Dispense: 90 tablet; Refill: 0 - CMP14+EGFR - CBC with Differential/Platelet  12. Chronic coronary artery disease - CMP14+EGFR - CBC with Differential/Platelet  13. Uncomplicated asthma, unspecified asthma severity, unspecified whether persistent - CMP14+EGFR - CBC with Differential/Platelet  14. Chronic obstructive pulmonary disease, unspecified COPD type ([place]) - CMP14+EGFR - CBC with Differential/Platelet  15. Gastroesophageal reflux disease, unspecified whether esophagitis  present - CMP14+EGFR - CBC with Differential/Platelet  16. Hyperlipidemia associated with type 2 diabetes mellitus (HCC) - CMP14+EGFR - CBC with Differential/Platelet  17. Diabetic polyneuropathy associated with type 2 diabetes mellitus (HCC) - CMP14+EGFR - CBC with Differential/Platelet  18. Hypothyroidism, unspecified type - CMP14+EGFR - CBC with Differential/Platelet  19. Controlled substance agreement signed - CMP14+EGFR - CBC with Differential/Platelet  20. Iron deficiency anemia, unspecified iron deficiency anemia type - CMP14+EGFR - CBC with Differential/Platelet  21. Obesity (BMI 30-39.9) - CMP14+EGFR - CBC with Differential/Platelet  22. Vitamin B 12 deficiency - CMP14+EGFR - CBC with Differential/Platelet   22. OAB (overactive bladder) Stop myrbetriq and start [place] pending Patient reviewed in [place] controlled database, no flags noted. Contract and drug screen are up to date. Health Maintenance reviewed Diet and exercise encouraged  Follow up plan: 3 months    Evelina Dun, FNP

## 2020-01-21 ENCOUNTER — Other Ambulatory Visit: Payer: Self-pay | Admitting: Family

## 2020-01-21 LAB — CMP14+EGFR
ALT: 11 IU/L (ref 0–32)
AST: 15 IU/L (ref 0–40)
Albumin/Globulin Ratio: 1.9 (ref 1.2–2.2)
Albumin: 4.6 g/dL (ref 3.7–4.7)
Alkaline Phosphatase: 39 IU/L — ABNORMAL LOW (ref 44–121)
BUN/Creatinine Ratio: 16 (ref 12–28)
BUN: 20 mg/dL (ref 8–27)
Bilirubin Total: 0.4 mg/dL (ref 0.0–1.2)
CO2: 28 mmol/L (ref 20–29)
Calcium: 9.6 mg/dL (ref 8.7–10.3)
Chloride: 100 mmol/L (ref 96–106)
Creatinine, Ser: 1.22 mg/dL — ABNORMAL HIGH (ref 0.57–1.00)
GFR calc Af Amer: 50 mL/min/{1.73_m2} — ABNORMAL LOW (ref >59)
GFR calc non Af Amer: 43 mL/min/{1.73_m2} — ABNORMAL LOW (ref >59)
Globulin, Total: 2.4 g/dL (ref 1.5–4.5)
Glucose: 139 mg/dL — ABNORMAL HIGH (ref 65–99)
Potassium: 4.4 mmol/L (ref 3.5–5.2)
Sodium: 140 mmol/L (ref 134–144)
Total Protein: 7 g/dL (ref 6.0–8.5)

## 2020-01-21 LAB — CBC WITH DIFFERENTIAL/PLATELET
Basophils Absolute: 0.1 10*3/uL (ref 0.0–0.2)
Basos: 1 %
EOS (ABSOLUTE): 0.3 10*3/uL (ref 0.0–0.4)
Eos: 3 %
Hematocrit: 42.6 % (ref 34.0–46.6)
Hemoglobin: 14 g/dL (ref 11.1–15.9)
Immature Grans (Abs): 0 10*3/uL (ref 0.0–0.1)
Immature Granulocytes: 0 %
Lymphocytes Absolute: 2.8 10*3/uL (ref 0.7–3.1)
Lymphs: 31 %
MCH: 29.9 pg (ref 26.6–33.0)
MCHC: 32.9 g/dL (ref 31.5–35.7)
MCV: 91 fL (ref 79–97)
Monocytes Absolute: 0.8 10*3/uL (ref 0.1–0.9)
Monocytes: 9 %
Neutrophils Absolute: 5.1 10*3/uL (ref 1.4–7.0)
Neutrophils: 56 %
Platelets: 218 10*3/uL (ref 150–450)
RBC: 4.68 x10E6/uL (ref 3.77–5.28)
RDW: 12.7 % (ref 11.7–15.4)
WBC: 9.1 10*3/uL (ref 3.4–10.8)

## 2020-01-21 LAB — MICROALBUMIN / CREATININE URINE RATIO
Creatinine, Urine: 43.3 mg/dL
Microalb/Creat Ratio: <7 mg/g creat (ref 0–29)
Microalbumin, Urine: <3 ug/mL

## 2020-02-05 ENCOUNTER — Other Ambulatory Visit: Payer: Self-pay | Admitting: Family

## 2020-02-05 DIAGNOSIS — E1142 Type 2 diabetes mellitus with diabetic polyneuropathy: Secondary | ICD-10-CM

## 2020-02-22 ENCOUNTER — Other Ambulatory Visit: Payer: Self-pay | Admitting: Family

## 2020-02-22 DIAGNOSIS — J4520 Mild intermittent asthma, uncomplicated: Secondary | ICD-10-CM

## 2020-03-03 DIAGNOSIS — R072 Precordial pain: Secondary | ICD-10-CM | POA: Insufficient documentation

## 2020-03-03 NOTE — Progress Notes (Signed)
I    Cardiology Office Note   Date:  03/04/2020   ID:  Denise Gardner, DOB 09/20/1945, MRN [id number]  PCP:  Sharion Balloon, FNP  Cardiologist:   Minus Breeding, MD  Chief Complaint  Patient presents with  . Chest Pain      History of Present Illness: Denise Gardner is a 75 y.o. female who presents for evaluation of CAD.  I saw her last in 2014.  she had a stress perfusion study which demonstrated an EF of 71% and questionable inferior defect with questionable artifact vs infarct with mild periinfarct ischemia.    I last saw her in 2019. She had chest discomfort and on remarkable perfusion study with no evidence of ischemia. She had chest pain at the last appt and had a negative perfusion study.    She still gets chest discomfort.  She says she is taking a nitroglycerin about every day.  She does not seem to indicate that this is worse.  She is not necessarily taking 3 to 4/day and says she has not had to do this in quite a while.  She says she takes 1 nitroglycerin because of discomfort.  She goes to bed oftentimes.  She cannot bring this on.  She says it is somewhat different than her previous GI complaints and her sensitivities to food.  She does not describe it as associated with nausea vomiting or diaphoresis.  She does not have any new shortness of breath, PND or orthopnea.  Has had no weight gain or edema.   Past Medical History:  Diagnosis Date  . Anxiety    depression  . Asthma   . Cataract   . Chronic back pain   . Coronary artery disease    Taxus stent to RCA 2008 2.5 x 16, non obstructive 15% proximal right coronary artery, patent curcumflex LAD, preserved LV  . Diabetes mellitus without complication ([place])   . GERD (gastroesophageal reflux disease)   . Hyperlipidemia   . Hypertension   . Insomnia   . Migraine   . Neuropathy   . Shingles   . Thyroid disease   . Ulcer, stomach peptic     Past Surgical History:  Procedure Laterality Date  . TOTAL ABDOMINAL  HYSTERECTOMY       Current Outpatient Medications  Medication Sig Dispense Refill  . acetaminophen (TYLENOL) 500 MG tablet Take 500 mg by mouth 2 (two) times daily. Pt takes 2 tablets at bedtime    . acetaminophen (TYLENOL) 500 MG tablet Take 500 mg by mouth every 6 (six) hours as needed.    Marland Kitchen amLODipine (NORVASC) 10 MG tablet TAKE 1 TABLET BY MOUTH  DAILY 90 tablet 0  . Ascorbic Acid (VITAMIN C) 100 MG tablet Take 100 mg by mouth daily.    Marland Kitchen aspirin 81 MG chewable tablet Chew 81 mg by mouth daily.    . Biotin 1 MG CAPS Take by mouth.    . Calcium-Magnesium-Vitamin D (CALCIUM 1200+D3 PO) Take 1 capsule by mouth.    . cholecalciferol (VITAMIN D) 400 UNITS TABS Take by mouth. VITAMIN D3 daily     . cyanocobalamin 100 MCG tablet Take 100 mcg by mouth daily.    . diazepam (VALIUM) 5 MG tablet Take 1 tablet (5 mg total) by mouth 2 (two) times daily. Pt takes one tablet at daytime and one tablet at bedtime 45 tablet 2  . diphenhydrAMINE (BENADRYL) 25 mg capsule Take 25 mg by mouth every 6 (six) hours as  needed.    . docusate sodium (COLACE) 100 MG capsule Take 100 mg by mouth 2 (two) times daily.    Marland Kitchen escitalopram (LEXAPRO) 10 MG tablet TAKE 1 TABLET BY MOUTH  DAILY 90 tablet 0  . fluticasone (FLONASE) 50 MCG/ACT nasal spray SHAKE LIQUID AND USE 2 SPRAYS IN EACH NOSTRIL DAILY 48 g 0  . fluticasone (FLOVENT HFA) 110 MCG/ACT inhaler Inhale 2 puffs into the lungs 2 (two) times daily. 3 Inhaler 0  . gabapentin (NEURONTIN) 300 MG capsule Take 1 capsule (300 mg total) by mouth 3 (three) times daily. 270 capsule 2  . Garlic 10 MG CAPS Take by mouth.    . hydrOXYzine (VISTARIL) 25 MG capsule Take 1 capsule (25 mg total) by mouth 3 (three) times daily as needed. 90 capsule 1  . ipratropium-albuterol (DUONEB) 0.5-2.5 (3) MG/3ML SOLN USE 1 VIAL VIA NEBULIZER EVERY 6 HOURS 1080 mL 0  . Iron Combinations (CHROMAGEN) capsule Take 1 capsule by mouth daily.    . isosorbide mononitrate (IMDUR) 120 MG 24 hr tablet  Take 2 tablets (240 mg total) by mouth daily. 60 tablet 11  . levothyroxine (SYNTHROID) 88 MCG tablet TAKE 1 TABLET BY MOUTH  DAILY 90 tablet 0  . metFORMIN (GLUCOPHAGE-XR) 500 MG 24 hr tablet TAKE 2 TABLETS BY MOUTH  DAILY WITH BREAKFAST 180 tablet 0  . metoprolol tartrate (LOPRESSOR) 25 MG tablet Take 1 tablet (25 mg total) by mouth 2 (two) times daily. 180 tablet 2  . misoprostol (CYTOTEC) 200 MCG tablet Take 1 tablet (200 mcg total) by mouth 4 (four) times daily. 360 tablet 2  . montelukast (SINGULAIR) 10 MG tablet TAKE 1 TABLET(10 MG) BY MOUTH AT BEDTIME 90 tablet 0  . Multiple Vitamins-Minerals (HAIR SKIN AND NAILS FORMULA PO) Take 1 tablet by mouth daily.    . nitroGLYCERIN (NITROSTAT) 0.4 MG SL tablet DISSOLVE 1 TABLET UNDER THE TONGUE EVERY 5 MINUTES AS  NEEDED FOR CHEST PAIN. MAX  OF 3 TABLETS IN 15 MINUTES. CALL 911 IF PAIN PERSISTS. 100 tablet 3  . omeprazole (PRILOSEC) 20 MG capsule TAKE 1 CAPSULE BY MOUTH  TWICE DAILY BEFORE A MEAL 180 capsule 0  . raloxifene (EVISTA) 60 MG tablet TAKE 1 TABLET BY MOUTH  DAILY 90 tablet 0  . sitaGLIPtin (JANUVIA) 100 MG tablet TAKE 1 TABLET BY MOUTH EVERY DAY 90 tablet 0  . solifenacin (VESICARE) 10 MG tablet Take 1 tablet (10 mg total) by mouth daily. 90 tablet 1  . VASCEPA 1 g capsule TAKE 4 CAPSULES BY MOUTH EVERY DAY 360 capsule 0  . zinc gluconate 50 MG tablet Take 50 mg by mouth daily.    . Accu-Chek FastClix Lancets MISC CHECK BLOOD SUGAR UP TO FOUR TIMES DAILY Dx E11.69 408 each 3  . blood glucose meter kit and supplies Dispense based on patient and insurance preference. Use up to four times daily as directed. (FOR ICD-10 E10.9, E11.9). 1 each 0  . glucose blood (ONETOUCH ULTRA) test strip Test BS BID dx E11.9 200 strip 3  . psyllium (REGULOID) 0.52 g capsule Take 0.52 g by mouth daily. (Patient not taking: Reported on 03/04/2020)     No current facility-administered medications for this visit.    Allergies:   Milk-related compounds, Eggs or  egg-derived products, Lac bovis, Other, Statins, Crestor [rosuvastatin], and Ezetimibe   ROS:  Please see the history of present illness.   Otherwise, review of systems are positive multiple complaints of joint discomfort, falls weakness loss  of balance..   All other systems are reviewed and negative.    PHYSICAL EXAM: VS:  BP 110/80   Pulse 64   Ht '5\' 5"'  (1.651 m)   Wt 203 lb (92.1 kg)   BMI 33.78 kg/m  , BMI Body mass index is 33.78 kg/m. GENERAL:  Well appearing NECK:  No jugular venous distention, waveform within normal limits, carotid upstroke brisk and symmetric, no bruits, no thyromegaly LUNGS:  Clear to auscultation bilaterally CHEST:  Unremarkable HEART:  PMI not displaced or sustained,S1 and S2 within normal limits, no S3, no S4, no clicks, no rubs, no murmurs ABD:  Flat, positive bowel sounds normal in frequency in pitch, no bruits, no rebound, no guarding, no midline pulsatile mass, no hepatomegaly, no splenomegaly EXT:  2 plus pulses throughout, mild left leg edema, no cyanosis no clubbing   EKG:  EKG is not ordered today. NA  Recent Labs: 03/19/2019: TSH 8.000 01/20/2020: ALT 11; BUN 20; Creatinine, Ser 1.22; Hemoglobin 14.0; Platelets 218; Potassium 4.4; Sodium 140    Lipid Panel    Component Value Date/Time   CHOL 267 (H) 03/19/2019 1210   CHOL 136 09/05/2012 1256   TRIG 112 03/19/2019 1210   TRIG 43 12/28/2012 0938   TRIG 38 09/05/2012 1256   HDL 58 03/19/2019 1210   HDL 66 12/28/2012 0938   HDL 59 09/05/2012 1256   CHOLHDL 4.6 (H) 03/19/2019 1210   LDLCALC 189 (H) 03/19/2019 1210   LDLCALC 80 12/28/2012 0938   LDLCALC 69 09/05/2012 1256      Wt Readings from Last 3 Encounters:  03/04/20 203 lb (92.1 kg)  01/20/20 205 lb (93 kg)  12/25/19 200 lb (90.7 kg)      Other studies Reviewed: Additional studies/ records that were reviewed today include: Lexiscan Myoview. Review of the above records demonstrates:  Please see elsewhere in the note.      ASSESSMENT AND PLAN:  CHEST PAIN:     Her chest pain is somewhat atypical.  However, she is taking nitroglycerin every day.  My best assessment is that this is probably a stable nonanginal chest pain but to further manage her I am going to increase her to the maximum dose of mononitrate with Imdur 240 mg.  If she continues to have symptoms she might ultimately need cardiac cath.   HTN:  The blood pressure is at target.  No change in therapy.   DYSLIPIDEMIA:  Her LDL is not at target.  She did not tolerate statins and could not get lipid clinic 189 but she has not tolerated statins with muscle aches.  I would like to try to refer her to our lipid clinic to start   Current medicines are reviewed at length with the patient today.  The patient does not have concerns regarding medicines.  The following changes have been made:   As above  Labs/ tests ordered today include: None  No orders of the defined types were placed in this encounter.    Disposition:   FU with me in 2 months   Signed, Minus Breeding, MD  03/04/2020 4:05 PM    [facility]

## 2020-03-04 ENCOUNTER — Encounter: Payer: Self-pay | Admitting: Cardiology

## 2020-03-04 ENCOUNTER — Ambulatory Visit: Payer: Medicare Other | Admitting: Cardiology

## 2020-03-04 ENCOUNTER — Other Ambulatory Visit: Payer: Self-pay

## 2020-03-04 VITALS — BP 110/80 | HR 64 | Ht 65.0 in | Wt 203.0 lb

## 2020-03-04 DIAGNOSIS — R072 Precordial pain: Secondary | ICD-10-CM | POA: Diagnosis not present

## 2020-03-04 DIAGNOSIS — I10 Essential (primary) hypertension: Secondary | ICD-10-CM | POA: Diagnosis not present

## 2020-03-04 DIAGNOSIS — E785 Hyperlipidemia, unspecified: Secondary | ICD-10-CM | POA: Diagnosis not present

## 2020-03-04 MED ORDER — ISOSORBIDE MONONITRATE ER 120 MG PO TB24: 240.0000 mg | ORAL_TABLET | Freq: Every day | ORAL | 11 refills | Status: DC

## 2020-03-04 NOTE — Patient Instructions (Signed)
Medication Instructions:  Please increase Isosorbide to 120 mg (2) tablets a day.  Continue all other medications as listed.  *If you need a refill on your cardiac medications before your next appointment, please call your pharmacy*  Follow-Up: At [facility], you and your health needs are our priority.  As part of our continuing mission to provide you with exceptional heart care, we have created designated Provider Care Teams.  These Care Teams include your primary Cardiologist (physician) and Advanced Practice Providers (APPs -  Physician Assistants and Nurse Practitioners) who all work together to provide you with the care you need, when you need it.  We recommend signing up for the patient portal called "MyChart".  Sign up information is provided on this After Visit Summary.  MyChart is used to connect with patients for Virtual Visits (Telemedicine).  Patients are able to view lab/test results, encounter notes, upcoming appointments, etc.  Non-urgent messages can be sent to your provider as well.   To learn more about what you can do with MyChart, go to [email or web address].    Your next appointment:   2 month(s)  The format for your next appointment:   In Person  Provider:   Rollene Rotunda, MD  Thank you for choosing [facility]!!

## 2020-03-05 ENCOUNTER — Other Ambulatory Visit: Payer: Self-pay | Admitting: Family

## 2020-03-05 DIAGNOSIS — M25552 Pain in left hip: Secondary | ICD-10-CM

## 2020-03-05 DIAGNOSIS — M25551 Pain in right hip: Secondary | ICD-10-CM

## 2020-03-09 LAB — HM DIABETES EYE EXAM

## 2020-03-26 ENCOUNTER — Other Ambulatory Visit: Payer: Self-pay | Admitting: Family

## 2020-03-26 DIAGNOSIS — U071 COVID-19: Secondary | ICD-10-CM

## 2020-04-05 ENCOUNTER — Other Ambulatory Visit: Payer: Self-pay | Admitting: Family

## 2020-04-19 ENCOUNTER — Other Ambulatory Visit: Payer: Self-pay | Admitting: Family

## 2020-04-21 ENCOUNTER — Encounter: Payer: Self-pay | Admitting: Family

## 2020-04-21 ENCOUNTER — Other Ambulatory Visit: Payer: Self-pay

## 2020-04-21 ENCOUNTER — Ambulatory Visit (INDEPENDENT_AMBULATORY_CARE_PROVIDER_SITE_OTHER): Payer: Medicare Other | Admitting: Family

## 2020-04-21 VITALS — BP 127/77 | HR 68 | Temp 97.4°F | Ht 65.0 in | Wt 200.8 lb

## 2020-04-21 DIAGNOSIS — E039 Hypothyroidism, unspecified: Secondary | ICD-10-CM

## 2020-04-21 DIAGNOSIS — E1159 Type 2 diabetes mellitus with other circulatory complications: Secondary | ICD-10-CM

## 2020-04-21 DIAGNOSIS — J45909 Unspecified asthma, uncomplicated: Secondary | ICD-10-CM | POA: Diagnosis not present

## 2020-04-21 DIAGNOSIS — E1169 Type 2 diabetes mellitus with other specified complication: Secondary | ICD-10-CM

## 2020-04-21 DIAGNOSIS — K219 Gastro-esophageal reflux disease without esophagitis: Secondary | ICD-10-CM

## 2020-04-21 DIAGNOSIS — I2510 Atherosclerotic heart disease of native coronary artery without angina pectoris: Secondary | ICD-10-CM | POA: Diagnosis not present

## 2020-04-21 DIAGNOSIS — Z79899 Other long term (current) drug therapy: Secondary | ICD-10-CM

## 2020-04-21 DIAGNOSIS — J309 Allergic rhinitis, unspecified: Secondary | ICD-10-CM

## 2020-04-21 DIAGNOSIS — I152 Hypertension secondary to endocrine disorders: Secondary | ICD-10-CM | POA: Diagnosis not present

## 2020-04-21 DIAGNOSIS — D509 Iron deficiency anemia, unspecified: Secondary | ICD-10-CM

## 2020-04-21 DIAGNOSIS — E669 Obesity, unspecified: Secondary | ICD-10-CM

## 2020-04-21 DIAGNOSIS — F1320 Sedative, hypnotic or anxiolytic dependence, uncomplicated: Secondary | ICD-10-CM

## 2020-04-21 DIAGNOSIS — F331 Major depressive disorder, recurrent, moderate: Secondary | ICD-10-CM

## 2020-04-21 DIAGNOSIS — F419 Anxiety disorder, unspecified: Secondary | ICD-10-CM

## 2020-04-21 DIAGNOSIS — E785 Hyperlipidemia, unspecified: Secondary | ICD-10-CM

## 2020-04-21 DIAGNOSIS — E1142 Type 2 diabetes mellitus with diabetic polyneuropathy: Secondary | ICD-10-CM

## 2020-04-21 DIAGNOSIS — K5900 Constipation, unspecified: Secondary | ICD-10-CM

## 2020-04-21 DIAGNOSIS — E538 Deficiency of other specified B group vitamins: Secondary | ICD-10-CM

## 2020-04-21 DIAGNOSIS — J449 Chronic obstructive pulmonary disease, unspecified: Secondary | ICD-10-CM | POA: Diagnosis not present

## 2020-04-21 DIAGNOSIS — N3281 Overactive bladder: Secondary | ICD-10-CM | POA: Diagnosis not present

## 2020-04-21 DIAGNOSIS — F32A Depression, unspecified: Secondary | ICD-10-CM

## 2020-04-21 LAB — BAYER DCA HB A1C WAIVED: HB A1C (BAYER DCA - WAIVED): 6.7 % (ref <7.0)

## 2020-04-21 MED ORDER — HYDROXYZINE PAMOATE 25 MG PO CAPS
ORAL_CAPSULE | ORAL | 0 refills | Status: DC
Start: 2020-04-21 — End: 2020-05-20

## 2020-04-21 MED ORDER — ESCITALOPRAM OXALATE 10 MG PO TABS: ORAL_TABLET | ORAL | 0 refills | Status: DC

## 2020-04-21 MED ORDER — FLUTICASONE PROPIONATE 50 MCG/ACT NA SUSP
NASAL | 0 refills | Status: DC
Start: 2020-04-21 — End: 2020-06-03

## 2020-04-21 MED ORDER — MONTELUKAST SODIUM 10 MG PO TABS: ORAL_TABLET | ORAL | 3 refills | Status: DC

## 2020-04-21 MED ORDER — PREDNISONE 10 MG (21) PO TBPK: ORAL_TABLET | ORAL | 0 refills | Status: DC

## 2020-04-21 MED ORDER — DIAZEPAM 5 MG PO TABS: 5.0000 mg | ORAL_TABLET | Freq: Two times a day (BID) | ORAL | 2 refills | Status: DC

## 2020-04-21 NOTE — Progress Notes (Signed)
Subjective:    Patient ID: Denise Gardner, female    DOB: 04/12/1945, 75 y.o.   MRN: [id number]  Chief Complaint  Patient presents with  . Medical Management of Chronic Issues    Pt presents to the office today for chronic follow up.She is followed byCardiologists annually for CAD. She is followed by Hematologists for iron deficiency anemia and Vit B 12 deficiency. Hypertension This is a chronic problem. The current episode started more than 1 year ago. The problem has been resolved since onset. The problem is controlled. Associated symptoms include anxiety, malaise/fatigue and peripheral edema. Pertinent negatives include no blurred vision or shortness of breath. Risk factors for coronary artery disease include dyslipidemia, diabetes mellitus, obesity and sedentary lifestyle. The current treatment provides moderate improvement. Identifiable causes of hypertension include a thyroid problem.  Congestive Heart Failure Presents for follow-up visit. Associated symptoms include edema and fatigue. Pertinent negatives include no shortness of breath. The symptoms have been stable.  Gastroesophageal Reflux She complains of belching, coughing and heartburn. This is a chronic problem. The current episode started more than 1 year ago. The problem occurs occasionally. The problem has been waxing and waning. Associated symptoms include fatigue. Risk factors include obesity. She has tried a PPI for the symptoms.  Diabetes She presents for her follow-up diabetic visit. She has type 2 diabetes mellitus. Her disease course has been stable. Hypoglycemia symptoms include nervousness/anxiousness. Associated symptoms include fatigue. Pertinent negatives for diabetes include no blurred vision. Symptoms are stable. Diabetic complications include heart disease. Risk factors for coronary artery disease include dyslipidemia, obesity, hypertension, sedentary lifestyle and post-menopausal. Her dinner blood glucose range is  generally 130-140 mg/dl. Eye exam is current.  Thyroid Problem Presents for follow-up visit. Symptoms include anxiety, dry skin and fatigue. The symptoms have been stable. Her past medical history is significant for hyperlipidemia.  Hyperlipidemia This is a chronic problem. The current episode started more than 1 year ago. The problem is controlled. Exacerbating diseases include obesity. Pertinent negatives include no shortness of breath. Current antihyperlipidemic treatment includes herbal therapy. The current treatment provides mild improvement of lipids. Risk factors for coronary artery disease include dyslipidemia, diabetes mellitus, hypertension, a sedentary lifestyle and post-menopausal.  Urinary Frequency  This is a chronic problem. The current episode started more than 1 year ago. The problem has been waxing and waning. The patient is experiencing no pain. Associated symptoms include frequency.  Anxiety Presents for follow-up visit. Symptoms include excessive worry, irritability, nervous/anxious behavior and restlessness. Patient reports no shortness of breath. Symptoms occur occasionally. The severity of symptoms is moderate.   Her past medical history is significant for anemia and asthma.  Anemia Presents for follow-up visit. Symptoms include malaise/fatigue.  Asthma She complains of cough. There is no shortness of breath. This is a chronic problem. The current episode started more than 1 year ago. The problem occurs intermittently. Associated symptoms include heartburn and malaise/fatigue. Her past medical history is significant for asthma.  COPD States her breathing is stable.      Review of Systems  Constitutional: Positive for fatigue, irritability and malaise/fatigue.  Eyes: Negative for blurred vision.  Respiratory: Positive for cough. Negative for shortness of breath.   Gastrointestinal: Positive for heartburn.  Genitourinary: Positive for frequency.   Psychiatric/Behavioral: The patient is nervous/anxious.   All other systems reviewed and are negative.      Objective:   Physical Exam Vitals reviewed.  Constitutional:      General: She is not in acute distress.  Appearance: She is well-developed and well-nourished. She is obese.  HENT:     Head: Normocephalic and atraumatic.     Right Ear: Tympanic membrane normal.     Left Ear: Tympanic membrane normal.     Mouth/Throat:     Mouth: Oropharynx is clear and moist.  Eyes:     Pupils: Pupils are equal, round, and reactive to light.  Neck:     Thyroid: No thyromegaly.  Cardiovascular:     Rate and Rhythm: Normal rate and regular rhythm.     Pulses: Intact distal pulses.     Heart sounds: Normal heart sounds. No murmur heard.   Pulmonary:     Effort: Pulmonary effort is normal. No respiratory distress.     Breath sounds: Wheezing present.  Abdominal:     General: Bowel sounds are normal. There is no distension.     Palpations: Abdomen is soft.     Tenderness: There is no abdominal tenderness.  Musculoskeletal:        General: No tenderness or edema. Normal range of motion.     Cervical back: Normal range of motion and neck supple.  Skin:    General: Skin is warm and dry.  Neurological:     Mental Status: She is alert and oriented to person, place, and time.     Cranial Nerves: No cranial nerve deficit.     Motor: Weakness present.     Deep Tendon Reflexes: Reflexes are normal and symmetric.  Psychiatric:        Mood and Affect: Mood and affect normal.        Behavior: Behavior normal.        Thought Content: Thought content normal.        Judgment: Judgment normal.          BP 127/77   Pulse 68   Temp (!) 97.4 F (36.3 C) (Temporal)   Ht '5\' 5"'  (1.651 m)   Wt 200 lb 12.8 oz (91.1 kg)   SpO2 96%   BMI 33.41 kg/m   Assessment & Plan:  Denise Gardner comes in today with chief complaint of Medical Management of Chronic Issues   Diagnosis and orders  addressed:  1. Hypertension associated with diabetes ([place]) - CMP14+EGFR - CBC with Differential/Platelet - AMB Referral to [place]  2. Chronic coronary artery disease - CMP14+EGFR - CBC with Differential/Platelet - AMB Referral to [place]  3. Uncomplicated asthma, unspecified asthma severity, unspecified whether persistent - CMP14+EGFR - CBC with Differential/Platelet - AMB Referral to [place] - predniSONE (STERAPRED UNI-PAK 21 TAB) 10 MG (21) TBPK tablet; Use as directed  Dispense: 21 tablet; Refill: 0  4. Chronic obstructive pulmonary disease, unspecified COPD type ([place]) - CMP14+EGFR - CBC with Differential/Platelet - AMB Referral to [place]  5. Gastroesophageal reflux disease, unspecified whether esophagitis present - CMP14+EGFR - CBC with Differential/Platelet - AMB Referral to [place]  6. Hyperlipidemia associated with type 2 diabetes mellitus ([place]) - CMP14+EGFR - CBC with Differential/Platelet - AMB Referral to [place]  7. Type 2 diabetes mellitus with diabetic polyneuropathy, without long-term current use of insulin (HCC) - CMP14+EGFR - CBC with Differential/Platelet - Bayer DCA Hb A1c Waived - AMB Referral to [facility]  8. Hypothyroidism, unspecified type - CMP14+EGFR - CBC with Differential/Platelet - TSH - AMB Referral to [place]  9. OAB (overactive bladder) - CMP14+EGFR - CBC with Differential/Platelet - AMB Referral to [employer] [facility] [facility]  Care Coordinaton  10. Anxiety - diazepam (VALIUM) 5 MG tablet; Take 1 tablet (5 mg total) by mouth 2 (two) times daily. Pt takes one tablet at daytime and one tablet at bedtime  Dispense: 45 tablet; Refill: 2 - escitalopram (LEXAPRO) 10 MG tablet; TAKE 1 TABLET BY MOUTH  DAILY  Dispense: 90 tablet; Refill: 0 - CMP14+EGFR - CBC with Differential/Platelet - AMB Referral to  [place]  11. Moderate episode of recurrent major depressive disorder (HCC) - CMP14+EGFR - CBC with Differential/Platelet - AMB Referral to [place]  12. Obesity (BMI 30-39.9) - CMP14+EGFR - CBC with Differential/Platelet - AMB Referral to [facility]  13. Constipation, unspecified constipation type  - CMP14+EGFR - CBC with Differential/Platelet - AMB Referral to [facility]  14. Iron deficiency anemia, unspecified iron deficiency anemia type - CMP14+EGFR - CBC with Differential/Platelet - AMB Referral to [facility]  15. Vitamin B 12 deficiency - CMP14+EGFR - CBC with Differential/Platelet - AMB Referral to [place]  16. Controlled substance agreement signed - CMP14+EGFR - CBC with Differential/Platelet - AMB Referral to [facility]  17. Benzodiazepine dependence (HCC) - diazepam (VALIUM) 5 MG tablet; Take 1 tablet (5 mg total) by mouth 2 (two) times daily. Pt takes one tablet at daytime and one tablet at bedtime  Dispense: 45 tablet; Refill: 2 - CMP14+EGFR - CBC with Differential/Platelet - AMB Referral to [facility]  18. Depression - escitalopram (LEXAPRO) 10 MG tablet; TAKE 1 TABLET BY MOUTH  DAILY  Dispense: 90 tablet; Refill: 0 - CMP14+EGFR - CBC with Differential/Platelet - AMB Referral to [place] [facility]  19. Allergic rhinitis  - fluticasone (FLONASE) 50 MCG/ACT nasal spray; SHAKE LIQUID AND USE 2 SPRAYS IN EACH NOSTRIL DAILY  Dispense: 48 g; Refill: 0 - CMP14+EGFR - CBC with Differential/Platelet - AMB Referral to [place] pending Health Maintenance reviewed Diet and exercise encouraged  Follow up plan: 3 months    Evelina Dun, FNP

## 2020-04-21 NOTE — Patient Instructions (Signed)
http://www.aaaai.org/conditions-and-treatments/asthma">  Asthma, Adult  Asthma is a long-term (chronic) condition that causes recurrent episodes in which the airways become tight and narrow. The airways are the passages that lead from the nose and mouth down into the lungs. Asthma episodes, also called asthma attacks, can cause coughing, wheezing, shortness of breath, and chest pain. The airways can also fill with mucus. During an attack, it can be difficult to breathe. Asthma attacks can range from minor to life threatening. Asthma cannot be cured, but medicines and lifestyle changes can help control it and treat acute attacks. What are the causes? This condition is believed to be caused by inherited (genetic) and environmental factors, but its exact cause is not known. There are many things that can bring on an asthma attack or make asthma symptoms worse (triggers). Asthma triggers are different for each person. Common triggers include:  Mold.  Dust.  Cigarette smoke.  Cockroaches.  Things that can cause allergy symptoms (allergens), such as animal dander or pollen from trees or grass.  Air pollutants such as household cleaners, wood smoke, smog, or chemical odors.  Cold air, weather changes, and winds (which increase molds and pollen in the air).  Strong emotional expressions such as crying or laughing hard.  Stress.  Certain medicines (such as aspirin) or types of medicines (such as beta-blockers).  Sulfites in foods and drinks. Foods and drinks that may contain sulfites include dried fruit, potato chips, and sparkling grape juice.  Infections or inflammatory conditions such as the flu, a cold, or inflammation of the nasal membranes (rhinitis).  Gastroesophageal reflux disease (GERD).  Exercise or strenuous activity. What are the signs or symptoms? Symptoms of this condition may occur right after asthma is triggered or many hours later. Symptoms include:  Wheezing. This can  sound like whistling when you breathe.  Excessive nighttime or early morning coughing.  Frequent or severe coughing with a common cold.  Chest tightness.  Shortness of breath.  Tiredness (fatigue) with minimal activity. How is this diagnosed? This condition is diagnosed based on:  Your medical history.  A physical exam.  Tests, which may include: ? Lung function studies and pulmonary studies (spirometry). These tests can evaluate the flow of air in your lungs. ? Allergy tests. ? Imaging tests, such as X-rays. How is this treated? There is no cure for this condition, but treatment can help control your symptoms. Treatment for asthma usually involves:  Identifying and avoiding your asthma triggers.  Using medicines to control your symptoms. Generally, two types of medicines are used to treat asthma: ? Controller medicines. These help prevent asthma symptoms from occurring. They are usually taken every day. ? Fast-acting reliever or rescue medicines. These quickly relieve asthma symptoms by widening the narrow and tight airways. They are used as needed and provide short-term relief.  Using supplemental oxygen. This may be needed during a severe episode.  Using other medicines, such as: ? Allergy medicines, such as antihistamines, if your asthma attacks are triggered by allergens. ? Immune medicines (immunomodulators). These are medicines that help control the immune system.  Creating an asthma action plan. An asthma action plan is a written plan for managing and treating your asthma attacks. This plan includes: ? A list of your asthma triggers and how to avoid them. ? Information about when medicines should be taken and when their dosage should be changed. ? Instructions about using a device called a peak flow meter. A peak flow meter measures how well the lungs are working   and the severity of your asthma. It helps you monitor your condition. Follow these instructions at  home: Controlling your home environment Control your home environment in the following ways to help avoid triggers and prevent asthma attacks:  Change your heating and air conditioning filter regularly.  Limit your use of fireplaces and wood stoves.  Get rid of pests (such as roaches and mice) and their droppings.  Throw away plants if you see mold on them.  Clean floors and dust surfaces regularly. Use unscented cleaning products.  Try to have someone else vacuum for you regularly. Stay out of rooms while they are being vacuumed and for a short while afterward. If you vacuum, use a dust mask from a hardware store, a double-layered or microfilter vacuum cleaner bag, or a vacuum cleaner with a HEPA filter.  Replace carpet with wood, tile, or vinyl flooring. Carpet can trap dander and dust.  Use allergy-proof pillows, mattress covers, and box spring covers.  Keep your bedroom a trigger-free room.  Avoid pets and keep windows closed when allergens are in the air.  Wash beddings every week in hot water and dry them in a dryer.  Use blankets that are made of polyester or cotton.  Clean bathrooms and kitchens with bleach. If possible, have someone repaint the walls in these rooms with mold-resistant paint. Stay out of the rooms that are being cleaned and painted.  Wash your hands often with soap and water. If soap and water are not available, use hand sanitizer.  Do not allow anyone to smoke in your home. General instructions  Take over-the-counter and prescription medicines only as told by your health care provider. ? Speak with your health care provider if you have questions about how or when to take the medicines. ? Make note if you are requiring more frequent dosages.  Do not use any products that contain nicotine or tobacco, such as cigarettes and e-cigarettes. If you need help quitting, ask your health care provider. Also, avoid being exposed to secondhand smoke.  Use a peak  flow meter as told by your health care provider. Record and keep track of the readings.  Understand and use the asthma action plan to help minimize, or stop an asthma attack, without needing to seek medical care.  Make sure you stay up to date on your yearly vaccinations as told by your health care provider. This may include vaccines for the flu and pneumonia.  Avoid outdoor activities when allergen counts are high and when air quality is low.  Wear a ski mask that covers your nose and mouth during outdoor winter activities. Exercise indoors on cold days if you can.  Warm up before exercising, and take time for a cool-down period after exercise.  Keep all follow-up visits as told by your health care provider. This is important. Where to find more information  For information about asthma, turn to the Centers for Disease Control and Prevention at http://www.clark.net/  For air quality information, turn to [employer] at https://www.miller-reyes.info/ Contact a health care provider if:  You have wheezing, shortness of breath, or a cough even while you are taking medicine to prevent attacks.  The mucus you cough up (sputum) is thicker than usual.  Your sputum changes from clear or white to yellow, green, gray, or bloody.  Your medicines are causing side effects, such as a rash, itching, swelling, or trouble breathing.  You need to use a reliever medicine more than 2-3 times a week.  Your peak  flow reading is still at 50-79% of your personal best after following your action plan for 1 hour.  You have a fever. Get help right away if:  You are getting worse and do not respond to treatment during an asthma attack.  You are short of breath when at rest or when doing very little physical activity.  You have difficulty eating, drinking, or talking.  You have chest pain or tightness.  You develop a fast heartbeat or palpitations.  You have a bluish color to your lips or fingernails.  You are  light-headed or dizzy, or you faint.  Your peak flow reading is less than 50% of your personal best.  You feel too tired to breathe normally. Summary  Asthma is a long-term (chronic) condition that causes recurrent episodes in which the airways become tight and narrow. These episodes can cause coughing, wheezing, shortness of breath, and chest pain.  Asthma cannot be cured, but medicines and lifestyle changes can help control it and treat acute attacks.  Make sure you understand how to avoid triggers and how and when to use your medicines.  Asthma attacks can range from minor to life threatening. Get help right away if you have an asthma attack and do not respond to treatment with your usual rescue medicines. This information is not intended to replace advice given to you by your health care provider. Make sure you discuss any questions you have with your health care provider. Document Revised: 11/15/2019 Document Reviewed: 06/19/2019 Elsevier Patient Education  2021 [employer].

## 2020-04-22 LAB — CMP14+EGFR
ALT: 12 IU/L (ref 0–32)
AST: 15 IU/L (ref 0–40)
Albumin/Globulin Ratio: 1.5 (ref 1.2–2.2)
Albumin: 4.1 g/dL (ref 3.7–4.7)
Alkaline Phosphatase: 43 IU/L — ABNORMAL LOW (ref 44–121)
BUN/Creatinine Ratio: 17 (ref 12–28)
BUN: 23 mg/dL (ref 8–27)
Bilirubin Total: 0.4 mg/dL (ref 0.0–1.2)
CO2: 27 mmol/L (ref 20–29)
Calcium: 10.4 mg/dL — ABNORMAL HIGH (ref 8.7–10.3)
Chloride: 100 mmol/L (ref 96–106)
Creatinine, Ser: 1.37 mg/dL — ABNORMAL HIGH (ref 0.57–1.00)
GFR calc Af Amer: 43 mL/min/{1.73_m2} — ABNORMAL LOW (ref >59)
GFR calc non Af Amer: 37 mL/min/{1.73_m2} — ABNORMAL LOW (ref >59)
Globulin, Total: 2.7 g/dL (ref 1.5–4.5)
Glucose: 129 mg/dL — ABNORMAL HIGH (ref 65–99)
Potassium: 4.8 mmol/L (ref 3.5–5.2)
Sodium: 142 mmol/L (ref 134–144)
Total Protein: 6.8 g/dL (ref 6.0–8.5)

## 2020-04-22 LAB — CBC WITH DIFFERENTIAL/PLATELET
Basophils Absolute: 0.1 10*3/uL (ref 0.0–0.2)
Basos: 1 %
EOS (ABSOLUTE): 0.6 10*3/uL — ABNORMAL HIGH (ref 0.0–0.4)
Eos: 5 %
Hematocrit: 45.1 % (ref 34.0–46.6)
Hemoglobin: 14.9 g/dL (ref 11.1–15.9)
Immature Grans (Abs): 0 10*3/uL (ref 0.0–0.1)
Immature Granulocytes: 0 %
Lymphocytes Absolute: 3.7 10*3/uL — ABNORMAL HIGH (ref 0.7–3.1)
Lymphs: 32 %
MCH: 29.8 pg (ref 26.6–33.0)
MCHC: 33 g/dL (ref 31.5–35.7)
MCV: 90 fL (ref 79–97)
Monocytes Absolute: 0.9 10*3/uL (ref 0.1–0.9)
Monocytes: 7 %
Neutrophils Absolute: 6.6 10*3/uL (ref 1.4–7.0)
Neutrophils: 55 %
Platelets: 222 10*3/uL (ref 150–450)
RBC: 5 x10E6/uL (ref 3.77–5.28)
RDW: 13 % (ref 11.7–15.4)
WBC: 11.8 10*3/uL — ABNORMAL HIGH (ref 3.4–10.8)

## 2020-04-22 LAB — TSH: TSH: 7.8 u[IU]/mL — ABNORMAL HIGH (ref 0.450–4.500)

## 2020-04-23 ENCOUNTER — Other Ambulatory Visit: Payer: Self-pay | Admitting: Family

## 2020-04-23 MED ORDER — LEVOTHYROXINE SODIUM 100 MCG PO TABS: 100.0000 ug | ORAL_TABLET | Freq: Every day | ORAL | 1 refills | Status: DC

## 2020-04-25 DIAGNOSIS — E1165 Type 2 diabetes mellitus with hyperglycemia: Secondary | ICD-10-CM | POA: Diagnosis not present

## 2020-04-25 DIAGNOSIS — Z8616 Personal history of COVID-19: Secondary | ICD-10-CM | POA: Diagnosis not present

## 2020-04-25 DIAGNOSIS — E1122 Type 2 diabetes mellitus with diabetic chronic kidney disease: Secondary | ICD-10-CM | POA: Diagnosis not present

## 2020-04-25 DIAGNOSIS — E1142 Type 2 diabetes mellitus with diabetic polyneuropathy: Secondary | ICD-10-CM | POA: Diagnosis not present

## 2020-04-25 DIAGNOSIS — D509 Iron deficiency anemia, unspecified: Secondary | ICD-10-CM | POA: Diagnosis not present

## 2020-04-25 DIAGNOSIS — I129 Hypertensive chronic kidney disease with stage 1 through stage 4 chronic kidney disease, or unspecified chronic kidney disease: Secondary | ICD-10-CM | POA: Diagnosis not present

## 2020-04-25 DIAGNOSIS — N189 Chronic kidney disease, unspecified: Secondary | ICD-10-CM | POA: Diagnosis not present

## 2020-04-25 DIAGNOSIS — E785 Hyperlipidemia, unspecified: Secondary | ICD-10-CM | POA: Diagnosis not present

## 2020-04-25 DIAGNOSIS — J441 Chronic obstructive pulmonary disease with (acute) exacerbation: Secondary | ICD-10-CM | POA: Diagnosis not present

## 2020-04-25 DIAGNOSIS — R42 Dizziness and giddiness: Secondary | ICD-10-CM | POA: Diagnosis not present

## 2020-04-25 DIAGNOSIS — E039 Hypothyroidism, unspecified: Secondary | ICD-10-CM | POA: Diagnosis not present

## 2020-04-25 DIAGNOSIS — I2510 Atherosclerotic heart disease of native coronary artery without angina pectoris: Secondary | ICD-10-CM | POA: Diagnosis not present

## 2020-04-25 DIAGNOSIS — K219 Gastro-esophageal reflux disease without esophagitis: Secondary | ICD-10-CM | POA: Diagnosis not present

## 2020-04-25 DIAGNOSIS — Z7984 Long term (current) use of oral hypoglycemic drugs: Secondary | ICD-10-CM | POA: Diagnosis not present

## 2020-05-01 ENCOUNTER — Telehealth: Payer: Self-pay | Admitting: *Deleted

## 2020-05-01 NOTE — Chronic Care Management (AMB) (Signed)
  Chronic Care Management   Note  05/01/2020 Name: Denise Gardner MRN: [id number] DOB: 1945/05/18  [name] is a 75 y.o. year old female who is a primary care patient of Sharion Balloon, FNP. I reached out to [name] by phone today in response to a referral sent by [name] PCP, Sharion Balloon, FNP     [name] was given information about Chronic Care Management services today including:  1. CCM service includes personalized support from designated clinical staff supervised by her physician, including individualized plan of care and coordination with other care providers 2. 24/7 contact phone numbers for assistance for urgent and routine care needs. 3. Service will only be billed when office clinical staff spend 20 minutes or more in a month to coordinate care. 4. Only one practitioner may furnish and bill the service in a calendar month. 5. The patient may stop CCM services at any time (effective at the end of the month) by phone call to the office staff. 6. The patient will be responsible for cost sharing (co-pay) of up to 20% of the service fee (after annual deductible is met).  Patient agreed to services and verbal consent obtained.   Follow up plan: Telephone appointment with care management team member scheduled for:05/12/2020  Portal Management

## 2020-05-05 ENCOUNTER — Other Ambulatory Visit: Payer: Self-pay | Admitting: Family

## 2020-05-05 DIAGNOSIS — E1142 Type 2 diabetes mellitus with diabetic polyneuropathy: Secondary | ICD-10-CM

## 2020-05-09 ENCOUNTER — Other Ambulatory Visit: Payer: Self-pay | Admitting: Family

## 2020-05-09 DIAGNOSIS — E1159 Type 2 diabetes mellitus with other circulatory complications: Secondary | ICD-10-CM

## 2020-05-09 DIAGNOSIS — I152 Hypertension secondary to endocrine disorders: Secondary | ICD-10-CM

## 2020-05-12 ENCOUNTER — Telehealth: Payer: Self-pay | Admitting: Family Medicine

## 2020-05-12 ENCOUNTER — Telehealth: Payer: Self-pay | Admitting: *Deleted

## 2020-05-12 ENCOUNTER — Ambulatory Visit (INDEPENDENT_AMBULATORY_CARE_PROVIDER_SITE_OTHER): Payer: Medicare Other | Admitting: *Deleted

## 2020-05-12 DIAGNOSIS — E1142 Type 2 diabetes mellitus with diabetic polyneuropathy: Secondary | ICD-10-CM | POA: Diagnosis not present

## 2020-05-12 DIAGNOSIS — E1159 Type 2 diabetes mellitus with other circulatory complications: Secondary | ICD-10-CM | POA: Diagnosis not present

## 2020-05-12 DIAGNOSIS — I152 Hypertension secondary to endocrine disorders: Secondary | ICD-10-CM

## 2020-05-12 DIAGNOSIS — E039 Hypothyroidism, unspecified: Secondary | ICD-10-CM | POA: Diagnosis not present

## 2020-05-12 NOTE — Telephone Encounter (Signed)
05/12/2020  Taking naproxen 500mg  BID for joint pain even though it was d/c in Nov 2021 due to elevated kidney functions. Per patient, she wasn't aware that she was supposed to stop taking it. She does have some confusion over her medications and we talked about them extensively today. She is taking acetaminophen at bedtime.   I added naproxen back to med list as a patient reported medication for accuracy since she is taking it. I did advise to d/c.   Will forward to PCP for recommendation on what patient can do for pain management and to stress need to d/c naproxen. Next PCP visit is scheduled for 07/20/20.   Chong Sicilian, BSN, RN-BC Embedded Chronic Care Manager [facility] Direct Dial: [phone]

## 2020-05-12 NOTE — Telephone Encounter (Signed)
05/12/2020  Spoke with patient and pharmacy did not fill the levothyroxine 142mcg. She is still taking 37mcg. She will start that ASAP and just wait and have labs drawn at appt with Christy on 07/20/20.  Chong Sicilian, BSN, RN-BC Embedded Chronic Care Manager [facility] Direct Dial: [phone]

## 2020-05-12 NOTE — Patient Instructions (Signed)
Visit Information  PATIENT GOALS:  Goals Addressed            This Visit's Progress   . Manage My Medicine   Not on track    Timeframe:  Long-Range Goal Priority:  High Start Date:   05/12/20                          Expected End Date: 02/27/21                      Follow Up Date 05/26/20    . Start taking levothyroxine 100 mcg . Take levothyroxine on an empty stomach 30 minutes before breakfast and other medications . Do not take levothyroxine with omeprazole . Take omeprazole 30 minutes before dinner or 30 min before lunch and 30 min before dinner if needed . Stop taking naproxen . Talk with PCP about joint pain control since you're stopping naproxen . Call RN Care Manager with any questions about your medications    Why is this important?   . These steps will help you keep on track with your medicines.   Notes:     Marland Kitchen Monitor and Manage My Blood Sugar-Diabetes Type 2   On track    Timeframe:  Long-Range Goal Priority:  Medium Start Date:  05/12/20                           Expected End Date:  02/27/21                      Follow Up Date 05/26/20   . Keep appointment with PCP on 07/20/20 . Call PCP with any blood sugar readings outside of recommended range . Continue to avoid sugars and simple carbohydrates . Take Januvia as directed . Take blood sugar log and meter to PCP appointment . Call RNCM as needed    Why is this important?    Checking your blood sugar at home helps to keep it from getting very high or very low.   Writing the results in a diary or log helps the doctor know how to care for you.   Your blood sugar log should have the time, date and the results.   Also, write down the amount of insulin or other medicine that you take.   Other information, like what you ate, exercise done and how you were feeling, will also be helpful.     Notes:        Consent to CCM Services: Denise Gardner was given information about Chronic Care Management services today  including:  1. CCM service includes personalized support from designated clinical staff supervised by her physician, including individualized plan of care and coordination with other care providers 2. 24/7 contact phone numbers for assistance for urgent and routine care needs. 3. Service will only be billed when office clinical staff spend 20 minutes or more in a month to coordinate care. 4. Only one practitioner may furnish and bill the service in a calendar month. 5. The patient may stop CCM services at any time (effective at the end of the month) by phone call to the office staff. 6. The patient will be responsible for cost sharing (co-pay) of up to 20% of the service fee (after annual deductible is met).  Patient agreed to services and verbal consent obtained.   Patient verbalizes understanding of instructions provided today  and agrees to view in [place].    Follow Up Plan:  . Telephone follow up appointment with care management team member scheduled for: 05/26/20 with RNCM . The patient has been provided with contact information for the care management team and has been advised to call with any health related questions or concerns.  . Next PCP appointment scheduled for: 07/20/20 with Evelina Dun, FNP  Chong Sicilian, BSN, RN-BC Embedded Chronic Care Manager [facility] [place] [facility] Direct Dial: [phone]   CLINICAL CARE PLAN: Patient Care Plan: RNCM: Wellness (Adult)    Problem Identified: Medication Adherence (Wellness)   Priority: High    Long-Range Goal: Medication Adherence Maintained   Start Date: 05/12/2020  This Visit's Progress: Not on track  Priority: High  Note:   Current Barriers:  Marland Kitchen Knowledge Deficits related to medications and what they are used to treat . Lacks caregiver support.  . Film/video editor.  . Unable to independently drive . Has incorrect dose of levothyroxine . Taking medication that has been  discontinued  Nurse Case Manager Clinical Goal(s):  . patient will verbalize understanding of plan for medication management . patient will meet with RN Care Manager to address questions and needs related to medication management . Patient will take prescribed medication as directed and will stop taking discontinued medications  Interventions:  . 1:1 collaboration with Sharion Balloon, FNP regarding development and update of comprehensive plan of care as evidenced by provider attestation and co-signature . Inter-disciplinary care team collaboration (see longitudinal plan of care) . Chart reviewed including recent office notes and lab results . Reviewed and extensively discussed medications with patient o Compared medication list in [facility] with patient's prescription bottles at home o Updated medication list to reflect what patient is actually taking o Discussed change in levothyroxine from 49mg to 1027m at last office visit on 04/21/20 due to elevated TSH - Patient is still taking 88 mcg and did not pick up 100 mcg from [facility] with her other meds a few days ago . Discussed medication organization  o Patient uses a weekly pill box to organize meds . Outreached to [facility] at [phone] left a message o Advised that new script had been sent in but patient has not picked it up o Advised that patient will pick it up next week when she has transportation o Asked that they call me back if they do not have 100 mcg on file for patient . [facility] to order repeat TSH for 06/19/20 to rck level after 8 weeks of increased levothyroxine dose o Documented that patient has not started the increased dose and that she will repeat labs at next office visit on 07/20/20.  o Patient made aware . Discussed use of Naproxen 500 mg o Patient is taking this twice a day for joint pain o Informed that NSAIDs are not recommended for patients with diabetes and  CKD - Patient was unaware o Reviewed lab notes and PCP documented to d/c naproxen on 01/20/21 o Patient advised to stop now and talk with PCP about other joint pain management options . Telephone message sent to PCP advising of NSAID use and requesting other recommendations for pain control since she is also taking tylenol . Discussed use of omeprazole and levothyroxine o Advised to take levothyroxine 30 minutes before breakfast and any other medications, especially omeprazole o Often takes omeprazole once a day, in the morning - Recommended she take it 30  minutes before dinner - If she needs to take it twice a day, can take it 30 min before lunch and 30 minutes before supper . Provided with RNCM contact number and encouraged to reach out as needed . Reviewed upcoming appointment with PCP on 07/20/20  Patient Goals/Self-Care Activities Over the next 30 days, patient will: . Start taking levothyroxine 100 mcg . Take levothyroxine on an empty stomach 30 minutes before breakfast and other medications . Do not take levothyroxine with omeprazole . Take omeprazole 30 minutes before dinner or 30 min before lunch and 30 min before dinner if needed . Stop taking naproxen . Talk with PCP about joint pain control since you're stopping naproxen . Call [place] with any questions about your medications   Follow Up Plan:  . Telephone follow up appointment with care management team member scheduled for: 05/26/20 with RNCM . The patient has been provided with contact information for the care management team and has been advised to call with any health related questions or concerns.  . Next PCP appointment scheduled for: 07/20/20 with Evelina Dun, FNP        Patient Care Plan: RNCM:Diabetes Type 2 (Adult)    Problem Identified: RNCM: Glycemic Management (Diabetes, Type 2)   Priority: Medium    Long-Range Goal: Glycemic Management Optimized   Start Date: 05/12/2020  This Visit's Progress:  On track  Priority: Medium  Note:   Current Barriers:  . Chronic Disease Management support and education needs related to diabetes in a patient with hypertension and elevated kidney functions . Lacks caregiver support.  Karen Kays to independently drive  Nurse Case Manager Clinical Goal(s):  . patient will work with PCP to address needs related to medical management of diabetes . patient will meet with RN Care Manager to address self-management of diabetes  Interventions:  . 1:1 collaboration with Sharion Balloon, FNP regarding development and update of comprehensive plan of care as evidenced by provider attestation and co-signature . Inter-disciplinary care team collaboration (see longitudinal plan of care) . Evaluation of current treatment plan related to diabetes and patient's adherence to plan as established by provider. . Chart reviewed including relevant office notes and lab results o Discussed recent A1C and kidney functions . Reviewed and discussed medications o Should be avoiding NSAIDs o Compliant with Januvia . Discussed home blood sugar testing o Testing twice a day . Encouraged patient to continue checking blood sugar twice a day and PRN and to write the results down in a log . Advised to bring log to PCP appointment for review . Discussed diet o Eats mainly vegetables and some fruits o Tries to avoid overly ripe and high sugar fruits o Limits simple carbohydrates o Does not eat meat because it is expensive . Provided with RNCM contact number and encouraged to reach out as needed . Reviewed upcoming appointment with PCP and cancer center  Patient Goals/Self-Care Activities Over the next 60 days, patient will: . Keep appointment with PCP on 07/20/20 . Call PCP with any blood sugar readings outside of recommended range . Continue to avoid sugars and simple carbohydrates . Take Januvia as directed . Take blood sugar log and meter to PCP appointment . Call RNCM as  needed  Follow Up Plan:  . Telephone follow up appointment with care management team member scheduled for: 05/26/20 with RNCM . The patient has been provided with contact information for the care management team and has been advised to call with any health  related questions or concerns.  . Next PCP appointment scheduled for: 07/20/20 with Evelina Dun, FNP

## 2020-05-12 NOTE — Chronic Care Management (AMB) (Signed)
Chronic Care Management   CCM RN Visit Note  05/12/2020 Name: Denise Gardner MRN: [id number] DOB: 1946-01-15  Subjective: Denise Gardner is a 75 y.o. year old female who is a primary care patient of Sharion Balloon, FNP. The care management team was consulted for assistance with disease management and care coordination needs.    Engaged with patient by telephone for initial visit in response to provider referral for case management and/or care coordination services.   Consent to Services:  The patient was given the following information about Chronic Care Management services today, agreed to services, and gave verbal consent: 1. CCM service includes personalized support from designated clinical staff supervised by the primary care provider, including individualized plan of care and coordination with other care providers 2. 24/7 contact phone numbers for assistance for urgent and routine care needs. 3. Service will only be billed when office clinical staff spend 20 minutes or more in a month to coordinate care. 4. Only one practitioner may furnish and bill the service in a calendar month. 5.The patient may stop CCM services at any time (effective at the end of the month) by phone call to the office staff. 6. The patient will be responsible for cost sharing (co-pay) of up to 20% of the service fee (after annual deductible is met). Patient agreed to services and consent obtained.  Patient agreed to services and verbal consent obtained.   Assessment: Review of patient past medical history, allergies, medications, health status, including review of consultants reports, laboratory and other test data, was performed as part of comprehensive evaluation and provision of chronic care management services.   SDOH (Social Determinants of Health) assessments and interventions performed:    CCM Care Plan  Allergies  Allergen Reactions  . Milk-Related Compounds Itching and Nausea And Vomiting  . Eggs Or  Egg-Derived Products Itching, Nausea And Vomiting and Other (See Comments)    headaches  . Lac Bovis Other (See Comments)    Per PCP office  . Other Other (See Comments)    Other reaction(s): Other (See Comments) Per [facility] Other reaction(s): Other (See Comments) Per [facility] Per [facility]  . Statins   . Crestor [Rosuvastatin]     Myalgias   . Ezetimibe Other (See Comments)    mylagia Per PCP office    Outpatient Encounter Medications as of 05/12/2020  Medication Sig Note  . Accu-Chek FastClix Lancets MISC CHECK BLOOD SUGAR UP TO FOUR TIMES DAILY Dx E11.69   . acetaminophen (TYLENOL) 500 MG tablet Take 500 mg by mouth 2 (two) times daily. Pt takes 2 tablets at bedtime   . amLODipine (NORVASC) 10 MG tablet TAKE 1 TABLET BY MOUTH DAILY   . Ascorbic Acid (VITAMIN C) 100 MG tablet Take 100 mg by mouth daily.   Marland Kitchen aspirin 81 MG chewable tablet Chew 81 mg by mouth daily.   . Biotin 5000 MCG TABS Take by mouth.   . cholecalciferol (VITAMIN D) 400 UNITS TABS Take by mouth. VITAMIN D3 daily    . cyanocobalamin 100 MCG tablet Take 100 mcg by mouth daily.   Marland Kitchen dextromethorphan-guaiFENesin (MUCINEX DM) 30-600 MG 12hr tablet Take 1 tablet by mouth daily as needed for cough.   . diazepam (VALIUM) 5 MG tablet Take 1 tablet (5 mg total) by mouth 2 (two) times daily. Pt takes one tablet at daytime and one tablet at bedtime   . escitalopram (LEXAPRO) 10 MG tablet TAKE 1 TABLET BY MOUTH  DAILY   .  FLOVENT HFA 110 MCG/ACT inhaler INHALE 2 PUFFS INTO THE LUNGS TWICE DAILY   . fluticasone (FLONASE) 50 MCG/ACT nasal spray SHAKE LIQUID AND USE 2 SPRAYS IN EACH NOSTRIL DAILY   . gabapentin (NEURONTIN) 300 MG capsule Take 1 capsule (300 mg total) by mouth 3 (three) times daily. (Patient taking differently: Take 300 mg by mouth 3 (three) times daily. Taking 1 in the morning and 2 at bedtime)   . glucose blood (ONETOUCH ULTRA) test strip Test BS BID dx E11.9   . hydrOXYzine  (VISTARIL) 25 MG capsule TAKE 1 CAPSULE(25 MG) BY MOUTH THREE TIMES DAILY AS NEEDED   . ipratropium-albuterol (DUONEB) 0.5-2.5 (3) MG/3ML SOLN USE 1 VIAL VIA NEBULIZER EVERY 6 HOURS   . isosorbide mononitrate (IMDUR) 120 MG 24 hr tablet Take 2 tablets (240 mg total) by mouth daily. (Patient taking differently: No sig reported)   . levothyroxine (SYNTHROID) 100 MCG tablet Take 1 tablet (100 mcg total) by mouth daily. (Patient taking differently: Take 88 mcg by mouth daily. Take 30 min before breakfast and any other medications) 05/12/2020: Prescribed 155mg on 04/21/20 but is still taking 833m  . metFORMIN (GLUCOPHAGE-XR) 500 MG 24 hr tablet TAKE 2 TABLETS(1000 MG) BY MOUTH DAILY WITH BREAKFAST   . metoprolol tartrate (LOPRESSOR) 25 MG tablet Take 1 tablet (25 mg total) by mouth 2 (two) times daily.   . montelukast (SINGULAIR) 10 MG tablet TAKE 1 TABLET(10 MG) BY MOUTH AT BEDTIME   . naproxen (NAPROSYN) 500 MG tablet Take 500 mg by mouth 2 (two) times daily with a meal. 05/12/2020: 05/12/2020 Talk with provider about appropriateness due to DM and CKD  . nitroGLYCERIN (NITROSTAT) 0.4 MG SL tablet DISSOLVE 1 TABLET UNDER THE TONGUE EVERY 5 MINUTES AS  NEEDED FOR CHEST PAIN. MAX  OF 3 TABLETS IN 15 MINUTES. CALL 911 IF PAIN PERSISTS.   . Marland Kitchenmeprazole (PRILOSEC) 20 MG capsule TAKE 1 CAPSULE BY MOUTH  TWICE DAILY BEFORE A MEAL (Patient taking differently: TAKE 1 CAPSULE BY MOUTH 30 MIN BEFORE LUNCH AND 30 MIN BEFORE SUPPER (DO NOT TAKE WITH LEVOTHYROXINE))   . raloxifene (EVISTA) 60 MG tablet TAKE 1 TABLET BY MOUTH  DAILY   . sitaGLIPtin (JANUVIA) 100 MG tablet TAKE 1 TABLET BY MOUTH EVERY DAY   . solifenacin (VESICARE) 10 MG tablet Take 1 tablet (10 mg total) by mouth daily.   . Marland KitchenASCEPA 1 g capsule TAKE 4 CAPSULES BY MOUTH EVERY DAY   . zinc gluconate 50 MG tablet Take 50 mg by mouth daily.   . Garlic 10 MG CAPS Take by mouth. (Patient not taking: Reported on 05/12/2020)   . Multiple Vitamins-Minerals (HAIR  SKIN AND NAILS FORMULA PO) Take 1 tablet by mouth daily. (Patient not taking: Reported on 05/12/2020)    No facility-administered encounter medications on file as of 05/12/2020.    Patient Active Problem List   Diagnosis Date Noted  . Precordial chest pain 03/03/2020  . Controlled substance agreement signed 08/16/2018  . Myalgia 11/24/2017  . Iron deficiency anemia 05/26/2017  . Vitamin B 12 deficiency 05/26/2017  . Obesity (BMI 30-39.9) 05/25/2017  . Constipation 05/19/2016  . Hypokalemia 04/30/2015  . Depression 07/18/2014  . Diabetic neuropathy (HCMcIntire02/18/2016  . OAB (overactive bladder) 04/17/2014  . Peripheral neuropathy 04/17/2014  . Hypothyroidism 12/27/2012  . Osteopenia 05/30/2012  . Type 2 diabetes mellitus (HCCrab [place]03/24/2014  . Asthma 05/21/2012  . Chronic obstructive pulmonary disease (HCColdstream03/24/2014  . Gastroesophageal reflux disease 05/21/2012  . Hyperlipidemia  associated with type 2 diabetes mellitus ([place]) 01/26/2011  . Hypertension associated with diabetes ([place])   . Anxiety   . Chronic coronary artery disease     Conditions to be addressed/monitored:HTN, DMII and hypothyroidism  Care Plan : RNCM: Wellness (Adult)  Updates made by Ilean China, RN since 05/12/2020 12:00 AM    Problem: Medication Adherence (Wellness)   Priority: High    Long-Range Goal: Medication Adherence Maintained   Start Date: 05/12/2020  This Visit's Progress: Not on track  Priority: High  Note:   Current Barriers:  Marland Kitchen Knowledge Deficits related to medications and what they are used to treat . Lacks caregiver support.  . Film/video editor.  . Unable to independently drive . Has incorrect dose of levothyroxine . Taking medication that has been discontinued  Nurse Case Manager Clinical Goal(s):  . patient will verbalize understanding of plan for medication management . patient will meet with RN Care Manager to address questions and needs related to medication  management . Patient will take prescribed medication as directed and will stop taking discontinued medications  Interventions:  . 1:1 collaboration with Sharion Balloon, FNP regarding development and update of comprehensive plan of care as evidenced by provider attestation and co-signature . Inter-disciplinary care team collaboration (see longitudinal plan of care) . Chart reviewed including recent office notes and lab results . Reviewed and extensively discussed medications with patient o Compared medication list in [facility] with patient's prescription bottles at home o Updated medication list to reflect what patient is actually taking o Discussed change in levothyroxine from 27mg to 1023m at last office visit on 04/21/20 due to elevated TSH - Patient is still taking 88 mcg and did not pick up 100 mcg from [facility] with her other meds a few days ago . Discussed medication organization  o Patient uses a weekly pill box to organize meds . Outreached to [facility] at [phone] left a message o Advised that new script had been sent in but patient has not picked it up o Advised that patient will pick it up next week when she has transportation o Asked that they call me back if they do not have 100 mcg on file for patient . [facility] to order repeat TSH for 06/19/20 to rck level after 8 weeks of increased levothyroxine dose o Documented that patient has not started the increased dose and that she will repeat labs at next office visit on 07/20/20.  o Patient made aware . Discussed use of Naproxen 500 mg o Patient is taking this twice a day for joint pain o Informed that NSAIDs are not recommended for patients with diabetes and CKD - Patient was unaware o Reviewed lab notes and PCP documented to d/c naproxen on 01/20/21 o Patient advised to stop now and talk with PCP about other joint pain management options . Telephone message sent to PCP advising  of NSAID use and requesting other recommendations for pain control since she is also taking tylenol . Discussed use of omeprazole and levothyroxine o Advised to take levothyroxine 30 minutes before breakfast and any other medications, especially omeprazole o Often takes omeprazole once a day, in the morning - Recommended she take it 30 minutes before dinner - If she needs to take it twice a day, can take it 30 min before lunch and 30 minutes before supper . Provided with RNCM contact number and encouraged to reach out as needed . Reviewed upcoming appointment with  PCP on 07/20/20  Patient Goals/Self-Care Activities Over the next 30 days, patient will: . Start taking levothyroxine 100 mcg . Take levothyroxine on an empty stomach 30 minutes before breakfast and other medications . Do not take levothyroxine with omeprazole . Take omeprazole 30 minutes before dinner or 30 min before lunch and 30 min before dinner if needed . Stop taking naproxen . Talk with PCP about joint pain control since you're stopping naproxen . Call [place] with any questions about your medications    Care Plan : RNCM:Diabetes Type 2 (Adult)  Updates made by Ilean China, RN since 05/12/2020 12:00 AM    Problem: RNCM: Glycemic Management (Diabetes, Type 2)   Priority: Medium    Long-Range Goal: Glycemic Management Optimized   Start Date: 05/12/2020  This Visit's Progress: On track  Priority: Medium  Note:   Current Barriers:  . Chronic Disease Management support and education needs related to diabetes in a patient with hypertension and elevated kidney functions . Lacks caregiver support.  Karen Kays to independently drive  Nurse Case Manager Clinical Goal(s):  . patient will work with PCP to address needs related to medical management of diabetes . patient will meet with RN Care Manager to address self-management of diabetes  Interventions:  . 1:1 collaboration with Sharion Balloon, FNP regarding  development and update of comprehensive plan of care as evidenced by provider attestation and co-signature . Inter-disciplinary care team collaboration (see longitudinal plan of care) . Evaluation of current treatment plan related to diabetes and patient's adherence to plan as established by provider. . Chart reviewed including relevant office notes and lab results o Discussed recent A1C and kidney functions . Reviewed and discussed medications o Should be avoiding NSAIDs o Compliant with Januvia . Discussed home blood sugar testing o Testing twice a day . Encouraged patient to continue checking blood sugar twice a day and PRN and to write the results down in a log . Advised to bring log to PCP appointment for review . Discussed diet o Eats mainly vegetables and some fruits o Tries to avoid overly ripe and high sugar fruits o Limits simple carbohydrates o Does not eat meat because it is expensive . Provided with RNCM contact number and encouraged to reach out as needed . Reviewed upcoming appointment with PCP and cancer center  Patient Goals/Self-Care Activities Over the next 60 days, patient will: . Keep appointment with PCP on 07/20/20 . Call PCP with any blood sugar readings outside of recommended range . Continue to avoid sugars and simple carbohydrates . Take Januvia as directed . Take blood sugar log and meter to PCP appointment . Call RNCM as needed      Follow Up Plan:  . Telephone follow up appointment with care management team member scheduled for: 05/26/20 with RNCM . The patient has been provided with contact information for the care management team and has been advised to call with any health related questions or concerns.  . Next PCP appointment scheduled for: 07/20/20 with Evelina Dun, FNP  Chong Sicilian, BSN, RN-BC [place] [facility] [place] [facility] Direct Dial: [phone]

## 2020-05-12 NOTE — Telephone Encounter (Signed)
05/12/2020  Patient had abnormal TSH on 04/21/20 and levothyroxine was increased. PCP wanted to see patient in 2 months for repeat labs and further adjustment if needed. Patient's next appointment is scheduled with Evelina Dun, FNP on 07/20/20.   Would you like to put in an order for TSH to be drawn around 06/19/20? She would still keep the routine 3 month f/u for 07/20/20.  Forwarding to PCP, Evelina Dun, FNP, for review and order if appropriate. Patient will need to be contacted regarding lab order if placed.   Chong Sicilian, BSN, RN-BC Embedded Chronic Care Manager [facility] Direct Dial: [phone]

## 2020-05-12 NOTE — Telephone Encounter (Signed)
Please let patient know she needs her Thyroid recheck around 06/19/20. Labs ordered.

## 2020-05-12 NOTE — Telephone Encounter (Signed)
Busy x2 ac 05/12/20

## 2020-05-12 NOTE — Telephone Encounter (Signed)
05/12/2020  Spoke with patient and pharmacy did not fill the levothyroxine 137mcg. She is still taking 75mcg. She will start that ASAP and just wait and have labs drawn at appt with Christy on 07/20/20.  Chong Sicilian, BSN, RN-BC Embedded Chronic Care Manager [facility] Direct Dial: [phone]

## 2020-05-14 NOTE — Telephone Encounter (Signed)
Pt should stop taking naprosyn related to her kidney function. She can continue tylenol as needed.

## 2020-05-14 NOTE — Telephone Encounter (Signed)
**Note De-identified  Obfuscation** Patient aware and verbalizes understanding. 

## 2020-05-19 NOTE — Progress Notes (Signed)
I    Cardiology Office Note   Date:  05/20/2020   ID:  Hae, Ahlers [birth year], MRN [id number]  PCP:  Sharion Balloon, FNP  Cardiologist:   Minus Breeding, MD  Chief Complaint  Patient presents with  . Chest Pain      History of Present Illness: Denise Gardner is a 75 y.o. female who presents for evaluation of CAD.  I saw her last in 2014.  she had a stress perfusion study which demonstrated an EF of 71% and questionable inferior defect with questionable artifact vs infarct with mild periinfarct ischemia.    I last saw her in 2019. She had chest discomfort and on remarkable perfusion study with no evidence of ischemia. She had chest pain and had a negative perfusion study late last year.    Since her stress test late last year she has had decreased need for nitroglycerin.  She occasionally takes 1 but it is much improved compared to previous.  She is relatively sedentary.  She does walk a short distance from the church.  This is not bringing on any symptoms.  She is not having any new shortness of breath, PND or orthopnea.  She is not having any new palpitations, presyncope or syncope.  She has had no weight gain or edema.   Past Medical History:  Diagnosis Date  . Anxiety    depression  . Asthma   . Cataract   . Chronic back pain   . Coronary artery disease    Taxus stent to RCA 2008 2.5 x 16, non obstructive 15% proximal right coronary artery, patent curcumflex LAD, preserved LV  . Diabetes mellitus without complication ([place])   . GERD (gastroesophageal reflux disease)   . Hyperlipidemia   . Hypertension   . Insomnia   . Migraine   . Neuropathy   . Shingles   . Thyroid disease   . Ulcer, stomach peptic     Past Surgical History:  Procedure Laterality Date  . TOTAL ABDOMINAL HYSTERECTOMY       Current Outpatient Medications  Medication Sig Dispense Refill  . acetaminophen (TYLENOL) 500 MG tablet Take 500 mg by mouth 2 (two) times daily. Pt takes 2 tablets at  bedtime    . amLODipine (NORVASC) 10 MG tablet TAKE 1 TABLET BY MOUTH DAILY 90 tablet 1  . Ascorbic Acid (VITAMIN C) 100 MG tablet Take 100 mg by mouth daily.    Marland Kitchen aspirin 81 MG chewable tablet Chew 81 mg by mouth daily.    . Biotin 5000 MCG TABS Take by mouth.    . cholecalciferol (VITAMIN D) 400 UNITS TABS Take by mouth. VITAMIN D3 daily     . cyanocobalamin 100 MCG tablet Take 100 mcg by mouth daily.    Marland Kitchen dextromethorphan-guaiFENesin (MUCINEX DM) 30-600 MG 12hr tablet Take 1 tablet by mouth daily as needed for cough.    . diazepam (VALIUM) 5 MG tablet Take 1 tablet (5 mg total) by mouth 2 (two) times daily. Pt takes one tablet at daytime and one tablet at bedtime 45 tablet 2  . escitalopram (LEXAPRO) 10 MG tablet TAKE 1 TABLET BY MOUTH  DAILY 90 tablet 0  . FLOVENT HFA 110 MCG/ACT inhaler INHALE 2 PUFFS INTO THE LUNGS TWICE DAILY 12 g 0  . fluticasone (FLONASE) 50 MCG/ACT nasal spray SHAKE LIQUID AND USE 2 SPRAYS IN EACH NOSTRIL DAILY 48 g 0  . gabapentin (NEURONTIN) 300 MG capsule Take 1 capsule (300 mg total) by mouth  3 (three) times daily. (Patient taking differently: Take 300 mg by mouth 3 (three) times daily. Taking 1 in the morning and 2 at bedtime) 270 capsule 2  . Garlic 10 MG CAPS Take by mouth.    . hydrOXYzine (VISTARIL) 25 MG capsule TAKE 1 CAPSULE(25 MG) BY MOUTH THREE TIMES DAILY AS NEEDED 90 capsule 0  . ipratropium-albuterol (DUONEB) 0.5-2.5 (3) MG/3ML SOLN USE 1 VIAL VIA NEBULIZER EVERY 6 HOURS 1080 mL 0  . isosorbide mononitrate (IMDUR) 120 MG 24 hr tablet Take 2 tablets (240 mg total) by mouth daily. (Patient taking differently: Take 120 mg by mouth daily.) 60 tablet 11  . levothyroxine (SYNTHROID) 100 MCG tablet Take 1 tablet (100 mcg total) by mouth daily. (Patient taking differently: Take 88 mcg by mouth daily. Take 30 min before breakfast and any other medications) 90 tablet 1  . metFORMIN (GLUCOPHAGE-XR) 500 MG 24 hr tablet TAKE 2 TABLETS(1000 MG) BY MOUTH DAILY WITH  BREAKFAST 180 tablet 0  . metoprolol tartrate (LOPRESSOR) 25 MG tablet Take 1 tablet (25 mg total) by mouth 2 (two) times daily. 180 tablet 2  . montelukast (SINGULAIR) 10 MG tablet TAKE 1 TABLET(10 MG) BY MOUTH AT BEDTIME 90 tablet 3  . Multiple Vitamins-Minerals (HAIR SKIN AND NAILS FORMULA PO) Take 1 tablet by mouth daily.    . naproxen (NAPROSYN) 500 MG tablet Take 500 mg by mouth 2 (two) times daily with a meal.    . nitroGLYCERIN (NITROSTAT) 0.4 MG SL tablet DISSOLVE 1 TABLET UNDER THE TONGUE EVERY 5 MINUTES AS  NEEDED FOR CHEST PAIN. MAX  OF 3 TABLETS IN 15 MINUTES. CALL 911 IF PAIN PERSISTS. 100 tablet 3  . omeprazole (PRILOSEC) 20 MG capsule TAKE 1 CAPSULE BY MOUTH  TWICE DAILY BEFORE A MEAL (Patient taking differently: TAKE 1 CAPSULE BY MOUTH 30 MIN BEFORE LUNCH AND 30 MIN BEFORE SUPPER (DO NOT TAKE WITH LEVOTHYROXINE)) 180 capsule 0  . raloxifene (EVISTA) 60 MG tablet TAKE 1 TABLET BY MOUTH  DAILY 90 tablet 0  . sitaGLIPtin (JANUVIA) 100 MG tablet TAKE 1 TABLET BY MOUTH EVERY DAY 90 tablet 0  . solifenacin (VESICARE) 10 MG tablet Take 1 tablet (10 mg total) by mouth daily. 90 tablet 1  . VASCEPA 1 g capsule TAKE 4 CAPSULES BY MOUTH EVERY DAY 360 capsule 0  . zinc gluconate 50 MG tablet Take 50 mg by mouth daily.    . Accu-Chek FastClix Lancets MISC CHECK BLOOD SUGAR UP TO FOUR TIMES DAILY Dx E11.69 408 each 3  . glucose blood (ONETOUCH ULTRA) test strip Test BS BID dx E11.9 200 strip 3   No current facility-administered medications for this visit.    Allergies:   Milk-related compounds, Eggs or egg-derived products, Lac bovis, Other, Statins, Crestor [rosuvastatin], and Ezetimibe   ROS:  Please see the history of present illness.   Otherwise, review of systems are positive decreased memory.   All other systems are reviewed and negative.    PHYSICAL EXAM: VS:  BP 134/84   Pulse 76   Ht 5\' 5"  (1.651 m)   Wt 196 lb (88.9 kg)   BMI 32.62 kg/m  , BMI Body mass index is 32.62  kg/m. GENERAL:  Well appearing NECK:  No jugular venous distention, waveform within normal limits, carotid upstroke brisk and symmetric, no bruits, no thyromegaly LUNGS:  Clear to auscultation bilaterally CHEST:  Unremarkable HEART:  PMI not displaced or sustained,S1 and S2 within normal limits, no S3, no S4, no  clicks, no rubs, no murmurs ABD:  Flat, positive bowel sounds normal in frequency in pitch, no bruits, no rebound, no guarding, no midline pulsatile mass, no hepatomegaly, no splenomegaly EXT:  2 plus pulses throughout, no edema, no cyanosis no clubbing   EKG:  EKG is not ordered today. NA  Recent Labs: 04/21/2020: ALT 12; BUN 23; Creatinine, Ser 1.37; Hemoglobin 14.9; Platelets 222; Potassium 4.8; Sodium 142; TSH 7.800    Lipid Panel    Component Value Date/Time   CHOL 267 (H) 03/19/2019 1210   CHOL 136 09/05/2012 1256   TRIG 112 03/19/2019 1210   TRIG 43 12/28/2012 0938   TRIG 38 09/05/2012 1256   HDL 58 03/19/2019 1210   HDL 66 12/28/2012 0938   HDL 59 09/05/2012 1256   CHOLHDL 4.6 (H) 03/19/2019 1210   LDLCALC 189 (H) 03/19/2019 1210   LDLCALC 80 12/28/2012 0938   LDLCALC 69 09/05/2012 1256      Wt Readings from Last 3 Encounters:  05/20/20 196 lb (88.9 kg)  04/21/20 200 lb 12.8 oz (91.1 kg)  03/04/20 203 lb (92.1 kg)      Other studies Reviewed: Additional studies/ records that were reviewed today include: Labs Review of the above records demonstrates:  Please see elsewhere in the note.     ASSESSMENT AND PLAN:  CHEST PAIN:     She has a stable chest pain pattern on the increased nitroglycerin that I prescribed previously.  She has no high risk findings on the stress test.  No change in therapy.  She needs continued risk reduction.   HTN:  The blood pressure is at target.  No change in therapy.   DYSLIPIDEMIA:  Her LDL is 189 with a total cholesterol of 267.  She has been intolerant of statins.  She cannot get down to [facility] for a lipid clinic  appointment but we are going to try to arrange for her to get education at her primary provider office if we can get approval for PCSK9 inhibitor and start this therapy.  She is willing.  We talked about diet  Current medicines are reviewed at length with the patient today.  The patient does not have concerns regarding medicines.  The following changes have been made:   As above  Labs/ tests ordered today include:     No orders of the defined types were placed in this encounter.    Disposition:   FU with me in 12 months   Signed, Minus Breeding, MD  05/20/2020 2:55 PM    [place]

## 2020-05-20 ENCOUNTER — Other Ambulatory Visit: Payer: Self-pay | Admitting: Family

## 2020-05-20 ENCOUNTER — Ambulatory Visit: Payer: Medicare Other | Admitting: Cardiology

## 2020-05-20 ENCOUNTER — Other Ambulatory Visit: Payer: Self-pay

## 2020-05-20 ENCOUNTER — Encounter: Payer: Self-pay | Admitting: Cardiology

## 2020-05-20 VITALS — BP 134/84 | HR 76 | Ht 65.0 in | Wt 196.0 lb

## 2020-05-20 DIAGNOSIS — R072 Precordial pain: Secondary | ICD-10-CM

## 2020-05-20 DIAGNOSIS — I10 Essential (primary) hypertension: Secondary | ICD-10-CM

## 2020-05-20 DIAGNOSIS — F419 Anxiety disorder, unspecified: Secondary | ICD-10-CM

## 2020-05-20 DIAGNOSIS — E785 Hyperlipidemia, unspecified: Secondary | ICD-10-CM | POA: Diagnosis not present

## 2020-05-20 NOTE — Patient Instructions (Signed)
Medication Instructions:  The current medical regimen is effective;  continue present plan and medications.  *If you need a refill on your cardiac medications before your next appointment, please call your pharmacy*  You will be contacted about further treatment for your Hyperlipidemia.  Follow-Up: At [facility], you and your health needs are our priority.  As part of our continuing mission to provide you with exceptional heart care, we have created designated Provider Care Teams.  These Care Teams include your primary Cardiologist (physician) and Advanced Practice Providers (APPs -  Physician Assistants and Nurse Practitioners) who all work together to provide you with the care you need, when you need it.  We recommend signing up for the patient portal called "MyChart".  Sign up information is provided on this After Visit Summary.  MyChart is used to connect with patients for Virtual Visits (Telemedicine).  Patients are able to view lab/test results, encounter notes, upcoming appointments, etc.  Non-urgent messages can be sent to your provider as well.   To learn more about what you can do with MyChart, go to [email or web address].    Your next appointment:   12 month(s)  The format for your next appointment:   In Person  Provider:   Minus Breeding, MD   Thank you for choosing [facility]!!

## 2020-05-22 ENCOUNTER — Telehealth: Payer: Medicare Other

## 2020-05-25 ENCOUNTER — Other Ambulatory Visit: Payer: Self-pay | Admitting: Family

## 2020-05-25 DIAGNOSIS — M8580 Other specified disorders of bone density and structure, unspecified site: Secondary | ICD-10-CM

## 2020-05-26 ENCOUNTER — Ambulatory Visit: Payer: Medicare Other | Admitting: *Deleted

## 2020-05-26 DIAGNOSIS — E039 Hypothyroidism, unspecified: Secondary | ICD-10-CM

## 2020-05-26 DIAGNOSIS — E1142 Type 2 diabetes mellitus with diabetic polyneuropathy: Secondary | ICD-10-CM

## 2020-05-26 DIAGNOSIS — I152 Hypertension secondary to endocrine disorders: Secondary | ICD-10-CM | POA: Diagnosis not present

## 2020-05-26 DIAGNOSIS — E1159 Type 2 diabetes mellitus with other circulatory complications: Secondary | ICD-10-CM | POA: Diagnosis not present

## 2020-05-26 NOTE — Chronic Care Management (AMB) (Signed)
Chronic Care Management   CCM RN Visit Note  05/26/2020 Name: Denise Gardner MRN: [id number] DOB: [birth year]  Subjective: Denise Gardner is a 75 y.o. year old female who is a primary care patient of Sharion Balloon, FNP. The care management team was consulted for assistance with disease management and care coordination needs.    Engaged with patient by telephone for follow up visit in response to provider referral for case management and/or care coordination services.   Consent to Services:  The patient was given information about Chronic Care Management services, agreed to services, and gave verbal consent prior to initiation of services.  Please see initial visit note for detailed documentation.   Patient agreed to services and verbal consent obtained.   Assessment: Review of patient past medical history, allergies, medications, health status, including review of consultants reports, laboratory and other test data, was performed as part of comprehensive evaluation and provision of chronic care management services.   SDOH (Social Determinants of Health) assessments and interventions performed:    CCM Care Plan  Allergies  Allergen Reactions  . Milk-Related Compounds Itching and Nausea And Vomiting  . Eggs Or Egg-Derived Products Itching, Nausea And Vomiting and Other (See Comments)    headaches  . Lac Bovis Other (See Comments)    Per PCP office  . Other Other (See Comments)    Other reaction(s): Other (See Comments) Per [facility] Other reaction(s): Other (See Comments) Per [facility] Per [facility]  . Statins   . Crestor [Rosuvastatin]     Myalgias   . Ezetimibe Other (See Comments)    mylagia Per PCP office    Outpatient Encounter Medications as of 05/26/2020  Medication Sig Note  . Accu-Chek FastClix Lancets MISC CHECK BLOOD SUGAR UP TO FOUR TIMES DAILY Dx E11.69   . acetaminophen (TYLENOL) 500 MG tablet Take 500 mg by mouth 2 (two) times daily. Pt  takes 2 tablets at bedtime   . amLODipine (NORVASC) 10 MG tablet TAKE 1 TABLET BY MOUTH DAILY   . Ascorbic Acid (VITAMIN C) 100 MG tablet Take 100 mg by mouth daily.   Marland Kitchen aspirin 81 MG chewable tablet Chew 81 mg by mouth daily.   . Biotin 5000 MCG TABS Take by mouth.   . cholecalciferol (VITAMIN D) 400 UNITS TABS Take by mouth. VITAMIN D3 daily    . cyanocobalamin 100 MCG tablet Take 100 mcg by mouth daily.   Marland Kitchen dextromethorphan-guaiFENesin (MUCINEX DM) 30-600 MG 12hr tablet Take 1 tablet by mouth daily as needed for cough.   . diazepam (VALIUM) 5 MG tablet Take 1 tablet (5 mg total) by mouth 2 (two) times daily. Pt takes one tablet at daytime and one tablet at bedtime   . escitalopram (LEXAPRO) 10 MG tablet TAKE 1 TABLET BY MOUTH  DAILY   . FLOVENT HFA 110 MCG/ACT inhaler INHALE 2 PUFFS INTO THE LUNGS TWICE DAILY   . fluticasone (FLONASE) 50 MCG/ACT nasal spray SHAKE LIQUID AND USE 2 SPRAYS IN EACH NOSTRIL DAILY   . gabapentin (NEURONTIN) 300 MG capsule Take 1 capsule (300 mg total) by mouth 3 (three) times daily. (Patient taking differently: Take 300 mg by mouth 3 (three) times daily. Taking 1 in the morning and 2 at bedtime)   . Garlic 10 MG CAPS Take by mouth.   Marland Kitchen glucose blood (ONETOUCH ULTRA) test strip Test BS BID dx E11.9   . hydrOXYzine (VISTARIL) 25 MG capsule TAKE 1 CAPSULE(25 MG) BY MOUTH THREE  TIMES DAILY AS NEEDED   . ipratropium-albuterol (DUONEB) 0.5-2.5 (3) MG/3ML SOLN USE 1 VIAL VIA NEBULIZER EVERY 6 HOURS   . isosorbide mononitrate (IMDUR) 120 MG 24 hr tablet Take 2 tablets (240 mg total) by mouth daily. (Patient taking differently: Take 120 mg by mouth daily.)   . levothyroxine (SYNTHROID) 100 MCG tablet Take 1 tablet (100 mcg total) by mouth daily. (Patient taking differently: Take 100 mcg by mouth daily before breakfast.)   . metFORMIN (GLUCOPHAGE-XR) 500 MG 24 hr tablet TAKE 2 TABLETS(1000 MG) BY MOUTH DAILY WITH BREAKFAST   . metoprolol tartrate (LOPRESSOR) 25 MG tablet Take  1 tablet (25 mg total) by mouth 2 (two) times daily.   . montelukast (SINGULAIR) 10 MG tablet TAKE 1 TABLET(10 MG) BY MOUTH AT BEDTIME   . Multiple Vitamins-Minerals (HAIR SKIN AND NAILS FORMULA PO) Take 1 tablet by mouth daily.   . nitroGLYCERIN (NITROSTAT) 0.4 MG SL tablet DISSOLVE 1 TABLET UNDER THE TONGUE EVERY 5 MINUTES AS  NEEDED FOR CHEST PAIN. MAX  OF 3 TABLETS IN 15 MINUTES. CALL 911 IF PAIN PERSISTS.   Marland Kitchen omeprazole (PRILOSEC) 20 MG capsule TAKE 1 CAPSULE BY MOUTH  TWICE DAILY BEFORE A MEAL (Patient taking differently: TAKE 1 CAPSULE BY MOUTH 30 MIN BEFORE LUNCH AND 30 MIN BEFORE SUPPER (DO NOT TAKE WITH LEVOTHYROXINE))   . raloxifene (EVISTA) 60 MG tablet TAKE 1 TABLET(60 MG) BY MOUTH DAILY   . sitaGLIPtin (JANUVIA) 100 MG tablet TAKE 1 TABLET BY MOUTH EVERY DAY   . solifenacin (VESICARE) 10 MG tablet Take 1 tablet (10 mg total) by mouth daily.   Marland Kitchen VASCEPA 1 g capsule TAKE 4 CAPSULES BY MOUTH EVERY DAY   . zinc gluconate 50 MG tablet Take 50 mg by mouth daily.   . [DISCONTINUED] naproxen (NAPROSYN) 500 MG tablet Take 500 mg by mouth 2 (two) times daily with a meal. 05/12/2020: 05/12/2020 Talk with provider about appropriateness due to DM and CKD   No facility-administered encounter medications on file as of 05/26/2020.    Patient Active Problem List   Diagnosis Date Noted  . Precordial chest pain 03/03/2020  . Controlled substance agreement signed 08/16/2018  . Myalgia 11/24/2017  . Iron deficiency anemia 05/26/2017  . Vitamin B 12 deficiency 05/26/2017  . Obesity (BMI 30-39.9) 05/25/2017  . Constipation 05/19/2016  . Hypokalemia 04/30/2015  . Depression 07/18/2014  . Diabetic neuropathy ([place]) 04/17/2014  . OAB (overactive bladder) 04/17/2014  . Peripheral neuropathy 04/17/2014  . Hypothyroidism 12/27/2012  . Osteopenia 05/30/2012  . Type 2 diabetes mellitus ([place]) 05/21/2012  . Asthma 05/21/2012  . Chronic obstructive pulmonary disease ([place]) 05/21/2012  . Gastroesophageal  reflux disease 05/21/2012  . Hyperlipidemia associated with type 2 diabetes mellitus ([place]) 01/26/2011  . Hypertension associated with diabetes ([place])   . Anxiety   . Chronic coronary artery disease     Conditions to be addressed/monitored:HTN, HLD, DMII and hypothyroidism  Care Plan : RNCM: Wellness (Adult)  Updates made by Ilean China, RN since 05/26/2020 12:00 AM    Problem: Medication Adherence (Wellness)   Priority: High    Long-Range Goal: Medication Adherence Maintained   Start Date: 05/12/2020  This Visit's Progress: On track  Recent Progress: Not on track  Priority: High  Note:   Current Barriers:  Marland Kitchen Knowledge Deficits related to medications and what they are used to treat . Lacks caregiver support.  . 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  patient will verbalize understanding of plan for medication management . patient will meet with RN Care Manager to address questions and needs related to medication management . Patient will take prescribed medication as directed   Interventions:  . 1:1 collaboration with Sharion Balloon, FNP regarding development and update of comprehensive plan of care as evidenced by provider attestation and co-signature . Inter-disciplinary care team collaboration (see longitudinal plan of care) . Chart reviewed including recent office notes and lab results . Reviewed and extensively discussed medications with patient o Compared medication list in [facility] with patient's prescription bottles at home o Updated medication list to reflect what patient is actually taking o Discussed change in levothyroxine from 59mcg to 169mcg at last office visit on 04/21/20 due to elevated TSH - Until today, patient has been taking 88 mcg but does have the 100 mcg - She did remove the 88 mcg from her pill box and replaced with 100 mcg while on the phone with me . She's going to put the bottle of 88 mcg  away . Discussed medication organization  o Patient uses a weekly pill box to organize meds . Discussed need to repeat TSH at next visit with PCP on 07/20/20 . Discussed use of medication for joint pain o Previously advised to d/c naproxen due to elevated kidney functions o Collaborated with PCP about this and [facility] clinical staff also talked with patient and advised to d/c o She is taking 2 tylenol at bedtime for pain . Discussed use of omeprazole and levothyroxine o Reinforced need to take levothyroxine 30 minutes before breakfast and any other medications o Reinforced to avoid taking omeprazole with levothyroxine - Reminded that she can take it 30 minutes before lunch and 30 minute before supper . Discussed use of Vesicare for incontinence and overactive bladder o Patient isn't seeing a benefit and it is drying her mouth out. Considering discontinuing it.  . Provided with RNCM contact number and encouraged to reach out as needed . Reviewed upcoming appointment with PCP on 07/20/20  Patient Goals/Self-Care Activities Over the next 30 days, patient will: . Start taking levothyroxine 100 mcg . Take levothyroxine on an empty stomach 30 minutes before breakfast and other medications . Do not take levothyroxine with omeprazole . Take omeprazole 30 minutes before lunch and 30 min before supper. If only taking once a day, take 30 minute before supper.  . Talk with PCP about joint pain control since you're stopping naproxen . Talk with PCP about dry mouth since starting vesicare . Call [place] with any questions about your medications    Care Plan : RNCM:Diabetes Type 2 (Adult)  Updates made by Ilean China, RN since 05/26/2020 12:00 AM    Problem: RNCM: Glycemic Management (Diabetes, Type 2)   Priority: Medium    Long-Range Goal: Glycemic Management Optimized   Start Date: 05/12/2020  This Visit's Progress: On track  Recent Progress: On track  Priority: Medium  Note:    Objective: Lab Results  Component Value Date   HGBA1C 6.7 04/21/2020   HGBA1C 6.4 01/20/2020   HGBA1C 6.4 10/17/2019   Lab Results  Component Value Date   MICROALBUR 20 04/17/2014   LDLCALC 189 (H) 03/19/2019   CREATININE 1.37 (H) 04/21/2020   Current Barriers:  . Chronic Disease Management support and education needs related to diabetes in a patient with hypertension and elevated kidney functions . Lacks caregiver support.  Karen Kays to independently drive  Nurse Case Manager Clinical Goal(s):  .  patient will work with PCP to address needs related to medical management of diabetes . patient will meet with RN Care Manager to address self-management of diabetes  Interventions:  . 1:1 collaboration with Sharion Balloon, FNP regarding development and update of comprehensive plan of care as evidenced by provider attestation and co-signature . Inter-disciplinary care team collaboration (see longitudinal plan of care) . Evaluation of current treatment plan related to diabetes and patient's adherence to plan as established by provider. . Chart reviewed including relevant office notes and lab results o Discussed recent A1C and kidney functions . Reviewed and discussed medications o Should be avoiding NSAIDs o Compliant with medications and using a weekly pill box to organize them . Discussed home blood sugar testing o Testing twice a day o Blood sugar ranges from 115 to 200 o No readings below 70 o 157 fasting this morning . Advised to bring meter to next PCP appointment because she is concerned that it's innacurate o Can check her meter against office meter for accuracy  o Meter is a couple of years old and may need a script for a new one . Encouraged patient to continue checking blood sugar twice a day and PRN and to write the results down in a log . Advised to bring log to PCP appointment for review . Discussed diet o Verbal education provided o Will prepare and mail written  education material o Eats mainly vegetables and some fruits o Tries to avoid overly ripe and high sugar fruits o Limits simple carbohydrates o Does not eat meat because it is expensive . Collaborated with [facility] front office staff to mail diabetic meal planning EMMI educational material to patient . Discussed physical activity level o Patient walks outside around her home daily for exercise . Provided with RNCM contact number and encouraged to reach out as needed . Reviewed upcoming appointment with PCP and cancer center  Patient Goals/Self-Care Activities Over the next 60 days, patient will: . Keep appointment with PCP on 07/20/20 . Call PCP with any blood sugar readings outside of recommended range . Continue to avoid sugars and simple carbohydrates . Take Januvia and metformin as directed . Take blood sugar log and meter to PCP appointment . Have clinical staff compare blood sugar reading with your meter to the office meter . Call [facility] as needed [phone] . Review mailed educational material on diet .     Follow Up Plan:  . Telephone follow up appointment with care management team member scheduled for: 06/29/20 with RNCM, LCSW 4/25 . The patient has been provided with contact information for the care management team and has been advised to call with any health related questions or concerns.  . Next PCP appointment scheduled for: 07/20/20 with Evelina Dun, FNP . Next AWV 06/18/20 . Next appointment with oncology department is 4/6 for labs and 4/13 for virtual visit  Chong Sicilian, BSN, RN-BC [place] / [place] Management Direct Dial: [phone]

## 2020-05-26 NOTE — Patient Instructions (Signed)
Visit Information  PATIENT GOALS: Goals Addressed            This Visit's Progress   . Manage My Medicine   On track    Timeframe:  Long-Range Goal Priority:  High Start Date:   05/12/20                          Expected End Date: 02/27/21                      Follow Up Date 06/29/20   . Start taking levothyroxine 100 mcg . Take levothyroxine on an empty stomach 30 minutes before breakfast and other medications . Do not take levothyroxine with omeprazole . Take omeprazole 30 minutes before lunch and 30 min before supper. If only taking once a day, take 30 minute before supper.  . Talk with PCP about joint pain control since you're stopping naproxen . Talk with PCP about dry mouth since starting vesicare . Call RN Care Manager with any questions about your medications   Why is this important?   . These steps will help you keep on track with your medicines.   Notes:     Marland Kitchen Monitor and Manage My Blood Sugar-Diabetes Type 2   On track    Timeframe:  Long-Range Goal Priority:  Medium Start Date:  05/12/20                           Expected End Date:  02/27/21                      Follow Up Date 06/29/20   . Keep appointment with PCP on 07/20/20 . Call PCP with any blood sugar readings outside of recommended range . Continue to avoid sugars and simple carbohydrates . Take Januvia and metformin as directed . Take blood sugar log and meter to PCP appointment . Have clinical staff compare blood sugar reading with your meter to the office meter . Call [facility] as needed [phone] . Review mailed educational material on diet    Why is this important?    Checking your blood sugar at home helps to keep it from getting very high or very low.   Writing the results in a diary or log helps the doctor know how to care for you.   Your blood sugar log should have the time, date and the results.   Also, write down the amount of insulin or other medicine that you take.   Other information,  like what you ate, exercise done and how you were feeling, will also be helpful.     Notes:        Patient verbalizes understanding of instructions provided today and agrees to view in [place].   Follow Up Plan:  . Telephone follow up appointment with care management team member scheduled for: 06/29/20 with RNCM, LCSW 4/25 . The patient has been provided with contact information for the care management team and has been advised to call with any health related questions or concerns.  . Next PCP appointment scheduled for: 07/20/20 with Evelina Dun, FNP . Next AWV 06/18/20 . Next appointment with oncology department is 4/6 for labs and 4/13 for virtual visit  Chong Sicilian, BSN, RN-BC [place] / [place] Management Direct Dial: [phone]

## 2020-06-01 ENCOUNTER — Telehealth: Payer: Self-pay | Admitting: Family Medicine

## 2020-06-02 ENCOUNTER — Other Ambulatory Visit (HOSPITAL_COMMUNITY): Payer: Self-pay

## 2020-06-02 DIAGNOSIS — D509 Iron deficiency anemia, unspecified: Secondary | ICD-10-CM

## 2020-06-03 ENCOUNTER — Other Ambulatory Visit: Payer: Self-pay

## 2020-06-03 ENCOUNTER — Inpatient Hospital Stay (HOSPITAL_COMMUNITY): Payer: Medicare Other | Attending: Hematology

## 2020-06-03 ENCOUNTER — Other Ambulatory Visit: Payer: Self-pay | Admitting: Family

## 2020-06-03 DIAGNOSIS — F419 Anxiety disorder, unspecified: Secondary | ICD-10-CM

## 2020-06-03 DIAGNOSIS — K2100 Gastro-esophageal reflux disease with esophagitis, without bleeding: Secondary | ICD-10-CM

## 2020-06-03 DIAGNOSIS — D631 Anemia in chronic kidney disease: Secondary | ICD-10-CM | POA: Diagnosis not present

## 2020-06-03 DIAGNOSIS — E538 Deficiency of other specified B group vitamins: Secondary | ICD-10-CM | POA: Diagnosis not present

## 2020-06-03 DIAGNOSIS — N183 Chronic kidney disease, stage 3 unspecified: Secondary | ICD-10-CM | POA: Insufficient documentation

## 2020-06-03 DIAGNOSIS — J309 Allergic rhinitis, unspecified: Secondary | ICD-10-CM

## 2020-06-03 DIAGNOSIS — D509 Iron deficiency anemia, unspecified: Secondary | ICD-10-CM

## 2020-06-03 DIAGNOSIS — F32A Depression, unspecified: Secondary | ICD-10-CM

## 2020-06-03 LAB — CBC WITH DIFFERENTIAL/PLATELET
Abs Immature Granulocytes: 0.03 10*3/uL (ref 0.00–0.07)
Basophils Absolute: 0.1 10*3/uL (ref 0.0–0.1)
Basophils Relative: 1 %
Eosinophils Absolute: 0.3 10*3/uL (ref 0.0–0.5)
Eosinophils Relative: 4 %
HCT: 40.4 % (ref 36.0–46.0)
Hemoglobin: 13.4 g/dL (ref 12.0–15.0)
Immature Granulocytes: 0 %
Lymphocytes Relative: 32 %
Lymphs Abs: 2.9 10*3/uL (ref 0.7–4.0)
MCH: 30.7 pg (ref 26.0–34.0)
MCHC: 33.2 g/dL (ref 30.0–36.0)
MCV: 92.7 fL (ref 80.0–100.0)
Monocytes Absolute: 0.7 10*3/uL (ref 0.1–1.0)
Monocytes Relative: 8 %
Neutro Abs: 5 10*3/uL (ref 1.7–7.7)
Neutrophils Relative %: 55 %
Platelets: 198 10*3/uL (ref 150–400)
RBC: 4.36 MIL/uL (ref 3.87–5.11)
RDW: 13.3 % (ref 11.5–15.5)
WBC: 9 10*3/uL (ref 4.0–10.5)
nRBC: 0 % (ref 0.0–0.2)

## 2020-06-03 LAB — COMPREHENSIVE METABOLIC PANEL
ALT: 13 U/L (ref 0–44)
AST: 16 U/L (ref 15–41)
Albumin: 3.7 g/dL (ref 3.5–5.0)
Alkaline Phosphatase: 29 U/L — ABNORMAL LOW (ref 38–126)
Anion gap: 10 (ref 5–15)
BUN: 16 mg/dL (ref 8–23)
CO2: 28 mmol/L (ref 22–32)
Calcium: 9.2 mg/dL (ref 8.9–10.3)
Chloride: 99 mmol/L (ref 98–111)
Creatinine, Ser: 1.25 mg/dL — ABNORMAL HIGH (ref 0.44–1.00)
GFR, Estimated: 45 mL/min — ABNORMAL LOW (ref >60)
Glucose, Bld: 139 mg/dL — ABNORMAL HIGH (ref 70–99)
Potassium: 3.9 mmol/L (ref 3.5–5.1)
Sodium: 137 mmol/L (ref 135–145)
Total Bilirubin: 0.9 mg/dL (ref 0.3–1.2)
Total Protein: 6.9 g/dL (ref 6.5–8.1)

## 2020-06-03 LAB — IRON AND TIBC
Iron: 81 ug/dL (ref 28–170)
Saturation Ratios: 25 % (ref 10.4–31.8)
TIBC: 320 ug/dL (ref 250–450)
UIBC: 239 ug/dL

## 2020-06-03 LAB — VITAMIN B12: Vitamin B-12: 1320 pg/mL — ABNORMAL HIGH (ref 180–914)

## 2020-06-03 LAB — LACTATE DEHYDROGENASE: LDH: 144 U/L (ref 98–192)

## 2020-06-03 LAB — FOLATE: Folate: 30 ng/mL (ref >5.9)

## 2020-06-03 LAB — VITAMIN D 25 HYDROXY (VIT D DEFICIENCY, FRACTURES): Vit D, 25-Hydroxy: 41.83 ng/mL (ref 30–100)

## 2020-06-03 LAB — FERRITIN: Ferritin: 118 ng/mL (ref 11–307)

## 2020-06-10 ENCOUNTER — Inpatient Hospital Stay (HOSPITAL_BASED_OUTPATIENT_CLINIC_OR_DEPARTMENT_OTHER): Payer: Medicare Other | Admitting: Physician Assistant

## 2020-06-10 DIAGNOSIS — D509 Iron deficiency anemia, unspecified: Secondary | ICD-10-CM | POA: Diagnosis not present

## 2020-06-10 NOTE — Progress Notes (Signed)
Virtual Visit via Telephone Note [facility]  I connected with Denise Gardner on 06/10/20  at  11:19 A.M. by telephone and verified that I am speaking with the correct person using two identifiers.  Location: Patient: Home Provider: [facility]   I discussed the limitations, risks, security and privacy concerns of performing an evaluation and management service by telephone and the availability of in person appointments. I also discussed with the patient that there may be a patient responsible charge related to this service. The patient expressed understanding and agreed to proceed.   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  She was last seen on 11/27/2019.  Her last Feraheme infusion was 07/05/2017 and 07/12/2017.  She was previously anemic (hemoglobin as low as 10.4 on 05/25/2017), secondary to iron deficiency and B12 deficiency.  However, after repleting her iron and nutritional deficiencies, her blood count has been in the normal range over the past 2 years.  Most recent labs show hemoglobin 13.4, ferritin 118, vitamin B12 1320, normal folate, normal vitamin D.  CMP shows that her kidney function is at baseline, with creatinine 1.25 and GFR 45, indicating CKD stage III.   Her last Feraheme infusion was 07/05/2017 and 07/12/2017.  She stopped taking her oral iron - states that some doctor called her and told her to stop taking it about 1 month ago due to constipation.  She continues to take vitamin B12 every day.  She has persistent fatigue, reports that she does not sleep well due to chronic pain and neuropathy.  She states that she has no energy, she attributes this to lack of sleep.  She reports normal appetite, denies unintentional weight loss.  She denies recent chest pain on exertion, shortness of breath on minimal exertion,  pre-syncopal episodes, or palpitations. She had not noticed any recent bleeding such as epistaxis, hematuria, hematochezia, or melena. The patient denies over the counter NSAID ingestion. She is on 81 mg Aspirin at night, but is not on any other antiplatelets agents. She denies any pica and eats a variety of diet. She never donated blood or received blood transfusion.    Observations/Objective: Review of Systems  Constitutional: Negative for chills, diaphoresis, fever, malaise/fatigue and weight loss.  HENT: Positive for congestion. Negative for nosebleeds.   Respiratory: Negative for cough, hemoptysis and shortness of breath.   Cardiovascular: Negative for chest pain, palpitations and leg swelling.  Gastrointestinal: Positive for constipation. Negative for abdominal pain, blood in stool, diarrhea, nausea and vomiting.  Musculoskeletal: Positive for back pain and joint pain.  Endo/Heme/Allergies: Does not bruise/bleed easily.     PHYSICAL EXAM (per limitations of virtual telephone visit): The patient is alert and oriented x 3, exhibiting adequate mentation, good mood, and ability to speak in full sentences and execute sound judgement.    Assessment: 1.  Normocytic anemia with iron deficiency, B12 deficiency, and CKD -Patient was previously anemic (hemoglobin as low as 10.4 on 05/25/2017), secondary to iron deficiency, CKD 3, and B12 deficiency.   -After repleting her iron and nutritional deficiencies, her blood count has been in the normal range over the past 2 years.   -Most recent labs (06/03/2020) show hemoglobin 13.4, ferritin 118, vitamin B12 1320, normal folate, normal vitamin D.  CMP shows that her kidney function is at baseline, with creatinine 1.25 and GFR 45, indicating CKD stage  III. - She last had Feraheme on 07/05/2017 and 07/12/2017. -She was previously on daily oral iron, but states that her primary care provider told her to stop taking this about 1 month ago - She denies any  bleeding per rectum or melena.  Plan: 1.  Normocytic anemia with iron deficiency, B12 deficiency, and CKD -Hemoglobin currently within normal range, no active anemia -Ferritin 118, no significant iron deficiency at this time - no indication for IV iron -Oral iron was reportedly discontinued by the patient's PCP due to constipation - we can watch and wait over the next 6 months to decide if it needs to be restarted at her next visit -Patient instructed to decrease vitamin B12 to every other day due to elevated B12 levels - She will follow-up in 6 months with repeat labs   Follow Up Instructions: -Labs in 6 months (CBC, ferritin, iron/TIBC, B12) -RTC in 6 months    I discussed the assessment and treatment plan with the patient. The patient was provided an opportunity to ask questions and all were answered. The patient agreed with the plan and demonstrated an understanding of the instructions.   The patient was advised to call back or seek an in-person evaluation if the symptoms worsen or if the condition fails to improve as anticipated.  I provided 15 minutes of non-face-to-face time during this encounter.   Harriett Rush, PA-C

## 2020-06-18 ENCOUNTER — Ambulatory Visit (INDEPENDENT_AMBULATORY_CARE_PROVIDER_SITE_OTHER): Payer: Medicare Other

## 2020-06-18 VITALS — Ht 65.0 in | Wt 200.0 lb

## 2020-06-18 DIAGNOSIS — Z0000 Encounter for general adult medical examination without abnormal findings: Secondary | ICD-10-CM | POA: Diagnosis not present

## 2020-06-18 NOTE — Patient Instructions (Signed)
[name] , Thank you for taking time to come for your Medicare Wellness Visit. I appreciate your ongoing commitment to your health goals. Please review the following plan we discussed and let me know if I can assist you in the future.   Screening recommendations/referrals: Colonoscopy: Done 07/03/2015 - no longer required due to age Mammogram: Done 02/13/2018 - no longer required, but recommended annually Bone Density: Done 10/13/2016 - Repeat every 2 years *past due* Recommended yearly ophthalmology/optometry visit for glaucoma screening and checkup Recommended yearly dental visit for hygiene and checkup  Vaccinations: Influenza vaccine: Done 01/20/2020 -Repeat annually Pneumococcal vaccine: Done 10/22/2010 & 12/20/2013 Tdap vaccine: Done 09/05/2012 - Repeat every 10 years Shingles vaccine: Done 11/29/2018 & 01/29/2019   Covid-19: J&J Done 06/10/2019 - Due for booster  Advanced directives: Advance directive discussed with you today. Even though you declined this today, please call our office should you change your mind, and we can give you the proper paperwork for you to fill out.  Conditions/risks identified: Continue practicing fall prevention - try the balance and strength exercises, put a hand rail on the front porch stairs - use your cane or walker at all times. Drink 6-8 glasses of water each day and try to eat plenty of fruits and vegetables.  Next appointment: Follow up in one year for your annual wellness visit    Preventive Care 65 Years and Older, Female Preventive care refers to lifestyle choices and visits with your health care provider that can promote health and wellness. What does preventive care include?  A yearly physical exam. This is also called an annual well check.  Dental exams once or twice a year.  Routine eye exams. Ask your health care provider how often you should have your eyes checked.  Personal lifestyle choices, including:  Daily care of your teeth and  gums.  Regular physical activity.  Eating a healthy diet.  Avoiding tobacco and drug use.  Limiting alcohol use.  Practicing safe sex.  Taking low-dose aspirin every day.  Taking vitamin and mineral supplements as recommended by your health care provider. What happens during an annual well check? The services and screenings done by your health care provider during your annual well check will depend on your age, overall health, lifestyle risk factors, and family history of disease. Counseling  Your health care provider may ask you questions about your:  Alcohol use.  Tobacco use.  Drug use.  Emotional well-being.  Home and relationship well-being.  Sexual activity.  Eating habits.  History of falls.  Memory and ability to understand (cognition).  Work and work Statistician.  Reproductive health. Screening  You may have the following tests or measurements:  Height, weight, and BMI.  Blood pressure.  Lipid and cholesterol levels. These may be checked every 5 years, or more frequently if you are over 27 years old.  Skin check.  Lung cancer screening. You may have this screening every year starting at age 26 if you have a 30-pack-year history of smoking and currently smoke or have quit within the past 15 years.  Fecal occult blood test (FOBT) of the stool. You may have this test every year starting at age 103.  Flexible sigmoidoscopy or colonoscopy. You may have a sigmoidoscopy every 5 years or a colonoscopy every 10 years starting at age 80.  Hepatitis C blood test.  Hepatitis B blood test.  Sexually transmitted disease (STD) testing.  Diabetes screening. This is done by checking your blood sugar (glucose) after  you have not eaten for a while (fasting). You may have this done every 1-3 years.  Bone density scan. This is done to screen for osteoporosis. You may have this done starting at age 45.  Mammogram. This may be done every 1-2 years. Talk to your  health care provider about how often you should have regular mammograms. Talk with your health care provider about your test results, treatment options, and if necessary, the need for more tests. Vaccines  Your health care provider may recommend certain vaccines, such as:  Influenza vaccine. This is recommended every year.  Tetanus, diphtheria, and acellular pertussis (Tdap, Td) vaccine. You may need a Td booster every 10 years.  Zoster vaccine. You may need this after age 39.  Pneumococcal 13-valent conjugate (PCV13) vaccine. One dose is recommended after age 79.  Pneumococcal polysaccharide (PPSV23) vaccine. One dose is recommended after age 68. Talk to your health care provider about which screenings and vaccines you need and how often you need them. This information is not intended to replace advice given to you by your health care provider. Make sure you discuss any questions you have with your health care provider. Document Released: 03/13/2015 Document Revised: 11/04/2015 Document Reviewed: 12/16/2014 Elsevier Interactive Patient Education  2017 [employer] Prevention in the Home Falls can cause injuries. They can happen to people of all ages. There are many things you can do to make your home safe and to help prevent falls. What can I do on the outside of my home?  Regularly fix the edges of walkways and driveways and fix any cracks.  Remove anything that might make you trip as you walk through a door, such as a raised step or threshold.  Trim any bushes or trees on the path to your home.  Use bright outdoor lighting.  Clear any walking paths of anything that might make someone trip, such as rocks or tools.  Regularly check to see if handrails are loose or broken. Make sure that both sides of any steps have handrails.  Any raised decks and porches should have guardrails on the edges.  Have any leaves, snow, or ice cleared regularly.  Use sand or salt on walking  paths during winter.  Clean up any spills in your garage right away. This includes oil or grease spills. What can I do in the bathroom?  Use night lights.  Install grab bars by the toilet and in the tub and shower. Do not use towel bars as grab bars.  Use non-skid mats or decals in the tub or shower.  If you need to sit down in the shower, use a plastic, non-slip stool.  Keep the floor dry. Clean up any water that spills on the floor as soon as it happens.  Remove soap buildup in the tub or shower regularly.  Attach bath mats securely with double-sided non-slip rug tape.  Do not have throw rugs and other things on the floor that can make you trip. What can I do in the bedroom?  Use night lights.  Make sure that you have a light by your bed that is easy to reach.  Do not use any sheets or blankets that are too big for your bed. They should not hang down onto the floor.  Have a firm chair that has side arms. You can use this for support while you get dressed.  Do not have throw rugs and other things on the floor that can make you trip.  What can I do in the kitchen?  Clean up any spills right away.  Avoid walking on wet floors.  Keep items that you use a lot in easy-to-reach places.  If you need to reach something above you, use a strong step stool that has a grab bar.  Keep electrical cords out of the way.  Do not use floor polish or wax that makes floors slippery. If you must use wax, use non-skid floor wax.  Do not have throw rugs and other things on the floor that can make you trip. What can I do with my stairs?  Do not leave any items on the stairs.  Make sure that there are handrails on both sides of the stairs and use them. Fix handrails that are broken or loose. Make sure that handrails are as long as the stairways.  Check any carpeting to make sure that it is firmly attached to the stairs. Fix any carpet that is loose or worn.  Avoid having throw rugs at  the top or bottom of the stairs. If you do have throw rugs, attach them to the floor with carpet tape.  Make sure that you have a light switch at the top of the stairs and the bottom of the stairs. If you do not have them, ask someone to add them for you. What else can I do to help prevent falls?  Wear shoes that:  Do not have high heels.  Have rubber bottoms.  Are comfortable and fit you well.  Are closed at the toe. Do not wear sandals.  If you use a stepladder:  Make sure that it is fully opened. Do not climb a closed stepladder.  Make sure that both sides of the stepladder are locked into place.  Ask someone to hold it for you, if possible.  Clearly mark and make sure that you can see:  Any grab bars or handrails.  First and last steps.  Where the edge of each step is.  Use tools that help you move around (mobility aids) if they are needed. These include:  Canes.  Walkers.  Scooters.  Crutches.  Turn on the lights when you go into a dark area. Replace any light bulbs as soon as they burn out.  Set up your furniture so you have a clear path. Avoid moving your furniture around.  If any of your floors are uneven, fix them.  If there are any pets around you, be aware of where they are.  Review your medicines with your doctor. Some medicines can make you feel dizzy. This can increase your chance of falling. Ask your doctor what other things that you can do to help prevent falls. This information is not intended to replace advice given to you by your health care provider. Make sure you discuss any questions you have with your health care provider. Document Released: 12/11/2008 Document Revised: 07/23/2015 Document Reviewed: 03/21/2014 Elsevier Interactive Patient Education  2017 [employer].
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
## 2020-06-22 ENCOUNTER — Telehealth: Payer: Medicare Other

## 2020-06-23 ENCOUNTER — Ambulatory Visit (INDEPENDENT_AMBULATORY_CARE_PROVIDER_SITE_OTHER): Payer: Medicare Other | Admitting: Pharmacist

## 2020-06-23 ENCOUNTER — Other Ambulatory Visit: Payer: Self-pay

## 2020-06-23 DIAGNOSIS — E785 Hyperlipidemia, unspecified: Secondary | ICD-10-CM | POA: Diagnosis not present

## 2020-06-23 DIAGNOSIS — F419 Anxiety disorder, unspecified: Secondary | ICD-10-CM

## 2020-06-23 MED ORDER — REPATHA SURECLICK 140 MG/ML ~~LOC~~ SOAJ: 140.0000 mg | SUBCUTANEOUS | 3 refills | Status: DC

## 2020-06-23 MED ORDER — GABAPENTIN 300 MG PO CAPS: 600.0000 mg | ORAL_CAPSULE | Freq: Two times a day (BID) | ORAL | 2 refills | Status: DC

## 2020-06-23 NOTE — Progress Notes (Signed)
06/23/2020 Name: Vinnie Bobst MRN: [id number] DOB: 11-27-1945  HPI:  Denise Gardner is a 75 y.o. female patient referred to lipid clinic by PCP/cardiology.  Cardiology would like to start PCSK9.  Access is limited due to cost.  PMH is significant for:  Past Medical History:  Diagnosis Date  . Anxiety    depression  . Asthma   . Cataract   . Chronic back pain   . Coronary artery disease    Taxus stent to RCA 2008 2.5 x 16, non obstructive 15% proximal right coronary artery, patent curcumflex LAD, preserved LV  . Diabetes mellitus without complication ([place])   . GERD (gastroesophageal reflux disease)   . Hyperlipidemia   . Hypertension   . Insomnia   . Migraine   . Neuropathy   . Shingles   . Thyroid disease   . Ulcer, stomach peptic     Intolerances:  Statins, Crestor [rosuvastatin], and Ezetimibe   Risk Factors: HTN, COPD, T2DM  LDL goal: <100  Diet: recommended low fat, increase fiber, mediterranean diet (hand out given)  Exercise: n/a; difficulty ambulating  Current Outpatient Medications on File Prior to Visit  Medication Sig Dispense Refill  . Accu-Chek FastClix Lancets MISC CHECK BLOOD SUGAR UP TO FOUR TIMES DAILY Dx E11.69 408 each 3  . acetaminophen (TYLENOL) 500 MG tablet Take 500 mg by mouth 2 (two) times daily. Pt takes 2 tablets at bedtime    . amLODipine (NORVASC) 10 MG tablet TAKE 1 TABLET BY MOUTH DAILY 90 tablet 1  . Ascorbic Acid (VITAMIN C) 100 MG tablet Take 100 mg by mouth daily.    Marland Kitchen aspirin 81 MG chewable tablet Chew 81 mg by mouth daily.    . Biotin 5000 MCG TABS Take by mouth.    . cholecalciferol (VITAMIN D) 400 UNITS TABS Take by mouth. VITAMIN D3 daily     . cyanocobalamin 100 MCG tablet Take 100 mcg by mouth daily.    Marland Kitchen dextromethorphan-guaiFENesin (MUCINEX DM) 30-600 MG 12hr tablet Take 1 tablet by mouth daily as needed for cough.    . diazepam (VALIUM) 5 MG tablet Take 1 tablet (5 mg total) by mouth 2 (two) times daily. Pt takes one  tablet at daytime and one tablet at bedtime 45 tablet 2  . escitalopram (LEXAPRO) 10 MG tablet TAKE 1 TABLET BY MOUTH DAILY 90 tablet 0  . FLOVENT HFA 110 MCG/ACT inhaler INHALE 2 PUFFS INTO THE LUNGS TWICE DAILY 12 g 0  . fluticasone (FLONASE) 50 MCG/ACT nasal spray SHAKE LIQUID AND USE 2 SPRAYS IN EACH NOSTRIL DAILY 48 g 1  . gabapentin (NEURONTIN) 300 MG capsule Take 1 capsule (300 mg total) by mouth 3 (three) times daily. (Patient taking differently: Take 300 mg by mouth 3 (three) times daily. Taking 1 in the morning and 2 at bedtime) 270 capsule 2  . Garlic 10 MG CAPS Take by mouth.    Marland Kitchen glucose blood (ONETOUCH ULTRA) test strip Test BS BID dx E11.9 200 strip 3  . hydrOXYzine (VISTARIL) 25 MG capsule TAKE 1 CAPSULE(25 MG) BY MOUTH THREE TIMES DAILY AS NEEDED 90 capsule 1  . ipratropium-albuterol (DUONEB) 0.5-2.5 (3) MG/3ML SOLN USE 1 VIAL VIA NEBULIZER EVERY 6 HOURS 1080 mL 0  . isosorbide mononitrate (IMDUR) 120 MG 24 hr tablet Take 2 tablets (240 mg total) by mouth daily. (Patient taking differently: Take 120 mg by mouth daily.) 60 tablet 11  . levothyroxine (SYNTHROID) 100 MCG tablet Take 1 tablet (100  mcg total) by mouth daily. (Patient taking differently: Take 100 mcg by mouth daily before breakfast.) 90 tablet 1  . metFORMIN (GLUCOPHAGE-XR) 500 MG 24 hr tablet TAKE 2 TABLETS(1000 MG) BY MOUTH DAILY WITH BREAKFAST 180 tablet 0  . metoprolol tartrate (LOPRESSOR) 25 MG tablet Take 1 tablet (25 mg total) by mouth 2 (two) times daily. 180 tablet 2  . montelukast (SINGULAIR) 10 MG tablet TAKE 1 TABLET(10 MG) BY MOUTH AT BEDTIME 90 tablet 3  . Multiple Vitamins-Minerals (HAIR SKIN AND NAILS FORMULA PO) Take 1 tablet by mouth daily.    . nitroGLYCERIN (NITROSTAT) 0.4 MG SL tablet DISSOLVE 1 TABLET UNDER THE TONGUE EVERY 5 MINUTES AS  NEEDED FOR CHEST PAIN. MAX  OF 3 TABLETS IN 15 MINUTES. CALL 911 IF PAIN PERSISTS. 100 tablet 3  . omeprazole (PRILOSEC) 20 MG capsule TAKE 1 CAPSULE BY MOUTH TWICE  DAILY BEFORE A MEAL 180 capsule 1  . raloxifene (EVISTA) 60 MG tablet TAKE 1 TABLET(60 MG) BY MOUTH DAILY 90 tablet 0  . sitaGLIPtin (JANUVIA) 100 MG tablet TAKE 1 TABLET BY MOUTH EVERY DAY 90 tablet 0  . solifenacin (VESICARE) 10 MG tablet Take 1 tablet (10 mg total) by mouth daily. 90 tablet 1  . VASCEPA 1 g capsule TAKE 4 CAPSULES BY MOUTH EVERY DAY 360 capsule 0  . zinc gluconate 50 MG tablet Take 50 mg by mouth daily.     No current facility-administered medications on file prior to visit.     Allergies  Allergen Reactions  . Milk-Related Compounds Itching and Nausea And Vomiting  . Eggs Or Egg-Derived Products Itching, Nausea And Vomiting and Other (See Comments)    headaches  . Lac Bovis Other (See Comments)    Per PCP office  . Other Other (See Comments)    Other reaction(s): Other (See Comments) Per [facility] Other reaction(s): Other (See Comments) Per [facility] Per [facility]  . Statins   . Crestor [Rosuvastatin]     Myalgias   . Ezetimibe Other (See Comments)    mylagia Per PCP office    Assessment/Plan: Lipid Panel     Component Value Date/Time   CHOL 267 (H) 03/19/2019 1210   CHOL 136 09/05/2012 1256   TRIG 112 03/19/2019 1210   TRIG 43 12/28/2012 0938   TRIG 38 09/05/2012 1256   HDL 58 03/19/2019 1210   HDL 66 12/28/2012 0938   HDL 59 09/05/2012 1256   CHOLHDL 4.6 (H) 03/19/2019 1210   LDLCALC 189 (H) 03/19/2019 1210   LDLCALC 80 12/28/2012 0938   LDLCALC 69 09/05/2012 1256   LABVLDL 20 03/19/2019 1210     1. Hyperlipidemia -  Patient in agreement to start Repatha 140mg  q2 weeks RX sent to [facility] [place] Application sent to [place] for [employer] submitted for Repatha below:    Regina Eck, PharmD, BCPS Clinical Pharmacist, [place]  II Phone [phone]

## 2020-06-24 ENCOUNTER — Telehealth: Payer: Self-pay | Admitting: Pharmacist

## 2020-06-24 NOTE — Telephone Encounter (Signed)
PA for repatha submitted via cover my meds

## 2020-06-25 ENCOUNTER — Other Ambulatory Visit: Payer: Self-pay | Admitting: Family

## 2020-06-29 ENCOUNTER — Ambulatory Visit (INDEPENDENT_AMBULATORY_CARE_PROVIDER_SITE_OTHER): Payer: Medicare Other | Admitting: *Deleted

## 2020-06-29 ENCOUNTER — Telehealth: Payer: Self-pay

## 2020-06-29 DIAGNOSIS — E1142 Type 2 diabetes mellitus with diabetic polyneuropathy: Secondary | ICD-10-CM

## 2020-06-29 DIAGNOSIS — E785 Hyperlipidemia, unspecified: Secondary | ICD-10-CM

## 2020-06-29 NOTE — Patient Instructions (Signed)
Visit Information  PATIENT GOALS: Goals Addressed            This Visit's Progress   . Manage Hyperlipidemia   On track    Timeframe:  Long-Range Goal Priority:  Medium Start Date:     06/29/20                        Expected End Date:  06/29/21                      Follow-up: 07/31/20  . Patient will self administer medications as prescribed . Patient will attend all scheduled provider appointments . Patient will call provider office for new concerns or questions . Get outside and walk daily . Follow a heart-healthy, Mediterranean Diet . Call RN Care Manager as needed [phone]    . COMPLETED: Manage My Medicine       Timeframe:  Long-Range Goal Priority:  High Start Date:   05/12/20                          Expected End Date: 02/27/21                      Follow Up Date 06/29/20   . Start taking levothyroxine 100 mcg . Take levothyroxine on an empty stomach 30 minutes before breakfast and other medications . Do not take levothyroxine with omeprazole . Take omeprazole 30 minutes before lunch and 30 min before supper. If only taking once a day, take 30 minute before supper.  . Talk with PCP about joint pain control since you're stopping naproxen . Talk with PCP about dry mouth since starting vesicare . Call RN Care Manager with any questions about your medications   Why is this important?   . These steps will help you keep on track with your medicines.   Notes:     Marland Kitchen Monitor and Manage My Blood Sugar-Diabetes Type 2   On track    Timeframe:  Long-Range Goal Priority:  Medium Start Date:  05/12/20                           Expected End Date:  02/27/21                      Follow Up Date 06/29/20   . Keep appointment with PCP on 07/20/20 . Call PCP with any blood sugar readings outside of recommended range . Continue to avoid sugars and simple carbohydrates . Take all medication as directed . Take blood sugar log and meter to PCP appointment . Have clinical staff compare  blood sugar reading with your meter to the office meter . Get outside for a walk daily . Call [facility] as needed [phone]   Why is this important?    Checking your blood sugar at home helps to keep it from getting very high or very low.   Writing the results in a diary or log helps the doctor know how to care for you.   Your blood sugar log should have the time, date and the results.   Also, write down the amount of insulin or other medicine that you take.   Other information, like what you ate, exercise done and how you were feeling, will also be helpful.     Notes:  Patient verbalizes understanding of instructions provided today and agrees to view in [place].   Follow Up Plan:  . Telephone follow up appointment with care management team member scheduled for: 07/31/20 . The patient has been provided with contact information for the care management team and has been advised to call with any health related questions or concerns.  . Next PCP appointment scheduled for: 07/20/20 with Evelina Dun, FNP   Chong Sicilian, BSN, RN-BC [place] [facility] [place] Management Direct Dial: [phone]

## 2020-06-29 NOTE — Chronic Care Management (AMB) (Signed)
Chronic Care Management   CCM RN Visit Note  06/29/2020 Name: Denise Gardner MRN: [id number] DOB: 1945/04/02  Subjective: Denise Gardner is a 75 y.o. year old female who is a primary care patient of Denise Balloon, FNP. The care management team was consulted for assistance with disease management and care coordination needs.    Engaged with patient by telephone for follow up visit in response to provider referral for case management and/or care coordination services.   Consent to Services:  The patient was given information about Chronic Care Management services, agreed to services, and gave verbal consent prior to initiation of services.  Please see initial visit note for detailed documentation.   Patient agreed to services and verbal consent obtained.   Assessment: Review of patient past medical history, allergies, medications, health status, including review of consultants reports, laboratory and other test data, was performed as part of comprehensive evaluation and provision of chronic care management services.   SDOH (Social Determinants of Health) assessments and interventions performed:    CCM Care Plan  Allergies  Allergen Reactions  . Milk-Related Compounds Itching and Nausea And Vomiting  . Eggs Or Egg-Derived Products Itching, Nausea And Vomiting and Other (See Comments)    headaches  . Lac Bovis Other (See Comments)    Per PCP office  . Other Other (See Comments)    Other reaction(s): Other (See Comments) Per [facility] Other reaction(s): Other (See Comments) Per [facility] Per [facility]  . Statins   . Crestor [Rosuvastatin]     Myalgias   . Ezetimibe Other (See Comments)    mylagia Per PCP office    Outpatient Encounter Medications as of 06/29/2020  Medication Sig  . Accu-Chek FastClix Lancets MISC CHECK BLOOD SUGAR UP TO FOUR TIMES DAILY Dx E11.69  . acetaminophen (TYLENOL) 500 MG tablet Take 500 mg by mouth 2 (two) times daily. Pt takes 2  tablets at bedtime  . amLODipine (NORVASC) 10 MG tablet TAKE 1 TABLET BY MOUTH DAILY  . Ascorbic Acid (VITAMIN C) 100 MG tablet Take 100 mg by mouth daily.  Marland Kitchen aspirin 81 MG chewable tablet Chew 81 mg by mouth daily.  . Biotin 5000 MCG TABS Take by mouth.  . cholecalciferol (VITAMIN D) 400 UNITS TABS Take by mouth. VITAMIN D3 daily   . cyanocobalamin 100 MCG tablet Take 100 mcg by mouth daily.  Marland Kitchen dextromethorphan-guaiFENesin (MUCINEX DM) 30-600 MG 12hr tablet Take 1 tablet by mouth daily as needed for cough.  . diazepam (VALIUM) 5 MG tablet Take 1 tablet (5 mg total) by mouth 2 (two) times daily. Pt takes one tablet at daytime and one tablet at bedtime  . escitalopram (LEXAPRO) 10 MG tablet TAKE 1 TABLET BY MOUTH DAILY  . Evolocumab (REPATHA SURECLICK) 683 MG/ML SOAJ Inject 140 mg into the skin every 14 (fourteen) days.  Marland Kitchen FLOVENT HFA 110 MCG/ACT inhaler INHALE 2 PUFFS INTO THE LUNGS TWICE DAILY  . fluticasone (FLONASE) 50 MCG/ACT nasal spray SHAKE LIQUID AND USE 2 SPRAYS IN EACH NOSTRIL DAILY  . gabapentin (NEURONTIN) 300 MG capsule Take 2 capsules (600 mg total) by mouth 2 (two) times daily.  . Garlic 10 MG CAPS Take by mouth.  Marland Kitchen glucose blood (ONETOUCH ULTRA) test strip Test BS BID dx E11.9  . hydrOXYzine (VISTARIL) 25 MG capsule TAKE 1 CAPSULE(25 MG) BY MOUTH THREE TIMES DAILY AS NEEDED  . ipratropium-albuterol (DUONEB) 0.5-2.5 (3) MG/3ML SOLN USE 1 VIAL VIA NEBULIZER EVERY 6 HOURS  .  isosorbide mononitrate (IMDUR) 120 MG 24 hr tablet Take 2 tablets (240 mg total) by mouth daily. (Patient taking differently: Take 120 mg by mouth daily.)  . levothyroxine (SYNTHROID) 100 MCG tablet Take 1 tablet (100 mcg total) by mouth daily. (Patient taking differently: Take 100 mcg by mouth daily before breakfast.)  . metFORMIN (GLUCOPHAGE-XR) 500 MG 24 hr tablet TAKE 2 TABLETS(1000 MG) BY MOUTH DAILY WITH BREAKFAST  . metoprolol tartrate (LOPRESSOR) 25 MG tablet Take 1 tablet (25 mg total) by mouth 2 (two)  times daily.  . montelukast (SINGULAIR) 10 MG tablet TAKE 1 TABLET(10 MG) BY MOUTH AT BEDTIME  . Multiple Vitamins-Minerals (HAIR SKIN AND NAILS FORMULA PO) Take 1 tablet by mouth daily.  . nitroGLYCERIN (NITROSTAT) 0.4 MG SL tablet DISSOLVE 1 TABLET UNDER THE TONGUE EVERY 5 MINUTES AS  NEEDED FOR CHEST PAIN. MAX  OF 3 TABLETS IN 15 MINUTES. CALL 911 IF PAIN PERSISTS.  Marland Kitchen omeprazole (PRILOSEC) 20 MG capsule TAKE 1 CAPSULE BY MOUTH TWICE DAILY BEFORE A MEAL  . raloxifene (EVISTA) 60 MG tablet TAKE 1 TABLET(60 MG) BY MOUTH DAILY  . sitaGLIPtin (JANUVIA) 100 MG tablet TAKE 1 TABLET BY MOUTH EVERY DAY  . solifenacin (VESICARE) 10 MG tablet Take 1 tablet (10 mg total) by mouth daily.  Marland Kitchen VASCEPA 1 g capsule TAKE 4 CAPSULES BY MOUTH EVERY DAY  . zinc gluconate 50 MG tablet Take 50 mg by mouth daily.   No facility-administered encounter medications on file as of 06/29/2020.    Patient Active Problem List   Diagnosis Date Noted  . Precordial chest pain 03/03/2020  . Controlled substance agreement signed 08/16/2018  . Myalgia 11/24/2017  . Iron deficiency anemia 05/26/2017  . Vitamin B 12 deficiency 05/26/2017  . Obesity (BMI 30-39.9) 05/25/2017  . Constipation 05/19/2016  . Hypokalemia 04/30/2015  . Depression 07/18/2014  . Diabetic neuropathy ([place]) 04/17/2014  . OAB (overactive bladder) 04/17/2014  . Peripheral neuropathy 04/17/2014  . Hypothyroidism 12/27/2012  . Osteopenia 05/30/2012  . Type 2 diabetes mellitus ([place]) 05/21/2012  . Asthma 05/21/2012  . Chronic obstructive pulmonary disease ([place]) 05/21/2012  . Gastroesophageal reflux disease 05/21/2012  . Hyperlipidemia associated with type 2 diabetes mellitus ([place]) 01/26/2011  . Hypertension associated with diabetes ([place])   . Anxiety   . Chronic coronary artery disease     Conditions to be addressed/monitored:HLD and DMII  Care Plan : RNCM: Wellness (Adult)  Updates made by Ilean China, RN since 06/29/2020 12:00 AM  Completed  06/29/2020  Problem: Medication Adherence (Wellness) Resolved 06/29/2020  Priority: High    Long-Range Goal: Medication Adherence Maintained Completed 06/29/2020  Start Date: 05/12/2020  Recent Progress: On track  Priority: High  Note:   Objective: Lab Results  Component Value Date   CHOL 267 (H) 03/19/2019   HDL 58 03/19/2019   LDLCALC 189 (H) 03/19/2019   TRIG 112 03/19/2019   CHOLHDL 4.6 (H) 03/19/2019   Current Barriers:  Marland Kitchen Knowledge Deficits related to medications and what they are used to treat . Lacks caregiver support.  . 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 patient will verbalize understanding of plan for medication management . patient will meet with RN Care Manager to address questions and needs related to medication management . Patient will take prescribed medication as directed   Interventions:  . 1:1 collaboration with Denise Balloon, FNP regarding development and update of comprehensive plan of care as evidenced by provider  attestation and co-signature . Inter-disciplinary care team collaboration (see longitudinal plan of care) . Chart reviewed including recent office notes and lab results . Reviewed and extensively discussed medications with patient o Compared medication list in [facility] with patient's prescription bottles at home o Updated medication list to reflect what patient is actually taking o Discussed change in levothyroxine from 84mcg to 175mcg at last office visit on 04/21/20 due to elevated TSH - Until today, patient has been taking 88 mcg but does have the 100 mcg - She did remove the 88 mcg from her pill box and replaced with 100 mcg while on the phone with me . She's going to put the bottle of 88 mcg away . Discussed medication organization  o Patient uses a weekly pill box to organize meds . Discussed need to repeat TSH at next visit with PCP on 07/20/20 . Discussed use of medication for joint  pain o Previously advised to d/c naproxen due to elevated kidney functions o Collaborated with PCP about this and [facility] clinical staff also talked with patient and advised to d/c o She is taking 2 tylenol at bedtime for pain . Discussed use of omeprazole and levothyroxine o Reinforced need to take levothyroxine 30 minutes before breakfast and any other medications o Reinforced to avoid taking omeprazole with levothyroxine - Reminded that she can take it 30 minutes before lunch and 30 minute before supper . Discussed use of Vesicare for incontinence and overactive bladder o Patient isn't seeing a benefit and it is drying her mouth out. Considering discontinuing it.  . Provided with RNCM contact number and encouraged to reach out as needed . Reviewed upcoming appointment with PCP on 07/20/20  Patient Goals/Self-Care Activities Over the next 30 days, patient will: . Start taking levothyroxine 100 mcg . Take levothyroxine on an empty stomach 30 minutes before breakfast and other medications . Do not take levothyroxine with omeprazole . Take omeprazole 30 minutes before lunch and 30 min before supper. If only taking once a day, take 30 minute before supper.  . Talk with PCP about joint pain control since you're stopping naproxen . Talk with PCP about dry mouth since starting vesicare . Call [place] with any questions about your medications  Follow Up Plan:  . Telephone follow up appointment with care management team member scheduled for: 06/29/20 with RNCM, LCSW 4/25 . The patient has been provided with contact information for the care management team and has been advised to call with any health related questions or concerns.  . Next PCP appointment scheduled for: 07/20/20 with Evelina Dun, FNP . Next AWV 06/18/20 . Next appointment with oncology department is 4/6 for labs and 4/13 for virtual visit        Care Plan : RNCM:Diabetes Type 2 (Adult)  Updates made by Ilean China,  RN since 06/29/2020 12:00 AM    Problem: RNCM: Glycemic Management (Diabetes, Type 2)   Priority: Medium    Long-Range Goal: Glycemic Management Optimized   Start Date: 05/12/2020  This Visit's Progress: On track  Recent Progress: On track  Priority: Medium  Note:   Objective: Lab Results  Component Value Date   HGBA1C 6.7 04/21/2020   HGBA1C 6.4 01/20/2020   HGBA1C 6.4 10/17/2019   Lab Results  Component Value Date   MICROALBUR 20 04/17/2014   LDLCALC 189 (H) 03/19/2019   CREATININE 1.25 (H) 06/03/2020   Current Barriers:  . Chronic Disease Management support and education needs related to diabetes  in a patient with hypertension, hyperlipidemia, and elevated kidney functions . Lacks caregiver support.  Karen Kays to independently drive  Nurse Case Manager Clinical Goal(s):  . patient will work with PCP to address needs related to medical management of diabetes . patient will meet with RN Care Manager to address self-management of diabetes  Interventions:  . 1:1 collaboration with Denise Balloon, FNP regarding development and update of comprehensive plan of care as evidenced by provider attestation and co-signature . Inter-disciplinary care team collaboration (see longitudinal plan of care) . Evaluation of current treatment plan related to diabetes and patient's adherence to plan as established by provider. . Chart reviewed including relevant office notes and lab results o Discussed recent A1C and kidney functions . Reviewed and discussed medications o Should be avoiding NSAIDs o Compliant with medications and using a weekly pill box to organize them . Discussed home blood sugar testing o Testing twice a day o Blood sugar ranges from 115 to 200 o No readings below 70 o 117 fasting this morning . Encouraged patient to continue checking blood sugar twice a day and PRN and to write the results down in a log . Advised to bring log to PCP appointment for review . Reinforced  carb modified diet  . Encouraged to continue getting outside daily for a walk . Provided with RNCM contact number and encouraged to reach out as needed . Reviewed upcoming appointment with PCP and cancer center  Patient Goals/Self-Care Activities Over the next 60 days, patient will: . Keep appointment with PCP on 07/20/20 . Call PCP with any blood sugar readings outside of recommended range . Continue to avoid sugars and simple carbohydrates . Take all medication as directed . Take blood sugar log and meter to PCP appointment . Have clinical staff compare blood sugar reading with your meter to the office meter . Get outside for a walk daily . Call [facility] as needed [phone]    Care Plan : RNCM: Hyperlipidemia  Updates made by Ilean China, RN since 06/29/2020 12:00 AM    Problem: Uncontrolled Hyperlipidemia   Priority: Medium    Long-Range Goal: Manage Hyperlipidemia   Start Date: 06/29/2020  This Visit's Progress: On track  Priority: Medium  Note:   Current Barriers:  . Chronic Disease Management support and education needs related to hyperlipidemia . 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 patient will work with cardiologist/PCP/PharmD to address needs related to medical management of hyperlipidemia . patient will meet with RN Care Manager to address self-management of hyperlipidemia . the patient will demonstrate ongoing self health care management ability as evidenced by starting Repatha injections and keeping follow-up medical appointments*  Interventions:  . 1:1 collaboration with Claretta Fraise, MD regarding development and update of comprehensive plan of care as evidenced by provider attestation and co-signature . Inter-disciplinary care team collaboration (see longitudinal plan of care) . Evaluation of current treatment plan related to hyperlipidemia and patient's adherence to plan as established by  provider. . Chart reviewed including relevant office notes, telephone notes, and lab results . Reviewed and discussed medications o Repatha has been approved for insurance coverage o Patient reports it is around $10 at [employer] . Reinforced Mediterranean diet that PharmD recommended . Reviewed upcoming appointments and encouraged patient to keep all follow-up appointments . Advised to call PCP/PharmD with any questions or concerns regarding Repatha . Encouraged to continue walking for exercise daily as tolerated . Provided  with RNCM contact information and encouraged to reach out as needed  Self Care Activities:  . Self administers medications as prescribed . Calls pharmacy for medication refills . Calls provider office for new concerns or questions  Patient Goals Over the next 30 days, patient will: . Patient will self administer medications as prescribed . Patient will attend all scheduled provider appointments . Patient will call provider office for new concerns or questions . Get outside and walk daily . Follow a heart-healthy, Mediterranean Diet . Call RN Care Manager as needed [phone]   Follow Up Plan:  . Telephone follow up appointment with care management team member scheduled for: 07/31/20 . The patient has been provided with contact information for the care management team and has been advised to call with any health related questions or concerns.  . Next PCP appointment scheduled for: 07/20/20 with Evelina Dun, FNP   Chong Sicilian, BSN, RN-BC [place] [facility] Direct Dial: [phone]

## 2020-07-01 ENCOUNTER — Telehealth: Payer: Self-pay

## 2020-07-01 NOTE — Telephone Encounter (Signed)
Spoke with patient and advised her we are checking into this.  Abigail Butts spoke with Denise Gardner and she reports we can charge patient for the injection.  The problem is we have no MAR to document this in.  Abigail Butts is going to check on this and we will get back to the patient.

## 2020-07-02 ENCOUNTER — Ambulatory Visit: Payer: Medicare Other

## 2020-07-20 ENCOUNTER — Encounter: Payer: Self-pay | Admitting: Family

## 2020-07-20 ENCOUNTER — Other Ambulatory Visit: Payer: Self-pay

## 2020-07-20 ENCOUNTER — Ambulatory Visit (INDEPENDENT_AMBULATORY_CARE_PROVIDER_SITE_OTHER): Payer: Medicare Other | Admitting: Family

## 2020-07-20 ENCOUNTER — Ambulatory Visit (INDEPENDENT_AMBULATORY_CARE_PROVIDER_SITE_OTHER): Payer: Medicare Other

## 2020-07-20 VITALS — BP 139/73 | HR 68 | Temp 97.7°F | Ht 65.0 in | Wt 197.4 lb

## 2020-07-20 DIAGNOSIS — Z780 Asymptomatic menopausal state: Secondary | ICD-10-CM | POA: Diagnosis not present

## 2020-07-20 DIAGNOSIS — N3281 Overactive bladder: Secondary | ICD-10-CM | POA: Diagnosis not present

## 2020-07-20 DIAGNOSIS — D509 Iron deficiency anemia, unspecified: Secondary | ICD-10-CM

## 2020-07-20 DIAGNOSIS — E1169 Type 2 diabetes mellitus with other specified complication: Secondary | ICD-10-CM | POA: Diagnosis not present

## 2020-07-20 DIAGNOSIS — Z79899 Other long term (current) drug therapy: Secondary | ICD-10-CM

## 2020-07-20 DIAGNOSIS — E1159 Type 2 diabetes mellitus with other circulatory complications: Secondary | ICD-10-CM

## 2020-07-20 DIAGNOSIS — M8589 Other specified disorders of bone density and structure, multiple sites: Secondary | ICD-10-CM | POA: Diagnosis not present

## 2020-07-20 DIAGNOSIS — F1320 Sedative, hypnotic or anxiolytic dependence, uncomplicated: Secondary | ICD-10-CM | POA: Diagnosis not present

## 2020-07-20 DIAGNOSIS — I152 Hypertension secondary to endocrine disorders: Secondary | ICD-10-CM | POA: Diagnosis not present

## 2020-07-20 DIAGNOSIS — J449 Chronic obstructive pulmonary disease, unspecified: Secondary | ICD-10-CM

## 2020-07-20 DIAGNOSIS — F419 Anxiety disorder, unspecified: Secondary | ICD-10-CM | POA: Diagnosis not present

## 2020-07-20 DIAGNOSIS — E669 Obesity, unspecified: Secondary | ICD-10-CM

## 2020-07-20 DIAGNOSIS — I2510 Atherosclerotic heart disease of native coronary artery without angina pectoris: Secondary | ICD-10-CM | POA: Diagnosis not present

## 2020-07-20 DIAGNOSIS — K219 Gastro-esophageal reflux disease without esophagitis: Secondary | ICD-10-CM

## 2020-07-20 DIAGNOSIS — F331 Major depressive disorder, recurrent, moderate: Secondary | ICD-10-CM

## 2020-07-20 DIAGNOSIS — E1142 Type 2 diabetes mellitus with diabetic polyneuropathy: Secondary | ICD-10-CM | POA: Diagnosis not present

## 2020-07-20 DIAGNOSIS — E039 Hypothyroidism, unspecified: Secondary | ICD-10-CM

## 2020-07-20 DIAGNOSIS — E785 Hyperlipidemia, unspecified: Secondary | ICD-10-CM

## 2020-07-20 LAB — BAYER DCA HB A1C WAIVED: HB A1C (BAYER DCA - WAIVED): 6.5 % (ref <7.0)

## 2020-07-20 MED ORDER — DIAZEPAM 5 MG PO TABS: 5.0000 mg | ORAL_TABLET | Freq: Two times a day (BID) | ORAL | 2 refills | Status: DC

## 2020-07-20 NOTE — Progress Notes (Signed)
Subjective:    Patient ID: Denise Gardner, female    DOB: 20-Jun-1945, 75 y.o.   MRN: [id number]  Chief Complaint  Patient presents with  . Medical Management of Chronic Issues  . Diabetes   Pt presents to the office today for chronic follow up.She is followed byCardiologists annually for CAD. She is followed by Hematologists for iron deficiency anemia and Vit B 12 deficiency. Diabetes She presents for her follow-up diabetic visit. She has type 2 diabetes mellitus. Her disease course has been stable. There are no hypoglycemic associated symptoms. Associated symptoms include blurred vision, fatigue and foot paresthesias. Symptoms are stable. Diabetic complications include heart disease. Risk factors for coronary artery disease include dyslipidemia, diabetes mellitus, hypertension, sedentary lifestyle and post-menopausal. She is following a generally unhealthy diet. Her overall blood glucose range is 180-200 mg/dl. An ACE inhibitor/angiotensin II receptor blocker is being taken.  Hypertension This is a chronic problem. The current episode started more than 1 year ago. The problem has been resolved since onset. The problem is controlled. Associated symptoms include blurred vision, malaise/fatigue, peripheral edema and shortness of breath. Risk factors for coronary artery disease include dyslipidemia, diabetes mellitus, obesity and sedentary lifestyle. The current treatment provides moderate improvement. Hypertensive end-organ damage includes heart failure. Identifiable causes of hypertension include a thyroid problem.  Gastroesophageal Reflux She complains of belching, heartburn and a hoarse voice. This is a chronic problem. The current episode started more than 1 year ago. The problem occurs occasionally. The problem has been waxing and waning. Associated symptoms include fatigue. Risk factors include obesity. She has tried a PPI for the symptoms. The treatment provided moderate relief.  Thyroid  Problem Presents for follow-up visit. Symptoms include dry skin, fatigue and hoarse voice. Patient reports no depressed mood or diarrhea. The symptoms have been stable. Her past medical history is significant for heart failure and hyperlipidemia.  Hyperlipidemia This is a chronic problem. The current episode started more than 1 year ago. Exacerbating diseases include obesity. Associated symptoms include shortness of breath. Current antihyperlipidemic treatment includes statins. The current treatment provides moderate improvement of lipids. Risk factors for coronary artery disease include dyslipidemia, diabetes mellitus, hypertension, a sedentary lifestyle and post-menopausal.  Urinary Frequency  This is a chronic problem. The current episode started more than 1 year ago. The problem occurs intermittently. The problem has been waxing and waning. The pain is at a severity of 0/10. The patient is experiencing no pain. Associated symptoms include frequency.  Depression        This is a chronic problem.  The current episode started more than 1 year ago.   The onset quality is gradual.   The problem occurs intermittently.  Associated symptoms include fatigue and sad.  Associated symptoms include no helplessness, no hopelessness, not irritable and no restlessness.  Past medical history includes thyroid problem.    COPD States her breathing is stable.    Review of Systems  Constitutional: Positive for fatigue and malaise/fatigue.  HENT: Positive for hoarse voice.   Eyes: Positive for blurred vision.  Respiratory: Positive for shortness of breath.   Gastrointestinal: Positive for heartburn. Negative for diarrhea.  Genitourinary: Positive for frequency.  Psychiatric/Behavioral: Positive for depression.  All other systems reviewed and are negative.      Objective:   Physical Exam Vitals reviewed.  Constitutional:      General: She is not irritable.She is not in acute distress.    Appearance: She  is well-developed. She is obese.  HENT:  Head: Normocephalic and atraumatic.     Right Ear: Tympanic membrane normal.     Left Ear: Tympanic membrane normal.  Eyes:     Pupils: Pupils are equal, round, and reactive to light.  Neck:     Thyroid: No thyromegaly.  Cardiovascular:     Rate and Rhythm: Normal rate and regular rhythm.     Heart sounds: Normal heart sounds. No murmur heard.   Pulmonary:     Effort: Pulmonary effort is normal. No respiratory distress.     Breath sounds: Decreased breath sounds present. No wheezing.  Abdominal:     General: Bowel sounds are normal. There is no distension.     Palpations: Abdomen is soft.     Tenderness: There is no abdominal tenderness.  Musculoskeletal:        General: No tenderness. Normal range of motion.     Cervical back: Normal range of motion and neck supple.     Right lower leg: Edema (2+) present.     Left lower leg: Edema (2+) present.  Skin:    General: Skin is warm and dry.  Neurological:     Mental Status: She is alert and oriented to person, place, and time.     Cranial Nerves: No cranial nerve deficit.     Motor: Weakness (using cane to walk) present.     Deep Tendon Reflexes: Reflexes are normal and symmetric.  Psychiatric:        Behavior: Behavior normal.        Thought Content: Thought content normal.        Judgment: Judgment normal.       BP 139/73   Pulse 68   Temp 97.7 F (36.5 C) (Temporal)   Ht '5\' 5"'  (1.651 m)   Wt 197 lb 6.4 oz (89.5 kg)   SpO2 97%   BMI 32.85 kg/m      Assessment & Plan:  Quintara Bost comes in today with chief complaint of Medical Management of Chronic Issues and Diabetes   Diagnosis and orders addressed:  1. Anxiety - diazepam (VALIUM) 5 MG tablet; Take 1 tablet (5 mg total) by mouth 2 (two) times daily. Pt takes one tablet at daytime and one tablet at bedtime  Dispense: 45 tablet; Refill: 2 - CMP14+EGFR  2. Benzodiazepine dependence (HCC) - diazepam (VALIUM) 5 MG  tablet; Take 1 tablet (5 mg total) by mouth 2 (two) times daily. Pt takes one tablet at daytime and one tablet at bedtime  Dispense: 45 tablet; Refill: 2 - CMP14+EGFR  3. Hypertension associated with diabetes ([place]) - CMP14+EGFR  4. Chronic coronary artery disease - CMP14+EGFR  5. Chronic obstructive pulmonary disease, unspecified COPD type ([place]) - CMP14+EGFR  6. Gastroesophageal reflux disease, unspecified whether esophagitis present - CMP14+EGFR  7. Hyperlipidemia associated with type 2 diabetes mellitus (HCC)  - CMP14+EGFR  8. Hypothyroidism, unspecified type - CMP14+EGFR - TSH  9. Type 2 diabetes mellitus with diabetic polyneuropathy, without long-term current use of insulin (HCC) - CMP14+EGFR - Bayer DCA Hb A1c Waived  10. OAB (overactive bladder) - CMP14+EGFR  11. Osteopenia of multiple sites - DG [facility] DEXA - CMP14+EGFR  12. Obesity (BMI 30-39.9)  - CMP14+EGFR  13. Iron deficiency anemia, unspecified iron deficiency anemia type - CMP14+EGFR  14. Controlled substance agreement signed - CMP14+EGFR  15. Moderate episode of recurrent major depressive disorder (HCC) - CMP14+EGFR  16. Post-menopausal  - DG [facility] DEXA - CMP14+EGFR   Labs pending Health Maintenance reviewed Diet and  exercise encouraged  Follow up plan: 3 months   Evelina Dun, FNP

## 2020-07-20 NOTE — Patient Instructions (Signed)
Peripheral Edema  Peripheral edema is swelling that is caused by a buildup of fluid. Peripheral edema most often affects the lower legs, ankles, and feet. It can also develop in the arms, hands, and face. The area of the body that has peripheral edema will look swollen. It may also feel heavy or warm. Your clothes may start to feel tight. Pressing on the area may make a temporary dent in your skin. You may not be able to move your swollen arm or leg as much as usual. There are many causes of peripheral edema. It can happen because of a complication of other conditions such as congestive heart failure, kidney disease, or a problem with your blood circulation. It also can be a side effect of certain medicines or because of an infection. It often happens to women during pregnancy. Sometimes, the cause is not known. Follow these instructions at home: Managing pain, stiffness, and swelling  Raise (elevate) your legs while you are sitting or lying down.  Move around often to prevent stiffness and to lessen swelling.  Do not sit or stand for long periods of time.  Wear support stockings as told by your health care provider.   Medicines  Take over-the-counter and prescription medicines only as told by your health care provider.  Your health care provider may prescribe medicine to help your body get rid of excess water (diuretic). General instructions  Pay attention to any changes in your symptoms.  Follow instructions from your health care provider about limiting salt (sodium) in your diet. Sometimes, eating less salt may reduce swelling.  Moisturize skin daily to help prevent skin from cracking and draining.  Keep all follow-up visits as told by your health care provider. This is important. Contact a health care provider if you have:  A fever.  Edema that starts suddenly or is getting worse, especially if you are pregnant or have a medical condition.  Swelling in only one leg.  Increased  swelling, redness, or pain in one or both of your legs.  Drainage or sores at the area where you have edema. Get help right away if you:  Develop shortness of breath, especially when you are lying down.  Have pain in your chest or abdomen.  Feel weak.  Feel faint. Summary  Peripheral edema is swelling that is caused by a buildup of fluid. Peripheral edema most often affects the lower legs, ankles, and feet.  Move around often to prevent stiffness and to lessen swelling. Do not sit or stand for long periods of time.  Pay attention to any changes in your symptoms.  Contact a health care provider if you have edema that starts suddenly or is getting worse, especially if you are pregnant or have a medical condition.  Get help right away if you develop shortness of breath, especially when lying down. This information is not intended to replace advice given to you by your health care provider. Make sure you discuss any questions you have with your health care provider. Document Revised: 11/08/2017 Document Reviewed: 11/08/2017 Elsevier Patient Education  2021 [employer].

## 2020-07-21 ENCOUNTER — Other Ambulatory Visit: Payer: Self-pay | Admitting: Family

## 2020-07-21 LAB — CMP14+EGFR
ALT: 10 IU/L (ref 0–32)
AST: 14 IU/L (ref 0–40)
Albumin/Globulin Ratio: 1.6 (ref 1.2–2.2)
Albumin: 4 g/dL (ref 3.7–4.7)
Alkaline Phosphatase: 38 IU/L — ABNORMAL LOW (ref 44–121)
BUN/Creatinine Ratio: 14 (ref 12–28)
BUN: 14 mg/dL (ref 8–27)
Bilirubin Total: 0.4 mg/dL (ref 0.0–1.2)
CO2: 28 mmol/L (ref 20–29)
Calcium: 9.3 mg/dL (ref 8.7–10.3)
Chloride: 99 mmol/L (ref 96–106)
Creatinine, Ser: 1.03 mg/dL — ABNORMAL HIGH (ref 0.57–1.00)
Globulin, Total: 2.5 g/dL (ref 1.5–4.5)
Glucose: 131 mg/dL — ABNORMAL HIGH (ref 65–99)
Potassium: 4.2 mmol/L (ref 3.5–5.2)
Sodium: 142 mmol/L (ref 134–144)
Total Protein: 6.5 g/dL (ref 6.0–8.5)
eGFR: 56 mL/min/{1.73_m2} — ABNORMAL LOW (ref >59)

## 2020-07-21 LAB — TSH: TSH: 0.941 u[IU]/mL (ref 0.450–4.500)

## 2020-07-24 ENCOUNTER — Telehealth: Payer: Self-pay | Admitting: Family

## 2020-07-24 DIAGNOSIS — M85852 Other specified disorders of bone density and structure, left thigh: Secondary | ICD-10-CM | POA: Diagnosis not present

## 2020-07-24 DIAGNOSIS — Z780 Asymptomatic menopausal state: Secondary | ICD-10-CM | POA: Diagnosis not present

## 2020-07-24 MED ORDER — BLOOD GLUCOSE METER KIT: PACK | 0 refills | Status: DC

## 2020-07-24 NOTE — Telephone Encounter (Signed)
Patient aware and verbalized understanding. °

## 2020-07-24 NOTE — Telephone Encounter (Signed)
Pt would like to discuss her lab results

## 2020-07-28 ENCOUNTER — Other Ambulatory Visit: Payer: Self-pay | Admitting: Family

## 2020-07-28 DIAGNOSIS — N3281 Overactive bladder: Secondary | ICD-10-CM

## 2020-07-29 ENCOUNTER — Telehealth: Payer: Medicare Other

## 2020-07-30 ENCOUNTER — Telehealth: Payer: Self-pay | Admitting: Family

## 2020-07-30 NOTE — Telephone Encounter (Signed)
Pt needs to discuss medications that she was told to stop taking and start taking.

## 2020-07-30 NOTE — Telephone Encounter (Signed)
Patient states she received a call from her insurance company and they suggested she start [place] and [place].   Advised patient to discuss with PCP at next office visit.

## 2020-07-31 ENCOUNTER — Ambulatory Visit (INDEPENDENT_AMBULATORY_CARE_PROVIDER_SITE_OTHER): Payer: Medicare Other | Admitting: *Deleted

## 2020-07-31 ENCOUNTER — Encounter: Payer: Self-pay | Admitting: *Deleted

## 2020-07-31 DIAGNOSIS — E1169 Type 2 diabetes mellitus with other specified complication: Secondary | ICD-10-CM | POA: Diagnosis not present

## 2020-07-31 DIAGNOSIS — E785 Hyperlipidemia, unspecified: Secondary | ICD-10-CM

## 2020-07-31 DIAGNOSIS — E1142 Type 2 diabetes mellitus with diabetic polyneuropathy: Secondary | ICD-10-CM

## 2020-08-10 ENCOUNTER — Other Ambulatory Visit: Payer: Self-pay | Admitting: Family

## 2020-08-10 DIAGNOSIS — E1142 Type 2 diabetes mellitus with diabetic polyneuropathy: Secondary | ICD-10-CM

## 2020-08-17 ENCOUNTER — Telehealth: Payer: Medicare Other | Admitting: *Deleted

## 2020-08-19 ENCOUNTER — Telehealth: Payer: Self-pay | Admitting: Family

## 2020-08-19 NOTE — Telephone Encounter (Signed)
I called and spoke with patient and she states her Blood sugars have been elevated. When I asked patient what they were running she said the only one she could remember was this morning at 170. Patient given an appointment for 06/28 @ 9:40am. Patient advised that if her blood sugar starts running above 200 to please call us back.

## 2020-08-24 ENCOUNTER — Other Ambulatory Visit: Payer: Self-pay | Admitting: Family Medicine

## 2020-08-25 ENCOUNTER — Ambulatory Visit (INDEPENDENT_AMBULATORY_CARE_PROVIDER_SITE_OTHER): Payer: Medicare Other | Admitting: Family

## 2020-08-25 ENCOUNTER — Encounter: Payer: Self-pay | Admitting: Family

## 2020-08-25 ENCOUNTER — Other Ambulatory Visit: Payer: Self-pay

## 2020-08-25 VITALS — BP 123/74 | HR 66 | Temp 97.5°F | Ht 65.0 in | Wt 195.0 lb

## 2020-08-25 DIAGNOSIS — F419 Anxiety disorder, unspecified: Secondary | ICD-10-CM

## 2020-08-25 DIAGNOSIS — E1142 Type 2 diabetes mellitus with diabetic polyneuropathy: Secondary | ICD-10-CM

## 2020-08-25 LAB — BMP8+EGFR
BUN/Creatinine Ratio: 13 (ref 12–28)
BUN: 14 mg/dL (ref 8–27)
CO2: 26 mmol/L (ref 20–29)
Calcium: 9.9 mg/dL (ref 8.7–10.3)
Chloride: 102 mmol/L (ref 96–106)
Creatinine, Ser: 1.11 mg/dL — ABNORMAL HIGH (ref 0.57–1.00)
Glucose: 160 mg/dL — ABNORMAL HIGH (ref 65–99)
Potassium: 4.7 mmol/L (ref 3.5–5.2)
Sodium: 144 mmol/L (ref 134–144)
eGFR: 51 mL/min/{1.73_m2} — ABNORMAL LOW (ref >59)

## 2020-08-25 MED ORDER — EMPAGLIFLOZIN 10 MG PO TABS
10.0000 mg | ORAL_TABLET | Freq: Every day | ORAL | 1 refills | Status: DC
Start: 2020-08-25 — End: 2020-10-08

## 2020-08-25 MED ORDER — GABAPENTIN 300 MG PO CAPS: ORAL_CAPSULE | ORAL | 2 refills | Status: DC

## 2020-08-25 MED ORDER — ACCU-CHEK GUIDE VI STRP: ORAL_STRIP | 12 refills | Status: DC

## 2020-08-25 NOTE — Progress Notes (Signed)
Subjective:    Patient ID: Denise Gardner, female    DOB: [birth year], 75 y.o.   MRN: [id number]  Chief Complaint  Patient presents with   Diabetes    Fasting BS is staying high ever since she started take repatha shots this am 185   PT presents to the office today to discuss increase glucose. She reports since starting Repatha her fasting blood glucose has been elevated to 170-200. Her A1C was checked on 07/20/20 and it was 6.5.   She reports her feet burn and ache at night a 10 out 10.  Diabetes She presents for her follow-up diabetic visit. She has type 2 diabetes mellitus. Associated symptoms include blurred vision and foot paresthesias. Symptoms are stable. Diabetic complications include heart disease and peripheral neuropathy. Risk factors for coronary artery disease include dyslipidemia, diabetes mellitus, hypertension, sedentary lifestyle and post-menopausal. She is following a generally unhealthy diet. Her overall blood glucose range is 180-200 mg/dl. An ACE inhibitor/angiotensin II receptor blocker is being taken.     Review of Systems  Eyes:  Positive for blurred vision.  All other systems reviewed and are negative.     Objective:   Physical Exam Vitals reviewed.  Constitutional:      General: She is not in acute distress.    Appearance: She is well-developed.  HENT:     Head: Normocephalic and atraumatic.     Right Ear: Tympanic membrane normal.     Left Ear: Tympanic membrane normal.  Eyes:     Pupils: Pupils are equal, round, and reactive to light.  Neck:     Thyroid: No thyromegaly.  Cardiovascular:     Rate and Rhythm: Normal rate and regular rhythm.     Heart sounds: Normal heart sounds. No murmur heard. Pulmonary:     Effort: Pulmonary effort is normal. No respiratory distress.     Breath sounds: Normal breath sounds. No wheezing.  Abdominal:     General: Bowel sounds are normal. There is no distension.     Palpations: Abdomen is soft.     Tenderness:  There is no abdominal tenderness.  Musculoskeletal:        General: No tenderness. Normal range of motion.     Cervical back: Normal range of motion and neck supple.  Skin:    General: Skin is warm and dry.  Neurological:     Mental Status: She is alert and oriented to person, place, and time.     Cranial Nerves: No cranial nerve deficit.     Deep Tendon Reflexes: Reflexes are normal and symmetric.  Psychiatric:        Behavior: Behavior normal.        Thought Content: Thought content normal.        Judgment: Judgment normal.      BP 123/74   Pulse 66   Temp (!) 97.5 F (36.4 C) (Temporal)   Ht _0  (1.651 m)   Wt 195 lb (88.5 kg)   BMI 32.45 kg/m      Assessment & Plan:  Surabhi Gadea comes in today with chief complaint of Diabetes (Fasting BS is staying high ever since she started take repatha shots this am 185)   Diagnosis and orders addressed:  1. Type 2 diabetes mellitus with diabetic polyneuropathy, without long-term current use of insulin (HCC) Will add Jardiance 10 mg Strict low carb diet - glucose blood (ACCU-CHEK GUIDE) test strip; Use as instructed  Dispense: 100 each; Refill: 12 - empagliflozin (JARDIANCE)  10 MG TABS tablet; Take 1 tablet (10 mg total) by mouth daily before breakfast.  Dispense: 90 tablet; Refill: 1 - BMP8+EGFR  2. Diabetic polyneuropathy associated with type 2 diabetes mellitus (HCC) Will increase gabapentin to 900 mg at bedtime from 600 mg - BMP8+EGFR - gabapentin (NEURONTIN) 300 MG capsule; Take 300 mg in AM and 900 mg at bedtime  Dispense: 360 capsule; Refill: 2  3. Anxiety   Labs pending Health Maintenance reviewed Diet and exercise encouraged  Follow up plan: Keep chronic follow up  Evelina Dun, FNP

## 2020-08-25 NOTE — Patient Instructions (Signed)
Leory Plowman and Daroff's neurology in clinical practice (8th ed., pp. 0923- 1929). Elsevier."> Goldman-Cecil medicine (26th ed., pp. 2489- 2501). Elsevier.">  Peripheral Neuropathy Peripheral neuropathy is a type of nerve damage. It affects nerves that carry signals between the spinal cord and the arms, legs, and the rest of the body (peripheral nerves). It does not affect nerves in the spinal cord or brain. In peripheral neuropathy, one nerve or a group of nerves may be damaged. Peripheral neuropathy is a broad category that includes many specific nerve disorders,like diabetic neuropathy, hereditary neuropathy, and carpal tunnel syndrome. What are the causes? This condition may be caused by: Diabetes. This is the most common cause of peripheral neuropathy. Nerve injury. Pressure or stress on a nerve that lasts a long time. Lack (deficiency) of B vitamins. This can result from alcoholism, poor diet, or a restricted diet. Infections. Autoimmune diseases, such as rheumatoid arthritis and systemic lupus erythematosus. Nerve diseases that are passed from parent to child (inherited). Some medicines, such as cancer medicines (chemotherapy). Poisonous (toxic) substances, such as lead and mercury. Too little blood flowing to the legs. Kidney disease. Thyroid disease. In some cases, the cause of this condition is not known. What are the signs or symptoms? Symptoms of this condition depend on which of your nerves is damaged. Common symptoms include: Loss of feeling (numbness) in the feet, hands, or both. Tingling in the feet, hands, or both. Burning pain. Very sensitive skin. Weakness. Not being able to move a part of the body (paralysis). Muscle twitching. Clumsiness or poor coordination. Loss of balance. Not being able to control your bladder. Feeling dizzy. Sexual problems. How is this diagnosed? Diagnosing and finding the cause of peripheral neuropathy can be difficult. Your health care  provider will take your medical history and do a physical exam. A neurological exam will also be done. This involves checking things that are affected by your brain, spinal cord, and nerves (nervous system). For example, your health care provider will check your reflexes, how youmove, and what you can feel. You may have other tests, such as: Blood tests. Electromyogram (EMG) and nerve conduction tests. These tests check nerve function and how well the nerves are controlling the muscles. Imaging tests, such as CT scans or MRI to rule out other causes of your symptoms. Removing a small piece of nerve to be examined in a lab (nerve biopsy). Removing and examining a small amount of the fluid that surrounds the brain and spinal cord (lumbar puncture). How is this treated? Treatment for this condition may involve: Treating the underlying cause of the neuropathy, such as diabetes, kidney disease, or vitamin deficiencies. Stopping medicines that can cause neuropathy, such as chemotherapy. Medicine to help relieve pain. Medicines may include: Prescription or over-the-counter pain medicine. Antiseizure medicine. Antidepressants. Pain-relieving patches that are applied to painful areas of skin. Surgery to relieve pressure on a nerve or to destroy a nerve that is causing pain. Physical therapy to help improve movement and balance. Devices to help you move around (assistive devices). Follow these instructions at home: Medicines Take over-the-counter and prescription medicines only as told by your health care provider. Do not take any other medicines without first asking your health care provider. Do not drive or use heavy machinery while taking prescription pain medicine. Lifestyle  Do not use any products that contain nicotine or tobacco, such as cigarettes and e-cigarettes. Smoking keeps blood from reaching damaged nerves. If you need help quitting, ask your health care provider. Avoid or limit  alcohol. Too much alcohol can cause a vitamin B deficiency, and vitamin B is needed for healthy nerves. Eat a healthy diet. This includes: Eating foods that are high in fiber, such as fresh fruits and vegetables, whole grains, and beans. Limiting foods that are high in fat and processed sugars, such as fried or sweet foods.  General instructions  If you have diabetes, work closely with your health care provider to keep your blood sugar under control. If you have numbness in your feet: Check every day for signs of injury or infection. Watch for redness, warmth, and swelling. Wear padded socks and comfortable shoes. These help protect your feet. Develop a good support system. Living with peripheral neuropathy can be stressful. Consider talking with a mental health specialist or joining a support group. Use assistive devices and attend physical therapy as told by your health care provider. This may include using a walker or a cane. Keep all follow-up visits as told by your health care provider. This is important.  Contact a health care provider if: You have new signs or symptoms of peripheral neuropathy. You are struggling emotionally from dealing with peripheral neuropathy. Your pain is not well-controlled. Get help right away if: You have an injury or infection that is not healing normally. You develop new weakness in an arm or leg. You have fallen or do so frequently. Summary Peripheral neuropathy is when the nerves in the arms, or legs are damaged, resulting in numbness, weakness, or pain. There are many causes of peripheral neuropathy, including diabetes, pinched nerves, vitamin deficiencies, autoimmune disease, and hereditary conditions. Diagnosing and finding the cause of peripheral neuropathy can be difficult. Your health care provider will take your medical history, do a physical exam, and do tests, including blood tests and nerve function tests. Treatment involves treating the  underlying cause of the neuropathy and taking medicines to help control pain. Physical therapy and assistive devices may also help. This information is not intended to replace advice given to you by your health care provider. Make sure you discuss any questions you have with your healthcare provider. Document Revised: 11/26/2019 Document Reviewed: 11/26/2019 Elsevier Patient Education  2022 [employer].

## 2020-08-27 NOTE — Chronic Care Management (AMB) (Signed)
Chronic Care Management   CCM RN Visit Note  08/27/2020 Name: Denise Gardner MRN: [id number] DOB: Jul 29, 1945  Subjective: Denise Gardner is a 75 y.o. year old female who is a primary care patient of Sharion Balloon, FNP. The care management team was consulted for assistance with disease management and care coordination needs.    Engaged with patient by telephone for follow up visit in response to provider referral for case management and/or care coordination services.   Consent to Services:  The patient was given information about Chronic Care Management services, agreed to services, and gave verbal consent prior to initiation of services.  Please see initial visit note for detailed documentation.   Patient agreed to services and verbal consent obtained.   Assessment: Review of patient past medical history, allergies, medications, health status, including review of consultants reports, laboratory and other test data, was performed as part of comprehensive evaluation and provision of chronic care management services.   SDOH (Social Determinants of Health) assessments and interventions performed:    CCM Care Plan  Allergies  Allergen Reactions   Milk-Related Compounds Itching and Nausea And Vomiting   Eggs Or Egg-Derived Products Itching, Nausea And Vomiting and Other (See Comments)    headaches   Lac Bovis Other (See Comments)    Per PCP office   Other Other (See Comments)    Other reaction(s): Other (See Comments) Per [facility] Other reaction(s): Other (See Comments) Per [facility] Per [facility]   Statins    Crestor [Rosuvastatin]     Myalgias    Ezetimibe Other (See Comments)    mylagia Per PCP office    Outpatient Encounter Medications as of 07/31/2020  Medication Sig   Accu-Chek FastClix Lancets MISC CHECK BLOOD SUGAR UP TO FOUR TIMES DAILY Dx E11.69   acetaminophen (TYLENOL) 500 MG tablet Take 500 mg by mouth 2 (two) times daily. Pt takes 2 tablets  at bedtime   amLODipine (NORVASC) 10 MG tablet TAKE 1 TABLET BY MOUTH DAILY   Ascorbic Acid (VITAMIN C) 100 MG tablet Take 100 mg by mouth daily.   aspirin 81 MG chewable tablet Chew 81 mg by mouth daily.   Biotin 5000 MCG TABS Take by mouth.   blood glucose meter kit and supplies Dispense based on patient and insurance preference. Use up to four times daily as directed. E11. Whatever insurance will cover   cholecalciferol (VITAMIN D) 400 UNITS TABS Take by mouth. VITAMIN D3 daily    cyanocobalamin 100 MCG tablet Take 100 mcg by mouth daily.   dextromethorphan-guaiFENesin (MUCINEX DM) 30-600 MG 12hr tablet Take 1 tablet by mouth daily as needed for cough.   diazepam (VALIUM) 5 MG tablet Take 1 tablet (5 mg total) by mouth 2 (two) times daily. Pt takes one tablet at daytime and one tablet at bedtime   escitalopram (LEXAPRO) 10 MG tablet TAKE 1 TABLET BY MOUTH DAILY   Evolocumab (REPATHA SURECLICK) 967 MG/ML SOAJ Inject 140 mg into the skin every 14 (fourteen) days.   FLOVENT HFA 110 MCG/ACT inhaler INHALE 2 PUFFS INTO THE LUNGS TWICE DAILY   fluticasone (FLONASE) 50 MCG/ACT nasal spray SHAKE LIQUID AND USE 2 SPRAYS IN EACH NOSTRIL DAILY   Garlic 10 MG CAPS Take by mouth.   glucose blood (ONETOUCH ULTRA) test strip Test BS BID dx E11.9   hydrOXYzine (VISTARIL) 25 MG capsule TAKE 1 CAPSULE(25 MG) BY MOUTH THREE TIMES DAILY AS NEEDED   icosapent Ethyl (VASCEPA) 1 g capsule  TAKE 4 CAPSULES BY MOUTH EVERY DAY   ipratropium-albuterol (DUONEB) 0.5-2.5 (3) MG/3ML SOLN USE 1 VIAL VIA NEBULIZER EVERY 6 HOURS   isosorbide mononitrate (IMDUR) 120 MG 24 hr tablet Take 2 tablets (240 mg total) by mouth daily. (Patient taking differently: Take 120 mg by mouth daily.)   levothyroxine (SYNTHROID) 100 MCG tablet Take 1 tablet (100 mcg total) by mouth daily. (Patient taking differently: Take 100 mcg by mouth daily before breakfast.)   metoprolol tartrate (LOPRESSOR) 25 MG tablet Take 1 tablet (25 mg total) by mouth  2 (two) times daily.   montelukast (SINGULAIR) 10 MG tablet TAKE 1 TABLET(10 MG) BY MOUTH AT BEDTIME   Multiple Vitamins-Minerals (HAIR SKIN AND NAILS FORMULA PO) Take 1 tablet by mouth daily.   nitroGLYCERIN (NITROSTAT) 0.4 MG SL tablet DISSOLVE 1 TABLET UNDER THE TONGUE EVERY 5 MINUTES AS  NEEDED FOR CHEST PAIN. MAX  OF 3 TABLETS IN 15 MINUTES. CALL 911 IF PAIN PERSISTS.   omeprazole (PRILOSEC) 20 MG capsule TAKE 1 CAPSULE BY MOUTH TWICE DAILY BEFORE A MEAL   raloxifene (EVISTA) 60 MG tablet TAKE 1 TABLET(60 MG) BY MOUTH DAILY   sitaGLIPtin (JANUVIA) 100 MG tablet TAKE 1 TABLET BY MOUTH EVERY DAY   solifenacin (VESICARE) 10 MG tablet TAKE 1 TABLET(10 MG) BY MOUTH DAILY   zinc gluconate 50 MG tablet Take 50 mg by mouth daily.   [DISCONTINUED] gabapentin (NEURONTIN) 300 MG capsule Take 2 capsules (600 mg total) by mouth 2 (two) times daily.   [DISCONTINUED] metFORMIN (GLUCOPHAGE-XR) 500 MG 24 hr tablet TAKE 2 TABLETS(1000 MG) BY MOUTH DAILY WITH BREAKFAST   No facility-administered encounter medications on file as of 07/31/2020.    Patient Active Problem List   Diagnosis Date Noted   Precordial chest pain 03/03/2020   Controlled substance agreement signed 08/16/2018   Myalgia 11/24/2017   Iron deficiency anemia 05/26/2017   Vitamin B 12 deficiency 05/26/2017   Obesity (BMI 30-39.9) 05/25/2017   Constipation 05/19/2016   Hypokalemia 04/30/2015   Depression 07/18/2014   Diabetic neuropathy ([place]) 04/17/2014   OAB (overactive bladder) 04/17/2014   Peripheral neuropathy 04/17/2014   Hypothyroidism 12/27/2012   Osteopenia 05/30/2012   Type 2 diabetes mellitus ([place]) 05/21/2012   Asthma 05/21/2012   Chronic obstructive pulmonary disease ([place]) 05/21/2012   Gastroesophageal reflux disease 05/21/2012   Hyperlipidemia associated with type 2 diabetes mellitus ([place]) 01/26/2011   Hypertension associated with diabetes ([place])    Anxiety    Chronic coronary artery disease     Conditions to be  addressed/monitored:HLD and DMII  Care Plan : RNCM:Diabetes Type 2 (Adult)     Problem: RNCM: Glycemic Management (Diabetes, Type 2)   Priority: Medium     Long-Range Goal: Glycemic Management Optimized   Start Date: 05/12/2020  This Visit's Progress: On track  Recent Progress: On track  Priority: Medium  Note:   Objective: Lab Results  Component Value Date   HGBA1C 6.5 07/20/2020   HGBA1C 6.7 04/21/2020   HGBA1C 6.4 01/20/2020   Lab Results  Component Value Date   MICROALBUR 20 04/17/2014   [place] 189 (H) 03/19/2019   CREATININE 1.11 (H) 08/25/2020  Current Barriers:  Chronic Disease Management support and education needs related to diabetes in a patient with hypertension, hyperlipidemia, and elevated kidney functions Lacks caregiver support.  Unable to independently drive  Nurse Case Manager Clinical Goal(s):  patient will work with PCP to address needs related to medical management of diabetes patient will meet with RN Care  Manager to address self-management of diabetes  Interventions:  1:1 collaboration with Sharion Balloon, FNP regarding development and update of comprehensive plan of care as evidenced by provider attestation and co-signature Inter-disciplinary care team collaboration (see longitudinal plan of care) Evaluation of current treatment plan related to diabetes and patient's adherence to plan as established by provider. Chart reviewed including relevant office notes and lab results Discussed recent A1C and kidney functions Reviewed and discussed medications Reminded to avoid NSAIDs Compliant with medications and using a weekly pill box to organize them Discussed home blood sugar testing Testing twice a day Blood sugar ranges from 115 to 200 No readings below 70 or over 200 Encouraged patient to continue checking blood sugar twice a day and PRN and to write the results down in a log Advised to bring log to PCP appointment for review Reinforced carb  modified diet  Encouraged to continue getting outside daily for a walk Provided with RNCM contact number and encouraged to reach out as needed Reviewed upcoming appointment with PCP and cancer center  Patient Goals/Self-Care Activities Over the next 60 days, patient will: Keep all medical appointments Call PCP with any blood sugar readings outside of recommended range Continue to avoid sugars and simple carbohydrates Take all medication as directed Take blood sugar log and meter to PCP appointment Have clinical staff compare blood sugar reading with your meter to the office meter Get outside for a walk daily Call [facility] as needed [phone]  Follow Up Plan:  Telephone follow up appointment with care management team member scheduled for: 09/01/20 with [facility] The patient has been provided with contact information for the care management team and has been advised to call with any health related questions or concerns.       Plan:Telephone follow up appointment with care management team member scheduled for:  09/01/20 with RNCM   Chong Sicilian, BSN, RN-BC [facility] Direct Dial: [phone]

## 2020-08-27 NOTE — Patient Instructions (Signed)
Visit Information  PATIENT GOALS:  Goals Addressed             This Visit's Progress    Monitor and Manage My Blood Sugar-Diabetes Type 2   On track    Timeframe:  Long-Range Goal Priority:  Medium Start Date:  05/12/20                           Expected End Date:  02/27/21                      Follow Up Date 09/01/20   Keep all medical appointments Call PCP with any blood sugar readings outside of recommended range Continue to avoid sugars and simple carbohydrates Take all medication as directed Take blood sugar log and meter to PCP appointment Have clinical staff compare blood sugar reading with your meter to the office meter Get outside for a walk daily Call [facility] as needed [phone]   Why is this important?   Checking your blood sugar at home helps to keep it from getting very high or very low.  Writing the results in a diary or log helps the doctor know how to care for you.  Your blood sugar log should have the time, date and the results.  Also, write down the amount of insulin or other medicine that you take.  Other information, like what you ate, exercise done and how you were feeling, will also be helpful.     Notes:          The patient verbalized understanding of instructions, educational materials, and care plan provided today and declined offer to receive copy of patient instructions, educational materials, and care plan.   Telephone follow up appointment with care management team member scheduled for: 09/01/20 with RNCM  Chong Sicilian, BSN, RN-BC [place] [facility] [place] [facility] Direct Dial: [phone]

## 2020-09-01 ENCOUNTER — Encounter: Payer: Self-pay | Admitting: *Deleted

## 2020-09-01 ENCOUNTER — Other Ambulatory Visit: Payer: Self-pay | Admitting: Family

## 2020-09-01 ENCOUNTER — Ambulatory Visit (INDEPENDENT_AMBULATORY_CARE_PROVIDER_SITE_OTHER): Payer: Medicare Other | Admitting: *Deleted

## 2020-09-01 DIAGNOSIS — E1159 Type 2 diabetes mellitus with other circulatory complications: Secondary | ICD-10-CM

## 2020-09-01 DIAGNOSIS — M8580 Other specified disorders of bone density and structure, unspecified site: Secondary | ICD-10-CM

## 2020-09-01 DIAGNOSIS — E1142 Type 2 diabetes mellitus with diabetic polyneuropathy: Secondary | ICD-10-CM

## 2020-09-01 DIAGNOSIS — I152 Hypertension secondary to endocrine disorders: Secondary | ICD-10-CM | POA: Diagnosis not present

## 2020-09-01 NOTE — Chronic Care Management (AMB) (Signed)
Chronic Care Management   CCM RN Visit Note  09/01/2020 Name: Denise Gardner MRN: [id number] DOB: 05/07/1945  Subjective: Denise Gardner is a 75 y.o. year old female who is a primary care patient of Sharion Balloon, FNP. The care management team was consulted for assistance with disease management and care coordination needs.    Engaged with patient by telephone for follow up visit in response to provider referral for case management and/or care coordination services.   Consent to Services:  The patient was given information about Chronic Care Management services, agreed to services, and gave verbal consent prior to initiation of services.  Please see initial visit note for detailed documentation.   Patient agreed to services and verbal consent obtained.   Assessment: Review of patient past medical history, allergies, medications, health status, including review of consultants reports, laboratory and other test data, was performed as part of comprehensive evaluation and provision of chronic care management services.   SDOH (Social Determinants of Health) assessments and interventions performed:    CCM Care Plan  Allergies  Allergen Reactions   Milk-Related Compounds Itching and Nausea And Vomiting   Eggs Or Egg-Derived Products Itching, Nausea And Vomiting and Other (See Comments)    headaches   Lac Bovis Other (See Comments)    Per PCP office   Other Other (See Comments)    Other reaction(s): Other (See Comments) Per [facility] Other reaction(s): Other (See Comments) Per [facility] Per [facility]   Statins    Crestor [Rosuvastatin]     Myalgias    Ezetimibe Other (See Comments)    mylagia Per PCP office    Outpatient Encounter Medications as of 09/01/2020  Medication Sig   Accu-Chek FastClix Lancets MISC CHECK BLOOD SUGAR UP TO FOUR TIMES DAILY Dx E11.69   acetaminophen (TYLENOL) 500 MG tablet Take 500 mg by mouth 2 (two) times daily. Pt takes 2 tablets  at bedtime   amLODipine (NORVASC) 10 MG tablet TAKE 1 TABLET BY MOUTH DAILY   Ascorbic Acid (VITAMIN C) 100 MG tablet Take 100 mg by mouth daily.   aspirin 81 MG chewable tablet Chew 81 mg by mouth daily.   Biotin 5000 MCG TABS Take by mouth.   blood glucose meter kit and supplies Dispense based on patient and insurance preference. Use up to four times daily as directed. E11. Whatever insurance will cover   cholecalciferol (VITAMIN D) 400 UNITS TABS Take by mouth. VITAMIN D3 daily    cyanocobalamin 100 MCG tablet Take 100 mcg by mouth daily.   dextromethorphan-guaiFENesin (MUCINEX DM) 30-600 MG 12hr tablet Take 1 tablet by mouth daily as needed for cough.   diazepam (VALIUM) 5 MG tablet Take 1 tablet (5 mg total) by mouth 2 (two) times daily. Pt takes one tablet at daytime and one tablet at bedtime   empagliflozin (JARDIANCE) 10 MG TABS tablet Take 1 tablet (10 mg total) by mouth daily before breakfast.   escitalopram (LEXAPRO) 10 MG tablet TAKE 1 TABLET BY MOUTH DAILY   Evolocumab (REPATHA SURECLICK) 132 MG/ML SOAJ Inject 140 mg into the skin every 14 (fourteen) days.   FLOVENT HFA 110 MCG/ACT inhaler INHALE 2 PUFFS INTO THE LUNGS TWICE DAILY   fluticasone (FLONASE) 50 MCG/ACT nasal spray SHAKE LIQUID AND USE 2 SPRAYS IN EACH NOSTRIL DAILY   gabapentin (NEURONTIN) 300 MG capsule Take 300 mg in AM and 900 mg at bedtime   Garlic 10 MG CAPS Take by mouth.   glucose  blood (ACCU-CHEK GUIDE) test strip Use as instructed   glucose blood (ONETOUCH ULTRA) test strip Test BS BID dx E11.9   hydrOXYzine (VISTARIL) 25 MG capsule TAKE 1 CAPSULE(25 MG) BY MOUTH THREE TIMES DAILY AS NEEDED   icosapent Ethyl (VASCEPA) 1 g capsule TAKE 4 CAPSULES BY MOUTH EVERY DAY   ipratropium-albuterol (DUONEB) 0.5-2.5 (3) MG/3ML SOLN USE 1 VIAL VIA NEBULIZER EVERY 6 HOURS   isosorbide mononitrate (IMDUR) 120 MG 24 hr tablet Take 2 tablets (240 mg total) by mouth daily. (Patient taking differently: Take 120 mg by mouth  daily.)   levothyroxine (SYNTHROID) 100 MCG tablet Take 1 tablet (100 mcg total) by mouth daily. (Patient taking differently: Take 100 mcg by mouth daily before breakfast.)   metFORMIN (GLUCOPHAGE-XR) 500 MG 24 hr tablet TAKE 2 TABLETS(1000 MG) BY MOUTH DAILY WITH BREAKFAST   metoprolol tartrate (LOPRESSOR) 25 MG tablet Take 1 tablet (25 mg total) by mouth 2 (two) times daily.   misoprostol (CYTOTEC) 200 MCG tablet Take 200 mcg by mouth 4 (four) times daily.   montelukast (SINGULAIR) 10 MG tablet TAKE 1 TABLET(10 MG) BY MOUTH AT BEDTIME   Multiple Vitamins-Minerals (HAIR SKIN AND NAILS FORMULA PO) Take 1 tablet by mouth daily.   nitroGLYCERIN (NITROSTAT) 0.4 MG SL tablet DISSOLVE 1 TABLET UNDER THE TONGUE EVERY 5 MINUTES AS  NEEDED FOR CHEST PAIN. MAX  OF 3 TABLETS IN 15 MINUTES. CALL 911 IF PAIN PERSISTS.   omeprazole (PRILOSEC) 20 MG capsule TAKE 1 CAPSULE BY MOUTH TWICE DAILY BEFORE A MEAL   raloxifene (EVISTA) 60 MG tablet TAKE 1 TABLET(60 MG) BY MOUTH DAILY   sitaGLIPtin (JANUVIA) 100 MG tablet TAKE 1 TABLET BY MOUTH EVERY DAY   solifenacin (VESICARE) 10 MG tablet TAKE 1 TABLET(10 MG) BY MOUTH DAILY   zinc gluconate 50 MG tablet Take 50 mg by mouth daily.   No facility-administered encounter medications on file as of 09/01/2020.    Patient Active Problem List   Diagnosis Date Noted   Precordial chest pain 03/03/2020   Controlled substance agreement signed 08/16/2018   Myalgia 11/24/2017   Iron deficiency anemia 05/26/2017   Vitamin B 12 deficiency 05/26/2017   Obesity (BMI 30-39.9) 05/25/2017   Constipation 05/19/2016   Hypokalemia 04/30/2015   Depression 07/18/2014   Diabetic neuropathy ([place]) 04/17/2014   OAB (overactive bladder) 04/17/2014   Peripheral neuropathy 04/17/2014   Hypothyroidism 12/27/2012   Osteopenia 05/30/2012   Type 2 diabetes mellitus ([place]) 05/21/2012   Asthma 05/21/2012   Chronic obstructive pulmonary disease ([place]) 05/21/2012   Gastroesophageal reflux  disease 05/21/2012   Hyperlipidemia associated with type 2 diabetes mellitus ([place]) 01/26/2011   Hypertension associated with diabetes ([place])    Anxiety    Chronic coronary artery disease     Conditions to be addressed/monitored:HTN and DMII  Care Plan : RNCM:Diabetes Type 2 (Adult)  Updates made by Ilean China, RN since 09/01/2020 12:00 AM     Problem: RNCM: Glycemic Management (Diabetes, Type 2)   Priority: High     Long-Range Goal: Glycemic Management Optimized   Start Date: 05/12/2020  This Visit's Progress: On track  Recent Progress: On track  Priority: High  Note:   Objective: Lab Results  Component Value Date   HGBA1C 6.5 07/20/2020   HGBA1C 6.7 04/21/2020   HGBA1C 6.4 01/20/2020   Lab Results  Component Value Date   MICROALBUR 20 04/17/2014   LDLCALC 189 (H) 03/19/2019   CREATININE 1.11 (H) 08/25/2020  Current Barriers:  Chronic  Disease Management support and education needs related to diabetes in a patient with hypertension, hyperlipidemia, and elevated kidney functions Lacks caregiver support.  Unable to independently drive  Nurse Case Manager Clinical Goal(s):  patient will work with PCP to address needs related to medical management of diabetes patient will meet with RN Care Manager to address self-management of diabetes the patient will demonstrate ongoing self health care management ability  Interventions:  1:1 collaboration with Sharion Balloon, FNP regarding development and update of comprehensive plan of care as evidenced by provider attestation and co-signature Inter-disciplinary care team collaboration (see longitudinal plan of care) Evaluation of current treatment plan related to diabetes and patient's adherence to plan as established by provider. Reviewed and discussed medications Discussed addition of Jardiance at last office visit Tolerating well and taking as directed No problems with cost because she has Medicare Extra Help  Blood sugars  have improved some since starting Jardiance but are still not as good as they were before starting Repatha Chart reviewed including relevant office notes and lab results Discussed recent office visit with PCP regarding hyperglycemia since starting Repatha Discussed home blood sugar testing Testing twice a day and as needed A few readings over 300  No readings below 70 Encouraged patient to continue checking blood sugar twice a day and PRN and to write the results down in a log Advised to call PCP with any readings over 300 or with several readings in a row over 200 Advised to bring log to PCP appointment for review Reinforced carb modified diet and verbal education provided on what that is Encouraged to continue getting outside daily for a walk but to avoid the heat of the day Provided with RNCM contact number and encouraged to reach out as needed Reviewed upcoming appointment with PCP and cancer center  Patient Goals/Self-Care Activities Over the next 60 days, patient will: Keep all medical appointments Call PCP with any blood sugar readings over 300 or with several readings in a row over 200 Continue to avoid sugars and simple carbohydrates Take all medication as directed Take blood sugar log and meter to PCP appointment Get outside for a walk daily but avoid the heat of the day Call [facility] as needed [phone]  Follow Up Plan:  Telephone follow up appointment with care management team member scheduled for:  09/23/20 with [facility] The patient has been provided with contact information for the care management team and has been advised to call with any health related questions or concerns.       Plan:Telephone follow up appointment with care management team member scheduled for:  09/23/20 with RNCM  Chong Sicilian, BSN, RN-BC [place] / [place] [facility] Direct Dial: [phone]

## 2020-09-01 NOTE — Patient Instructions (Signed)
Visit Information  PATIENT GOALS:  Goals Addressed             This Visit's Progress    Monitor and Manage My Blood Sugar-Diabetes Type 2   On track    Timeframe:  Long-Range Goal Priority:  Medium Start Date:  05/12/20                           Expected End Date:  02/27/21                      Follow Up Date 09/23/20   Keep all medical appointments Call PCP with any blood sugar readings over 300 or with several readings in a row over 200 Continue to avoid sugars and simple carbohydrates Take all medication as directed Take blood sugar log and meter to PCP appointment Get outside for a walk daily but avoid the heat of the day Call [facility] as needed [phone]   Why is this important?   Checking your blood sugar at home helps to keep it from getting very high or very low.  Writing the results in a diary or log helps the doctor know how to care for you.  Your blood sugar log should have the time, date and the results.  Also, write down the amount of insulin or other medicine that you take.  Other information, like what you ate, exercise done and how you were feeling, will also be helpful.     Notes:          The patient verbalized understanding of instructions, educational materials, and care plan provided today and declined offer to receive copy of patient instructions, educational materials, and care plan.   Telephone follow up appointment with care management team member scheduled for: 09/23/20 with RNCM  Chong Sicilian, BSN, RN-BC [place] / [place] Management Direct Dial: [phone]

## 2020-09-08 ENCOUNTER — Other Ambulatory Visit: Payer: Self-pay | Admitting: Family

## 2020-09-08 DIAGNOSIS — E1149 Type 2 diabetes mellitus with other diabetic neurological complication: Secondary | ICD-10-CM

## 2020-09-08 DIAGNOSIS — E1142 Type 2 diabetes mellitus with diabetic polyneuropathy: Secondary | ICD-10-CM

## 2020-09-11 ENCOUNTER — Telehealth: Payer: Medicare Other

## 2020-09-14 ENCOUNTER — Other Ambulatory Visit: Payer: Self-pay | Admitting: *Deleted

## 2020-09-14 DIAGNOSIS — E1142 Type 2 diabetes mellitus with diabetic polyneuropathy: Secondary | ICD-10-CM

## 2020-09-14 MED ORDER — ACCU-CHEK GUIDE VI STRP: ORAL_STRIP | 12 refills | Status: DC

## 2020-09-23 ENCOUNTER — Telehealth: Payer: Medicare Other

## 2020-10-03 ENCOUNTER — Other Ambulatory Visit: Payer: Self-pay | Admitting: Family

## 2020-10-03 DIAGNOSIS — E1142 Type 2 diabetes mellitus with diabetic polyneuropathy: Secondary | ICD-10-CM

## 2020-10-03 DIAGNOSIS — E1149 Type 2 diabetes mellitus with other diabetic neurological complication: Secondary | ICD-10-CM

## 2020-10-08 ENCOUNTER — Ambulatory Visit (INDEPENDENT_AMBULATORY_CARE_PROVIDER_SITE_OTHER): Payer: Medicare Other | Admitting: Pharmacist

## 2020-10-08 DIAGNOSIS — E1142 Type 2 diabetes mellitus with diabetic polyneuropathy: Secondary | ICD-10-CM | POA: Diagnosis not present

## 2020-10-08 MED ORDER — EMPAGLIFLOZIN 25 MG PO TABS: 25.0000 mg | ORAL_TABLET | Freq: Every day | ORAL | 1 refills | Status: DC

## 2020-10-08 NOTE — Progress Notes (Signed)
Chronic Care Management Pharmacy Note  10/22/2020 Name:  Denise Gardner MRN:  [id number] DOB:  Jun 09, 1945  Summary: T2DM  Recommendations/Changes made from today's visit: Diabetes: Uncontrolled--patient reports increased blood sugars after start of repatha; current treatment: jardiance 25, januvia 100, metformin 1 gBID;  A1c 6.5, recent hyperglycemia GFR 51 Current glucose readings: fasting glucose: 118-->180-190, post prandial glucose: n/a Denies hypoglycemic/hyperglycemic symptoms Discussed meal planning options and Plate method for healthy eating Avoid sugary drinks and desserts Incorporate balanced protein, non starchy veggies, 1 serving of carbohydrate with each meal Increase water intake Increase physical activity as able  Current exercise: n/a Educated on diet/medications Recommended continue medications as prescribed; will f/u in 4 weeks to review blood sugars, consider GLP1--d/c januvia if patient still elevated  Follow Up Plan: Telephone follow up appointment with care management team member scheduled for: 1 month  Subjective: Denise Gardner is an 75 y.o. year old female who is a primary patient of Sharion Balloon, FNP.  The CCM team was consulted for assistance with disease management and care coordination needs.    Engaged with patient by telephone for follow up visit in response to provider referral for pharmacy case management and/or care coordination services.   Consent to Services:  The patient was given information about Chronic Care Management services, agreed to services, and gave verbal consent prior to initiation of services.  Please see initial visit note for detailed documentation.   Patient Care Team: Sharion Balloon, FNP as PCP - General (Nurse Practitioner) Minus Breeding, MD as PCP - Cardiology (Cardiology) Minus Breeding, MD as Attending Physician (Cardiology) Melina Schools, OD (Optometry) Ilean China, RN as Case Manager Lavera Guise, [facility]  as Pharmacist (Family Medicine)  Objective:  Lab Results  Component Value Date   CREATININE 1.11 (H) 08/25/2020   CREATININE 1.03 (H) 07/20/2020   CREATININE 1.25 (H) 06/03/2020    Lab Results  Component Value Date   HGBA1C 6.5 07/20/2020   Last diabetic Eye exam:  Lab Results  Component Value Date/Time   HMDIABEYEEXA Retinopathy (A) 03/09/2020 02:07 PM    Last diabetic Foot exam: No results found for: HMDIABFOOTEX      Component Value Date/Time   CHOL 267 (H) 03/19/2019 1210   CHOL 136 09/05/2012 1256   TRIG 112 03/19/2019 1210   TRIG 43 12/28/2012 0938   TRIG 38 09/05/2012 1256   HDL 58 03/19/2019 1210   HDL 66 12/28/2012 0938   HDL 59 09/05/2012 1256   CHOLHDL 4.6 (H) 03/19/2019 1210   LDLCALC 189 (H) 03/19/2019 1210   LDLCALC 80 12/28/2012 0938   LDLCALC 69 09/05/2012 1256    Hepatic Function Latest Ref Rng & Units 07/20/2020 06/03/2020 04/21/2020  Total Protein 6.0 - 8.5 g/dL 6.5 6.9 6.8  Albumin 3.7 - 4.7 g/dL 4.0 3.7 4.1  AST 0 - 40 IU/L '14 16 15  ' ALT 0 - 32 IU/L '10 13 12  ' Alk Phosphatase 44 - 121 IU/L 38(L) 29(L) 43(L)  Total Bilirubin 0.0 - 1.2 mg/dL 0.4 0.9 0.4    Lab Results  Component Value Date/Time   TSH 0.941 07/20/2020 11:49 AM   TSH 7.800 (H) 04/21/2020 11:59 AM    CBC Latest Ref Rng & Units 06/03/2020 04/21/2020 01/20/2020  WBC 4.0 - 10.5 K/uL 9.0 11.8(H) 9.1  Hemoglobin 12.0 - 15.0 g/dL 13.4 14.9 14.0  Hematocrit 36.0 - 46.0 % 40.4 45.1 42.6  Platelets 150 - 400 K/uL 198 222 218    Lab  Results  Component Value Date/Time   VD25OH 41.83 06/03/2020 11:37 AM   VD25OH 42.97 11/20/2019 12:05 PM    Clinical ASCVD: No  The 10-year ASCVD risk score Mikey Bussing DC Jr., et al., 2013) is: 49.6%   Values used to calculate the score:     Age: 75 years     Sex: Female     Is Non-Hispanic African American: No     Diabetic: Yes     Tobacco smoker: No     Systolic Blood Pressure: 650 mmHg     Is BP treated: Yes     HDL Cholesterol: 58 mg/dL     Total  Cholesterol: 267 mg/dL    Other: (CHADS2VASc if Afib, PHQ9 if depression, MMRC or CAT for COPD, ACT, DEXA)  Social History   Tobacco Use  Smoking Status Never  Smokeless Tobacco Never   BP Readings from Last 3 Encounters:  10/22/20 139/76  10/12/20 134/80  08/25/20 123/74   Pulse Readings from Last 3 Encounters:  10/22/20 79  10/12/20 68  08/25/20 66   Wt Readings from Last 3 Encounters:  10/22/20 192 lb (87.1 kg)  10/12/20 193 lb 6.4 oz (87.7 kg)  08/25/20 195 lb (88.5 kg)    Assessment: Review of patient past medical history, allergies, medications, health status, including review of consultants reports, laboratory and other test data, was performed as part of comprehensive evaluation and provision of chronic care management services.   SDOH:  (Social Determinants of Health) assessments and interventions performed:    CCM Care Plan  Allergies  Allergen Reactions   Milk-Related Compounds Itching and Nausea And Vomiting   Eggs Or Egg-Derived Products Itching, Nausea And Vomiting and Other (See Comments)    headaches   Lac Bovis Other (See Comments)    Per PCP office   Other Other (See Comments)    Other reaction(s): Other (See Comments) Per [facility] Other reaction(s): Other (See Comments) Per [facility] Per [facility]   Statins    Crestor [Rosuvastatin]     Myalgias    Ezetimibe Other (See Comments)    mylagia Per PCP office    Medications Reviewed Today     Reviewed by Gwenlyn Perking, FNP (Family Nurse Practitioner) on 10/22/20 at 28  Med List Status: <None>   Medication Order Taking? Sig Documenting Provider Last Dose Status Informant  Accu-Chek FastClix Lancets MISC [id number] Yes CHECK BLOOD SUGAR UP TO FOUR TIMES DAILY Dx E11.69 Sharion Balloon, FNP Taking Active   acetaminophen (TYLENOL) 500 MG tablet [id number] Yes Take 500 mg by mouth 2 (two) times daily. Pt takes 2 tablets at bedtime [provider] Taking  Active Self  amLODipine (NORVASC) 10 MG tablet [id number] Yes TAKE 1 TABLET BY MOUTH DAILY Hawks, Christy A, FNP Taking Active   Ascorbic Acid (VITAMIN C) 100 MG tablet [id number] Yes Take 100 mg by mouth daily. [provider] Taking Active   aspirin 81 MG chewable tablet [id number] Yes Chew 81 mg by mouth daily. [provider] Taking Active   Biotin 5000 MCG TABS [id number] Yes Take by mouth. [provider] Taking Active Self  blood glucose meter kit and supplies [id number] Yes Dispense based on patient and insurance preference. Use up to four times daily as directed. E11. Whatever insurance will cover Sharion Balloon, FNP Taking Active   cholecalciferol (VITAMIN D) 400 UNITS TABS [id number] Yes Take by mouth. VITAMIN D3 daily  [provider] Taking Active  cyanocobalamin 100 MCG tablet [id number] Yes Take 100 mcg by mouth daily. [provider] Taking Active   diazepam (VALIUM) 5 MG tablet [id number] Yes Take 1 tablet (5 mg total) by mouth 2 (two) times daily. Pt takes one tablet at daytime and one tablet at bedtime Evelina Dun A, FNP Taking Active   doxycycline (VIBRA-TABS) 100 MG tablet [id number] Yes Take 1 tablet (100 mg total) by mouth 2 (two) times daily for 10 days. 1 po bid Gwenlyn Perking, FNP  Active   empagliflozin (JARDIANCE) 25 MG TABS tablet [id number] Yes Take 1 tablet (25 mg total) by mouth daily before breakfast. Sharion Balloon, FNP Taking Active   escitalopram (LEXAPRO) 10 MG tablet [id number] Yes TAKE 1 TABLET BY MOUTH DAILY Sharion Balloon, FNP Taking Active   FLOVENT HFA 110 MCG/ACT inhaler [id number] Yes INHALE 2 PUFFS INTO THE LUNGS TWICE DAILY Evelina Dun A, FNP Taking Active   fluticasone (FLONASE) 50 MCG/ACT nasal spray [id number] Yes SHAKE LIQUID AND USE 2 SPRAYS IN EACH NOSTRIL DAILY Hawks, Christy A, FNP Taking Active   gabapentin (NEURONTIN) 300 MG capsule [id number] Yes Take 300 mg in AM and 900 mg at bedtime Evelina Dun  A, FNP Taking Active   Garlic 10 MG CAPS [id number] Yes Take by mouth. [provider] Taking Active   glucose blood (ACCU-CHEK GUIDE) test strip [id number] Yes CHECK BLOOD SUGAR UP TO FOUR TIMES DAILY Dx E11.69 Evelina Dun A, FNP Taking Active   hydrOXYzine (VISTARIL) 25 MG capsule [id number] Yes TAKE 1 CAPSULE(25 MG) BY MOUTH THREE TIMES DAILY AS NEEDED Sharion Balloon, FNP Taking Active   icosapent Ethyl (VASCEPA) 1 g capsule [id number] Yes TAKE 4 CAPSULES BY MOUTH EVERY DAY Hawks, Christy A, FNP Taking Active   ipratropium-albuterol (DUONEB) 0.5-2.5 (3) MG/3ML SOLN [id number] Yes USE 1 VIAL VIA NEBULIZER EVERY 6 HOURS Hawks, Christy A, FNP Taking Active   isosorbide mononitrate (IMDUR) 120 MG 24 hr tablet [id number] Yes Take 2 tablets (240 mg total) by mouth daily.  Patient taking differently: Take 120 mg by mouth daily.   Minus Breeding, MD Taking Active   levothyroxine (SYNTHROID) 100 MCG tablet [id number] Yes Take 1 tablet (100 mcg total) by mouth daily.  Patient taking differently: Take 100 mcg by mouth daily before breakfast.   Sharion Balloon, FNP Taking Active            Med Note Ilean China   Tue May 26, 2020 11:40 AM)    linaclotide Rolan Lipa) 72 MCG capsule [id number] Yes Take 1 capsule (72 mcg total) by mouth daily before breakfast. Sharion Balloon, FNP Taking Active   meloxicam (MOBIC) 7.5 MG tablet [id number] Yes Take 1 tablet (7.5 mg total) by mouth daily. Sharion Balloon, FNP Taking Active   metFORMIN (GLUCOPHAGE-XR) 500 MG 24 hr tablet [id number] Yes TAKE 2 TABLETS(1000 MG) BY MOUTH DAILY WITH BREAKFAST Hawks, Christy A, FNP Taking Active   metoprolol tartrate (LOPRESSOR) 25 MG tablet [id number] Yes Take 1 tablet (25 mg total) by mouth 2 (two) times daily. Sharion Balloon, FNP Taking Active   misoprostol (CYTOTEC) 200 MCG tablet [id number] Yes Take 200 mcg by mouth 4 (four) times daily. [provider] Taking Active   montelukast (SINGULAIR) 10 MG tablet  [id number] Yes TAKE 1 TABLET(10 MG) BY MOUTH AT BEDTIME Evelina Dun A, FNP Taking Active   Multiple Vitamins-Minerals (HAIR SKIN AND NAILS FORMULA PO) [id number] Yes Take 1 tablet by mouth daily. [provider] Taking  Active   nitroGLYCERIN (NITROSTAT) 0.4 MG SL tablet [id number] Yes DISSOLVE 1 TABLET UNDER THE TONGUE EVERY 5 MINUTES AS  NEEDED FOR CHEST PAIN. MAX  OF 3 TABLETS IN 15 MINUTES. CALL 911 IF PAIN PERSISTS. Sharion Balloon, FNP Taking Active   omeprazole (PRILOSEC) 20 MG capsule [id number] Yes TAKE 1 CAPSULE BY MOUTH TWICE DAILY BEFORE A MEAL Hawks, Christy A, FNP Taking Active   raloxifene (EVISTA) 60 MG tablet [id number] Yes TAKE 1 TABLET(60 MG) BY MOUTH DAILY Sharion Balloon, FNP Taking Active   REPATHA SURECLICK 038 MG/ML SOAJ [id number] Yes ADMINISTER 1 ML UNDER THE SKIN EVERY 14 DAYS Sharion Balloon, FNP Taking Active   sitaGLIPtin (JANUVIA) 100 MG tablet [id number] Yes TAKE 1 TABLET BY MOUTH EVERY DAY Evelina Dun A, FNP Taking Active   solifenacin (VESICARE) 10 MG tablet [id number] Yes TAKE 1 TABLET(10 MG) BY MOUTH DAILY Evelina Dun A, FNP Taking Active   zinc gluconate 50 MG tablet [id number] Yes Take 50 mg by mouth daily. [provider] Taking Active             Patient Active Problem List   Diagnosis Date Noted   Precordial chest pain 03/03/2020   Controlled substance agreement signed 08/16/2018   Myalgia 11/24/2017   Iron deficiency anemia 05/26/2017   Vitamin B 12 deficiency 05/26/2017   Obesity (BMI 30-39.9) 05/25/2017   Constipation 05/19/2016   Hypokalemia 04/30/2015   Depression 07/18/2014   Diabetic neuropathy ([place]) 04/17/2014   OAB (overactive bladder) 04/17/2014   Peripheral neuropathy 04/17/2014   Hypothyroidism 12/27/2012   Osteopenia 05/30/2012   Type 2 diabetes mellitus ([place]) 05/21/2012   Asthma 05/21/2012   Chronic obstructive pulmonary disease ([place]) 05/21/2012   Gastroesophageal reflux disease 05/21/2012   Hyperlipidemia  associated with type 2 diabetes mellitus ([place]) 01/26/2011   Hypertension associated with diabetes ([place])    Anxiety    Chronic coronary artery disease     Immunization History  Administered Date(s) Administered   Influenza Inj Mdck Quad Pf 12/18/2018, 01/20/2020   Influenza, Quadrivalent, Recombinant, Inj, Pf 03/30/2016, 12/28/2016   Influenza,inj,Quad PF,6+ Mos 01/02/2018   Influenza-Unspecified 12/18/2018   Janssen (J&J) SARS-COV-2 Vaccination 06/10/2019   Pneumococcal Conjugate-13 12/20/2013   Pneumococcal Polysaccharide-23 10/22/2010   Tdap 09/05/2012   Zoster Recombinat (Shingrix) 11/29/2018, 01/30/2019    Conditions to be addressed/monitored: DMII  Care Plan : PHARMD MEDICATION MANAGEMENT  Updates made by Lavera Guise, [place] since 10/22/2020 12:00 AM     Problem: DISEASE PROGRESSION PREVENTION      Long-Range Goal: T2DM   This Visit's Progress: Not on track  Priority: High  Note:   Current Barriers:  Unable to maintain control of t2dm  Pharmacist Clinical Goal(s):  Over the next 90 days, patient will maintain control of t2dm as evidenced by improved glycemic control  through collaboration with PharmD and provider.    Interventions: 1:1 collaboration with Sharion Balloon, FNP regarding development and update of comprehensive plan of care as evidenced by provider attestation and co-signature Inter-disciplinary care team collaboration (see longitudinal plan of care) Comprehensive medication review performed; medication list updated in electronic medical record  Diabetes: Uncontrolled--patient reports increased blood sugars after start of repatha; current treatment: jardiance 25, januvia 100, metformin 1 gBID;  A1c 6.5, recent hyperglycemia GFR 51 Current glucose readings: fasting glucose: 118-->180-190, post prandial glucose: n/a Denies hypoglycemic/hyperglycemic symptoms Discussed meal planning options and Plate method for healthy eating Avoid sugary drinks and  desserts Incorporate balanced protein, non starchy  veggies, 1 serving of carbohydrate with each meal Increase water intake Increase physical activity as able  Current exercise: n/a Educated on diet/medications Recommended continue medications as prescribed ; will f/u in 4 weeks to review blood sugars, consider GLP1--d/c januvia if patient still elevated   Patient Goals/Self-Care Activities Over the next 90 days, patient will:  - take medications as prescribed check glucose daily (fasting), document, and provide at future appointments  Follow Up Plan: Telephone follow up appointment with care management team member scheduled for: 1 month      Medication Assistance: None required.  Patient affirms current coverage meets needs.  Patient's preferred pharmacy is:  [facility] [address] [place], [place] - [address] AT [facility] [address] [address] [place] [address] Phone: [phone] Fax: [phone]  [facility]  ([place], [place] [facility] [address] [place] [address] [address] Phone: [phone] Fax: [place] [address], [place] - [place] [address] [place] [address] Phone: [phone] Fax: [phone]  Follow Up:  Patient agrees to Care Plan and Follow-up.  Plan: Telephone follow up appointment with care management team member scheduled for:  1 MONTH   Regina Eck, PharmD, BCPS Clinical Pharmacist, [place]  [place] Phone [phone]

## 2020-10-09 ENCOUNTER — Telehealth: Payer: Medicare Other

## 2020-10-12 ENCOUNTER — Other Ambulatory Visit: Payer: Self-pay

## 2020-10-12 ENCOUNTER — Ambulatory Visit (INDEPENDENT_AMBULATORY_CARE_PROVIDER_SITE_OTHER): Payer: Medicare Other | Admitting: Family

## 2020-10-12 ENCOUNTER — Encounter: Payer: Self-pay | Admitting: Family

## 2020-10-12 VITALS — BP 134/80 | HR 68 | Temp 98.0°F | Ht 65.0 in | Wt 193.4 lb

## 2020-10-12 DIAGNOSIS — E1159 Type 2 diabetes mellitus with other circulatory complications: Secondary | ICD-10-CM | POA: Diagnosis not present

## 2020-10-12 DIAGNOSIS — E1142 Type 2 diabetes mellitus with diabetic polyneuropathy: Secondary | ICD-10-CM

## 2020-10-12 DIAGNOSIS — H61002 Unspecified perichondritis of left external ear: Secondary | ICD-10-CM | POA: Diagnosis not present

## 2020-10-12 DIAGNOSIS — I152 Hypertension secondary to endocrine disorders: Secondary | ICD-10-CM | POA: Diagnosis not present

## 2020-10-12 DIAGNOSIS — J449 Chronic obstructive pulmonary disease, unspecified: Secondary | ICD-10-CM

## 2020-10-12 DIAGNOSIS — M159 Polyosteoarthritis, unspecified: Secondary | ICD-10-CM

## 2020-10-12 DIAGNOSIS — E1169 Type 2 diabetes mellitus with other specified complication: Secondary | ICD-10-CM | POA: Diagnosis not present

## 2020-10-12 DIAGNOSIS — F331 Major depressive disorder, recurrent, moderate: Secondary | ICD-10-CM

## 2020-10-12 DIAGNOSIS — K219 Gastro-esophageal reflux disease without esophagitis: Secondary | ICD-10-CM

## 2020-10-12 DIAGNOSIS — N3281 Overactive bladder: Secondary | ICD-10-CM

## 2020-10-12 DIAGNOSIS — F1320 Sedative, hypnotic or anxiolytic dependence, uncomplicated: Secondary | ICD-10-CM

## 2020-10-12 DIAGNOSIS — D485 Neoplasm of uncertain behavior of skin: Secondary | ICD-10-CM

## 2020-10-12 DIAGNOSIS — Z79899 Other long term (current) drug therapy: Secondary | ICD-10-CM | POA: Diagnosis not present

## 2020-10-12 DIAGNOSIS — F419 Anxiety disorder, unspecified: Secondary | ICD-10-CM

## 2020-10-12 DIAGNOSIS — K5900 Constipation, unspecified: Secondary | ICD-10-CM

## 2020-10-12 DIAGNOSIS — E669 Obesity, unspecified: Secondary | ICD-10-CM

## 2020-10-12 DIAGNOSIS — E785 Hyperlipidemia, unspecified: Secondary | ICD-10-CM

## 2020-10-12 MED ORDER — MELOXICAM 7.5 MG PO TABS: 7.5000 mg | ORAL_TABLET | Freq: Every day | ORAL | 1 refills | Status: DC

## 2020-10-12 MED ORDER — DIAZEPAM 5 MG PO TABS: 5.0000 mg | ORAL_TABLET | Freq: Two times a day (BID) | ORAL | 2 refills | Status: DC

## 2020-10-12 MED ORDER — LINACLOTIDE 72 MCG PO CAPS: 72.0000 ug | ORAL_CAPSULE | Freq: Every day | ORAL | 1 refills | Status: DC

## 2020-10-12 NOTE — Patient Instructions (Signed)
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 The growth often forms on the ear that you most often have against thepillow when sleeping or that you regularly use for the telephone. What are the causes? The exact cause of this condition is not known. It may result from repeated trauma or pressure that causes a decrease in blood flow to the cartilage andskin. What increases the risk? You are more likely to develop this condition if: You are a man who is 77 years old or older. You have light-colored (fair) skin. You do things that put repeated pressure on your ear, such as: Regularly sleeping on one side of your body. Frequently using devices that press on the ear, such as a telephone. You have had an injury to the outer part of the ear, such as an injury caused by: A cut or a direct hit. Sun damage. Exposure to cold. Radiation. What are the signs or symptoms? Symptoms of this condition include: Pain on the outer part of the ear that gets worse with pressure. This is the main symptom. A firm bump (nodule) on the outer part of the ear that breaks open (ulcerates) and gets red and crusty. How is this diagnosed? This condition may be diagnosed based on: Your symptoms. Your medical history. A physical exam. The removal of a small piece of growth that will be looked at under a microscope (biopsy). This may be done to rule out skin cancer and to confirm the diagnosis. How is this treated? The condition may clear up on its own with time. In most cases, treatment is needed. This may include: Using a type of sponge or cushion for your pillow to keep pressure off the outer part of the ear while you sleep. Applying a topical cream or ointment to: Reduce inflammation. Increase blood flow. Getting injections, which may include: Steroids to reduce inflammation. Cartilage to cushion the  nodule. Having surgery to remove the nodule. This may be done if other treatments do not help. Surgery may involve: Freezing the area (cryosurgery). Laser surgery. Scraping out the core of the nodule (curettage). Removing the nodule and some cartilage that is below it (wedge resection). Follow these instructions at home: Take over-the-counter and prescription medicines only as told by your health care provider. If you were given an ear sponge or cushion, wear it as told by your health care provider. Do not sleep on the affected ear. Use cream or ointment as told by your health care provider. Keep all follow-up visits as told by your health care provider. This is important. Contact a health care provider if: Your condition is not improving. The outer part of your ear is getting more painful, red, or swollen. You have a fever. Get help right away if: You develop increasing redness involving the ear and spreading to the surrounding skin. Summary Chondrodermatitis nodularis helicis (CNH) is a condition that causes a painful growth to form on the top of the outer ear (helix). The growth often forms on the ear that you most often have against the pillow when sleeping or that you regularly use for the telephone. Treatment may include applying a cream or ointment to the area. In some cases, surgery may be needed if other treatments do not help. Do not sleep on the affected ear. If you were given an ear sponge or cushion, wear it as told by your health care provider. This information is  not intended to replace advice given to you by your health care provider. Make sure you discuss any questions you have with your healthcare provider. Document Revised: 05/08/2019 Document Reviewed: 05/08/2019 Elsevier Patient Education  [place].

## 2020-10-12 NOTE — Progress Notes (Signed)
Subjective:    Patient ID: Denise Gardner, female    DOB: 01-30-1946, 75 y.o.   MRN: [id number]  Chief Complaint  Patient presents with   Medical Management of Chronic Issues    Left wrist and hip pain, Also spot on left ear    Pt presents to the office today for chronic follow up. She is followed by  Cardiologists annually for CAD. She is followed by Hematologists  for iron deficiency anemia and Vit B 12 deficiency. Hypertension This is a chronic problem. The current episode started more than 1 year ago. The problem has been resolved since onset. The problem is controlled. Associated symptoms include anxiety, malaise/fatigue and peripheral edema. Pertinent negatives include no blurred vision or shortness of breath. Risk factors for coronary artery disease include dyslipidemia, diabetes mellitus, obesity and sedentary lifestyle. The current treatment provides moderate improvement.  Gastroesophageal Reflux She complains of belching and heartburn. This is a chronic problem. The current episode started more than 1 year ago. The problem occurs occasionally. Risk factors include obesity. She has tried a PPI for the symptoms. The treatment provided moderate relief.  Diabetes She presents for her follow-up diabetic visit. She has type 2 diabetes mellitus. Hypoglycemia symptoms include nervousness/anxiousness. Associated symptoms include foot paresthesias. Pertinent negatives for diabetes include no blurred vision. Symptoms are stable. Diabetic complications include heart disease and peripheral neuropathy. Risk factors for coronary artery disease include diabetes mellitus, dyslipidemia, hypertension and sedentary lifestyle. She is following a generally healthy diet. Her overall blood glucose range is 140-180 mg/dl. An ACE inhibitor/angiotensin II receptor blocker is being taken. Eye exam is current.  Anxiety Presents for follow-up visit. Symptoms include depressed mood, excessive worry, nervous/anxious  behavior and restlessness. Patient reports no irritability or shortness of breath. Symptoms occur most days. The severity of symptoms is moderate.   Her past medical history is significant for anemia.  Depression        This is a chronic problem.  The current episode started more than 1 year ago.   The onset quality is gradual.   The problem occurs intermittently.  Associated symptoms include helplessness, hopelessness, irritable, restlessness and sad.     The symptoms are aggravated by family issues.  Past treatments include SSRIs - Selective serotonin reuptake inhibitors.  Past medical history includes anxiety.   Anemia Presents for follow-up visit. Symptoms include malaise/fatigue. There has been no bruising/bleeding easily.  Urinary Frequency  This is a chronic problem. The current episode started more than 1 year ago. The problem occurs intermittently. The problem has been waxing and waning. Associated symptoms include frequency. Pertinent negatives include no urgency.  Wrist Pain  This is a new problem. The current episode started 1 to 4 weeks ago. History of extremity trauma: hand she uses her cane.  Constipation This is a chronic problem. The current episode started more than 1 year ago. Her stool frequency is 1 time per day. She has tried laxatives for the symptoms. The treatment provided mild relief.   COPD States her breathing is stable. Worse when walking in the heat.   Review of Systems  Constitutional:  Positive for malaise/fatigue. Negative for irritability.  Eyes:  Negative for blurred vision.  Respiratory:  Negative for shortness of breath.   Gastrointestinal:  Positive for constipation and heartburn.  Genitourinary:  Positive for frequency. Negative for urgency.  Hematological:  Does not bruise/bleed easily.  Psychiatric/Behavioral:  Positive for depression. The patient is nervous/anxious.   All other systems reviewed and are  negative.     Objective:   Physical  Exam Vitals reviewed.  Constitutional:      General: She is irritable. She is not in acute distress.    Appearance: She is well-developed. She is obese.  HENT:     Head: Normocephalic and atraumatic.     Right Ear: Tympanic membrane normal.     Left Ear: Tympanic membrane normal.     Ears:   Eyes:     Pupils: Pupils are equal, round, and reactive to light.  Neck:     Thyroid: No thyromegaly.  Cardiovascular:     Rate and Rhythm: Normal rate and regular rhythm.     Heart sounds: Normal heart sounds. No murmur heard. Pulmonary:     Effort: Pulmonary effort is normal. No respiratory distress.     Breath sounds: Normal breath sounds. No wheezing.  Abdominal:     General: Bowel sounds are normal. There is no distension.     Palpations: Abdomen is soft.     Tenderness: There is no abdominal tenderness.  Musculoskeletal:        General: No tenderness. Normal range of motion.     Cervical back: Normal range of motion and neck supple.  Skin:    General: Skin is warm and dry.     Findings: Lesion present.     Comments: 0.8X0.8 mm on left tip of ear  Neurological:     Mental Status: She is alert and oriented to person, place, and time.     Cranial Nerves: No cranial nerve deficit.     Deep Tendon Reflexes: Reflexes are normal and symmetric.  Psychiatric:        Behavior: Behavior normal.        Thought Content: Thought content normal.        Judgment: Judgment normal.    Cryotherapy used on lesion on left ear. Pt tolerated well.   BP 134/80   Pulse 68   Temp 98 F (36.7 C) (Temporal)   Ht '5\' 5"'$  (1.651 m)   Wt 193 lb 6.4 oz (87.7 kg)   SpO2 96%   BMI 32.18 kg/m      Assessment & Plan:  Nioma Prusha comes in today with chief complaint of Medical Management of Chronic Issues (Left wrist and hip pain, Also spot on left ear )   Diagnosis and orders addressed:  1. Hypertension associated with diabetes ([place])  2. Chronic obstructive pulmonary disease, unspecified COPD type  ([place])  3. Gastroesophageal reflux disease, unspecified whether esophagitis present  4. Hyperlipidemia associated with type 2 diabetes mellitus ([place])  5. Type 2 diabetes mellitus with diabetic polyneuropathy, without long-term current use of insulin ([place])  6. OAB (overactive bladder)  7. Obesity (BMI 30-39.9)  8. Moderate episode of recurrent major depressive disorder ([place])  9. Controlled substance agreement signed  10. Anxiety - diazepam (VALIUM) 5 MG tablet; Take 1 tablet (5 mg total) by mouth 2 (two) times daily. Pt takes one tablet at daytime and one tablet at bedtime  Dispense: 30 tablet; Refill: 2  11. Chondrodermatitis nodularis helicis of left ear -Cryotherapy used on area. Do not pick.  Try avoiding pressure on area, do not sleep on that side.   12. Benzodiazepine dependence (HCC) - diazepam (VALIUM) 5 MG tablet; Take 1 tablet (5 mg total) by mouth 2 (two) times daily. Pt takes one tablet at daytime and one tablet at bedtime  Dispense: 30 tablet; Refill: 2  13. Osteoarthritis of multiple joints, unspecified  osteoarthritis type Start Mobic today  No other NSIAD's while taking Mobic - meloxicam (MOBIC) 7.5 MG tablet; Take 1 tablet (7.5 mg total) by mouth daily.  Dispense: 90 tablet; Refill: 1  14. Constipation, unspecified constipation type Start Linzess today - linaclotide (LINZESS) 72 MCG capsule; Take 1 capsule (72 mcg total) by mouth daily before breakfast.  Dispense: 90 capsule; Refill: 1   Labs pending Health Maintenance reviewed Diet and exercise encouraged  Follow up plan: 3 months   Evelina Dun, FNP

## 2020-10-13 ENCOUNTER — Other Ambulatory Visit: Payer: Self-pay | Admitting: Family

## 2020-10-13 DIAGNOSIS — F419 Anxiety disorder, unspecified: Secondary | ICD-10-CM

## 2020-10-17 ENCOUNTER — Other Ambulatory Visit: Payer: Self-pay | Admitting: Family

## 2020-10-17 DIAGNOSIS — J4520 Mild intermittent asthma, uncomplicated: Secondary | ICD-10-CM

## 2020-10-22 ENCOUNTER — Other Ambulatory Visit: Payer: Self-pay

## 2020-10-22 ENCOUNTER — Encounter: Payer: Self-pay | Admitting: Family Medicine

## 2020-10-22 ENCOUNTER — Ambulatory Visit (INDEPENDENT_AMBULATORY_CARE_PROVIDER_SITE_OTHER): Payer: Medicare Other | Admitting: Family Medicine

## 2020-10-22 ENCOUNTER — Telehealth: Payer: Self-pay | Admitting: Family

## 2020-10-22 VITALS — BP 139/76 | HR 79 | Temp 97.5°F | Ht 65.0 in | Wt 192.0 lb

## 2020-10-22 DIAGNOSIS — S80921A Unspecified superficial injury of right lower leg, initial encounter: Secondary | ICD-10-CM

## 2020-10-22 DIAGNOSIS — L089 Local infection of the skin and subcutaneous tissue, unspecified: Secondary | ICD-10-CM

## 2020-10-22 MED ORDER — DOXYCYCLINE HYCLATE 100 MG PO TABS: 100.0000 mg | ORAL_TABLET | Freq: Two times a day (BID) | ORAL | 0 refills | Status: AC

## 2020-10-22 NOTE — Patient Instructions (Signed)
Wound Care, Adult Taking care of your wound properly can help to prevent pain, infection, and scarring. It can also help your wound heal more quickly. Follow instructionsfrom your health care provider about how to care for your wound. Supplies needed: Soap and water. Wound cleanser. Gauze. If needed, a clean bandage (dressing) or other type of wound dressing material to cover or place in the wound. Follow your health care provider's instructions about what dressing supplies to use. Cream or ointment to apply to the wound, if told by your health care provider. How to care for your wound Cleaning the wound Ask your health care provider how to clean the wound. This may include: Using mild soap and water or a wound cleanser. Using a clean gauze to pat the wound dry after cleaning it. Do not rub or scrub the wound. Dressing care Wash your hands with soap and water for at least 20 seconds before and after you change the dressing. If soap and water are not available, use hand sanitizer. Change your dressing as told by your health care provider. This may include: Cleaning or rinsing out (irrigating) the wound. Placing a dressing over the wound or in the wound (packing). Covering the wound with an outer dressing. Leave any stitches (sutures), skin glue, or adhesive strips in place. These skin closures may need to stay in place for 2 weeks or longer. If adhesive strip edges start to loosen and curl up, you may trim the loose edges. Do not remove adhesive strips completely unless your health care provider tells you to do that. Ask your health care provider when you can leave the wound uncovered. Checking for infection Check your wound area every day for signs of infection. Check for: More redness, swelling, or pain. Fluid or blood. Warmth. Pus or a bad smell.  Follow these instructions at home Medicines If you were prescribed an antibiotic medicine, cream, or ointment, take or apply it as told by  your health care provider. Do not stop using the antibiotic even if your condition improves. If you were prescribed pain medicine, take it 30 minutes before you do any wound care or as told by your health care provider. Take over-the-counter and prescription medicines only as told by your health care provider. Eating and drinking Eat a diet that includes protein, vitamin A, vitamin C, and other nutrient-rich foods to help the wound heal. Foods rich in protein include meat, fish, eggs, dairy, beans, and nuts. Foods rich in vitamin A include carrots and dark green, leafy vegetables. Foods rich in vitamin C include citrus fruits, tomatoes, broccoli, and peppers. Drink enough fluid to keep your urine pale yellow. General instructions Do not take baths, swim, use a hot tub, or do anything that would put the wound underwater until your health care provider approves. Ask your health care provider if you may take showers. You may only be allowed to take sponge baths. Do not scratch or pick at the wound. Keep it covered as told by your health care provider. Return to your normal activities as told by your health care provider. Ask your health care provider what activities are safe for you. Protect your wound from the sun when you are outside for the first 6 months, or for as long as told by your health care provider. Cover up the scar area or apply sunscreen that has an SPF of at least 30. Do not use any products that contain nicotine or tobacco, such as cigarettes, e-cigarettes, and chewing tobacco.   These may delay wound healing. If you need help quitting, ask your health care provider. Keep all follow-up visits as told by your health care provider. This is important. Contact a health care provider if: You received a tetanus shot and you have swelling, severe pain, redness, or bleeding at the injection site. Your pain is not controlled with medicine. You have any of these signs of infection: More  redness, swelling, or pain around the wound. Fluid or blood coming from the wound. Warmth coming from the wound. Pus or a bad smell coming from the wound. A fever or chills. You are nauseous or you vomit. You are dizzy. Get help right away if: You have a red streak of skin near the area around your wound. Your wound has been closed with staples, sutures, skin glue, or adhesive strips and it begins to open up and separate. Your wound is bleeding, and the bleeding does not stop with gentle pressure. You have a rash. You faint. You have trouble breathing. These symptoms may represent a serious problem that is an emergency. Do not wait to see if the symptoms will go away. Get medical help right away. Call your local emergency services (911 in the [place] Do not drive yourself to the hospital. Summary Always wash your hands with soap and water for at least 20 seconds before and after changing your dressing. Change your dressing as told by your health care provider. To help with healing, eat foods that are rich in protein, vitamin A, vitamin C, and other nutrients. Check your wound every day for signs of infection. Contact your health care provider if you suspect that your wound is infected. This information is not intended to replace advice given to you by your health care provider. Make sure you discuss any questions you have with your healthcare provider. Document Revised: 11/30/2018 Document Reviewed: 11/30/2018 Elsevier Patient Education  2022 Elsevier Inc[employer]  
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
## 2020-10-22 NOTE — Patient Instructions (Signed)
Visit Information  PATIENT GOALS:  Goals Addressed               This Visit's Progress     Patient Stated     T2DM (pt-stated)        Current Barriers:  Unable to maintain control of t2dm  Pharmacist Clinical Goal(s):  Over the next 90 days, patient will maintain control of t2dm as evidenced by improved glycemic control  through collaboration with PharmD and provider.    Interventions: 1:1 collaboration with Sharion Balloon, FNP regarding development and update of comprehensive plan of care as evidenced by provider attestation and co-signature Inter-disciplinary care team collaboration (see longitudinal plan of care) Comprehensive medication review performed; medication list updated in electronic medical record  Diabetes: Uncontrolled--patient reports increased blood sugars after start of repatha; current treatment: jardiance 25, januvia 100, metformin 1 gBID;  A1c 6.5, recent hyperglycemia GFR 51 Current glucose readings: fasting glucose: 118-->180-190, post prandial glucose: n/a Denies hypoglycemic/hyperglycemic symptoms Discussed meal planning options and Plate method for healthy eating Avoid sugary drinks and desserts Incorporate balanced protein, non starchy veggies, 1 serving of carbohydrate with each meal Increase water intake Increase physical activity as able  Current exercise: n/a Educated on diet/medications Recommended continue medications as prescribed; will f/u in 4 weeks to review blood sugars, consider GLP1--d/c januvia if patient still elevated   Patient Goals/Self-Care Activities Over the next 90 days, patient will:  - take medications as prescribed check glucose daily (fasting), document, and provide at future appointments  Follow Up Plan: Telephone follow up appointment with care management team member scheduled for: 1 month         The patient verbalized understanding of instructions, educational materials, and care plan provided today and  declined offer to receive copy of patient instructions, educational materials, and care plan.   Telephone follow up appointment with care management team member scheduled for: 1 MONTH  Signature Regina Eck, PharmD, BCPS Clinical Pharmacist, [place]  II Phone [phone]

## 2020-10-29 ENCOUNTER — Other Ambulatory Visit: Payer: Self-pay | Admitting: Family

## 2020-10-29 DIAGNOSIS — E1142 Type 2 diabetes mellitus with diabetic polyneuropathy: Secondary | ICD-10-CM

## 2020-11-13 ENCOUNTER — Other Ambulatory Visit: Payer: Self-pay | Admitting: Family

## 2020-11-13 DIAGNOSIS — K2100 Gastro-esophageal reflux disease with esophagitis, without bleeding: Secondary | ICD-10-CM

## 2020-11-19 ENCOUNTER — Telehealth: Payer: Medicare Other

## 2020-11-19 NOTE — Telephone Encounter (Signed)
Returned call no answer will sch telephone f/u

## 2020-11-20 ENCOUNTER — Ambulatory Visit (INDEPENDENT_AMBULATORY_CARE_PROVIDER_SITE_OTHER): Payer: Medicare Other | Admitting: Pharmacist

## 2020-11-20 DIAGNOSIS — E1142 Type 2 diabetes mellitus with diabetic polyneuropathy: Secondary | ICD-10-CM

## 2020-11-20 NOTE — Patient Instructions (Signed)
Visit Information  PATIENT GOALS:  Goals Addressed               This Visit's Progress     Patient Stated     T2DM (pt-stated)        Current Barriers:  Unable to maintain control of t2dm  Pharmacist Clinical Goal(s):  Over the next 90 days, patient will maintain control of t2dm as evidenced by improved glycemic control  through collaboration with PharmD and provider.    Interventions: 1:1 collaboration with Sharion Balloon, FNP regarding development and update of comprehensive plan of care as evidenced by provider attestation and co-signature Inter-disciplinary care team collaboration (see longitudinal plan of care) Comprehensive medication review performed; medication list updated in electronic medical record  Diabetes: Uncontrolled--patient reports increased blood sugars after start of repatha; current treatment: jardiance 25, januvia 100, metformin 1 gBID;  A1c 6.5, hyperglycemia has resolved, patient reports blood sugars within normal range GFR 51 Current glucose readings: fasting glucose: 118-->130, post prandial glucose: <170 Denies hypoglycemic/hyperglycemic symptoms Discussed meal planning options and Plate method for healthy eating Avoid sugary drinks and desserts Incorporate balanced protein, non starchy veggies, 1 serving of carbohydrate with each meal Increase water intake Increase physical activity as able  Current exercise: n/a Educated on diet/medications Recommended continue medications as prescribed; will f/u in 8 weeks to review blood sugars, consider GLP1 in place of Januvia   Patient Goals/Self-Care Activities Over the next 90 days, patient will:  - take medications as prescribed check glucose daily (fasting), document, and provide at future appointments  Follow Up Plan: Telephone follow up appointment with care management team member scheduled for: 2 month         The patient verbalized understanding of instructions, educational materials,  and care plan provided today and declined offer to receive copy of patient instructions, educational materials, and care plan.   Telephone follow up appointment with care management team member scheduled for:8 WEEKS  Signature Regina Eck, PharmD, BCPS Clinical Pharmacist, [place]  II Phone [phone]

## 2020-11-20 NOTE — Progress Notes (Signed)
Chronic Care Management Pharmacy Note  11/20/2020 Name:  Denise Gardner MRN:  [id number] DOB:  06-30-1945  Summary: T2DM  Recommendations/Changes made from today's visit:  Diabetes: Uncontrolled--patient reports increased blood sugars after start of repatha; current treatment: jardiance 25, januvia 100, metformin 1 gBID;  A1c 6.5, hyperglycemia has resolved, patient reports blood sugars within normal range GFR 51 Current glucose readings: fasting glucose: 118-->130, post prandial glucose: <170 Denies hypoglycemic/hyperglycemic symptoms Discussed meal planning options and Plate method for healthy eating Avoid sugary drinks and desserts Incorporate balanced protein, non starchy veggies, 1 serving of carbohydrate with each meal Increase water intake Increase physical activity as able  Current exercise: n/a Educated on diet/medications Recommended continue medications as prescribed; will f/u in 8 weeks to review blood sugars, consider GLP1 in place of Januvia  Follow Up Plan: Telephone follow up appointment with care management team member scheduled for: 2 month  Subjective: Denise Gardner is an 75 y.o. year old female who is a primary patient of Sharion Balloon, FNP.  The CCM team was consulted for assistance with disease management and care coordination needs.    Engaged with patient by telephone for follow up visit in response to provider referral for pharmacy case management and/or care coordination services.   Consent to Services:  The patient was given information about Chronic Care Management services, agreed to services, and gave verbal consent prior to initiation of services.  Please see initial visit note for detailed documentation.   Patient Care Team: Sharion Balloon, FNP as PCP - General (Nurse Practitioner) Minus Breeding, MD as PCP - Cardiology (Cardiology) Minus Breeding, MD as Attending Physician (Cardiology) Melina Schools, OD (Optometry) Ilean China, RN as  Case Manager Lavera Guise, [facility] as Pharmacist (Family Medicine) Objective:  Lab Results  Component Value Date   CREATININE 1.11 (H) 08/25/2020   CREATININE 1.03 (H) 07/20/2020   CREATININE 1.25 (H) 06/03/2020    Lab Results  Component Value Date   HGBA1C 6.5 07/20/2020   Last diabetic Eye exam:  Lab Results  Component Value Date/Time   HMDIABEYEEXA Retinopathy (A) 03/09/2020 02:07 PM    Last diabetic Foot exam: No results found for: HMDIABFOOTEX      Component Value Date/Time   CHOL 267 (H) 03/19/2019 1210   CHOL 136 09/05/2012 1256   TRIG 112 03/19/2019 1210   TRIG 43 12/28/2012 0938   TRIG 38 09/05/2012 1256   HDL 58 03/19/2019 1210   HDL 66 12/28/2012 0938   HDL 59 09/05/2012 1256   CHOLHDL 4.6 (H) 03/19/2019 1210   LDLCALC 189 (H) 03/19/2019 1210   LDLCALC 80 12/28/2012 0938   LDLCALC 69 09/05/2012 1256    Hepatic Function Latest Ref Rng & Units 07/20/2020 06/03/2020 04/21/2020  Total Protein 6.0 - 8.5 g/dL 6.5 6.9 6.8  Albumin 3.7 - 4.7 g/dL 4.0 3.7 4.1  AST 0 - 40 IU/L '14 16 15  ' ALT 0 - 32 IU/L '10 13 12  ' Alk Phosphatase 44 - 121 IU/L 38(L) 29(L) 43(L)  Total Bilirubin 0.0 - 1.2 mg/dL 0.4 0.9 0.4    Lab Results  Component Value Date/Time   TSH 0.941 07/20/2020 11:49 AM   TSH 7.800 (H) 04/21/2020 11:59 AM    CBC Latest Ref Rng & Units 06/03/2020 04/21/2020 01/20/2020  WBC 4.0 - 10.5 K/uL 9.0 11.8(H) 9.1  Hemoglobin 12.0 - 15.0 g/dL 13.4 14.9 14.0  Hematocrit 36.0 - 46.0 % 40.4 45.1 42.6  Platelets 150 - 400 K/uL  198 222 218    Lab Results  Component Value Date/Time   VD25OH 41.83 06/03/2020 11:37 AM   VD25OH 42.97 11/20/2019 12:05 PM    Clinical ASCVD: No  The 10-year ASCVD risk score (Arnett DK, et al., 2019) is: 49.6%   Values used to calculate the score:     Age: 75 years     Sex: Female     Is Non-Hispanic African American: No     Diabetic: Yes     Tobacco smoker: No     Systolic Blood Pressure: 161 mmHg     Is BP treated: Yes     HDL  Cholesterol: 58 mg/dL     Total Cholesterol: 267 mg/dL    Other: (CHADS2VASc if Afib, PHQ9 if depression, MMRC or CAT for COPD, ACT, DEXA)  Social History   Tobacco Use  Smoking Status Never  Smokeless Tobacco Never   BP Readings from Last 3 Encounters:  10/22/20 139/76  10/12/20 134/80  08/25/20 123/74   Pulse Readings from Last 3 Encounters:  10/22/20 79  10/12/20 68  08/25/20 66   Wt Readings from Last 3 Encounters:  10/22/20 192 lb (87.1 kg)  10/12/20 193 lb 6.4 oz (87.7 kg)  08/25/20 195 lb (88.5 kg)    Assessment: Review of patient past medical history, allergies, medications, health status, including review of consultants reports, laboratory and other test data, was performed as part of comprehensive evaluation and provision of chronic care management services.   SDOH:  (Social Determinants of Health) assessments and interventions performed:    CCM Care Plan  Allergies  Allergen Reactions   Milk-Related Compounds Itching and Nausea And Vomiting   Eggs Or Egg-Derived Products Itching, Nausea And Vomiting and Other (See Comments)    headaches   Lac Bovis Other (See Comments)    Per PCP office   Other Other (See Comments)    Other reaction(s): Other (See Comments) Per [facility] Other reaction(s): Other (See Comments) Per [facility] Per [facility]   Statins    Crestor [Rosuvastatin]     Myalgias    Ezetimibe Other (See Comments)    mylagia Per PCP office    Medications Reviewed Today     Reviewed by Gwenlyn Perking, FNP (Family Nurse Practitioner) on 10/22/20 at 67  Med List Status: <None>   Medication Order Taking? Sig Documenting Provider Last Dose Status Informant  Accu-Chek FastClix Lancets MISC [id number] Yes CHECK BLOOD SUGAR UP TO FOUR TIMES DAILY Dx E11.69 Sharion Balloon, FNP Taking Active   acetaminophen (TYLENOL) 500 MG tablet [id number] Yes Take 500 mg by mouth 2 (two) times daily. Pt takes 2 tablets at bedtime  [provider] Taking Active Self  amLODipine (NORVASC) 10 MG tablet [id number] Yes TAKE 1 TABLET BY MOUTH DAILY Hawks, Christy A, FNP Taking Active   Ascorbic Acid (VITAMIN C) 100 MG tablet [id number] Yes Take 100 mg by mouth daily. [provider] Taking Active   aspirin 81 MG chewable tablet [id number] Yes Chew 81 mg by mouth daily. [provider] Taking Active   Biotin 5000 MCG TABS [id number] Yes Take by mouth. [provider] Taking Active Self  blood glucose meter kit and supplies [id number] Yes Dispense based on patient and insurance preference. Use up to four times daily as directed. E11. Whatever insurance will cover Sharion Balloon, FNP Taking Active   cholecalciferol (VITAMIN D) 400 UNITS TABS [id number] Yes Take by mouth. VITAMIN D3 daily  [provider] Taking Active   cyanocobalamin 100 MCG tablet [id number] Yes Take 100 mcg by mouth daily. [provider] Taking Active   diazepam (VALIUM) 5 MG tablet [id number] Yes Take 1 tablet (5 mg total) by mouth 2 (two) times daily. Pt takes one tablet at daytime and one tablet at bedtime Evelina Dun A, FNP Taking Active   doxycycline (VIBRA-TABS) 100 MG tablet [id number] Yes Take 1 tablet (100 mg total) by mouth 2 (two) times daily for 10 days. 1 po bid Gwenlyn Perking, FNP  Active   empagliflozin (JARDIANCE) 25 MG TABS tablet [id number] Yes Take 1 tablet (25 mg total) by mouth daily before breakfast. Sharion Balloon, FNP Taking Active   escitalopram (LEXAPRO) 10 MG tablet [id number] Yes TAKE 1 TABLET BY MOUTH DAILY Sharion Balloon, FNP Taking Active   FLOVENT HFA 110 MCG/ACT inhaler [id number] Yes INHALE 2 PUFFS INTO THE LUNGS TWICE DAILY Evelina Dun A, FNP Taking Active   fluticasone (FLONASE) 50 MCG/ACT nasal spray [id number] Yes SHAKE LIQUID AND USE 2 SPRAYS IN EACH NOSTRIL DAILY Hawks, Christy A, FNP Taking Active   gabapentin (NEURONTIN) 300 MG capsule [id number] Yes Take 300 mg in AM and  900 mg at bedtime Evelina Dun A, FNP Taking Active   Garlic 10 MG CAPS [id number] Yes Take by mouth. [provider] Taking Active   glucose blood (ACCU-CHEK GUIDE) test strip [id number] Yes CHECK BLOOD SUGAR UP TO FOUR TIMES DAILY Dx E11.69 Evelina Dun A, FNP Taking Active   hydrOXYzine (VISTARIL) 25 MG capsule [id number] Yes TAKE 1 CAPSULE(25 MG) BY MOUTH THREE TIMES DAILY AS NEEDED Sharion Balloon, FNP Taking Active   icosapent Ethyl (VASCEPA) 1 g capsule [id number] Yes TAKE 4 CAPSULES BY MOUTH EVERY DAY Hawks, Christy A, FNP Taking Active   ipratropium-albuterol (DUONEB) 0.5-2.5 (3) MG/3ML SOLN [id number] Yes USE 1 VIAL VIA NEBULIZER EVERY 6 HOURS Hawks, Christy A, FNP Taking Active   isosorbide mononitrate (IMDUR) 120 MG 24 hr tablet [id number] Yes Take 2 tablets (240 mg total) by mouth daily.  Patient taking differently: Take 120 mg by mouth daily.   Minus Breeding, MD Taking Active   levothyroxine (SYNTHROID) 100 MCG tablet [id number] Yes Take 1 tablet (100 mcg total) by mouth daily.  Patient taking differently: Take 100 mcg by mouth daily before breakfast.   Sharion Balloon, FNP Taking Active            Med Note Ilean China   Tue May 26, 2020 11:40 AM)    linaclotide Rolan Lipa) 72 MCG capsule [id number] Yes Take 1 capsule (72 mcg total) by mouth daily before breakfast. Sharion Balloon, FNP Taking Active   meloxicam (MOBIC) 7.5 MG tablet [id number] Yes Take 1 tablet (7.5 mg total) by mouth daily. Sharion Balloon, FNP Taking Active   metFORMIN (GLUCOPHAGE-XR) 500 MG 24 hr tablet [id number] Yes TAKE 2 TABLETS(1000 MG) BY MOUTH DAILY WITH BREAKFAST Hawks, Christy A, FNP Taking Active   metoprolol tartrate (LOPRESSOR) 25 MG tablet [id number] Yes Take 1 tablet (25 mg total) by mouth 2 (two) times daily. Sharion Balloon, FNP Taking Active   misoprostol (CYTOTEC) 200 MCG tablet [id number] Yes Take 200 mcg by mouth 4 (four) times daily. [provider] Taking Active    montelukast (SINGULAIR) 10 MG tablet [id number] Yes TAKE 1 TABLET(10 MG) BY MOUTH AT BEDTIME Sharion Balloon, FNP Taking Active   Multiple Vitamins-Minerals (HAIR SKIN AND NAILS FORMULA PO) [id number] Yes Take 1 tablet  by mouth daily. [provider] Taking Active   nitroGLYCERIN (NITROSTAT) 0.4 MG SL tablet [id number] Yes DISSOLVE 1 TABLET UNDER THE TONGUE EVERY 5 MINUTES AS  NEEDED FOR CHEST PAIN. MAX  OF 3 TABLETS IN 15 MINUTES. CALL 911 IF PAIN PERSISTS. Sharion Balloon, FNP Taking Active   omeprazole (PRILOSEC) 20 MG capsule [id number] Yes TAKE 1 CAPSULE BY MOUTH TWICE DAILY BEFORE A MEAL Hawks, Christy A, FNP Taking Active   raloxifene (EVISTA) 60 MG tablet [id number] Yes TAKE 1 TABLET(60 MG) BY MOUTH DAILY Sharion Balloon, FNP Taking Active   REPATHA SURECLICK 086 MG/ML SOAJ [id number] Yes ADMINISTER 1 ML UNDER THE SKIN EVERY 14 DAYS Sharion Balloon, FNP Taking Active   sitaGLIPtin (JANUVIA) 100 MG tablet [id number] Yes TAKE 1 TABLET BY MOUTH EVERY DAY Evelina Dun A, FNP Taking Active   solifenacin (VESICARE) 10 MG tablet [id number] Yes TAKE 1 TABLET(10 MG) BY MOUTH DAILY Evelina Dun A, FNP Taking Active   zinc gluconate 50 MG tablet [id number] Yes Take 50 mg by mouth daily. [provider] Taking Active             Patient Active Problem List   Diagnosis Date Noted   Precordial chest pain 03/03/2020   Controlled substance agreement signed 08/16/2018   Myalgia 11/24/2017   Iron deficiency anemia 05/26/2017   Vitamin B 12 deficiency 05/26/2017   Obesity (BMI 30-39.9) 05/25/2017   Constipation 05/19/2016   Hypokalemia 04/30/2015   Depression 07/18/2014   Diabetic neuropathy ([place]) 04/17/2014   OAB (overactive bladder) 04/17/2014   Peripheral neuropathy 04/17/2014   Hypothyroidism 12/27/2012   Osteopenia 05/30/2012   Type 2 diabetes mellitus ([place]) 05/21/2012   Asthma 05/21/2012   Chronic obstructive pulmonary disease ([place]) 05/21/2012   Gastroesophageal  reflux disease 05/21/2012   Hyperlipidemia associated with type 2 diabetes mellitus ([place]) 01/26/2011   Hypertension associated with diabetes ([place])    Anxiety    Chronic coronary artery disease     Immunization History  Administered Date(s) Administered   Influenza Inj Mdck Quad Pf 12/18/2018, 01/20/2020   Influenza, Quadrivalent, Recombinant, Inj, Pf 03/30/2016, 12/28/2016   Influenza,inj,Quad PF,6+ Mos 01/02/2018   Influenza-Unspecified 12/18/2018   Janssen (J&J) SARS-COV-2 Vaccination 06/10/2019   Pneumococcal Conjugate-13 12/20/2013   Pneumococcal Polysaccharide-23 10/22/2010   Tdap 09/05/2012   Zoster Recombinat (Shingrix) 11/29/2018, 01/30/2019    Conditions to be addressed/monitored: DMII  Care Plan : PHARMD MEDICATION MANAGEMENT  Updates made by Lavera Guise, [place] since 11/20/2020 12:00 AM     Problem: DISEASE PROGRESSION PREVENTION      Long-Range Goal: T2DM   Recent Progress: Not on track  Priority: High  Note:   Current Barriers:  Unable to maintain control of t2dm  Pharmacist Clinical Goal(s):  Over the next 90 days, patient will maintain control of t2dm as evidenced by improved glycemic control  through collaboration with PharmD and provider.    Interventions: 1:1 collaboration with Sharion Balloon, FNP regarding development and update of comprehensive plan of care as evidenced by provider attestation and co-signature Inter-disciplinary care team collaboration (see longitudinal plan of care) Comprehensive medication review performed; medication list updated in electronic medical record  Diabetes: Uncontrolled--patient reports increased blood sugars after start of repatha; current treatment: jardiance 25, januvia 100, metformin 1 gBID;  A1c 6.5, hyperglycemia has resolved, patient reports blood sugars within normal range GFR 51 Current glucose readings: fasting glucose: 118-->130, post prandial glucose: <170 Denies hypoglycemic/hyperglycemic  symptoms Discussed meal planning options and Plate  method for healthy eating Avoid sugary drinks and desserts Incorporate balanced protein, non starchy veggies, 1 serving of carbohydrate with each meal Increase water intake Increase physical activity as able  Current exercise: n/a Educated on diet/medications Recommended continue medications as prescribed ; will f/u in 8 weeks to review blood sugars, consider GLP1 in place of Januvia   Patient Goals/Self-Care Activities Over the next 90 days, patient will:  - take medications as prescribed check glucose daily (fasting), document, and provide at future appointments  Follow Up Plan: Telephone follow up appointment with care management team member scheduled for: 2 month      Medication Assistance: None required.  Patient affirms current coverage meets needs.  Patient's preferred pharmacy is:  [facility] [address] [place], [place] - [address] AT [facility] OF [facility] [address] [place] [address] Phone: [phone] Fax: [phone]  [facility]  ([place], [place] [address] [place] [address] [address] Phone: [phone] Fax: [place] [address] [place] - [place] [address] [place] [address] Phone: [phone] Fax: [phone]  Follow Up:  Patient agrees to Care Plan and Follow-up.  Plan: Telephone follow up appointment with care management team member scheduled for:  8 WEEKS   Regina Eck, PharmD, BCPS Clinical Pharmacist, [place]  II Phone [phone]

## 2020-11-27 DIAGNOSIS — E1142 Type 2 diabetes mellitus with diabetic polyneuropathy: Secondary | ICD-10-CM

## 2020-11-28 ENCOUNTER — Other Ambulatory Visit: Payer: Self-pay | Admitting: Family

## 2020-11-28 DIAGNOSIS — E1159 Type 2 diabetes mellitus with other circulatory complications: Secondary | ICD-10-CM

## 2020-11-28 DIAGNOSIS — M8580 Other specified disorders of bone density and structure, unspecified site: Secondary | ICD-10-CM

## 2020-11-28 DIAGNOSIS — I152 Hypertension secondary to endocrine disorders: Secondary | ICD-10-CM

## 2020-12-04 ENCOUNTER — Other Ambulatory Visit: Payer: Self-pay | Admitting: Cardiology

## 2020-12-05 ENCOUNTER — Other Ambulatory Visit: Payer: Self-pay | Admitting: Family

## 2020-12-05 DIAGNOSIS — F32A Depression, unspecified: Secondary | ICD-10-CM

## 2020-12-05 DIAGNOSIS — F419 Anxiety disorder, unspecified: Secondary | ICD-10-CM

## 2020-12-10 ENCOUNTER — Inpatient Hospital Stay (HOSPITAL_BASED_OUTPATIENT_CLINIC_OR_DEPARTMENT_OTHER): Payer: Medicare Other | Admitting: Physician Assistant

## 2020-12-10 ENCOUNTER — Encounter (HOSPITAL_COMMUNITY): Payer: Self-pay | Admitting: Adult Health

## 2020-12-10 ENCOUNTER — Other Ambulatory Visit: Payer: Self-pay

## 2020-12-10 ENCOUNTER — Inpatient Hospital Stay (HOSPITAL_COMMUNITY): Payer: Medicare Other | Attending: Hematology

## 2020-12-10 VITALS — BP 142/63 | HR 71 | Temp 97.5°F | Resp 18 | Wt 197.1 lb

## 2020-12-10 DIAGNOSIS — N183 Chronic kidney disease, stage 3 unspecified: Secondary | ICD-10-CM | POA: Diagnosis not present

## 2020-12-10 DIAGNOSIS — I129 Hypertensive chronic kidney disease with stage 1 through stage 4 chronic kidney disease, or unspecified chronic kidney disease: Secondary | ICD-10-CM | POA: Insufficient documentation

## 2020-12-10 DIAGNOSIS — D509 Iron deficiency anemia, unspecified: Secondary | ICD-10-CM | POA: Diagnosis not present

## 2020-12-10 DIAGNOSIS — E1122 Type 2 diabetes mellitus with diabetic chronic kidney disease: Secondary | ICD-10-CM | POA: Diagnosis not present

## 2020-12-10 DIAGNOSIS — E538 Deficiency of other specified B group vitamins: Secondary | ICD-10-CM

## 2020-12-10 DIAGNOSIS — Z7982 Long term (current) use of aspirin: Secondary | ICD-10-CM | POA: Diagnosis not present

## 2020-12-10 LAB — CBC WITH DIFFERENTIAL/PLATELET
Abs Immature Granulocytes: 0.03 10*3/uL (ref 0.00–0.07)
Basophils Absolute: 0.1 10*3/uL (ref 0.0–0.1)
Basophils Relative: 1 %
Eosinophils Absolute: 0.5 10*3/uL (ref 0.0–0.5)
Eosinophils Relative: 6 %
HCT: 41.6 % (ref 36.0–46.0)
Hemoglobin: 13.4 g/dL (ref 12.0–15.0)
Immature Granulocytes: 0 %
Lymphocytes Relative: 35 %
Lymphs Abs: 2.8 10*3/uL (ref 0.7–4.0)
MCH: 29.3 pg (ref 26.0–34.0)
MCHC: 32.2 g/dL (ref 30.0–36.0)
MCV: 91 fL (ref 80.0–100.0)
Monocytes Absolute: 0.7 10*3/uL (ref 0.1–1.0)
Monocytes Relative: 9 %
Neutro Abs: 3.9 10*3/uL (ref 1.7–7.7)
Neutrophils Relative %: 49 %
Platelets: 208 10*3/uL (ref 150–400)
RBC: 4.57 MIL/uL (ref 3.87–5.11)
RDW: 13.7 % (ref 11.5–15.5)
WBC: 8 10*3/uL (ref 4.0–10.5)
nRBC: 0 % (ref 0.0–0.2)

## 2020-12-10 LAB — IRON AND TIBC
Iron: 70 ug/dL (ref 28–170)
Saturation Ratios: 22 % (ref 10.4–31.8)
TIBC: 315 ug/dL (ref 250–450)
UIBC: 245 ug/dL

## 2020-12-10 LAB — VITAMIN B12: Vitamin B-12: 252 pg/mL (ref 180–914)

## 2020-12-10 LAB — FERRITIN: Ferritin: 73 ng/mL (ref 11–307)

## 2020-12-10 NOTE — Patient Instructions (Signed)
[place] [facility] Discharge Instructions  You were seen today by Tarri Abernethy PA-C for your history of iron deficiency anemia.  Your blood and iron levels looked great at today's visit!  Although your iron level is still good, it is a bit lower than it was the last time you were here, so we would like to check it again in 6 months in case you need more IV iron.  Your B12 level is also normal, but lower than it was the last time it was checked.  We would like you to start taking vitamin B12 every day.  LABS: Return in 6 months for repeat labs  MEDICATIONS: - START taking vitamin B12 (cyanocobalamin) 500 mcg daily - Try taking iron (ferrous sulfate 325 mg) EVERY OTHER DAY and use stool softeners as needed for constipation.  FOLLOW-UP APPOINTMENT: Phone visit in 6 months, after labs   Thank you for choosing [place] [facility] to provide your oncology and hematology care.  To afford each patient quality time with our provider, please arrive at least 15 minutes before your scheduled appointment time.   If you have a lab appointment with the [place] [facility] please come in thru the Main Entrance and check in at the main information desk.  You need to re-schedule your appointment should you arrive 10 or more minutes late.  We strive to give you quality time with our providers, and arriving late affects you and other patients whose appointments are after yours.  Also, if you no show three or more times for appointments you may be dismissed from the clinic at the providers discretion.     Again, thank you for choosing [facility].  Our hope is that these requests will decrease the amount of time that you wait before being seen by our physicians.       _____________________________________________________________  Should you have questions after your visit to [facility], please contact our office at [phone] and follow the prompts.  Our office hours are 8:00 a.m. and 4:30 p.m. Monday - Friday.  Please note that voicemails left after 4:00 p.m. may not be returned until the following business day.  We are closed weekends and major holidays.  You do have access to a nurse 24-7, just call the main number to the clinic [phone] and do not press any options, hold on the line and a nurse will answer the phone.    For prescription refill requests, have your pharmacy contact our office and allow 72 hours.    Due to Covid, you will need to wear a mask upon entering the hospital. If you do not have a mask, a mask will be given to you at the Main Entrance upon arrival. For doctor visits, patients may have 1 support person age 35 or older with them. For treatment visits, patients can not have anyone with them due to social distancing guidelines and our immunocompromised population.
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
## 2020-12-11 ENCOUNTER — Other Ambulatory Visit: Payer: Self-pay | Admitting: Family

## 2020-12-11 DIAGNOSIS — E1149 Type 2 diabetes mellitus with other diabetic neurological complication: Secondary | ICD-10-CM

## 2020-12-15 ENCOUNTER — Other Ambulatory Visit: Payer: Self-pay | Admitting: Family

## 2020-12-15 DIAGNOSIS — F419 Anxiety disorder, unspecified: Secondary | ICD-10-CM

## 2020-12-16 NOTE — Telephone Encounter (Signed)
Last office visit 10/12/20 Last refill 10/13/20, #90, 1 refill

## 2021-01-01 ENCOUNTER — Telehealth: Payer: Medicare Other

## 2021-01-04 ENCOUNTER — Ambulatory Visit (INDEPENDENT_AMBULATORY_CARE_PROVIDER_SITE_OTHER): Payer: Medicare Other | Admitting: Licensed Clinical Social Worker

## 2021-01-04 DIAGNOSIS — E785 Hyperlipidemia, unspecified: Secondary | ICD-10-CM

## 2021-01-04 DIAGNOSIS — F331 Major depressive disorder, recurrent, moderate: Secondary | ICD-10-CM

## 2021-01-04 DIAGNOSIS — E1159 Type 2 diabetes mellitus with other circulatory complications: Secondary | ICD-10-CM

## 2021-01-04 DIAGNOSIS — I2510 Atherosclerotic heart disease of native coronary artery without angina pectoris: Secondary | ICD-10-CM

## 2021-01-04 DIAGNOSIS — J449 Chronic obstructive pulmonary disease, unspecified: Secondary | ICD-10-CM

## 2021-01-04 DIAGNOSIS — E1142 Type 2 diabetes mellitus with diabetic polyneuropathy: Secondary | ICD-10-CM

## 2021-01-04 DIAGNOSIS — E1169 Type 2 diabetes mellitus with other specified complication: Secondary | ICD-10-CM

## 2021-01-04 DIAGNOSIS — I152 Hypertension secondary to endocrine disorders: Secondary | ICD-10-CM

## 2021-01-04 NOTE — Chronic Care Management (AMB) (Signed)
Chronic Care Management    Clinical Social Work Note  01/04/2021 Name: Denise Gardner MRN: [id number] DOB: [birth year]  Denise Gardner is a 75 y.o. year old female who is a primary care patient of Sharion Balloon, FNP. The CCM team was consulted to assist the patient with chronic disease management and/or care coordination needs related to: [employer] .   Engaged with patient by telephone for initial visit in response to provider referral for social work chronic care management and care coordination services.   Consent to Services:  The patient was given the following information about Chronic Care Management services today, agreed to services, and gave verbal consent: 1. CCM service includes personalized support from designated clinical staff supervised by the primary care provider, including individualized plan of care and coordination with other care providers 2. 24/7 contact phone numbers for assistance for urgent and routine care needs. 3. Service will only be billed when office clinical staff spend 20 minutes or more in a month to coordinate care. 4. Only one practitioner may furnish and bill the service in a calendar month. 5.The patient may stop CCM services at any time (effective at the end of the month) by phone call to the office staff. 6. The patient will be responsible for cost sharing (co-pay) of up to 20% of the service fee (after annual deductible is met). Patient agreed to services and consent obtained.  Patient agreed to services and consent obtained.   Assessment: Review of patient past medical history, allergies, medications, and health status, including review of relevant consultants reports was performed today as part of a comprehensive evaluation and provision of chronic care management and care coordination services.     SDOH (Social Determinants of Health) assessments and interventions performed:  SDOH Interventions    Flowsheet Row Most Recent Value  SDOH  Interventions   Physical Activity Interventions Other (Comments)  [uses a cane to help her walk]  Stress Interventions Provide Counseling  [client has stress over finances,  client has stress over managing her medical needs]  Depression Interventions/Treatment  Medication        Advanced Directives Status: See Vynca application for related entries.  CCM Care Plan  Allergies  Allergen Reactions   Milk-Related Compounds Itching and Nausea And Vomiting   Eggs Or Egg-Derived Products Itching, Nausea And Vomiting and Other (See Comments)    headaches   Lac Bovis Other (See Comments)    Per PCP office   Other Other (See Comments)    Other reaction(s): Other (See Comments) Per [facility] Other reaction(s): Other (See Comments) Per [facility]   Statins    Crestor [Rosuvastatin]     Myalgias    Ezetimibe Other (See Comments)    mylagia Per PCP office    Outpatient Encounter Medications as of 01/04/2021  Medication Sig   Accu-Chek FastClix Lancets MISC CHECK BLOOD SUGAR UP TO FOUR TIMES DAILYDx E11.9   acetaminophen (TYLENOL) 500 MG tablet Take 500 mg by mouth 2 (two) times daily. Pt takes 2 tablets at bedtime   amLODipine (NORVASC) 10 MG tablet TAKE 1 TABLET BY MOUTH DAILY   Ascorbic Acid (VITAMIN C) 100 MG tablet Take 100 mg by mouth daily.   aspirin 81 MG chewable tablet Chew 81 mg by mouth daily.   Biotin 5000 MCG TABS Take by mouth.   blood glucose meter kit and supplies Dispense based on patient and insurance preference. Use up to four times daily as  directed. E11. Whatever insurance will cover   cholecalciferol (VITAMIN D) 400 UNITS TABS Take by mouth. VITAMIN D3 daily    cyanocobalamin 100 MCG tablet Take 100 mcg by mouth daily.   diazepam (VALIUM) 5 MG tablet Take 1 tablet (5 mg total) by mouth 2 (two) times daily. Pt takes one tablet at daytime and one tablet at bedtime   empagliflozin (JARDIANCE) 25 MG TABS tablet Take 1 tablet (25 mg  total) by mouth daily before breakfast.   escitalopram (LEXAPRO) 10 MG tablet TAKE 1 TABLET BY MOUTH DAILY   FLOVENT HFA 110 MCG/ACT inhaler INHALE 2 PUFFS INTO THE LUNGS TWICE DAILY   fluticasone (FLONASE) 50 MCG/ACT nasal spray SHAKE LIQUID AND USE 2 SPRAYS IN EACH NOSTRIL DAILY   gabapentin (NEURONTIN) 300 MG capsule Take 300 mg in AM and 900 mg at bedtime   Garlic 10 MG CAPS Take by mouth.   glucose blood (ACCU-CHEK GUIDE) test strip CHECK BLOOD SUGAR UP TO FOUR TIMES DAILY Dx E11.69   hydrOXYzine (VISTARIL) 25 MG capsule TAKE 1 CAPSULE(25 MG) BY MOUTH THREE TIMES DAILY AS NEEDED   icosapent Ethyl (VASCEPA) 1 g capsule TAKE 4 CAPSULES BY MOUTH EVERY DAY   ipratropium-albuterol (DUONEB) 0.5-2.5 (3) MG/3ML SOLN USE 1 VIAL VIA NEBULIZER EVERY 6 HOURS   isosorbide mononitrate (IMDUR) 120 MG 24 hr tablet TAKE 1 TABLET(120 MG) BY MOUTH DAILY   levothyroxine (SYNTHROID) 100 MCG tablet TAKE 1 TABLET(100 MCG) BY MOUTH DAILY   linaclotide (LINZESS) 72 MCG capsule Take 1 capsule (72 mcg total) by mouth daily before breakfast.   meloxicam (MOBIC) 7.5 MG tablet Take 1 tablet (7.5 mg total) by mouth daily.   metFORMIN (GLUCOPHAGE-XR) 500 MG 24 hr tablet TAKE 2 TABLETS(1000 MG) BY MOUTH DAILY WITH BREAKFAST   metoprolol tartrate (LOPRESSOR) 25 MG tablet TAKE 1 TABLET(25 MG) BY MOUTH TWICE DAILY   misoprostol (CYTOTEC) 200 MCG tablet Take 200 mcg by mouth 4 (four) times daily.   montelukast (SINGULAIR) 10 MG tablet TAKE 1 TABLET(10 MG) BY MOUTH AT BEDTIME   Multiple Vitamins-Minerals (HAIR SKIN AND NAILS FORMULA PO) Take 1 tablet by mouth daily.   nitroGLYCERIN (NITROSTAT) 0.4 MG SL tablet DISSOLVE 1 TABLET UNDER THE TONGUE EVERY 5 MINUTES AS  NEEDED FOR CHEST PAIN. MAX  OF 3 TABLETS IN 15 MINUTES. CALL 911 IF PAIN PERSISTS. (Patient not taking: Reported on 12/10/2020)   omeprazole (PRILOSEC) 20 MG capsule TAKE 1 CAPSULE BY MOUTH TWICE DAILY BEFORE A MEAL   raloxifene (EVISTA) 60 MG tablet TAKE 1 TABLET(60  MG) BY MOUTH DAILY   REPATHA SURECLICK 633 MG/ML SOAJ ADMINISTER 1 ML UNDER THE SKIN EVERY 14 DAYS   sitaGLIPtin (JANUVIA) 100 MG tablet TAKE 1 TABLET BY MOUTH EVERY DAY   solifenacin (VESICARE) 10 MG tablet TAKE 1 TABLET(10 MG) BY MOUTH DAILY   zinc gluconate 50 MG tablet Take 50 mg by mouth daily.   No facility-administered encounter medications on file as of 01/04/2021.    Patient Active Problem List   Diagnosis Date Noted   Precordial chest pain 03/03/2020   Controlled substance agreement signed 08/16/2018   Myalgia 11/24/2017   Iron deficiency anemia 05/26/2017   Vitamin B 12 deficiency 05/26/2017   Obesity (BMI 30-39.9) 05/25/2017   Constipation 05/19/2016   Hypokalemia 04/30/2015   Depression 07/18/2014   Diabetic neuropathy ([place]) 04/17/2014   OAB (overactive bladder) 04/17/2014   Peripheral neuropathy 04/17/2014   Hypothyroidism 12/27/2012   Osteopenia 05/30/2012   Type 2 diabetes  mellitus ([place]) 05/21/2012   Asthma 05/21/2012   Chronic obstructive pulmonary disease ([place]) 05/21/2012   Gastroesophageal reflux disease 05/21/2012   Hyperlipidemia associated with type 2 diabetes mellitus ([place]) 01/26/2011   Hypertension associated with diabetes ([place])    Anxiety    Chronic coronary artery disease     Conditions to be addressed/monitored: monitor client completion of ADLs; monitor client management of depression issues  Care Plan : LCSW care plan  Updates made by Katha Cabal, LCSW since 01/04/2021 12:00 AM     Problem: Coping Skills (General Plan of Care)      Goal: Coping Skills Enhanced; Manage ADLs; Manage Medical Conditions   Start Date: 01/04/2021  Expected End Date: 04/02/2021  This Visit's Progress: On track  Priority: Medium  Note:   Current barriers:   Patient in need of assistance with connecting to community resources for possible help with ADLs completion Edema issues Memory issues Financial challenges Mobility issues  Clinical Goals:  LCSW to  communicate with client in next 30 days to discuss client completion of ADLs and client completion of daily activities LCSW to communicate with client in next 30 days to discuss pain issues of client and mobility issues of client Client to communicate in next 30 days with Dr. Lottie Dawson, [facility] Pharmacist, to discuss medication questions of client  Clinical Interventions:  Collaboration with Sharion Balloon, FNP regarding development and update of comprehensive plan of care as evidenced by provider attestation and co-signature Assessment of needs, barriers of client. Discussed with [place] edema issues and pain in her feet.  She said pain in her feet may keep her from sleeping well at night.  She said she has edema in her ankles and burning pain in her feet. Discussed with client her family support. She said she has support from her brother, Inocente Salles and from  a sister. Discussed with client her medication procurement. She said she has her prescribed medications. She did have numerous medication questions. LCSW sent message today to Dr. Lottie Dawson regarding client medication questions. Client had questions about her Diabetes medication and had questions about possibility of weaning from some of her medications. Reviewed client energy level. She said she is often fatigued and has low energy Reviewed with client her walking challenges. She said she uses a cane to walk. She said sometimes she gets short of breath when walking and has to take rest breaks. Reviewed memory issues of client. Client said she is having memory problems.   Reviewed mood status of client. Client said she thought her mood was stable. She said she did not always take Valium as prescribed. She could not remember if she takes Lexapro or not Provided counseling support Encouraged client to call RNCM as needed in next 30 days for CCM nursing support Discussed housing situation of client. She said she lived in a double wide mobile home.  She said she did not have any problems accessing the mobile home.  Patient Coping Skills Has support from her brother Attends scheduled medical appointments  Patient Deficits: Memory issues Financial challenges Some walking challenges  Patient Goals: In  next 30 days, patient will: Attend scheduled medical appointments Call RNCM as needed to discuss nursing needs Communicate with LCSW as needed to discuss social work needs -  Follow Up Plan: LCSW to call client on 03/04/21 at 10:00 AM to assess client needs     Norva Riffle.Sheikh Leverich MSW, LCSW Licensed Clinical Social Worker [facility] Care Management [phone]

## 2021-01-04 NOTE — Patient Instructions (Addendum)
Visit Information  Patient Goals:  Protect My Health. Complete ADLs. Manage Depression issues  Timeframe:  Short-Term Goal Priority:  Medium Progress:  On Track Start Date:    01/04/21               Expected End Date:  04/02/21                     Follow Up Date 03/04/21 at 10:00 AM   Protect My Health: Complete ADLs. Manage Depression issues    Why is this important?   Screening tests can find diseases early when they are easier to treat.  Your doctor or nurse will talk with you about which tests are important for you.  Getting shots for common diseases like the flu and shingles will help prevent them.     Patient Coping Skills Has support from her brother Attends scheduled medical appointments  Patient Deficits: Memory issues Financial challenges Some walking challenges  Patient Goals: In  next 30 days, patient will: Attend scheduled medical appointments Call RNCM as needed to discuss nursing needs Communicate with LCSW as needed to discuss social work needs -  Follow Up Plan: LCSW to call client on 03/04/21 at 10:00 AM to assess client needs   Norva Riffle.Aritha Huckeba MSW, LCSW Licensed Clinical Social Worker [facility] Care Management [phone]

## 2021-01-05 ENCOUNTER — Other Ambulatory Visit: Payer: Self-pay | Admitting: Family

## 2021-01-05 DIAGNOSIS — I152 Hypertension secondary to endocrine disorders: Secondary | ICD-10-CM

## 2021-01-05 DIAGNOSIS — E1159 Type 2 diabetes mellitus with other circulatory complications: Secondary | ICD-10-CM

## 2021-01-05 DIAGNOSIS — E1142 Type 2 diabetes mellitus with diabetic polyneuropathy: Secondary | ICD-10-CM

## 2021-01-05 DIAGNOSIS — M8580 Other specified disorders of bone density and structure, unspecified site: Secondary | ICD-10-CM

## 2021-01-08 ENCOUNTER — Telehealth: Payer: Self-pay

## 2021-01-08 NOTE — Chronic Care Management (AMB) (Signed)
  Chronic Care Management   Note  01/08/2021 Name: Denise Gardner MRN: [id number] DOB: 1945/05/08  Denise Gardner is a 75 y.o. year old female who is a primary care patient of Sharion Balloon, FNP. Denise Gardner is currently enrolled in care management services. An additional referral for RN CM  was placed.   Follow up plan: Telephone appointment with care management team member scheduled for:01/19/2021  Denise Gardner, Denise Gardner, [place], [place] [address] Direct Dial: [phone] [email or web address] Website: [email or web address]

## 2021-01-12 ENCOUNTER — Ambulatory Visit (INDEPENDENT_AMBULATORY_CARE_PROVIDER_SITE_OTHER): Payer: Medicare Other | Admitting: Family

## 2021-01-12 ENCOUNTER — Other Ambulatory Visit: Payer: Self-pay

## 2021-01-12 ENCOUNTER — Encounter: Payer: Self-pay | Admitting: Family

## 2021-01-12 ENCOUNTER — Other Ambulatory Visit: Payer: Self-pay | Admitting: Family

## 2021-01-12 VITALS — BP 143/82 | HR 73 | Temp 97.7°F | Ht 65.0 in | Wt 196.4 lb

## 2021-01-12 DIAGNOSIS — Z1231 Encounter for screening mammogram for malignant neoplasm of breast: Secondary | ICD-10-CM

## 2021-01-12 DIAGNOSIS — E1159 Type 2 diabetes mellitus with other circulatory complications: Secondary | ICD-10-CM | POA: Diagnosis not present

## 2021-01-12 DIAGNOSIS — E785 Hyperlipidemia, unspecified: Secondary | ICD-10-CM

## 2021-01-12 DIAGNOSIS — E1169 Type 2 diabetes mellitus with other specified complication: Secondary | ICD-10-CM

## 2021-01-12 DIAGNOSIS — F419 Anxiety disorder, unspecified: Secondary | ICD-10-CM | POA: Diagnosis not present

## 2021-01-12 DIAGNOSIS — K5902 Outlet dysfunction constipation: Secondary | ICD-10-CM

## 2021-01-12 DIAGNOSIS — Z79899 Other long term (current) drug therapy: Secondary | ICD-10-CM | POA: Diagnosis not present

## 2021-01-12 DIAGNOSIS — E1142 Type 2 diabetes mellitus with diabetic polyneuropathy: Secondary | ICD-10-CM

## 2021-01-12 DIAGNOSIS — F1320 Sedative, hypnotic or anxiolytic dependence, uncomplicated: Secondary | ICD-10-CM | POA: Diagnosis not present

## 2021-01-12 DIAGNOSIS — K219 Gastro-esophageal reflux disease without esophagitis: Secondary | ICD-10-CM

## 2021-01-12 DIAGNOSIS — I2510 Atherosclerotic heart disease of native coronary artery without angina pectoris: Secondary | ICD-10-CM | POA: Diagnosis not present

## 2021-01-12 DIAGNOSIS — E669 Obesity, unspecified: Secondary | ICD-10-CM

## 2021-01-12 DIAGNOSIS — J449 Chronic obstructive pulmonary disease, unspecified: Secondary | ICD-10-CM | POA: Diagnosis not present

## 2021-01-12 DIAGNOSIS — F331 Major depressive disorder, recurrent, moderate: Secondary | ICD-10-CM

## 2021-01-12 DIAGNOSIS — H9313 Tinnitus, bilateral: Secondary | ICD-10-CM

## 2021-01-12 DIAGNOSIS — E039 Hypothyroidism, unspecified: Secondary | ICD-10-CM

## 2021-01-12 DIAGNOSIS — I152 Hypertension secondary to endocrine disorders: Secondary | ICD-10-CM

## 2021-01-12 LAB — BAYER DCA HB A1C WAIVED: HB A1C (BAYER DCA - WAIVED): 6.4 % — ABNORMAL HIGH (ref 4.8–5.6)

## 2021-01-12 MED ORDER — NITROGLYCERIN 0.4 MG SL SUBL: SUBLINGUAL_TABLET | SUBLINGUAL | 3 refills | Status: DC

## 2021-01-12 MED ORDER — DIAZEPAM 5 MG PO TABS: 5.0000 mg | ORAL_TABLET | Freq: Two times a day (BID) | ORAL | 2 refills | Status: DC

## 2021-01-12 NOTE — Progress Notes (Signed)
Subjective:    Patient ID: Denise Gardner, female    DOB: 05-02-1945, 75 y.o.   MRN: [id number]   Chief Complaint  Patient presents with   Medical Management of Chronic Issues    Buzzing in both ears    Fall   Pt presents to the office today for chronic follow up. She is followed by  Cardiologists annually for CAD. She is followed by Hematologists  for iron deficiency anemia and Vit B 12 deficiency. Hypertension This is a chronic problem. The current episode started more than 1 year ago. The problem has been waxing and waning since onset. The problem is uncontrolled. Associated symptoms include anxiety, malaise/fatigue and peripheral edema. Risk factors for coronary artery disease include dyslipidemia, diabetes mellitus and obesity. The current treatment provides moderate improvement. Identifiable causes of hypertension include a thyroid problem.  Gastroesophageal Reflux She complains of belching, heartburn and a hoarse voice. This is a chronic problem. The current episode started more than 1 year ago. The problem occurs occasionally. Pertinent negatives include no fatigue. Risk factors include obesity. She has tried a PPI for the symptoms. The treatment provided moderate relief.  Thyroid Problem Presents for follow-up visit. Symptoms include anxiety, constipation, depressed mood, diarrhea and hoarse voice. Patient reports no fatigue. The symptoms have been stable. Her past medical history is significant for hyperlipidemia.  Hyperlipidemia This is a chronic problem. The current episode started more than 1 year ago. Exacerbating diseases include obesity. Current antihyperlipidemic treatment includes diet change and statins. The current treatment provides moderate improvement of lipids. Risk factors for coronary artery disease include dyslipidemia, diabetes mellitus, hypertension, a sedentary lifestyle and post-menopausal.  Depression        This is a chronic problem.  The current episode started  more than 1 year ago.   The problem occurs intermittently.  Associated symptoms include irritable, decreased interest and sad.  Associated symptoms include no fatigue, no helplessness and no hopelessness.  Past medical history includes thyroid problem and anxiety.   Anxiety Presents for follow-up visit. Symptoms include depressed mood, irritability and nervous/anxious behavior. Symptoms occur most days. The severity of symptoms is moderate.   Her past medical history is significant for anemia.  Constipation This is a chronic problem. The current episode started more than 1 year ago. The problem has been waxing and waning since onset. Her stool frequency is 1 time per day. Associated symptoms include diarrhea. Risk factors include obesity. She has tried laxatives for the symptoms. The treatment provided moderate relief.  Anemia Symptoms include malaise/fatigue.  COPD   Review of Systems  Constitutional:  Positive for irritability and malaise/fatigue. Negative for fatigue.  HENT:  Positive for hoarse voice.   Gastrointestinal:  Positive for constipation, diarrhea and heartburn.  Psychiatric/Behavioral:  Positive for depression. The patient is nervous/anxious.   All other systems reviewed and are negative.     Objective:   Physical Exam Vitals reviewed.  Constitutional:      General: She is irritable. She is not in acute distress.    Appearance: She is well-developed. She is obese.  HENT:     Head: Normocephalic and atraumatic.     Right Ear: Tympanic membrane normal.     Left Ear: Tympanic membrane normal.  Eyes:     Pupils: Pupils are equal, round, and reactive to light.  Neck:     Thyroid: No thyromegaly.  Cardiovascular:     Rate and Rhythm: Normal rate and regular rhythm.     Heart sounds:  Normal heart sounds. No murmur heard. Pulmonary:     Effort: Pulmonary effort is normal. No respiratory distress.     Breath sounds: Normal breath sounds. No wheezing.  Abdominal:      General: Bowel sounds are normal. There is no distension.     Palpations: Abdomen is soft.     Tenderness: There is no abdominal tenderness.  Musculoskeletal:        General: No tenderness. Normal range of motion.     Cervical back: Normal range of motion and neck supple.     Right lower leg: Edema (trace) present.     Left lower leg: Edema (trace) present.  Skin:    General: Skin is warm and dry.  Neurological:     Mental Status: She is alert and oriented to person, place, and time.     Cranial Nerves: No cranial nerve deficit.     Motor: Weakness (using cane) present.     Gait: Gait normal.     Deep Tendon Reflexes: Reflexes are normal and symmetric.  Psychiatric:        Behavior: Behavior normal.        Thought Content: Thought content normal.        Judgment: Judgment normal.       BP (!) 143/82   Pulse 73   Temp 97.7 F (36.5 C) (Temporal)   Ht 5\' 5"  (1.651 m)   Wt 196 lb 6.4 oz (89.1 kg)   BMI 32.68 kg/m   Assessment & Plan:   Paiten Boies comes in today with chief complaint of Medical Management of Chronic Issues (Buzzing in both ears ) and Fall   Diagnosis and orders addressed:  1. Anxiety - diazepam (VALIUM) 5 MG tablet; Take 1 tablet (5 mg total) by mouth 2 (two) times daily. Pt takes one tablet at daytime and one tablet at bedtime  Dispense: 30 tablet; Refill: 2 - CMP14+EGFR  2. Benzodiazepine dependence (HCC) - diazepam (VALIUM) 5 MG tablet; Take 1 tablet (5 mg total) by mouth 2 (two) times daily. Pt takes one tablet at daytime and one tablet at bedtime  Dispense: 30 tablet; Refill: 2 - CMP14+EGFR  3. Hypertension associated with diabetes (HCC) - CMP14+EGFR  4. Chronic coronary artery disease - CMP14+EGFR - nitroGLYCERIN (NITROSTAT) 0.4 MG SL tablet; DISSOLVE 1 TABLET UNDER THE TONGUE EVERY 5 MINUTES AS  NEEDED FOR CHEST PAIN. MAX  OF 3 TAB 15 MINUTES. CALL 911 IF PAIN PERSISTS.  Dispense: 100 tablet; Refill: 3  5. Chronic obstructive pulmonary  disease, unspecified COPD type (HCC) - CMP14+EGFR  6. Gastroesophageal reflux disease, unspecified whether esophagitis present - CMP14+EGFR  7. Diabetic polyneuropathy associated with type 2 diabetes mellitus (HCC) - CMP14+EGFR  8. Hypothyroidism, unspecified type - CMP14+EGFR  9. Type 2 diabetes mellitus with diabetic polyneuropathy, without long-term current use of insulin (HCC) - CMP14+EGFR - Bayer DCA Hb A1c Waived - Microalbumin / creatinine urine ratio  10. Hyperlipidemia associated with type 2 diabetes mellitus (HCC) - CMP14+EGFR  11. Constipation due to outlet dysfunction - CMP14+EGFR  12. Obesity (BMI 30-39.9) - CMP14+EGFR  13. Controlled substance agreement signed - CMP14+EGFR  14. Moderate episode of recurrent major depressive disorder (HCC) - CMP14+EGFR  15. Tinnitus of both ears - No infection or earwax present   Labs pending Patient reviewed in [place] controlled database, no flags noted. Contract and drug screen are up to date.  Health Maintenance reviewed Diet and exercise encouraged  Follow up plan: 3 months  Evelina Dun, FNP

## 2021-01-12 NOTE — Patient Instructions (Signed)
Tinnitus Tinnitus refers to hearing a sound when there is no actual source for that sound. This is often described as ringing in the ears. However, people with this condition may hear a variety of noises, in one ear or in both ears. The sounds of tinnitus can be soft, loud, or somewhere in between. Tinnitus can last for a few seconds or can be constant for days. It may go away without treatment and come back at various times. When tinnitus is constant or happens often, it can lead to other problems, such as trouble sleeping and trouble concentrating. Almost everyone experiences tinnitus at some point. Tinnitus is not the same as hearing loss. Tinnitus that is long-lasting (chronic) or comes back often (recurs) may require medical attention. What are the causes? The cause of tinnitus is often not known. In some cases, it can result from: Exposure to loud noises from machinery, music, or other sources. An object (foreign body) stuck in the ear. Earwax buildup. Drinking alcohol or caffeine. Taking certain medicines. Age-related hearing loss. It may also be caused by medical conditions such as: Ear or sinus infections. Heart diseases or high blood pressure. Allergies. Mnire's disease. Thyroid problems. Tumors. A weak, bulging blood vessel (aneurysm) near the ear. What increases the risk? The following factors may make you more likely to develop this condition: Exposure to loud noises. Age. Tinnitus is more likely in older individuals. Using alcohol or tobacco. What are the signs or symptoms? The main symptom of tinnitus is hearing a sound when there is no source for that sound. It may sound like: Buzzing. Sizzling. Ringing. Blowing air. Hissing. Whistling. Other sounds may include: Roaring. Running water. A musical note. Tapping. Humming. Symptoms may affect only one ear (unilateral) or both ears (bilateral). How is this diagnosed? Tinnitus is diagnosed based on your symptoms,  your medical history, and a physical exam. Your health care provider may do a thorough hearing test (audiologic exam) if your tinnitus: Is unilateral. Causes hearing difficulties. Lasts 6 months or longer. You may work with a health care provider who specializes in hearing disorders (audiologist). You may be asked questions about your symptoms and how they affect your daily life. You may have other tests done, such as: CT scan. MRI. An imaging test of how blood flows through your blood vessels (angiogram). How is this treated? Treating an underlying medical condition can sometimes make tinnitus go away. If your tinnitus continues, other treatments may include: Therapy and counseling to help you manage the stress of living with tinnitus. Sound generators to mask the tinnitus. These include: Tabletop sound machines that play relaxing sounds to help you fall asleep. Wearable devices that fit in your ear and play sounds or music. Acoustic neural stimulation. This involves using headphones to listen to music that contains an auditory signal. Over time, listening to this signal may change some pathways in your brain and make you less sensitive to tinnitus. This treatment is used for very severe cases when no other treatment is working. Using hearing aids or cochlear implants if your tinnitus is related to hearing loss. Hearing aids are worn in the outer ear. Cochlear implants are surgically placed in the inner ear. Follow these instructions at home: Managing symptoms   When possible, avoid being in loud places and being exposed to loud sounds. Wear hearing protection, such as earplugs, when you are exposed to loud noises. Use a white noise machine, a humidifier, or other devices to mask the sound of tinnitus. Practice techniques  for reducing stress, such as meditation, yoga, or deep breathing. Work with your health care provider if you need help with managing stress. Sleep with your head slightly  raised. This may reduce the impact of tinnitus. General instructions Do not use stimulants, such as nicotine, alcohol, or caffeine. Talk with your health care provider about other stimulants to avoid. Stimulants are substances that can make you feel alert and attentive by increasing certain activities in the body (such as heart rate and blood pressure). These substances may make tinnitus worse. Take over-the-counter and prescription medicines only as told by your health care provider. Try to get plenty of sleep each night. Keep all follow-up visits. This is important. Contact a health care provider if: Your tinnitus continues for 3 weeks or longer without stopping. You develop sudden hearing loss. Your symptoms get worse or do not get better with home care. You feel you are not able to manage the stress of living with tinnitus. Get help right away if: You develop tinnitus after a head injury. You have tinnitus along with any of the following: Dizziness. Nausea and vomiting. Loss of balance. Sudden, severe headache. Vision changes. Facial weakness or weakness of arms or legs. These symptoms may represent a serious problem that is an emergency. Do not wait to see if the symptoms will go away. Get medical help right away. Call your local emergency services (911 in the [place] Do not drive yourself to the hospital. Summary Tinnitus refers to hearing a sound when there is no actual source for that sound. This is often described as ringing in the ears. Symptoms may affect only one ear (unilateral) or both ears (bilateral). Use a white noise machine, a humidifier, or other devices to mask the sound of tinnitus. Do not use stimulants, such as nicotine, alcohol, or caffeine. These substances may make tinnitus worse. This information is not intended to replace advice given to you by your health care provider. Make sure you discuss any questions you have with your health care provider. Document  Revised: 01/20/2020 Document Reviewed: 01/20/2020 Elsevier Patient Education  2022 [employer].

## 2021-01-13 LAB — CMP14+EGFR
ALT: 11 IU/L (ref 0–32)
AST: 15 IU/L (ref 0–40)
Albumin/Globulin Ratio: 1.8 (ref 1.2–2.2)
Albumin: 4.3 g/dL (ref 3.7–4.7)
Alkaline Phosphatase: 49 IU/L (ref 44–121)
BUN/Creatinine Ratio: 22 (ref 12–28)
BUN: 22 mg/dL (ref 8–27)
Bilirubin Total: 0.4 mg/dL (ref 0.0–1.2)
CO2: 26 mmol/L (ref 20–29)
Calcium: 9.5 mg/dL (ref 8.7–10.3)
Chloride: 101 mmol/L (ref 96–106)
Creatinine, Ser: 0.98 mg/dL (ref 0.57–1.00)
Globulin, Total: 2.4 g/dL (ref 1.5–4.5)
Glucose: 132 mg/dL — ABNORMAL HIGH (ref 70–99)
Potassium: 4.2 mmol/L (ref 3.5–5.2)
Sodium: 140 mmol/L (ref 134–144)
Total Protein: 6.7 g/dL (ref 6.0–8.5)
eGFR: 59 mL/min/{1.73_m2} — ABNORMAL LOW (ref >59)

## 2021-01-13 LAB — MICROALBUMIN / CREATININE URINE RATIO
Creatinine, Urine: 133.6 mg/dL
Microalb/Creat Ratio: 6 mg/g creat (ref 0–29)
Microalbumin, Urine: 7.8 ug/mL

## 2021-01-14 ENCOUNTER — Other Ambulatory Visit: Payer: Self-pay | Admitting: Family

## 2021-01-19 ENCOUNTER — Telehealth: Payer: Self-pay | Admitting: Family

## 2021-01-19 ENCOUNTER — Telehealth: Payer: Medicare Other | Admitting: *Deleted

## 2021-01-19 NOTE — Telephone Encounter (Signed)
Denise Gardner from [facility] called to make sure office received PA for repatha electronically. Ref # [id number]

## 2021-01-19 NOTE — Telephone Encounter (Signed)
Patient aware and verbalized understanding. °

## 2021-01-20 ENCOUNTER — Telehealth: Payer: Self-pay | Admitting: Family

## 2021-01-20 NOTE — Telephone Encounter (Signed)
Message from Plan Request Reference Number: [id number]. REPATHA SURE INJ 140MG /ML is approved through 02/27/2022. Your patient may now fill this prescription and it will be covered  [facility] aware

## 2021-01-20 NOTE — Telephone Encounter (Signed)
Rcv'd will start PA

## 2021-01-20 NOTE — Telephone Encounter (Signed)
Questions answered Sent to plan

## 2021-01-20 NOTE — Telephone Encounter (Signed)
DUP encounter - PA sent to pan - waiting on response

## 2021-01-20 NOTE — Telephone Encounter (Signed)
Denise Gardner (Key: [id number]) Rx #: [id number] [place] 140MG /ML auto-injectors   Form [employer] Medicare Part D Electronic Prior Authorization Form (2017 NCPDP)    Wait for Questions [employer] 2017 NCPDP typically responds with questions in less than 15 minutes, but may take up to 24 hours.

## 2021-01-28 ENCOUNTER — Other Ambulatory Visit: Payer: Self-pay | Admitting: Family

## 2021-01-29 ENCOUNTER — Other Ambulatory Visit: Payer: Self-pay | Admitting: Family

## 2021-01-29 DIAGNOSIS — J309 Allergic rhinitis, unspecified: Secondary | ICD-10-CM

## 2021-01-29 DIAGNOSIS — F32A Depression, unspecified: Secondary | ICD-10-CM

## 2021-01-29 DIAGNOSIS — F419 Anxiety disorder, unspecified: Secondary | ICD-10-CM

## 2021-01-29 DIAGNOSIS — I152 Hypertension secondary to endocrine disorders: Secondary | ICD-10-CM

## 2021-01-29 DIAGNOSIS — E1159 Type 2 diabetes mellitus with other circulatory complications: Secondary | ICD-10-CM

## 2021-01-29 NOTE — Telephone Encounter (Signed)
Patient does take she had just been out for a while

## 2021-02-01 ENCOUNTER — Ambulatory Visit (INDEPENDENT_AMBULATORY_CARE_PROVIDER_SITE_OTHER): Payer: Medicare Other | Admitting: Licensed Clinical Social Worker

## 2021-02-01 DIAGNOSIS — F331 Major depressive disorder, recurrent, moderate: Secondary | ICD-10-CM

## 2021-02-01 DIAGNOSIS — E1142 Type 2 diabetes mellitus with diabetic polyneuropathy: Secondary | ICD-10-CM

## 2021-02-01 DIAGNOSIS — I2510 Atherosclerotic heart disease of native coronary artery without angina pectoris: Secondary | ICD-10-CM

## 2021-02-01 DIAGNOSIS — I152 Hypertension secondary to endocrine disorders: Secondary | ICD-10-CM

## 2021-02-01 DIAGNOSIS — J449 Chronic obstructive pulmonary disease, unspecified: Secondary | ICD-10-CM

## 2021-02-01 DIAGNOSIS — E1159 Type 2 diabetes mellitus with other circulatory complications: Secondary | ICD-10-CM

## 2021-02-01 DIAGNOSIS — E1169 Type 2 diabetes mellitus with other specified complication: Secondary | ICD-10-CM

## 2021-02-01 NOTE — Patient Instructions (Addendum)
Visit Information  Patient Goals:  Protect My Health . Complete ADLs.  Manage Depression issues  Timeframe:  Short-Term Goal Priority:  High Progress:  On Track Start Date:    02/01/21               Expected End Date:  04/29/21                     Follow Up Date      03/31/21 at 4:00 PM      Protect My Health: Complete ADLs. Manage Depression issues    Why is this important?   Screening tests can find diseases early when they are easier to treat.  Your doctor or nurse will talk with you about which tests are important for you.  Getting shots for common diseases like the flu and shingles will help prevent them.     Patient Coping Skills Has support from her brother Attends scheduled medical appointments  Patient Deficits: Memory issues Financial challenges Some walking challenges  Patient Goals: In  next 30 days, patient will: Attend scheduled medical appointments Call RNCM as needed to discuss nursing needs Communicate with LCSW as needed to discuss social work needs -  Follow Up Plan: LCSW to call client on 03/31/21 at 4:00 PM to assess client needs   Norva Riffle.Dayyan Krist MSW, LCSW Licensed Clinical Social Worker [facility] Care Management [phone]

## 2021-02-01 NOTE — Chronic Care Management (AMB) (Signed)
Chronic Care Management    Clinical Social Work Note  02/01/2021 Name: Denise Gardner MRN: [id number] DOB: 1945-07-07  Denise Gardner is a 75 y.o. year old female who is a primary care patient of Sharion Balloon, FNP. The CCM team was consulted to assist the patient with chronic disease management and/or care coordination needs related to: [employer] .   Engaged with patient by telephone for follow up visit in response to provider referral for social work chronic care management and care coordination services.   Consent to Services:  The patient was given information about Chronic Care Management services, agreed to services, and gave verbal consent prior to initiation of services.  Please see initial visit note for detailed documentation.   Patient agreed to services and consent obtained.   Assessment: Review of patient past medical history, allergies, medications, and health status, including review of relevant consultants reports was performed today as part of a comprehensive evaluation and provision of chronic care management and care coordination services.     SDOH (Social Determinants of Health) assessments and interventions performed:  SDOH Interventions    Flowsheet Row Most Recent Value  SDOH Interventions   Physical Activity Interventions Other (Comments)  [walking challenges. uses a cane to help her walk.takes rest breaks when walking]  Stress Interventions Provide Counseling  [client has stress related to managing medical needs.]  Depression Interventions/Treatment  Medication        Advanced Directives Status: See Vynca application for related entries.  CCM Care Plan  Allergies  Allergen Reactions   Milk-Related Compounds Itching and Nausea And Vomiting   Eggs Or Egg-Derived Products Itching, Nausea And Vomiting and Other (See Comments)    headaches   Lac Bovis Other (See Comments)    Per PCP office   Other Other (See Comments)    Other reaction(s): Other  (See Comments) Per [facility] Other reaction(s): Other (See Comments) Per [facility] Per [facility]   Statins    Crestor [Rosuvastatin]     Myalgias    Ezetimibe Other (See Comments)    mylagia Per PCP office    Outpatient Encounter Medications as of 02/01/2021  Medication Sig   Accu-Chek FastClix Lancets MISC CHECK BLOOD SUGAR UP TO FOUR TIMES DAILYDx E11.9   acetaminophen (TYLENOL) 500 MG tablet Take 500 mg by mouth 2 (two) times daily. Pt takes 2 tablets at bedtime   amLODipine (NORVASC) 10 MG tablet TAKE 1 TABLET BY MOUTH DAILY   Ascorbic Acid (VITAMIN C) 100 MG tablet Take 100 mg by mouth daily.   aspirin 81 MG chewable tablet Chew 81 mg by mouth daily.   Biotin 5000 MCG TABS Take by mouth.   blood glucose meter kit and supplies Dispense based on patient and insurance preference. Use up to four times daily as directed. E11. Whatever insurance will cover   cholecalciferol (VITAMIN D) 400 UNITS TABS Take by mouth. VITAMIN D3 daily    cyanocobalamin 100 MCG tablet Take 100 mcg by mouth daily.   diazepam (VALIUM) 5 MG tablet Take 1 tablet (5 mg total) by mouth 2 (two) times daily. Pt takes one tablet at daytime and one tablet at bedtime   empagliflozin (JARDIANCE) 25 MG TABS tablet Take 1 tablet (25 mg total) by mouth daily before breakfast.   escitalopram (LEXAPRO) 10 MG tablet TAKE 1 TABLET(10 MG) BY MOUTH DAILY   FLOVENT HFA 110 MCG/ACT inhaler INHALE 2 PUFFS INTO THE LUNGS TWICE DAILY   fluticasone (FLONASE)  50 MCG/ACT nasal spray SHAKE LIQUID AND USE 2 SPRAYS IN EACH NOSTRIL DAILY   gabapentin (NEURONTIN) 300 MG capsule Take 300 mg in AM and 900 mg at bedtime   Garlic 10 MG CAPS Take by mouth.   glucose blood (ACCU-CHEK GUIDE) test strip CHECK BLOOD SUGAR UP TO FOUR TIMES DAILY Dx E11.69   hydrOXYzine (VISTARIL) 25 MG capsule TAKE 1 CAPSULE(25 MG) BY MOUTH THREE TIMES DAILY AS NEEDED   icosapent Ethyl (VASCEPA) 1 g capsule TAKE 4 CAPSULES BY MOUTH EVERY DAY    ipratropium-albuterol (DUONEB) 0.5-2.5 (3) MG/3ML SOLN USE 1 VIAL VIA NEBULIZER EVERY 6 HOURS   isosorbide mononitrate (IMDUR) 120 MG 24 hr tablet TAKE 1 TABLET(120 MG) BY MOUTH DAILY   levothyroxine (SYNTHROID) 100 MCG tablet TAKE 1 TABLET(100 MCG) BY MOUTH DAILY   linaclotide (LINZESS) 72 MCG capsule Take 1 capsule (72 mcg total) by mouth daily before breakfast.   meloxicam (MOBIC) 7.5 MG tablet Take 1 tablet (7.5 mg total) by mouth daily.   metFORMIN (GLUCOPHAGE-XR) 500 MG 24 hr tablet TAKE 2 TABLETS(1000 MG) BY MOUTH DAILY WITH BREAKFAST   metoprolol tartrate (LOPRESSOR) 25 MG tablet TAKE 1 TABLET(25 MG) BY MOUTH TWICE DAILY   misoprostol (CYTOTEC) 200 MCG tablet Take 200 mcg by mouth 4 (four) times daily.   montelukast (SINGULAIR) 10 MG tablet TAKE 1 TABLET(10 MG) BY MOUTH AT BEDTIME   Multiple Vitamins-Minerals (HAIR SKIN AND NAILS FORMULA PO) Take 1 tablet by mouth daily.   nitroGLYCERIN (NITROSTAT) 0.4 MG SL tablet DISSOLVE 1 TABLET UNDER THE TONGUE EVERY 5 MINUTES AS  NEEDED FOR CHEST PAIN. MAX  OF 3 TAB 15 MINUTES. CALL 911 IF PAIN PERSISTS.   omeprazole (PRILOSEC) 20 MG capsule TAKE 1 CAPSULE BY MOUTH TWICE DAILY BEFORE A MEAL   raloxifene (EVISTA) 60 MG tablet TAKE 1 TABLET(60 MG) BY MOUTH DAILY   REPATHA SURECLICK 595 MG/ML SOAJ ADMINISTER 1 ML UNDER THE SKIN EVERY 14 DAYS   sitaGLIPtin (JANUVIA) 100 MG tablet TAKE 1 TABLET BY MOUTH EVERY DAY   solifenacin (VESICARE) 10 MG tablet TAKE 1 TABLET(10 MG) BY MOUTH DAILY   zinc gluconate 50 MG tablet Take 50 mg by mouth daily.   No facility-administered encounter medications on file as of 02/01/2021.    Patient Active Problem List   Diagnosis Date Noted   Precordial chest pain 03/03/2020   Controlled substance agreement signed 08/16/2018   Myalgia 11/24/2017   Iron deficiency anemia 05/26/2017   Vitamin B 12 deficiency 05/26/2017   Obesity (BMI 30-39.9) 05/25/2017   Constipation 05/19/2016   Hypokalemia 04/30/2015   Depression  07/18/2014   Diabetic neuropathy ([place]) 04/17/2014   OAB (overactive bladder) 04/17/2014   Peripheral neuropathy 04/17/2014   Hypothyroidism 12/27/2012   Osteopenia 05/30/2012   Type 2 diabetes mellitus ([place]) 05/21/2012   Asthma 05/21/2012   Chronic obstructive pulmonary disease ([place]) 05/21/2012   Gastroesophageal reflux disease 05/21/2012   Hyperlipidemia associated with type 2 diabetes mellitus ([place]) 01/26/2011   Hypertension associated with diabetes ([place])    Anxiety    Chronic coronary artery disease     Conditions to be addressed/monitored: monitor client management of anxiety issues and of depression issues  Care Plan : LCSW Care Plan  Updates made by Katha Cabal, LCSW since 02/01/2021 12:00 AM     Problem: Emotional Distress      Goal: Emotional Health Supported. Manage Depression issues. Manage Anxiety issues   Start Date: 02/01/2021  Expected End Date: 04/29/2021  This  Visit's Progress: On track  Priority: High  Note:   Current Barriers:  Chronic Mental Health needs related to depression and anxiety management Mobility issues Memory issues Suicidal Ideation/Homicidal Ideation: No  Clinical Social Work Goal(s):  patient will work with SW monthly by telephone or in person to reduce or manage symptoms related to depression and anxiety issues Patient to communicate as needed with RNCM in next 30 days to discuss nursing needs of client Client to communicate with LCSW monthly  to discuss memory issues of client and to discuss mobility issues of client  Interventions: 1:1 collaboration with Sharion Balloon, FNP regarding development and update of comprehensive plan of care as evidenced by provider attestation and co-signature Discussed sleeping issues of client. Clarity said she has difficulty sleeping. She said she wakes up about 4-5 times nightly to go to bathroom She spoke of urinary incontinence Discussed family support. Client said she has reduced family support  at this time Reviewed edema issues. She said she has edema in her ankles.   Reviewed ear issues. She said she has ringing in her ears. She said ringing in her ears has been present for about 2 months. Discussed appetite. She said she has reduced appetite.   Discussed mood status of client. She said she does get sad occasionally. She has her prescribed medication.  She said she may not always take her medication as prescribed. She said , for example, that she has prescribed Valium. But, she said she does not always take Valium as prescribed Discussed ambulation of client.  She said she uses a cane to help her walk Reviewed energy level. She said she has reduced energy, often feel fatigued Reviewed memory issues. She said she has memory problems. She does write down her appointments on her calendar. But she said she generally has memory difficulty. Client spoke of taking medication for an ulcer.  However, she said she is now out of medication for ulcer. Provided counseling support for client Reviewed housing needs of client.  Client said she had previously lived in mobile home. However, she said her mobile home has mold in it at this time.  She also said mobile home needs costly repairs. She said she would not be able to afford to repair mobile home.  She said she is residing with her ex husband at present. She said she has a small room in which to sleeping, has a bathroom, has access to a kitchen to use. Collaborated with RNCM today about current client needs.   Patient Self Care Activities:  Tries to attend medical appointments Receives Extra Help Medication benefit  Patient  Self Care Deficits:  Memory issues Mobility issues Housing challenges Financial challenges  Patient Goals:  In next 30 days, patient will: Attend scheduled medical appointments Communicate as needed with RNCM to discuss nursing needs of client Communicate as needed with LCSW to discuss social work needs of  client  Follow Up Plan: LCSW to call client on 03/31/21 at 4:00 PM to discuss client needs     Norva Riffle.Emmalyn Hinson MSW, LCSW Licensed Clinical Social Worker [facility] Care Management [phone]

## 2021-02-02 ENCOUNTER — Other Ambulatory Visit: Payer: Self-pay | Admitting: Family

## 2021-02-02 ENCOUNTER — Telehealth: Payer: Self-pay | Admitting: Family

## 2021-02-02 DIAGNOSIS — E1142 Type 2 diabetes mellitus with diabetic polyneuropathy: Secondary | ICD-10-CM

## 2021-02-02 DIAGNOSIS — E1149 Type 2 diabetes mellitus with other diabetic neurological complication: Secondary | ICD-10-CM

## 2021-02-02 MED ORDER — SITAGLIPTIN PHOSPHATE 100 MG PO TABS: ORAL_TABLET | ORAL | 0 refills | Status: DC

## 2021-02-02 MED ORDER — MISOPROSTOL 200 MCG PO TABS: 200.0000 ug | ORAL_TABLET | Freq: Four times a day (QID) | ORAL | 1 refills | Status: DC

## 2021-02-02 NOTE — Telephone Encounter (Signed)
  Prescription Request  02/02/2021  Is this a "Controlled Substance" medicine? mo  Have you seen your PCP in the last 2 weeks? No pt seen 01/12/2021  If YES, route message to pool  -  If NO, patient needs to be scheduled for appointment.  What is the name of the medication or equipment? misoprostol (CYTOTEC) 200 MCG tablet  pt states that something similar to his medication would be sent in for ulcers  Have you contacted your pharmacy to request a refill? yes   Which pharmacy would you like this sent to? [facility] [place]    Patient notified that their request is being sent to the clinical staff for review and that they should receive a response within 2 business days.

## 2021-02-02 NOTE — Telephone Encounter (Signed)
  Prescription Request  02/02/2021  Is this a "Controlled Substance" medicine? no  Have you seen your PCP in the last 2 weeks?no  If YES, route message to pool  -  If NO, patient needs to be scheduled for appointment.  What is the name of the medication or equipment? sitaGLIPtin (JANUVIA) 100 MG tablet [[id number]]   Have you contacted your pharmacy to request a refill? yes   Which pharmacy would you like this sent to?[facility] [place]   Patient notified that their request is being sent to the clinical staff for review and that they should receive a response within 2 business days.

## 2021-02-02 NOTE — Telephone Encounter (Signed)
Prescription sent to pharmacy.

## 2021-02-04 ENCOUNTER — Telehealth: Payer: Medicare Other | Admitting: *Deleted

## 2021-02-04 ENCOUNTER — Telehealth: Payer: Self-pay | Admitting: *Deleted

## 2021-02-04 NOTE — Telephone Encounter (Signed)
  Care Management   Follow Up Note   02/04/2021 Name: Denise Gardner MRN: [id number] DOB: 1945-04-18   Referred by: Sharion Balloon, FNP Reason for referral : Chronic Care Management (Unsuccessful telephone follow-up)   An unsuccessful telephone outreach was attempted today. The patient was referred to the case management team for assistance with care management and care coordination.   Follow Up Plan: A HIPPA compliant phone message was left for the patient providing contact information and requesting a return call. Forwarding to [place] for outreach and rescheduling.    Chong Sicilian, BSN, RN-BC Embedded Chronic Care Manager [facility] [place] [facility] Direct Dial: [phone]

## 2021-02-16 ENCOUNTER — Ambulatory Visit: Payer: Medicare Other | Admitting: *Deleted

## 2021-02-16 DIAGNOSIS — E1169 Type 2 diabetes mellitus with other specified complication: Secondary | ICD-10-CM

## 2021-02-16 DIAGNOSIS — E785 Hyperlipidemia, unspecified: Secondary | ICD-10-CM

## 2021-02-16 DIAGNOSIS — J449 Chronic obstructive pulmonary disease, unspecified: Secondary | ICD-10-CM

## 2021-02-16 DIAGNOSIS — I152 Hypertension secondary to endocrine disorders: Secondary | ICD-10-CM

## 2021-02-16 DIAGNOSIS — E1142 Type 2 diabetes mellitus with diabetic polyneuropathy: Secondary | ICD-10-CM

## 2021-02-16 DIAGNOSIS — E1159 Type 2 diabetes mellitus with other circulatory complications: Secondary | ICD-10-CM

## 2021-02-17 ENCOUNTER — Other Ambulatory Visit: Payer: Self-pay | Admitting: Family

## 2021-02-17 DIAGNOSIS — E1142 Type 2 diabetes mellitus with diabetic polyneuropathy: Secondary | ICD-10-CM

## 2021-02-19 ENCOUNTER — Other Ambulatory Visit: Payer: Self-pay | Admitting: Family

## 2021-02-19 DIAGNOSIS — E1142 Type 2 diabetes mellitus with diabetic polyneuropathy: Secondary | ICD-10-CM

## 2021-02-19 NOTE — Patient Instructions (Signed)
Visit Information  Patient Goals/Self-Care Activities: Take medications as prescribed   Attend all scheduled provider appointments Perform all self care activities independently  Call provider office for new concerns or questions  check blood sugar at prescribed times: twice daily and when you have symptoms of low or high blood sugar check feet daily for cuts, sores or redness read food labels for fat, fiber, carbohydrates and portion size - limit outdoor activity during cold weather - do breathing exercises every day check blood pressure 3 times per week keep a blood pressure log take blood pressure log to all doctor appointments call doctor for signs and symptoms of high blood pressure Have lipid panel at next PCP office visit Call RN Care Manager as needed [phone]  The patient verbalized understanding of instructions, educational materials, and care plan provided today and declined offer to receive copy of patient instructions, educational materials, and care plan.   Plan:Telephone follow up appointment with care management team member scheduled for:  04/07/21 with RNCM The patient has been provided with contact information for the care management team and has been advised to call with any health related questions or concerns.   Chong Sicilian, BSN, RN-BC Embedded Chronic Care Manager [facility] [place] [facility] Direct Dial: [phone]

## 2021-02-19 NOTE — Chronic Care Management (AMB) (Signed)
Chronic Care Management   CCM RN Visit Note  02/16/2021 Name: Denise Gardner MRN: [id number] DOB: [birth year]  Subjective: Denise Gardner is a 75 y.o. year old female who is a primary care patient of Sharion Balloon, FNP. The care management team was consulted for assistance with disease management and care coordination needs.    Engaged with patient by telephone for follow up visit in response to provider referral for case management and/or care coordination services.   Consent to Services:  The patient was given information about Chronic Care Management services, agreed to services, and gave verbal consent prior to initiation of services.  Please see initial visit note for detailed documentation.   Patient agreed to services and verbal consent obtained.   Assessment: Review of patient past medical history, allergies, medications, health status, including review of consultants reports, laboratory and other test data, was performed as part of comprehensive evaluation and provision of chronic care management services.   SDOH (Social Determinants of Health) assessments and interventions performed:    CCM Care Plan  Allergies  Allergen Reactions   Milk-Related Compounds Itching and Nausea And Vomiting   Eggs Or Egg-Derived Products Itching, Nausea And Vomiting and Other (See Comments)    headaches   Lac Bovis Other (See Comments)    Per PCP office   Other Other (See Comments)    Other reaction(s): Other (See Comments) Per [facility] Other reaction(s): Other (See Comments) Per [facility]   Statins    Crestor [Rosuvastatin]     Myalgias    Ezetimibe Other (See Comments)    mylagia Per PCP office    Outpatient Encounter Medications as of 02/16/2021  Medication Sig   Accu-Chek FastClix Lancets MISC CHECK BLOOD SUGAR UP TO FOUR TIMES DAILYDx E11.9   acetaminophen (TYLENOL) 500 MG tablet Take 500 mg by mouth 2 (two) times daily. Pt takes 2  tablets at bedtime   amLODipine (NORVASC) 10 MG tablet TAKE 1 TABLET BY MOUTH DAILY   Ascorbic Acid (VITAMIN C) 100 MG tablet Take 100 mg by mouth daily.   aspirin 81 MG chewable tablet Chew 81 mg by mouth daily.   Biotin 5000 MCG TABS Take by mouth.   blood glucose meter kit and supplies Dispense based on patient and insurance preference. Use up to four times daily as directed. E11. Whatever insurance will cover   cholecalciferol (VITAMIN D) 400 UNITS TABS Take by mouth. VITAMIN D3 daily    cyanocobalamin 100 MCG tablet Take 100 mcg by mouth daily.   diazepam (VALIUM) 5 MG tablet Take 1 tablet (5 mg total) by mouth 2 (two) times daily. Pt takes one tablet at daytime and one tablet at bedtime   empagliflozin (JARDIANCE) 25 MG TABS tablet Take 1 tablet (25 mg total) by mouth daily before breakfast.   escitalopram (LEXAPRO) 10 MG tablet TAKE 1 TABLET(10 MG) BY MOUTH DAILY   FLOVENT HFA 110 MCG/ACT inhaler INHALE 2 PUFFS INTO THE LUNGS TWICE DAILY   fluticasone (FLONASE) 50 MCG/ACT nasal spray SHAKE LIQUID AND USE 2 SPRAYS IN EACH NOSTRIL DAILY   gabapentin (NEURONTIN) 300 MG capsule Take 300 mg in AM and 900 mg at bedtime   Garlic 10 MG CAPS Take by mouth.   glucose blood (ACCU-CHEK GUIDE) test strip CHECK BLOOD SUGAR UP TO FOUR TIMES DAILY Dx E11.69   hydrOXYzine (VISTARIL) 25 MG capsule TAKE 1 CAPSULE(25 MG) BY MOUTH THREE TIMES DAILY AS NEEDED   icosapent Ethyl (  VASCEPA) 1 g capsule TAKE 4 CAPSULES BY MOUTH EVERY DAY   ipratropium-albuterol (DUONEB) 0.5-2.5 (3) MG/3ML SOLN USE 1 VIAL VIA NEBULIZER EVERY 6 HOURS   isosorbide mononitrate (IMDUR) 120 MG 24 hr tablet TAKE 1 TABLET(120 MG) BY MOUTH DAILY   levothyroxine (SYNTHROID) 100 MCG tablet TAKE 1 TABLET(100 MCG) BY MOUTH DAILY   linaclotide (LINZESS) 72 MCG capsule Take 1 capsule (72 mcg total) by mouth daily before breakfast.   meloxicam (MOBIC) 7.5 MG tablet Take 1 tablet (7.5 mg total) by mouth daily.   metoprolol tartrate (LOPRESSOR) 25  MG tablet TAKE 1 TABLET(25 MG) BY MOUTH TWICE DAILY   misoprostol (CYTOTEC) 200 MCG tablet Take 1 tablet (200 mcg total) by mouth 4 (four) times daily.   montelukast (SINGULAIR) 10 MG tablet TAKE 1 TABLET(10 MG) BY MOUTH AT BEDTIME   Multiple Vitamins-Minerals (HAIR SKIN AND NAILS FORMULA PO) Take 1 tablet by mouth daily.   nitroGLYCERIN (NITROSTAT) 0.4 MG SL tablet DISSOLVE 1 TABLET UNDER THE TONGUE EVERY 5 MINUTES AS  NEEDED FOR CHEST PAIN. MAX  OF 3 TAB 15 MINUTES. CALL 911 IF PAIN PERSISTS.   omeprazole (PRILOSEC) 20 MG capsule TAKE 1 CAPSULE BY MOUTH TWICE DAILY BEFORE A MEAL   raloxifene (EVISTA) 60 MG tablet TAKE 1 TABLET(60 MG) BY MOUTH DAILY   REPATHA SURECLICK 034 MG/ML SOAJ ADMINISTER 1 ML UNDER THE SKIN EVERY 14 DAYS   sitaGLIPtin (JANUVIA) 100 MG tablet TAKE 1 TABLET BY MOUTH EVERY DAY   solifenacin (VESICARE) 10 MG tablet TAKE 1 TABLET(10 MG) BY MOUTH DAILY   zinc gluconate 50 MG tablet Take 50 mg by mouth daily.   [DISCONTINUED] metFORMIN (GLUCOPHAGE-XR) 500 MG 24 hr tablet TAKE 2 TABLETS(1000 MG) BY MOUTH DAILY WITH BREAKFAST   No facility-administered encounter medications on file as of 02/16/2021.    Patient Active Problem List   Diagnosis Date Noted   Precordial chest pain 03/03/2020   Controlled substance agreement signed 08/16/2018   Myalgia 11/24/2017   Iron deficiency anemia 05/26/2017   Vitamin B 12 deficiency 05/26/2017   Obesity (BMI 30-39.9) 05/25/2017   Constipation 05/19/2016   Hypokalemia 04/30/2015   Depression 07/18/2014   Diabetic neuropathy ([place]) 04/17/2014   OAB (overactive bladder) 04/17/2014   Peripheral neuropathy 04/17/2014   Hypothyroidism 12/27/2012   Osteopenia 05/30/2012   Type 2 diabetes mellitus ([place]) 05/21/2012   Asthma 05/21/2012   Chronic obstructive pulmonary disease ([place]) 05/21/2012   Gastroesophageal reflux disease 05/21/2012   Hyperlipidemia associated with type 2 diabetes mellitus ([place]) 01/26/2011   Hypertension associated with  diabetes ([place])    Anxiety    Chronic coronary artery disease     Conditions to be addressed/monitored:HTN, HLD, COPD, and DMII  Care Plan : [facility] Care Plan  Updates made by Ilean China, RN since 02/19/2021 12:00 AM     Problem: Chroic Disease Management Needs   Priority: High  Onset Date: 02/16/2021     Goal: Patient will Work with RN Care Manager Regarding [place] with DM, HTN, HLD, COPD, and Depression   Start Date: 02/16/2021  Expected End Date: 02/16/2022  This Visit's Progress: On track  Priority: High  Note:   Current Barriers:  Chronic Disease Management support and education needs related to HTN, HLD, COPD, DMII, and depression  RNCM Clinical Goal(s):  Patient will continue to work with RN Care Manager and/or Social Worker to address care management and care coordination needs related to HTN, HLD, COPD, DMII, and  Depression as evidenced by adherence to CM Team Scheduled appointments     through collaboration with RN Care manager, provider, and care team.   Interventions: 1:1 collaboration with primary care provider regarding development and update of comprehensive plan of care as evidenced by provider attestation and co-signature Inter-disciplinary care team collaboration (see longitudinal plan of care) Evaluation of current treatment plan related to  self management and patient's adherence to plan as established by provider   COPD: (Status: Goal on Track (progressing): YES.) Long Term Goal  Reviewed medications with patient, including use of prescribed maintenance and rescue inhalers, and provided instruction on medication management and the importance of adherence Provided patient with basic written and verbal COPD education on self care/management/and exacerbation prevention Advised patient to track and manage COPD triggers Provided written and verbal instructions on pursed lip breathing and utilized returned demonstration  as teach back Provided instruction about proper use of medications used for management of COPD including inhalers Advised patient to engage in light exercise as tolerated 3-5 days a week to aid in the the management of COPD Discussed the importance of adequate rest and management of fatigue with COPD Assessed social determinant of health barriers  Diabetes:  (Status: Goal on Track (progressing): YES.) Long Term Goal   Lab Results  Component Value Date   HGBA1C 6.4 (H) 01/12/2021  Assessed patient's understanding of A1c goal: <7% Reviewed and discussed medications No problems with cost because she has Medicare Extra Help  Blood sugars have improved some since starting Jardiance but are still not as good as they were before starting Repatha Chart reviewed including relevant office notes and lab results Discussed home blood sugar testing Testing twice a day and as needed A few readings over 300  No readings below 70 Encouraged patient to continue checking blood sugar twice a day and PRN and to write the results down in a log Advised to call PCP with any readings over 300 or with several readings in a row over 200 Advised to bring log to PCP appointment for review Reinforced carb modified diet and verbal education provided on what that is Encouraged to continue getting outside daily for a walk when the weather permits Provided with RNCM contact number and encouraged to reach out as needed Reviewed upcoming appointment with PCP and cancer center  Hyperlipidemia:  (Status: Goal on track: NO.) Long Term Goal  Lab Results  Component Value Date   CHOL 267 (H) 03/19/2019   HDL 58 03/19/2019   LDLCALC 189 (H) 03/19/2019   TRIG 112 03/19/2019   CHOLHDL 4.6 (H) 03/19/2019    Mediations reviewed and importance of compliance discussed Using Repatha injection for HLD control due to statin intolerance  Provider established cholesterol goals reviewed; Counseled on importance of regular  laboratory monitoring as prescribed; Reviewed exercise goals and target of 150 minutes per week; Assessed social determinant of health barriers;  Advised that lipid panel is overdue  Appt notes added to next PCP appointment that lipid panel is needed  Hypertension: (Status: Goal on Track (progressing): YES.) Last practice recorded BP readings:  BP Readings from Last 3 Encounters:  01/12/21 (!) 143/82  12/10/20 (!) 142/63  10/22/20 139/76  Most recent eGFR/CrCl:  Lab Results  Component Value Date   EGFR 59 (L) 01/12/2021    No components found for: CRCL  Evaluation of current treatment plan related to hypertension self management and patient's adherence to plan as established by provider;   Reviewed medications with patient and discussed  importance of compliance;  Counseled on the importance of exercise goals with target of 150 minutes per week Discussed plans with patient for ongoing care management follow up and provided patient with direct contact information for care management team; Discussed complications of poorly controlled blood pressure such as heart disease, stroke, circulatory complications, vision complications, kidney impairment, sexual dysfunction;  Assessed social determinant of health barriers;   Patient Goals/Self-Care Activities: Take medications as prescribed   Attend all scheduled provider appointments Perform all self care activities independently  Call provider office for new concerns or questions  check blood sugar at prescribed times: twice daily and when you have symptoms of low or high blood sugar check feet daily for cuts, sores or redness read food labels for fat, fiber, carbohydrates and portion size - limit outdoor activity during cold weather - do breathing exercises every day check blood pressure 3 times per week keep a blood pressure log take blood pressure log to all doctor appointments call doctor for signs and symptoms of high blood  pressure Have lipid panel at next PCP office visit Call RN Care Manager as needed [phone]  Plan:Telephone follow up appointment with care management team member scheduled for:  04/07/21 with RNCM The patient has been provided with contact information for the care management team and has been advised to call with any health related questions or concerns.   Chong Sicilian, BSN, RN-BC Embedded Chronic Care Manager [facility] [place] [facility] Direct Dial: [phone]

## 2021-02-24 ENCOUNTER — Ambulatory Visit
Admission: RE | Admit: 2021-02-24 | Discharge: 2021-02-24 | Disposition: A | Discharge status: Home / Self Care | Payer: Medicare Other | Source: Ambulatory Visit | Attending: Family | Admitting: Family

## 2021-02-24 DIAGNOSIS — Z1231 Encounter for screening mammogram for malignant neoplasm of breast: Secondary | ICD-10-CM | POA: Diagnosis not present

## 2021-03-04 ENCOUNTER — Ambulatory Visit (INDEPENDENT_AMBULATORY_CARE_PROVIDER_SITE_OTHER): Payer: Medicare Other | Admitting: Licensed Clinical Social Worker

## 2021-03-04 ENCOUNTER — Other Ambulatory Visit: Payer: Self-pay | Admitting: Family

## 2021-03-04 DIAGNOSIS — E1142 Type 2 diabetes mellitus with diabetic polyneuropathy: Secondary | ICD-10-CM

## 2021-03-04 DIAGNOSIS — I2510 Atherosclerotic heart disease of native coronary artery without angina pectoris: Secondary | ICD-10-CM

## 2021-03-04 DIAGNOSIS — I152 Hypertension secondary to endocrine disorders: Secondary | ICD-10-CM

## 2021-03-04 DIAGNOSIS — J449 Chronic obstructive pulmonary disease, unspecified: Secondary | ICD-10-CM

## 2021-03-04 DIAGNOSIS — E1149 Type 2 diabetes mellitus with other diabetic neurological complication: Secondary | ICD-10-CM

## 2021-03-04 DIAGNOSIS — F331 Major depressive disorder, recurrent, moderate: Secondary | ICD-10-CM

## 2021-03-04 DIAGNOSIS — E1159 Type 2 diabetes mellitus with other circulatory complications: Secondary | ICD-10-CM

## 2021-03-04 DIAGNOSIS — E785 Hyperlipidemia, unspecified: Secondary | ICD-10-CM

## 2021-03-04 DIAGNOSIS — E1169 Type 2 diabetes mellitus with other specified complication: Secondary | ICD-10-CM

## 2021-03-04 NOTE — Chronic Care Management (AMB) (Signed)
Chronic Care Management    Clinical Social Work Note  03/04/2021 Name: Denise Gardner MRN: [id number] DOB: 1945-04-01  Denise Gardner is a 76 y.o. year old female who is a primary care patient of Sharion Balloon, FNP. The CCM team was consulted to assist the patient with chronic disease management and/or care coordination needs related to: [employer] .   Engaged with patient by telephone for follow up visit in response to provider referral for social work chronic care management and care coordination services.   Consent to Services:  The patient was given information about Chronic Care Management services, agreed to services, and gave verbal consent prior to initiation of services.  Please see initial visit note for detailed documentation.   Patient agreed to services and consent obtained.   Assessment: Review of patient past medical history, allergies, medications, and health status, including review of relevant consultants reports was performed today as part of a comprehensive evaluation and provision of chronic care management and care coordination services.     SDOH (Social Determinants of Health) assessments and interventions performed:  SDOH Interventions    Flowsheet Row Most Recent Value  SDOH Interventions   Physical Activity Interventions Other (Comments)  [walking challenges,  she uses a cane to help her walk]  Stress Interventions Provide Counseling  [client has stress related to managing medical needs. client has stress related to memory challenges]  Depression Interventions/Treatment  Medication        Advanced Directives Status: See Vynca application for related entries.  CCM Care Plan  Allergies  Allergen Reactions   Milk-Related Compounds Itching and Nausea And Vomiting   Eggs Or Egg-Derived Products Itching, Nausea And Vomiting and Other (See Comments)    headaches   Lac Bovis Other (See Comments)    Per PCP office   Other Other (See Comments)    Other  reaction(s): Other (See Comments) Per [facility] Other reaction(s): Other (See Comments) Per [facility]   Statins    Crestor [Rosuvastatin]     Myalgias    Ezetimibe Other (See Comments)    mylagia Per PCP office    Outpatient Encounter Medications as of 03/04/2021  Medication Sig   Accu-Chek FastClix Lancets MISC CHECK BLOOD SUGAR UP TO FOUR TIMES DAILYDx E11.9   acetaminophen (TYLENOL) 500 MG tablet Take 500 mg by mouth 2 (two) times daily. Pt takes 2 tablets at bedtime   amLODipine (NORVASC) 10 MG tablet TAKE 1 TABLET BY MOUTH DAILY   Ascorbic Acid (VITAMIN C) 100 MG tablet Take 100 mg by mouth daily.   aspirin 81 MG chewable tablet Chew 81 mg by mouth daily.   Biotin 5000 MCG TABS Take by mouth.   blood glucose meter kit and supplies Dispense based on patient and insurance preference. Use up to four times daily as directed. E11. Whatever insurance will cover   cholecalciferol (VITAMIN D) 400 UNITS TABS Take by mouth. VITAMIN D3 daily    cyanocobalamin 100 MCG tablet Take 100 mcg by mouth daily.   diazepam (VALIUM) 5 MG tablet Take 1 tablet (5 mg total) by mouth 2 (two) times daily. Pt takes one tablet at daytime and one tablet at bedtime   empagliflozin (JARDIANCE) 25 MG TABS tablet Take 1 tablet (25 mg total) by mouth daily before breakfast.   escitalopram (LEXAPRO) 10 MG tablet TAKE 1 TABLET(10 MG) BY MOUTH DAILY   FLOVENT HFA 110 MCG/ACT inhaler INHALE 2 PUFFS INTO THE LUNGS TWICE  DAILY   fluticasone (FLONASE) 50 MCG/ACT nasal spray SHAKE LIQUID AND USE 2 SPRAYS IN EACH NOSTRIL DAILY   gabapentin (NEURONTIN) 300 MG capsule Take 300 mg in AM and 900 mg at bedtime   Garlic 10 MG CAPS Take by mouth.   glucose blood (ACCU-CHEK GUIDE) test strip CHECK BLOOD SUGAR UP TO FOUR TIMES DAILY Dx E11.69   hydrOXYzine (VISTARIL) 25 MG capsule TAKE 1 CAPSULE(25 MG) BY MOUTH THREE TIMES DAILY AS NEEDED   icosapent Ethyl (VASCEPA) 1 g capsule TAKE 4 CAPSULES  BY MOUTH EVERY DAY   ipratropium-albuterol (DUONEB) 0.5-2.5 (3) MG/3ML SOLN USE 1 VIAL VIA NEBULIZER EVERY 6 HOURS   isosorbide mononitrate (IMDUR) 120 MG 24 hr tablet TAKE 1 TABLET(120 MG) BY MOUTH DAILY   levothyroxine (SYNTHROID) 100 MCG tablet TAKE 1 TABLET(100 MCG) BY MOUTH DAILY   linaclotide (LINZESS) 72 MCG capsule Take 1 capsule (72 mcg total) by mouth daily before breakfast.   meloxicam (MOBIC) 7.5 MG tablet Take 1 tablet (7.5 mg total) by mouth daily.   metFORMIN (GLUCOPHAGE-XR) 500 MG 24 hr tablet TAKE 2 TABLETS(1000 MG) BY MOUTH DAILY WITH BREAKFAST   metoprolol tartrate (LOPRESSOR) 25 MG tablet TAKE 1 TABLET(25 MG) BY MOUTH TWICE DAILY   misoprostol (CYTOTEC) 200 MCG tablet Take 1 tablet (200 mcg total) by mouth 4 (four) times daily.   montelukast (SINGULAIR) 10 MG tablet TAKE 1 TABLET(10 MG) BY MOUTH AT BEDTIME   Multiple Vitamins-Minerals (HAIR SKIN AND NAILS FORMULA PO) Take 1 tablet by mouth daily.   nitroGLYCERIN (NITROSTAT) 0.4 MG SL tablet DISSOLVE 1 TABLET UNDER THE TONGUE EVERY 5 MINUTES AS  NEEDED FOR CHEST PAIN. MAX  OF 3 TAB 15 MINUTES. CALL 911 IF PAIN PERSISTS.   omeprazole (PRILOSEC) 20 MG capsule TAKE 1 CAPSULE BY MOUTH TWICE DAILY BEFORE A MEAL   raloxifene (EVISTA) 60 MG tablet TAKE 1 TABLET(60 MG) BY MOUTH DAILY   REPATHA SURECLICK 284 MG/ML SOAJ ADMINISTER 1 ML UNDER THE SKIN EVERY 14 DAYS   sitaGLIPtin (JANUVIA) 100 MG tablet TAKE 1 TABLET BY MOUTH EVERY DAY   solifenacin (VESICARE) 10 MG tablet TAKE 1 TABLET(10 MG) BY MOUTH DAILY   zinc gluconate 50 MG tablet Take 50 mg by mouth daily.   No facility-administered encounter medications on file as of 03/04/2021.    Patient Active Problem List   Diagnosis Date Noted   Precordial chest pain 03/03/2020   Controlled substance agreement signed 08/16/2018   Myalgia 11/24/2017   Iron deficiency anemia 05/26/2017   Vitamin B 12 deficiency 05/26/2017   Obesity (BMI 30-39.9) 05/25/2017   Constipation 05/19/2016    Hypokalemia 04/30/2015   Depression 07/18/2014   Diabetic neuropathy ([place]) 04/17/2014   OAB (overactive bladder) 04/17/2014   Peripheral neuropathy 04/17/2014   Hypothyroidism 12/27/2012   Osteopenia 05/30/2012   Type 2 diabetes mellitus ([place]) 05/21/2012   Asthma 05/21/2012   Chronic obstructive pulmonary disease ([place]) 05/21/2012   Gastroesophageal reflux disease 05/21/2012   Hyperlipidemia associated with type 2 diabetes mellitus ([place]) 01/26/2011   Hypertension associated with diabetes ([place])    Anxiety    Chronic coronary artery disease     Conditions to be addressed/monitored: monitor client completion of ADLs. Monitor client management of pain issues and mobility issues  Care Plan : LCSW care plan  Updates made by Katha Cabal, LCSW since 03/04/2021 12:00 AM     Problem: Coping Skills (General Plan of Care)      Goal: Coping Skills Enhanced; Manage ADLs;  Manage Medical Conditions   Start Date: 03/04/2021  Expected End Date: 06/01/2021  This Visit's Progress: On track  Recent Progress: On track  Priority: Medium  Note:   Current barriers:   Patient in need of assistance with connecting to community resources for possible help with ADLs completion Edema issues Memory issues Mobility issues  Clinical Goals:  LCSW to communicate with client in next 30 days to discuss client completion of ADLs and client completion of daily activities LCSW to communicate with client in next 30 days to discuss pain issues of client and mobility issues of client Client to communicate in next 30 days with Dr. Lottie Dawson, [facility] Pharmacist, to discuss medication questions of client  Clinical Interventions:  Collaboration with Sharion Balloon, FNP regarding development and update of comprehensive plan of care as evidenced by provider attestation and co-signature Assessment of needs, barriers of client. Discussed with Larinda her edema issues and pain in her feet.  She said pain in her feet may  keep her from sleeping well at night.  She said she has edema in her legs and ankles. Discussed with client her medication procurement. She said she has her prescribed medications. Reviewed client energy level. She said she is often fatigued and has low energy Reviewed with client her walking challenges. She said she uses a cane to walk.  Reviewed memory issues of client. Client said she is having memory problems.   Encouraged client to call RNCM as needed in next 30 days for CCM nursing support Discussed ringing in ears of client. Client said she still has ringing in her ears and it has been going on for several months Discussed client appetite. She often has reduced appetite.  She said she has a headache today and has not eaten very much today.  She did not mention any nausea issues  Patient Coping Skills Has support from her brother Attends scheduled medical appointments  Patient Deficits: Memory issues Financial challenges Some walking challenges  Patient Goals: In  next 30 days, patient will: Attend scheduled medical appointments Call RNCM as needed to discuss nursing needs Communicate with LCSW as needed to discuss social work needs -  Follow Up Plan: LCSW to call client on 04/28/2021 at 11:00 AM to assess client needs at that time.       Norva Riffle.Kary Sugrue MSW, LCSW Licensed Clinical Social Worker [facility] Care Management [phone]

## 2021-03-04 NOTE — Patient Instructions (Addendum)
Visit Information  Patient Goals:  Protect My Health. Complete ADLs. Manage Depression issues  Timeframe:  Short-Term Goal Priority:  Medium Progress:  On Track Start Date:    03/04/2021                 Expected End Date:  06/01/2021                     Follow Up Date    04/28/2021 at 11:00 AM      Protect My Health: Complete ADLs. Manage Depression issues    Why is this important?   Screening tests can find diseases early when they are easier to treat.  Your doctor or nurse will talk with you about which tests are important for you.  Getting shots for common diseases like the flu and shingles will help prevent them.     Patient Coping Skills Has support from her brother Attends scheduled medical appointments  Patient Deficits: Memory issues Financial challenges Some walking challenges  Patient Goals: In  next 30 days, patient will: Attend scheduled medical appointments Call RNCM as needed to discuss nursing needs Communicate with LCSW as needed to discuss social work needs -  Follow Up Plan: LCSW to call client on 04/28/2021 at 11:00 AM to assess client needs   Norva Riffle.Aasiyah Auerbach MSW, LCSW Licensed Clinical Social Worker [facility] Care Management [phone]

## 2021-03-10 NOTE — Telephone Encounter (Signed)
Will close encounter

## 2021-03-30 DIAGNOSIS — J449 Chronic obstructive pulmonary disease, unspecified: Secondary | ICD-10-CM

## 2021-03-30 DIAGNOSIS — E1169 Type 2 diabetes mellitus with other specified complication: Secondary | ICD-10-CM

## 2021-03-30 DIAGNOSIS — I2510 Atherosclerotic heart disease of native coronary artery without angina pectoris: Secondary | ICD-10-CM

## 2021-03-30 DIAGNOSIS — I152 Hypertension secondary to endocrine disorders: Secondary | ICD-10-CM

## 2021-03-30 DIAGNOSIS — E1142 Type 2 diabetes mellitus with diabetic polyneuropathy: Secondary | ICD-10-CM

## 2021-03-30 DIAGNOSIS — F331 Major depressive disorder, recurrent, moderate: Secondary | ICD-10-CM

## 2021-03-30 DIAGNOSIS — E785 Hyperlipidemia, unspecified: Secondary | ICD-10-CM

## 2021-03-30 DIAGNOSIS — E1159 Type 2 diabetes mellitus with other circulatory complications: Secondary | ICD-10-CM

## 2021-03-31 ENCOUNTER — Ambulatory Visit (INDEPENDENT_AMBULATORY_CARE_PROVIDER_SITE_OTHER): Payer: Medicare Other

## 2021-03-31 DIAGNOSIS — E1142 Type 2 diabetes mellitus with diabetic polyneuropathy: Secondary | ICD-10-CM

## 2021-03-31 DIAGNOSIS — F331 Major depressive disorder, recurrent, moderate: Secondary | ICD-10-CM

## 2021-03-31 DIAGNOSIS — E1159 Type 2 diabetes mellitus with other circulatory complications: Secondary | ICD-10-CM

## 2021-03-31 DIAGNOSIS — I152 Hypertension secondary to endocrine disorders: Secondary | ICD-10-CM

## 2021-03-31 DIAGNOSIS — I2510 Atherosclerotic heart disease of native coronary artery without angina pectoris: Secondary | ICD-10-CM

## 2021-03-31 DIAGNOSIS — E785 Hyperlipidemia, unspecified: Secondary | ICD-10-CM

## 2021-03-31 DIAGNOSIS — J449 Chronic obstructive pulmonary disease, unspecified: Secondary | ICD-10-CM

## 2021-03-31 DIAGNOSIS — E1169 Type 2 diabetes mellitus with other specified complication: Secondary | ICD-10-CM

## 2021-03-31 NOTE — Chronic Care Management (AMB) (Signed)
Chronic Care Management    Clinical Social Work Note  03/31/2021 Name: Denise Gardner MRN: [id number] DOB: March 31, 1945  Denise Gardner is a 76 y.o. year old female who is a primary care patient of Sharion Balloon, FNP. The CCM team was consulted to assist the patient with chronic disease management and/or care coordination needs related to: [employer] .   Engaged with patient by telephone for follow up visit in response to provider referral for social work chronic care management and care coordination services.   Consent to Services:  The patient was given information about Chronic Care Management services, agreed to services, and gave verbal consent prior to initiation of services.  Please see initial visit note for detailed documentation.   Patient agreed to services and consent obtained.   Assessment: Review of patient past medical history, allergies, medications, and health status, including review of relevant consultants reports was performed today as part of a comprehensive evaluation and provision of chronic care management and care coordination services.     SDOH (Social Determinants of Health) assessments and interventions performed:  SDOH Interventions    Flowsheet Row Most Recent Value  SDOH Interventions   Physical Activity Interventions Other (Comments)  [walking challenges. she uses a cane to help her walk]  Stress Interventions Provide Counseling  [client has stress related to managing medical needs]  Depression Interventions/Treatment  Medication        Advanced Directives Status: See Vynca application for related entries.  CCM Care Plan  Allergies  Allergen Reactions   Milk-Related Compounds Itching and Nausea And Vomiting   Eggs Or Egg-Derived Products Itching, Nausea And Vomiting and Other (See Comments)    headaches   Lac Bovis Other (See Comments)    Per PCP office   Other Other (See Comments)    Other reaction(s): Other (See Comments) Per [facility]  Mail Order Other reaction(s): Other (See Comments) Per [employer] [facility] Per [facility]   Statins    Crestor [Rosuvastatin]     Myalgias    Ezetimibe Other (See Comments)    mylagia Per PCP office    Outpatient Encounter Medications as of 03/31/2021  Medication Sig   Accu-Chek FastClix Lancets MISC CHECK BLOOD SUGAR UP TO FOUR TIMES DAILYDx E11.9   acetaminophen (TYLENOL) 500 MG tablet Take 500 mg by mouth 2 (two) times daily. Pt takes 2 tablets at bedtime   amLODipine (NORVASC) 10 MG tablet TAKE 1 TABLET BY MOUTH DAILY   Ascorbic Acid (VITAMIN C) 100 MG tablet Take 100 mg by mouth daily.   aspirin 81 MG chewable tablet Chew 81 mg by mouth daily.   Biotin 5000 MCG TABS Take by mouth.   blood glucose meter kit and supplies Dispense based on patient and insurance preference. Use up to four times daily as directed. E11. Whatever insurance will cover   cholecalciferol (VITAMIN D) 400 UNITS TABS Take by mouth. VITAMIN D3 daily    cyanocobalamin 100 MCG tablet Take 100 mcg by mouth daily.   diazepam (VALIUM) 5 MG tablet Take 1 tablet (5 mg total) by mouth 2 (two) times daily. Pt takes one tablet at daytime and one tablet at bedtime   empagliflozin (JARDIANCE) 25 MG TABS tablet Take 1 tablet (25 mg total) by mouth daily before breakfast.   escitalopram (LEXAPRO) 10 MG tablet TAKE 1 TABLET(10 MG) BY MOUTH DAILY   FLOVENT HFA 110 MCG/ACT inhaler INHALE 2 PUFFS INTO THE LUNGS TWICE DAILY   fluticasone (FLONASE) 50 MCG/ACT nasal  spray SHAKE LIQUID AND USE 2 SPRAYS IN EACH NOSTRIL DAILY   gabapentin (NEURONTIN) 300 MG capsule Take 300 mg in AM and 900 mg at bedtime   Garlic 10 MG CAPS Take by mouth.   glucose blood (ACCU-CHEK GUIDE) test strip CHECK BLOOD SUGAR UP TO FOUR TIMES DAILY Dx E11.69   hydrOXYzine (VISTARIL) 25 MG capsule TAKE 1 CAPSULE(25 MG) BY MOUTH THREE TIMES DAILY AS NEEDED   icosapent Ethyl (VASCEPA) 1 g capsule TAKE 4 CAPSULES BY MOUTH EVERY DAY   ipratropium-albuterol  (DUONEB) 0.5-2.5 (3) MG/3ML SOLN USE 1 VIAL VIA NEBULIZER EVERY 6 HOURS   isosorbide mononitrate (IMDUR) 120 MG 24 hr tablet TAKE 1 TABLET(120 MG) BY MOUTH DAILY   levothyroxine (SYNTHROID) 100 MCG tablet TAKE 1 TABLET(100 MCG) BY MOUTH DAILY   linaclotide (LINZESS) 72 MCG capsule Take 1 capsule (72 mcg total) by mouth daily before breakfast.   meloxicam (MOBIC) 7.5 MG tablet Take 1 tablet (7.5 mg total) by mouth daily.   metFORMIN (GLUCOPHAGE-XR) 500 MG 24 hr tablet TAKE 2 TABLETS(1000 MG) BY MOUTH DAILY WITH BREAKFAST   metoprolol tartrate (LOPRESSOR) 25 MG tablet TAKE 1 TABLET(25 MG) BY MOUTH TWICE DAILY   misoprostol (CYTOTEC) 200 MCG tablet Take 1 tablet (200 mcg total) by mouth 4 (four) times daily.   montelukast (SINGULAIR) 10 MG tablet TAKE 1 TABLET(10 MG) BY MOUTH AT BEDTIME   Multiple Vitamins-Minerals (HAIR SKIN AND NAILS FORMULA PO) Take 1 tablet by mouth daily.   nitroGLYCERIN (NITROSTAT) 0.4 MG SL tablet DISSOLVE 1 TABLET UNDER THE TONGUE EVERY 5 MINUTES AS  NEEDED FOR CHEST PAIN. MAX  OF 3 TAB 15 MINUTES. CALL 911 IF PAIN PERSISTS.   omeprazole (PRILOSEC) 20 MG capsule TAKE 1 CAPSULE BY MOUTH TWICE DAILY BEFORE A MEAL   raloxifene (EVISTA) 60 MG tablet TAKE 1 TABLET(60 MG) BY MOUTH DAILY   REPATHA SURECLICK 446 MG/ML SOAJ ADMINISTER 1 ML UNDER THE SKIN EVERY 14 DAYS   sitaGLIPtin (JANUVIA) 100 MG tablet TAKE 1 TABLET BY MOUTH EVERY DAY   solifenacin (VESICARE) 10 MG tablet TAKE 1 TABLET(10 MG) BY MOUTH DAILY   zinc gluconate 50 MG tablet Take 50 mg by mouth daily.   No facility-administered encounter medications on file as of 03/31/2021.    Patient Active Problem List   Diagnosis Date Noted   Precordial chest pain 03/03/2020   Controlled substance agreement signed 08/16/2018   Myalgia 11/24/2017   Iron deficiency anemia 05/26/2017   Vitamin B 12 deficiency 05/26/2017   Obesity (BMI 30-39.9) 05/25/2017   Constipation 05/19/2016   Hypokalemia 04/30/2015   Depression  07/18/2014   Diabetic neuropathy ([place]) 04/17/2014   OAB (overactive bladder) 04/17/2014   Peripheral neuropathy 04/17/2014   Hypothyroidism 12/27/2012   Osteopenia 05/30/2012   Type 2 diabetes mellitus ([place]) 05/21/2012   Asthma 05/21/2012   Chronic obstructive pulmonary disease ([place]) 05/21/2012   Gastroesophageal reflux disease 05/21/2012   Hyperlipidemia associated with type 2 diabetes mellitus ([place]) 01/26/2011   Hypertension associated with diabetes ([place])    Anxiety    Chronic coronary artery disease     Conditions to be addressed/monitored: monitor client completion of ADLs  Care Plan : LCSW care plan  Updates made by Katha Cabal, LCSW since 03/31/2021 12:00 AM     Problem: Coping Skills (General Plan of Care)      Goal: Coping Skills Enhanced; Manage ADLs; Manage Medical Conditions   Start Date: 03/31/2021  Expected End Date: 06/28/2021  This Visit's Progress:  On track  Recent Progress: On track  Priority: Medium  Note:   Current barriers:   Patient in need of assistance with connecting to community resources for possible help with ADLs completion Edema issues Memory issues Mobility issues  Clinical Goals:  LCSW to communicate with client in next 30 days to discuss client completion of ADLs and client completion of daily activities LCSW to communicate with client in next 30 days to discuss pain issues of client and mobility issues of client Client to communicate in next 30 days with Dr. Lottie Dawson, [facility] Pharmacist, to discuss medication questions of client  Clinical Interventions:  Collaboration with Sharion Balloon, FNP regarding development and update of comprehensive plan of care as evidenced by provider attestation and co-signature Assessment of needs, barriers of client. Discussed with Modelle her edema issues and pain in her feet.  She said pain in her feet may keep her from sleeping well at night.  She said she has edema in her legs and ankles. Discussed  with client her medication procurement. She said she has her prescribed medications. Reviewed client energy level. She said she is often fatigued and has low energy Reviewed with client her walking challenges. She said she uses a cane to walk.  Encouraged client to call RNCM as needed in next 30 days for CCM nursing support Reviewed transport needs. She said she has friends who help transport her to her medical appointments and who help her complete community errands  Patient Coping Skills Has support from her brother Attends scheduled medical appointments  Patient Deficits: Memory issues Financial challenges Some walking challenges  Patient Goals: In  next 30 days, patient will: Attend scheduled medical appointments Call RNCM as needed to discuss nursing needs Communicate with LCSW as needed to discuss social work needs -  Follow Up Plan: LCSW to call client on 04/28/2021 at 11:00 AM to assess client needs at that time.       Norva Riffle.Lilee Aldea MSW, [place] representative [facility] Care Management [phone]

## 2021-03-31 NOTE — Patient Instructions (Addendum)
Visit Information  Patient goals: Protect My Health. Complete ADLs. Manage Depression issues  Timeframe:  Short-Term Goal Priority:  Medium Progress:  On Track Start Date:    03/31/21                   Expected End Date:  06/28/21                        Follow Up Date    04/28/2021 at 11:00 AM      Protect My Health: Complete ADLs. Manage Depression issues    Why is this important?   Screening tests can find diseases early when they are easier to treat.  Your doctor or nurse will talk with you about which tests are important for you.  Getting shots for common diseases like the flu and shingles will help prevent them.     Patient Coping Skills Has support from her brother Attends scheduled medical appointments  Patient Deficits: Memory issues Financial challenges Some walking challenges  Patient Goals: In  next 30 days, patient will: Attend scheduled medical appointments Call RNCM as needed to discuss nursing needs Communicate with LCSW as needed to discuss social work needs -  Follow Up Plan: LCSW to call client on 04/28/2021 at 11:00 AM to assess client needs   Norva Riffle.Kelani Robart MSW, [place] representative [facility] Care Management [phone]

## 2021-04-05 ENCOUNTER — Other Ambulatory Visit: Payer: Self-pay | Admitting: Family

## 2021-04-05 DIAGNOSIS — M159 Polyosteoarthritis, unspecified: Secondary | ICD-10-CM

## 2021-04-05 DIAGNOSIS — K5900 Constipation, unspecified: Secondary | ICD-10-CM

## 2021-04-07 ENCOUNTER — Telehealth: Payer: Medicare Other | Admitting: *Deleted

## 2021-04-18 ENCOUNTER — Other Ambulatory Visit: Payer: Self-pay | Admitting: Family

## 2021-04-18 DIAGNOSIS — J4520 Mild intermittent asthma, uncomplicated: Secondary | ICD-10-CM

## 2021-04-19 ENCOUNTER — Other Ambulatory Visit: Payer: Self-pay | Admitting: Family

## 2021-04-19 DIAGNOSIS — J4520 Mild intermittent asthma, uncomplicated: Secondary | ICD-10-CM

## 2021-04-19 NOTE — Telephone Encounter (Signed)
I do not see on patient's medication list. Please review  

## 2021-04-28 ENCOUNTER — Ambulatory Visit (INDEPENDENT_AMBULATORY_CARE_PROVIDER_SITE_OTHER): Payer: Medicare Other | Admitting: Licensed Clinical Social Worker

## 2021-04-28 DIAGNOSIS — F331 Major depressive disorder, recurrent, moderate: Secondary | ICD-10-CM

## 2021-04-28 DIAGNOSIS — E1169 Type 2 diabetes mellitus with other specified complication: Secondary | ICD-10-CM

## 2021-04-28 DIAGNOSIS — I2510 Atherosclerotic heart disease of native coronary artery without angina pectoris: Secondary | ICD-10-CM

## 2021-04-28 DIAGNOSIS — J449 Chronic obstructive pulmonary disease, unspecified: Secondary | ICD-10-CM

## 2021-04-28 DIAGNOSIS — E1159 Type 2 diabetes mellitus with other circulatory complications: Secondary | ICD-10-CM

## 2021-04-28 DIAGNOSIS — E1142 Type 2 diabetes mellitus with diabetic polyneuropathy: Secondary | ICD-10-CM

## 2021-04-28 DIAGNOSIS — I152 Hypertension secondary to endocrine disorders: Secondary | ICD-10-CM

## 2021-04-28 NOTE — Chronic Care Management (AMB) (Signed)
Chronic Care Management    Clinical Social Work Note  04/28/2021 Name: Denise Gardner MRN: [id number] DOB: [birth year]  Denise Gardner is a 76 y.o. year old female who is a primary care patient of Sharion Balloon, FNP. The CCM team was consulted to assist the patient with chronic disease management and/or care coordination needs related to: [employer] .   Engaged with patient by telephone for follow up visit in response to provider referral for social work chronic care management and care coordination services.   Consent to Services:  The patient was given information about Chronic Care Management services, agreed to services, and gave verbal consent prior to initiation of services.  Please see initial visit note for detailed documentation.   Patient agreed to services and consent obtained.   Assessment: Review of patient past medical history, allergies, medications, and health status, including review of relevant consultants reports was performed today as part of a comprehensive evaluation and provision of chronic care management and care coordination services.     SDOH (Social Determinants of Health) assessments and interventions performed:  SDOH Interventions    Flowsheet Row Most Recent Value  SDOH Interventions   Physical Activity Interventions Other (Comments)  [walking challenges. uses a cane to help her walk]  Stress Interventions Provide Counseling  [client has stress related to managing medical needs]  Depression Interventions/Treatment  Counseling, Medication        Advanced Directives Status: See Vynca application for related entries.  CCM Care Plan  Allergies  Allergen Reactions   Milk-Related Compounds Itching and Nausea And Vomiting   Eggs Or Egg-Derived Products Itching, Nausea And Vomiting and Other (See Comments)    headaches   Lac Bovis Other (See Comments)    Per PCP office   Other Other (See Comments)    Other reaction(s): Other (See Comments) Per  [facility] Other reaction(s): Other (See Comments) Per [employer] [facility] Per [facility]   Statins    Crestor [Rosuvastatin]     Myalgias    Ezetimibe Other (See Comments)    mylagia Per PCP office    Outpatient Encounter Medications as of 04/28/2021  Medication Sig   Accu-Chek FastClix Lancets MISC CHECK BLOOD SUGAR UP TO FOUR TIMES DAILYDx E11.9   acetaminophen (TYLENOL) 500 MG tablet Take 500 mg by mouth 2 (two) times daily. Pt takes 2 tablets at bedtime   amLODipine (NORVASC) 10 MG tablet TAKE 1 TABLET BY MOUTH DAILY   Ascorbic Acid (VITAMIN C) 100 MG tablet Take 100 mg by mouth daily.   aspirin 81 MG chewable tablet Chew 81 mg by mouth daily.   Biotin 5000 MCG TABS Take by mouth.   blood glucose meter kit and supplies Dispense based on patient and insurance preference. Use up to four times daily as directed. E11. Whatever insurance will cover   cholecalciferol (VITAMIN D) 400 UNITS TABS Take by mouth. VITAMIN D3 daily    cyanocobalamin 100 MCG tablet Take 100 mcg by mouth daily.   diazepam (VALIUM) 5 MG tablet Take 1 tablet (5 mg total) by mouth 2 (two) times daily. Pt takes one tablet at daytime and one tablet at bedtime   empagliflozin (JARDIANCE) 25 MG TABS tablet TAKE 1 TABLET(25 MG) BY MOUTH DAILY BEFORE AND BREAKFAST   escitalopram (LEXAPRO) 10 MG tablet TAKE 1 TABLET(10 MG) BY MOUTH DAILY   FLOVENT HFA 110 MCG/ACT inhaler INHALE 2 PUFFS INTO THE LUNGS TWICE DAILY   fluticasone (FLONASE) 50 MCG/ACT nasal spray  SHAKE LIQUID AND USE 2 SPRAYS IN EACH NOSTRIL DAILY   gabapentin (NEURONTIN) 300 MG capsule Take 300 mg in AM and 900 mg at bedtime   Garlic 10 MG CAPS Take by mouth.   glucose blood (ACCU-CHEK GUIDE) test strip CHECK BLOOD SUGAR UP TO FOUR TIMES DAILY Dx E11.69   hydrOXYzine (VISTARIL) 25 MG capsule TAKE 1 CAPSULE(25 MG) BY MOUTH THREE TIMES DAILY AS NEEDED   icosapent Ethyl (VASCEPA) 1 g capsule TAKE 4 CAPSULES BY MOUTH EVERY DAY   ipratropium-albuterol  (DUONEB) 0.5-2.5 (3) MG/3ML SOLN USE 1 VIAL VIA NEBULIZER EVERY 6 HOURS   isosorbide mononitrate (IMDUR) 120 MG 24 hr tablet TAKE 1 TABLET(120 MG) BY MOUTH DAILY   levothyroxine (SYNTHROID) 100 MCG tablet TAKE 1 TABLET(100 MCG) BY MOUTH DAILY   LINZESS 72 MCG capsule TAKE 1 CAPSULE(72 MCG) BY MOUTH DAILY BEFORE AND BREAKFAST   meloxicam (MOBIC) 7.5 MG tablet TAKE 1 TABLET(7.5 MG) BY MOUTH DAILY   metFORMIN (GLUCOPHAGE-XR) 500 MG 24 hr tablet TAKE 2 TABLETS(1000 MG) BY MOUTH DAILY WITH BREAKFAST   metoprolol tartrate (LOPRESSOR) 25 MG tablet TAKE 1 TABLET(25 MG) BY MOUTH TWICE DAILY   misoprostol (CYTOTEC) 200 MCG tablet TAKE 1 TABLET(200 MCG) BY MOUTH FOUR TIMES DAILY   montelukast (SINGULAIR) 10 MG tablet TAKE 1 TABLET(10 MG) BY MOUTH AT BEDTIME   Multiple Vitamins-Minerals (HAIR SKIN AND NAILS FORMULA PO) Take 1 tablet by mouth daily.   nitroGLYCERIN (NITROSTAT) 0.4 MG SL tablet DISSOLVE 1 TABLET UNDER THE TONGUE EVERY 5 MINUTES AS  NEEDED FOR CHEST PAIN. MAX  OF 3 TAB 15 MINUTES. CALL 911 IF PAIN PERSISTS.   omeprazole (PRILOSEC) 20 MG capsule TAKE 1 CAPSULE BY MOUTH TWICE DAILY BEFORE A MEAL   raloxifene (EVISTA) 60 MG tablet TAKE 1 TABLET(60 MG) BY MOUTH DAILY   REPATHA SURECLICK 324 MG/ML SOAJ ADMINISTER 1 ML UNDER THE SKIN EVERY 14 DAYS   sitaGLIPtin (JANUVIA) 100 MG tablet TAKE 1 TABLET BY MOUTH EVERY DAY   solifenacin (VESICARE) 10 MG tablet TAKE 1 TABLET(10 MG) BY MOUTH DAILY   zinc gluconate 50 MG tablet Take 50 mg by mouth daily.   No facility-administered encounter medications on file as of 04/28/2021.    Patient Active Problem List   Diagnosis Date Noted   Precordial chest pain 03/03/2020   Controlled substance agreement signed 08/16/2018   Myalgia 11/24/2017   Iron deficiency anemia 05/26/2017   Vitamin B 12 deficiency 05/26/2017   Obesity (BMI 30-39.9) 05/25/2017   Constipation 05/19/2016   Hypokalemia 04/30/2015   Depression 07/18/2014   Diabetic neuropathy ([place])  04/17/2014   OAB (overactive bladder) 04/17/2014   Peripheral neuropathy 04/17/2014   Hypothyroidism 12/27/2012   Osteopenia 05/30/2012   Type 2 diabetes mellitus ([place]) 05/21/2012   Asthma 05/21/2012   Chronic obstructive pulmonary disease ([place]) 05/21/2012   Gastroesophageal reflux disease 05/21/2012   Hyperlipidemia associated with type 2 diabetes mellitus ([place]) 01/26/2011   Hypertension associated with diabetes ([place])    Anxiety    Chronic coronary artery disease     Conditions to be addressed/monitored: monitor client completion of ADLs  Care Plan : LCSW care plan  Updates made by Katha Cabal, LCSW since 04/28/2021 12:00 AM     Problem: Coping Skills (General Plan of Care)      Goal: Coping Skills Enhanced; Manage ADLs; Manage Medical Conditions   Start Date: 04/28/2021  Expected End Date: 07/29/2021  This Visit's Progress: On track  Recent Progress: On track  Priority: Medium  Note:   Current barriers:   Patient in need of assistance with connecting to community resources for possible help with ADLs completion Edema issues Memory issues Mobility issues  Clinical Goals:  LCSW to communicate with client in next 30 days to discuss client completion of ADLs and client completion of daily activities LCSW to communicate with client in next 30 days to discuss pain issues of client and mobility issues of client Client to communicate in next 30 days with Dr. Lottie Dawson, [facility] Pharmacist, to discuss medication questions of client  Clinical Interventions:  Collaboration with Sharion Balloon, FNP regarding development and update of comprehensive plan of care as evidenced by provider attestation and co-signature Assessment of needs, barriers of client. Discussed with Maryn her edema issues and pain in her feet.  She said pain in her feet may keep her from sleeping well at night.  She said she has edema in her legs and ankles. Discussed with client her medication procurement.  She said she has her prescribed medications. Reviewed client energy level. She said she is often fatigued and has low energy Reviewed with client her walking challenges. She said she uses a cane to walk.  Encouraged client to call RNCM as needed in next 30 days for CCM nursing support Discussed recent fall of client. She said she fell recently in her yard but id not have any injury. She uses a cane or a walker as needed to ambulate Discussed appetite issues. She said she has reduced appetite. Reviewed memory challenges. She said she is having memory issues. LCSW talked with Lasondra about using a calendar to help her keep the dates and times of her appointments scheduled.  Patient Coping Skills Has support from her brother Attends scheduled medical appointments  Patient Deficits: Memory issues Financial challenges Some walking challenges  Patient Goals: In  next 30 days, patient will: Attend scheduled medical appointments Call RNCM as needed to discuss nursing needs Communicate with LCSW as needed to discuss social work needs -  Follow Up Plan: LCSW to call client on 06/22/21 at 1:00 PM to assess client needs at that time.       Norva Riffle.Ether Wolters MSW, [place] [employer] representative [facility] Care Management [phone]

## 2021-04-28 NOTE — Patient Instructions (Addendum)
Visit Information ? ?Patient Goals:  Protect My Health; Complete ADLs. Manage Depression issues ? ?Timeframe:  Short-Term Goal ?Priority:  Medium ?Progress:  On Track ?Start Date:    04/28/21                    ?Expected End Date:  07/29/21                         ? ?Follow Up Date    06/22/21 at 1:00 PM    ?  ?Protect My Health: Complete ADLs. Manage Depression issues  ?  ?Why is this important?   ?Screening tests can find diseases early when they are easier to treat.  ?Your doctor or nurse will talk with you about which tests are important for you.  ?Getting shots for common diseases like the flu and shingles will help prevent them.   ?  ?Patient Coping Skills ?Has support from her brother ?Attends scheduled medical appointments ? ?Patient Deficits: ?Memory issues ?Financial challenges ?Some walking challenges ? ?Patient Goals: In  next 30 days, patient will: ?Attend scheduled medical appointments ?Call RNCM as needed to discuss nursing needs ?Communicate with LCSW as needed to discuss social work needs ?-  ?Follow Up Plan: LCSW to call client on 06/22/21 at 1:00 PM to assess client needs  ? ?Norva Riffle.Barry Culverhouse MSW, LCSW ?Licensed Clinical Social Worker ?[place] Management ?[phone] ?

## 2021-05-03 ENCOUNTER — Other Ambulatory Visit: Payer: Self-pay | Admitting: Family

## 2021-05-14 ENCOUNTER — Telehealth: Payer: Self-pay | Admitting: Family

## 2021-05-14 ENCOUNTER — Encounter: Payer: Self-pay | Admitting: Family

## 2021-05-14 ENCOUNTER — Ambulatory Visit (INDEPENDENT_AMBULATORY_CARE_PROVIDER_SITE_OTHER): Payer: Medicare Other | Admitting: Family

## 2021-05-14 VITALS — BP 136/78 | HR 67 | Temp 97.9°F | Ht 65.0 in | Wt 194.8 lb

## 2021-05-14 DIAGNOSIS — I152 Hypertension secondary to endocrine disorders: Secondary | ICD-10-CM

## 2021-05-14 DIAGNOSIS — F1320 Sedative, hypnotic or anxiolytic dependence, uncomplicated: Secondary | ICD-10-CM | POA: Diagnosis not present

## 2021-05-14 DIAGNOSIS — E1159 Type 2 diabetes mellitus with other circulatory complications: Secondary | ICD-10-CM

## 2021-05-14 DIAGNOSIS — E1149 Type 2 diabetes mellitus with other diabetic neurological complication: Secondary | ICD-10-CM

## 2021-05-14 DIAGNOSIS — F3342 Major depressive disorder, recurrent, in full remission: Secondary | ICD-10-CM | POA: Diagnosis not present

## 2021-05-14 DIAGNOSIS — E039 Hypothyroidism, unspecified: Secondary | ICD-10-CM

## 2021-05-14 DIAGNOSIS — J449 Chronic obstructive pulmonary disease, unspecified: Secondary | ICD-10-CM | POA: Diagnosis not present

## 2021-05-14 DIAGNOSIS — I2510 Atherosclerotic heart disease of native coronary artery without angina pectoris: Secondary | ICD-10-CM

## 2021-05-14 DIAGNOSIS — E669 Obesity, unspecified: Secondary | ICD-10-CM

## 2021-05-14 DIAGNOSIS — K5900 Constipation, unspecified: Secondary | ICD-10-CM | POA: Diagnosis not present

## 2021-05-14 DIAGNOSIS — E785 Hyperlipidemia, unspecified: Secondary | ICD-10-CM

## 2021-05-14 DIAGNOSIS — Z79891 Long term (current) use of opiate analgesic: Secondary | ICD-10-CM | POA: Diagnosis not present

## 2021-05-14 DIAGNOSIS — M8589 Other specified disorders of bone density and structure, multiple sites: Secondary | ICD-10-CM

## 2021-05-14 DIAGNOSIS — M159 Polyosteoarthritis, unspecified: Secondary | ICD-10-CM

## 2021-05-14 DIAGNOSIS — D509 Iron deficiency anemia, unspecified: Secondary | ICD-10-CM

## 2021-05-14 DIAGNOSIS — Z79899 Other long term (current) drug therapy: Secondary | ICD-10-CM

## 2021-05-14 DIAGNOSIS — J4520 Mild intermittent asthma, uncomplicated: Secondary | ICD-10-CM

## 2021-05-14 DIAGNOSIS — F419 Anxiety disorder, unspecified: Secondary | ICD-10-CM | POA: Diagnosis not present

## 2021-05-14 DIAGNOSIS — K219 Gastro-esophageal reflux disease without esophagitis: Secondary | ICD-10-CM

## 2021-05-14 DIAGNOSIS — E1169 Type 2 diabetes mellitus with other specified complication: Secondary | ICD-10-CM

## 2021-05-14 DIAGNOSIS — E1142 Type 2 diabetes mellitus with diabetic polyneuropathy: Secondary | ICD-10-CM | POA: Diagnosis not present

## 2021-05-14 DIAGNOSIS — N3281 Overactive bladder: Secondary | ICD-10-CM

## 2021-05-14 LAB — BAYER DCA HB A1C WAIVED: HB A1C (BAYER DCA - WAIVED): 6.6 % — ABNORMAL HIGH (ref 4.8–5.6)

## 2021-05-14 MED ORDER — DIAZEPAM 5 MG PO TABS: 5.0000 mg | ORAL_TABLET | Freq: Two times a day (BID) | ORAL | 2 refills | Status: DC

## 2021-05-14 MED ORDER — HYDROXYZINE PAMOATE 25 MG PO CAPS: ORAL_CAPSULE | ORAL | 1 refills | Status: DC

## 2021-05-14 MED ORDER — AMLODIPINE BESYLATE 10 MG PO TABS: 10.0000 mg | ORAL_TABLET | Freq: Every day | ORAL | 1 refills | Status: DC

## 2021-05-14 MED ORDER — SOLIFENACIN SUCCINATE 10 MG PO TABS: ORAL_TABLET | ORAL | 1 refills | Status: DC

## 2021-05-14 MED ORDER — REPATHA SURECLICK 140 MG/ML ~~LOC~~ SOAJ: SUBCUTANEOUS | 3 refills | Status: DC

## 2021-05-14 MED ORDER — MELOXICAM 7.5 MG PO TABS: ORAL_TABLET | ORAL | 3 refills | Status: DC

## 2021-05-14 MED ORDER — ICOSAPENT ETHYL 1 G PO CAPS: ORAL_CAPSULE | ORAL | 1 refills | Status: DC

## 2021-05-14 MED ORDER — OMEPRAZOLE 20 MG PO CPDR: DELAYED_RELEASE_CAPSULE | ORAL | 1 refills | Status: DC

## 2021-05-14 MED ORDER — OZEMPIC (0.25 OR 0.5 MG/DOSE) 2 MG/1.5ML ~~LOC~~ SOPN: 0.5000 mg | PEN_INJECTOR | SUBCUTANEOUS | 2 refills | Status: DC

## 2021-05-14 MED ORDER — EMPAGLIFLOZIN 25 MG PO TABS: ORAL_TABLET | ORAL | 3 refills | Status: DC

## 2021-05-14 MED ORDER — LINACLOTIDE 72 MCG PO CAPS: ORAL_CAPSULE | ORAL | 3 refills | Status: DC

## 2021-05-14 MED ORDER — ACCU-CHEK GUIDE VI STRP: ORAL_STRIP | 12 refills | Status: DC

## 2021-05-14 MED ORDER — ESCITALOPRAM OXALATE 10 MG PO TABS: ORAL_TABLET | ORAL | 3 refills | Status: DC

## 2021-05-14 MED ORDER — METOPROLOL TARTRATE 25 MG PO TABS: ORAL_TABLET | ORAL | 1 refills | Status: DC

## 2021-05-14 MED ORDER — GABAPENTIN 300 MG PO CAPS: ORAL_CAPSULE | ORAL | 2 refills | Status: DC

## 2021-05-14 MED ORDER — LEVOTHYROXINE SODIUM 100 MCG PO TABS: ORAL_TABLET | ORAL | 3 refills | Status: DC

## 2021-05-14 MED ORDER — ISOSORBIDE MONONITRATE ER 120 MG PO TB24: ORAL_TABLET | ORAL | 3 refills | Status: DC

## 2021-05-14 MED ORDER — RALOXIFENE HCL 60 MG PO TABS: ORAL_TABLET | ORAL | 3 refills | Status: DC

## 2021-05-14 MED ORDER — METFORMIN HCL ER 500 MG PO TB24: ORAL_TABLET | ORAL | 3 refills | Status: DC

## 2021-05-14 NOTE — Addendum Note (Signed)
Addended by: Brynda Peon F on: 05/14/2021 12:04 PM ? ? Modules accepted: Orders ? ?

## 2021-05-14 NOTE — Patient Instructions (Signed)

## 2021-05-14 NOTE — Telephone Encounter (Signed)
Please add labs so patient can come in before appt to have her lab work done  ?

## 2021-05-14 NOTE — Telephone Encounter (Signed)
Labs were done today.

## 2021-05-14 NOTE — Progress Notes (Signed)
Subjective:    Patient ID: Denise Gardner, female    DOB: 16-Apr-1945, 76 y.o.   MRN: [id number]  Chief Complaint  Patient presents with   Medical Management of Chronic Issues   Pt presents to the office today for chronic follow up. She is followed by  Cardiologists annually for CAD. She is followed by Hematologists  for iron deficiency anemia and Vit B 12 deficiency. Hypertension This is a chronic problem. The current episode started more than 1 year ago. The problem has been resolved since onset. The problem is controlled. Associated symptoms include anxiety, blurred vision, malaise/fatigue, peripheral edema and shortness of breath. Risk factors for coronary artery disease include dyslipidemia, diabetes mellitus, obesity and sedentary lifestyle. The current treatment provides moderate improvement. Identifiable causes of hypertension include a thyroid problem.  Asthma She complains of hoarse voice, shortness of breath and wheezing. There is no cough. This is a chronic problem. The current episode started more than 1 year ago. The problem occurs intermittently. Associated symptoms include heartburn and malaise/fatigue. She reports moderate improvement on treatment. Her past medical history is significant for asthma.  Gastroesophageal Reflux She complains of belching, heartburn, a hoarse voice and wheezing. She reports no coughing. This is a chronic problem. The current episode started more than 1 year ago. The problem occurs occasionally. Associated symptoms include fatigue. Risk factors include obesity. She has tried a PPI for the symptoms. The treatment provided moderate relief.  Diabetes She presents for her follow-up diabetic visit. She has type 2 diabetes mellitus. Hypoglycemia symptoms include nervousness/anxiousness. Associated symptoms include blurred vision, fatigue and foot paresthesias. Symptoms are worsening. Risk factors for coronary artery disease include dyslipidemia, diabetes mellitus,  hypertension, sedentary lifestyle and post-menopausal. She is following a generally unhealthy diet. Her overall blood glucose range is >200 mg/dl. Eye exam is current.  Thyroid Problem Presents for follow-up visit. Symptoms include anxiety, constipation, depressed mood, fatigue and hoarse voice. The symptoms have been stable. Her past medical history is significant for hyperlipidemia.  Hyperlipidemia This is a chronic problem. The current episode started more than 1 year ago. Exacerbating diseases include obesity. Associated symptoms include shortness of breath. Risk factors for coronary artery disease include dyslipidemia, diabetes mellitus, hypertension, a sedentary lifestyle and post-menopausal.  Urinary Frequency  This is a chronic problem. The current episode started more than 1 year ago. The problem occurs intermittently. The problem has been waxing and waning. Associated symptoms include frequency. The treatment provided mild relief.  Anemia Presents for follow-up visit. Symptoms include malaise/fatigue.  Anxiety Presents for follow-up visit. Symptoms include depressed mood, excessive worry, insomnia, nervous/anxious behavior, restlessness and shortness of breath. Patient reports no irritability. Symptoms occur occasionally. The severity of symptoms is moderate.   Her past medical history is significant for anemia and asthma.  Constipation This is a chronic problem. The current episode started more than 1 year ago. The problem has been waxing and waning since onset. Treatments tried: lizness. The treatment provided moderate relief.     Review of Systems  Constitutional:  Positive for fatigue and malaise/fatigue. Negative for irritability.  HENT:  Positive for hoarse voice.   Eyes:  Positive for blurred vision.  Respiratory:  Positive for shortness of breath and wheezing. Negative for cough.   Gastrointestinal:  Positive for constipation and heartburn.  Genitourinary:  Positive for  frequency.  Psychiatric/Behavioral:  The patient is nervous/anxious and has insomnia.   All other systems reviewed and are negative.     Objective:   Physical  Exam Vitals reviewed.  Constitutional:      General: She is not in acute distress.    Appearance: She is well-developed. She is obese.  HENT:     Head: Normocephalic and atraumatic.     Right Ear: Tympanic membrane normal.     Left Ear: Tympanic membrane normal.  Eyes:     Pupils: Pupils are equal, round, and reactive to light.  Neck:     Thyroid: No thyromegaly.  Cardiovascular:     Rate and Rhythm: Normal rate and regular rhythm.     Heart sounds: Normal heart sounds. No murmur heard. Pulmonary:     Effort: Pulmonary effort is normal. No respiratory distress.     Breath sounds: Normal breath sounds. No wheezing.  Abdominal:     General: Bowel sounds are normal. There is no distension.     Palpations: Abdomen is soft.     Tenderness: There is no abdominal tenderness.  Musculoskeletal:        General: No tenderness. Normal range of motion.     Cervical back: Normal range of motion and neck supple.  Skin:    General: Skin is warm and dry.  Neurological:     Mental Status: She is alert and oriented to person, place, and time.     Cranial Nerves: No cranial nerve deficit.     Gait: Gait abnormal (using cane).     Deep Tendon Reflexes: Reflexes are normal and symmetric.  Psychiatric:        Behavior: Behavior normal.        Thought Content: Thought content normal.        Judgment: Judgment normal.      BP 136/78   Pulse 67   Temp 97.9 F (36.6 C) (Temporal)   Ht 5\' 5"  (1.651 m)   Wt 194 lb 12.8 oz (88.4 kg)   BMI 32.42 kg/m      Assessment & Plan:  Denise Gardner comes in today with chief complaint of Medical Management of Chronic Issues   Diagnosis and orders addressed:  1. Anxiety  - hydrOXYzine (VISTARIL) 25 MG capsule; TAKE 1 CAPSULE(25 MG) BY MOUTH THREE TIMES DAILY AS NEEDED  Dispense: 90  capsule; Refill: 1 - escitalopram (LEXAPRO) 10 MG tablet; TAKE 1 TABLET(10 MG) BY MOUTH DAILY  Dispense: 90 tablet; Refill: 3 - diazepam (VALIUM) 5 MG tablet; Take 1 tablet (5 mg total) by mouth 2 (two) times daily. Pt takes one tablet at daytime and one tablet at bedtime  Dispense: 30 tablet; Refill: 2 - CBC with Differential/Platelet - CMP14+EGFR  2. Depression  - escitalopram (LEXAPRO) 10 MG tablet; TAKE 1 TABLET(10 MG) BY MOUTH DAILY  Dispense: 90 tablet; Refill: 3 - CBC with Differential/Platelet - CMP14+EGFR  3. Benzodiazepine dependence (HCC)  - diazepam (VALIUM) 5 MG tablet; Take 1 tablet (5 mg total) by mouth 2 (two) times daily. Pt takes one tablet at daytime and one tablet at bedtime  Dispense: 30 tablet; Refill: 2 - CBC with Differential/Platelet - CMP14+EGFR  4. Constipation, unspecified constipation type - linaclotide (LINZESS) 72 MCG capsule; TAKE 1 CAPSULE(72 MCG) BY MOUTH DAILY BEFORE AND BREAKFAST  Dispense: 90 capsule; Refill: 3 - CBC with Differential/Platelet - CMP14+EGFR  5. Type 2 diabetes mellitus with diabetic polyneuropathy, without long-term current use of insulin (HCC) Stop Januvia and starte Ozempic 0.5 mg  Strict low carb diet  RTO in 3 months  - gabapentin (NEURONTIN) 300 MG capsule; Take 300 mg in AM and 900 mg  at bedtime  Dispense: 360 capsule; Refill: 2 - metFORMIN (GLUCOPHAGE-XR) 500 MG 24 hr tablet; TAKE 2 TABLETS(1000 MG) BY MOUTH DAILY WITH BREAKFAST  Dispense: 180 tablet; Refill: 3 - glucose blood (ACCU-CHEK GUIDE) test strip; CHECK BLOOD SUGAR UP TO FOUR TIMES DAILY Dx E11.69  Dispense: 100 each; Refill: 12 - CBC with Differential/Platelet - CMP14+EGFR - Bayer DCA Hb A1c Waived - Semaglutide,0.25 or 0.5MG /DOS, (OZEMPIC, 0.25 OR 0.5 MG/DOSE,) 2 MG/1.5ML SOPN; Inject 0.5 mg into the skin once a week.  Dispense: 6 mL; Refill: 2  6. Diabetes mellitus type 2 with neurological manifestations (HCC)  - CBC with Differential/Platelet -  CMP14+EGFR  7. Diabetic polyneuropathy associated with type 2 diabetes mellitus (HCC) - CBC with Differential/Platelet - CMP14+EGFR  8. Gastroesophageal reflux disease with esophagitis  - omeprazole (PRILOSEC) 20 MG capsule; TAKE 1 CAPSULE BY MOUTH TWICE DAILY BEFORE A MEAL  Dispense: 180 capsule; Refill: 1 - CBC with Differential/Platelet - CMP14+EGFR  9. Hypertension associated with diabetes (HCC) - metoprolol tartrate (LOPRESSOR) 25 MG tablet; TAKE 1 TABLET(25 MG) BY MOUTH TWICE DAILY  Dispense: 180 tablet; Refill: 1 - amLODipine (NORVASC) 10 MG tablet; Take 1 tablet (10 mg total) by mouth daily.  Dispense: 90 tablet; Refill: 1 - CBC with Differential/Platelet - CMP14+EGFR  10. Mild intermittent asthma without complication - CBC with Differential/Platelet - CMP14+EGFR  11. Osteoarthritis of multiple joints, unspecified osteoarthritis type - meloxicam (MOBIC) 7.5 MG tablet; TAKE 1 TABLET(7.5 MG) BY MOUTH DAILY  Dispense: 90 tablet; Refill: 3 - CBC with Differential/Platelet - CMP14+EGFR  12. Osteopenia - raloxifene (EVISTA) 60 MG tablet; TAKE 1 TABLET(60 MG) BY MOUTH DAILY  Dispense: 90 tablet; Refill: 3 - CBC with Differential/Platelet - CMP14+EGFR  13. Chronic coronary artery disease - CBC with Differential/Platelet - CMP14+EGFR  14. Chronic obstructive pulmonary disease, unspecified COPD type (HCC)  - CBC with Differential/Platelet - CMP14+EGFR  15. Gastroesophageal reflux disease, unspecified whether esophagitis present - CBC with Differential/Platelet - CMP14+EGFR  16. Hyperlipidemia associated with type 2 diabetes mellitus (HCC) - CBC with Differential/Platelet - CMP14+EGFR - Lipid panel  17. Hypothyroidism, unspecified type  - CBC with Differential/Platelet - CMP14+EGFR - TSH  18. OAB (overactive bladder) - CBC with Differential/Platelet - CMP14+EGFR  19. Controlled substance agreement signed  - CBC with Differential/Platelet - CMP14+EGFR  20.  Iron deficiency anemia, unspecified iron deficiency anemia type - CBC with Differential/Platelet - CMP14+EGFR  21. Obesity (BMI 30-39.9)  - CBC with Differential/Platelet - CMP14+EGFR   Labs pending Patient reviewed in [place] controlled database, no flags noted. Contract and drug screen updated today.  Health Maintenance reviewed Diet and exercise encouraged  Follow up plan: 3 months    Jannifer Rodney, FNP

## 2021-05-15 LAB — TSH: TSH: 0.22 u[IU]/mL — ABNORMAL LOW (ref 0.450–4.500)

## 2021-05-15 LAB — CBC WITH DIFFERENTIAL/PLATELET
Basophils Absolute: 0.1 10*3/uL (ref 0.0–0.2)
Basos: 1 %
EOS (ABSOLUTE): 0.4 10*3/uL (ref 0.0–0.4)
Eos: 4 %
Hematocrit: 44.7 % (ref 34.0–46.6)
Hemoglobin: 14.6 g/dL (ref 11.1–15.9)
Immature Grans (Abs): 0 10*3/uL (ref 0.0–0.1)
Immature Granulocytes: 0 %
Lymphocytes Absolute: 3 10*3/uL (ref 0.7–3.1)
Lymphs: 36 %
MCH: 28.9 pg (ref 26.6–33.0)
MCHC: 32.7 g/dL (ref 31.5–35.7)
MCV: 88 fL (ref 79–97)
Monocytes Absolute: 0.8 10*3/uL (ref 0.1–0.9)
Monocytes: 10 %
Neutrophils Absolute: 4 10*3/uL (ref 1.4–7.0)
Neutrophils: 49 %
Platelets: 221 10*3/uL (ref 150–450)
RBC: 5.06 x10E6/uL (ref 3.77–5.28)
RDW: 12.8 % (ref 11.7–15.4)
WBC: 8.2 10*3/uL (ref 3.4–10.8)

## 2021-05-15 LAB — CMP14+EGFR
ALT: 13 IU/L (ref 0–32)
AST: 14 IU/L (ref 0–40)
Albumin/Globulin Ratio: 2 (ref 1.2–2.2)
Albumin: 4.2 g/dL (ref 3.7–4.7)
Alkaline Phosphatase: 46 IU/L (ref 44–121)
BUN/Creatinine Ratio: 18 (ref 12–28)
BUN: 22 mg/dL (ref 8–27)
Bilirubin Total: 0.4 mg/dL (ref 0.0–1.2)
CO2: 27 mmol/L (ref 20–29)
Calcium: 9.6 mg/dL (ref 8.7–10.3)
Chloride: 101 mmol/L (ref 96–106)
Creatinine, Ser: 1.2 mg/dL — ABNORMAL HIGH (ref 0.57–1.00)
Globulin, Total: 2.1 g/dL (ref 1.5–4.5)
Glucose: 132 mg/dL — ABNORMAL HIGH (ref 70–99)
Potassium: 4.5 mmol/L (ref 3.5–5.2)
Sodium: 141 mmol/L (ref 134–144)
Total Protein: 6.3 g/dL (ref 6.0–8.5)
eGFR: 47 mL/min/{1.73_m2} — ABNORMAL LOW (ref >59)

## 2021-05-15 LAB — LIPID PANEL
Chol/HDL Ratio: 1.7 ratio (ref 0.0–4.4)
Cholesterol, Total: 111 mg/dL (ref 100–199)
HDL: 64 mg/dL (ref >39)
LDL Chol Calc (NIH): 32 mg/dL (ref 0–99)
Triglycerides: 69 mg/dL (ref 0–149)
VLDL Cholesterol Cal: 15 mg/dL (ref 5–40)

## 2021-05-17 ENCOUNTER — Other Ambulatory Visit: Payer: Self-pay | Admitting: Family

## 2021-05-17 MED ORDER — LEVOTHYROXINE SODIUM 88 MCG PO TABS: 88.0000 ug | ORAL_TABLET | Freq: Every day | ORAL | 1 refills | Status: DC

## 2021-05-21 LAB — TOXASSURE SELECT 13 (MW), URINE

## 2021-05-24 ENCOUNTER — Ambulatory Visit: Payer: Medicare Other | Admitting: *Deleted

## 2021-05-24 DIAGNOSIS — I152 Hypertension secondary to endocrine disorders: Secondary | ICD-10-CM

## 2021-05-24 DIAGNOSIS — E1159 Type 2 diabetes mellitus with other circulatory complications: Secondary | ICD-10-CM

## 2021-05-24 DIAGNOSIS — E1149 Type 2 diabetes mellitus with other diabetic neurological complication: Secondary | ICD-10-CM

## 2021-05-24 NOTE — Chronic Care Management (AMB) (Signed)
Chronic Care Management   CCM RN Visit Note  05/24/2021 Name: Denise Gardner MRN: [id number] DOB: 08/10/1945  Subjective: Denise Gardner is a 76 y.o. year old female who is a primary care patient of Junie Spencer, FNP. The care management team was consulted for assistance with disease management and care coordination needs.    Engaged with patient by telephone for follow up visit in response to provider referral for case management and/or care coordination services.   Consent to Services:  The patient was given information about Chronic Care Management services, agreed to services, and gave verbal consent prior to initiation of services.  Please see initial visit note for detailed documentation.   Patient agreed to services and verbal consent obtained.   Assessment: Review of patient past medical history, allergies, medications, health status, including review of consultants reports, laboratory and other test data, was performed as part of comprehensive evaluation and provision of chronic care management services.   SDOH (Social Determinants of Health) assessments and interventions performed:    CCM Care Plan  Allergies  Allergen Reactions   Milk-Related Compounds Itching and Nausea And Vomiting   Eggs Or Egg-Derived Products Itching, Nausea And Vomiting and Other (See Comments)    headaches   Lac Bovis Other (See Comments)    Per PCP office   Other Other (See Comments)    Other reaction(s): Other (See Comments) Per [facility] Other reaction(s): Other (See Comments) Per [facility] Per [facility]   Statins    Crestor [Rosuvastatin]     Myalgias    Ezetimibe Other (See Comments)    mylagia Per PCP office    Outpatient Encounter Medications as of 05/24/2021  Medication Sig   Accu-Chek FastClix Lancets MISC CHECK BLOOD SUGAR UP TO FOUR TIMES DAILYDx E11.9   acetaminophen (TYLENOL) 500 MG tablet Take 500 mg by mouth 2 (two) times daily. Pt takes 2 tablets  at bedtime   amLODipine (NORVASC) 10 MG tablet Take 1 tablet (10 mg total) by mouth daily.   Ascorbic Acid (VITAMIN C) 100 MG tablet Take 100 mg by mouth daily.   aspirin 81 MG chewable tablet Chew 81 mg by mouth daily.   Biotin 5000 MCG TABS Take by mouth.   blood glucose meter kit and supplies Dispense based on patient and insurance preference. Use up to four times daily as directed. E11. Whatever insurance will cover   cholecalciferol (VITAMIN D) 400 UNITS TABS Take by mouth. VITAMIN D3 daily    cyanocobalamin 100 MCG tablet Take 100 mcg by mouth daily.   diazepam (VALIUM) 5 MG tablet Take 1 tablet (5 mg total) by mouth 2 (two) times daily. Pt takes one tablet at daytime and one tablet at bedtime   empagliflozin (JARDIANCE) 25 MG TABS tablet TAKE 1 TABLET(25 MG) BY MOUTH DAILY BEFORE AND BREAKFAST   escitalopram (LEXAPRO) 10 MG tablet TAKE 1 TABLET(10 MG) BY MOUTH DAILY   Evolocumab (REPATHA SURECLICK) 140 MG/ML SOAJ ADMINISTER 1 ML UNDER THE SKIN EVERY 14 DAYS   FLOVENT HFA 110 MCG/ACT inhaler INHALE 2 PUFFS INTO THE LUNGS TWICE DAILY   fluticasone (FLONASE) 50 MCG/ACT nasal spray SHAKE LIQUID AND USE 2 SPRAYS IN EACH NOSTRIL DAILY   gabapentin (NEURONTIN) 300 MG capsule Take 300 mg in AM and 900 mg at bedtime   Garlic 10 MG CAPS Take by mouth.   glucose blood (ACCU-CHEK GUIDE) test strip CHECK BLOOD SUGAR UP TO FOUR TIMES DAILY Dx E11.69   hydrOXYzine (  VISTARIL) 25 MG capsule TAKE 1 CAPSULE(25 MG) BY MOUTH THREE TIMES DAILY AS NEEDED   icosapent Ethyl (VASCEPA) 1 g capsule TAKE 4 CAPSULES BY MOUTH EVERY DAY   ipratropium-albuterol (DUONEB) 0.5-2.5 (3) MG/3ML SOLN USE 1 VIAL VIA NEBULIZER EVERY 6 HOURS   isosorbide mononitrate (IMDUR) 120 MG 24 hr tablet TAKE 1 TABLET(120 MG) BY MOUTH DAILY   levothyroxine (SYNTHROID) 88 MCG tablet Take 1 tablet (88 mcg total) by mouth daily.   linaclotide (LINZESS) 72 MCG capsule TAKE 1 CAPSULE(72 MCG) BY MOUTH DAILY BEFORE AND BREAKFAST   meloxicam  (MOBIC) 7.5 MG tablet TAKE 1 TABLET(7.5 MG) BY MOUTH DAILY   metFORMIN (GLUCOPHAGE-XR) 500 MG 24 hr tablet TAKE 2 TABLETS(1000 MG) BY MOUTH DAILY WITH BREAKFAST   metoprolol tartrate (LOPRESSOR) 25 MG tablet TAKE 1 TABLET(25 MG) BY MOUTH TWICE DAILY   misoprostol (CYTOTEC) 200 MCG tablet TAKE 1 TABLET(200 MCG) BY MOUTH FOUR TIMES DAILY   montelukast (SINGULAIR) 10 MG tablet TAKE 1 TABLET(10 MG) BY MOUTH AT BEDTIME   Multiple Vitamins-Minerals (HAIR SKIN AND NAILS FORMULA PO) Take 1 tablet by mouth daily.   nitroGLYCERIN (NITROSTAT) 0.4 MG SL tablet DISSOLVE 1 TABLET UNDER THE TONGUE EVERY 5 MINUTES AS  NEEDED FOR CHEST PAIN. MAX  OF 3 TAB 15 MINUTES. CALL 911 IF PAIN PERSISTS.   omeprazole (PRILOSEC) 20 MG capsule TAKE 1 CAPSULE BY MOUTH TWICE DAILY BEFORE A MEAL   raloxifene (EVISTA) 60 MG tablet TAKE 1 TABLET(60 MG) BY MOUTH DAILY   Semaglutide,0.25 or 0.5MG /DOS, (OZEMPIC, 0.25 OR 0.5 MG/DOSE,) 2 MG/1.5ML SOPN Inject 0.5 mg into the skin once a week.   solifenacin (VESICARE) 10 MG tablet TAKE 1 TABLET(10 MG) BY MOUTH DAILY   zinc gluconate 50 MG tablet Take 50 mg by mouth daily.   No facility-administered encounter medications on file as of 05/24/2021.    Patient Active Problem List   Diagnosis Date Noted   Precordial chest pain 03/03/2020   Controlled substance agreement signed 08/16/2018   Myalgia 11/24/2017   Iron deficiency anemia 05/26/2017   Vitamin B 12 deficiency 05/26/2017   Obesity (BMI 30-39.9) 05/25/2017   Constipation 05/19/2016   Hypokalemia 04/30/2015   Depression 07/18/2014   Diabetic neuropathy (HCC) 04/17/2014   OAB (overactive bladder) 04/17/2014   Peripheral neuropathy 04/17/2014   Hypothyroidism 12/27/2012   Osteopenia 05/30/2012   Type 2 diabetes mellitus (HCC) 05/21/2012   Asthma 05/21/2012   Chronic obstructive pulmonary disease (HCC) 05/21/2012   Gastroesophageal reflux disease 05/21/2012   Hyperlipidemia associated with type 2 diabetes mellitus (HCC)  01/26/2011   Hypertension associated with diabetes (HCC)    Anxiety    Chronic coronary artery disease     Conditions to be addressed/monitored:DMII  Care Plan : [facility] Care Plan  Updates made by Gwenith Daily, RN since 05/24/2021 12:00 AM     Problem: Chroic Disease Management Needs   Priority: High  Onset Date: 02/16/2021     Goal: Patient will Work with RN Care Manager Regarding Care Managmenet and Care Coordination Associated with DM, HTN, HLD, COPD, and Depression   Start Date: 02/16/2021  Expected End Date: 02/16/2022  This Visit's Progress: On track  Recent Progress: On track  Priority: High  Note:   Current Barriers:  Chronic Disease Management support and education needs related to HTN, HLD, COPD, DMII, and depression  RNCM Clinical Goal(s):  Patient will continue to work with RN Care Manager and/or Social Worker to address care management and care coordination needs  related to HTN, HLD, COPD, DMII, and Depression as evidenced by adherence to CM Team Scheduled appointments     through collaboration with RN Care manager, provider, and care team.   Interventions: 1:1 collaboration with primary care provider regarding development and update of comprehensive plan of care as evidenced by provider attestation and co-signature Inter-disciplinary care team collaboration (see longitudinal plan of care) Evaluation of current treatment plan related to  self management and patient's adherence to plan as established by provider   COPD: (Status: Condition stable. Not addressed this visit.) Long Term Goal  Reviewed medications with patient, including use of prescribed maintenance and rescue inhalers, and provided instruction on medication management and the importance of adherence Provided patient with basic written and verbal COPD education on self care/management/and exacerbation prevention Advised patient to track and manage COPD triggers Provided written and verbal instructions  on pursed lip breathing and utilized returned demonstration as teach back Provided instruction about proper use of medications used for management of COPD including inhalers Advised patient to engage in light exercise as tolerated 3-5 days a week to aid in the the management of COPD Discussed the importance of adequate rest and management of fatigue with COPD Assessed social determinant of health barriers  Diabetes:  (Status: Goal on Track (progressing): YES.) Long Term Goal  Lab Results  Component Value Date   HGBA1C 6.6 (H) 05/14/2021   HGBA1C 6.4 (H) 01/12/2021   HGBA1C 6.5 07/20/2020   Lab Results  Component Value Date   MICROALBUR 20 04/17/2014   LDLCALC 32 05/14/2021   CREATININE 1.20 (H) 05/14/2021  Assessed patient's understanding of A1c goal: <7% Reviewed and discussed medications No problems with cost because she has Medicare Extra Help  Provided verbal education on how to inject ozempic and walked patient through injection while on the telephone. Patient was able to successfully inject the medication. She didn't realize there was a smaller cap directly on the needle.  Chart reviewed including relevant office notes and lab results Discussed home blood sugar testing Testing twice a day and as needed Improved readings with medication changes Encouraged patient to continue checking blood sugar twice a day and PRN and to write the results down in a log Advised to call PCP with any readings over 300 or with several readings in a row over 200 Advised to bring log to PCP appointment for review Reinforced carb modified diet and verbal education provided on what that is Encouraged to continue getting outside daily for a walk when the weather permits Provided with RNCM contact number and encouraged to reach out as needed   Hyperlipidemia:  (Status: Condition stable. Not addressed this visit.) Long Term Goal  Lab Results  Component Value Date   CHOL 267 (H) 03/19/2019   HDL 58  03/19/2019   LDLCALC 189 (H) 03/19/2019   TRIG 112 03/19/2019   CHOLHDL 4.6 (H) 03/19/2019    Mediations reviewed and importance of compliance discussed Using Repatha injection for HLD control due to statin intolerance  Provider established cholesterol goals reviewed; Counseled on importance of regular laboratory monitoring as prescribed; Reviewed exercise goals and target of 150 minutes per week; Assessed social determinant of health barriers;  Advised that lipid panel is overdue  Appt notes added to next PCP appointment that lipid panel is needed  Hypertension: (Status: Condition stable. Not addressed this visit.) Last practice recorded BP readings:  BP Readings from Last 3 Encounters:  01/12/21 (!) 143/82  12/10/20 (!) 142/63  10/22/20 139/76  Most  recent eGFR/CrCl:  Lab Results  Component Value Date   EGFR 59 (L) 01/12/2021    No components found for: CRCL  Evaluation of current treatment plan related to hypertension self management and patient's adherence to plan as established by provider;   Reviewed medications with patient and discussed importance of compliance;  Counseled on the importance of exercise goals with target of 150 minutes per week Discussed plans with patient for ongoing care management follow up and provided patient with direct contact information for care management team; Discussed complications of poorly controlled blood pressure such as heart disease, stroke, circulatory complications, vision complications, kidney impairment, sexual dysfunction;  Assessed social determinant of health barriers;   Patient Goals/Self-Care Activities: Take medications as prescribed   Attend all scheduled provider appointments Perform all self care activities independently  Call provider office for new concerns or questions  check blood sugar at prescribed times: twice daily and when you have symptoms of low or high blood sugar check feet daily for cuts, sores or  redness read food labels for fat, fiber, carbohydrates and portion size Review instructions for how to administer ozempic. Make sure to take the small cap off before you try to inject it. Count to 6 slowly after the window turns back to "0" Call RN Care Manager as needed [phone]  Plan:Telephone follow up appointment with care management team member scheduled for:  05/31/21 with RNCM The patient has been provided with contact information for the care management team and has been advised to call with any health related questions or concerns.   Demetrios Loll, BSN, RN-BC Embedded Chronic Care Manager [facility] Care Management Direct Dial: [phone]

## 2021-05-24 NOTE — Patient Instructions (Signed)
Visit Information ? ?Patient Goals/Self-Care Activities: ?Take medications as prescribed   ?Attend all scheduled provider appointments ?Perform all self care activities independently  ?Call provider office for new concerns or questions  ?check blood sugar at prescribed times: twice daily and when you have symptoms of low or high blood sugar ?check feet daily for cuts, sores or redness ?read food labels for fat, fiber, carbohydrates and portion size ?Review instructions for how to administer ozempic. Make sure to take the small cap off before you try to inject it. Count to 6 slowly after the window turns back to "0" ?Call RN Care Manager as needed [phone] ? ?The patient verbalized understanding of instructions, educational materials, and care plan provided today and declined offer to receive copy of patient instructions, educational materials, and care plan.  ? ?Plan:Telephone follow up appointment with care management team member scheduled for:  05/31/21 with RNCM ?The patient has been provided with contact information for the care management team and has been advised to call with any health related questions or concerns.  ? ?Chong Sicilian, BSN, RN-BC ?Embedded Chronic Care Manager ?[facility] ?Direct Dial: [phone] ?  ?

## 2021-05-28 DIAGNOSIS — E1169 Type 2 diabetes mellitus with other specified complication: Secondary | ICD-10-CM | POA: Diagnosis not present

## 2021-05-28 DIAGNOSIS — I2510 Atherosclerotic heart disease of native coronary artery without angina pectoris: Secondary | ICD-10-CM

## 2021-05-28 DIAGNOSIS — J449 Chronic obstructive pulmonary disease, unspecified: Secondary | ICD-10-CM

## 2021-05-28 DIAGNOSIS — E1159 Type 2 diabetes mellitus with other circulatory complications: Secondary | ICD-10-CM

## 2021-05-28 DIAGNOSIS — E1142 Type 2 diabetes mellitus with diabetic polyneuropathy: Secondary | ICD-10-CM

## 2021-05-28 DIAGNOSIS — E1149 Type 2 diabetes mellitus with other diabetic neurological complication: Secondary | ICD-10-CM

## 2021-05-28 DIAGNOSIS — I152 Hypertension secondary to endocrine disorders: Secondary | ICD-10-CM

## 2021-05-28 DIAGNOSIS — E785 Hyperlipidemia, unspecified: Secondary | ICD-10-CM

## 2021-05-28 DIAGNOSIS — F331 Major depressive disorder, recurrent, moderate: Secondary | ICD-10-CM

## 2021-05-31 ENCOUNTER — Telehealth: Payer: Self-pay | Admitting: *Deleted

## 2021-05-31 ENCOUNTER — Ambulatory Visit (INDEPENDENT_AMBULATORY_CARE_PROVIDER_SITE_OTHER): Payer: Medicare Other | Admitting: *Deleted

## 2021-05-31 DIAGNOSIS — E1159 Type 2 diabetes mellitus with other circulatory complications: Secondary | ICD-10-CM

## 2021-05-31 DIAGNOSIS — E1149 Type 2 diabetes mellitus with other diabetic neurological complication: Secondary | ICD-10-CM

## 2021-05-31 DIAGNOSIS — I152 Hypertension secondary to endocrine disorders: Secondary | ICD-10-CM

## 2021-05-31 DIAGNOSIS — E1142 Type 2 diabetes mellitus with diabetic polyneuropathy: Secondary | ICD-10-CM

## 2021-05-31 MED ORDER — ACCU-CHEK GUIDE VI STRP: ORAL_STRIP | 3 refills | Status: DC

## 2021-05-31 NOTE — Telephone Encounter (Signed)
05/31/2021 ? ?Patient requesting a 90 day supply of glucose test strips. Recently had a script sent to [facility] for #100 with 12 refills but she's having to pick those up every month since she tests 3-4 times a day.  ? ?Forwarding to [facility] Clinical Staff for assistance.  ? ?Chong Sicilian, BSN, RN-BC ?Embedded Chronic Care Manager ?[place] [facility] [place] [facility] ?Direct Dial: [phone] ? ?

## 2021-05-31 NOTE — Telephone Encounter (Signed)
Pt aware refill sent to pharmacy 

## 2021-06-01 NOTE — Addendum Note (Signed)
Addended by: Cynda Acres A on: 06/01/2021 11:30 AM   Modules accepted: Orders

## 2021-06-02 ENCOUNTER — Other Ambulatory Visit: Payer: Medicare Other

## 2021-06-02 ENCOUNTER — Other Ambulatory Visit: Payer: Self-pay | Admitting: Family

## 2021-06-02 DIAGNOSIS — E538 Deficiency of other specified B group vitamins: Secondary | ICD-10-CM | POA: Diagnosis not present

## 2021-06-02 DIAGNOSIS — D509 Iron deficiency anemia, unspecified: Secondary | ICD-10-CM

## 2021-06-02 LAB — CBC WITH DIFFERENTIAL/PLATELET
Basophils Absolute: 0.1 10*3/uL (ref 0.0–0.2)
Basos: 1 %
EOS (ABSOLUTE): 0.5 10*3/uL — ABNORMAL HIGH (ref 0.0–0.4)
Eos: 6 %
Hematocrit: 45.1 % (ref 34.0–46.6)
Hemoglobin: 15.2 g/dL (ref 11.1–15.9)
Immature Grans (Abs): 0 10*3/uL (ref 0.0–0.1)
Immature Granulocytes: 0 %
Lymphocytes Absolute: 2.7 10*3/uL (ref 0.7–3.1)
Lymphs: 36 %
MCH: 28.7 pg (ref 26.6–33.0)
MCHC: 33.7 g/dL (ref 31.5–35.7)
MCV: 85 fL (ref 79–97)
Monocytes Absolute: 0.6 10*3/uL (ref 0.1–0.9)
Monocytes: 9 %
Neutrophils Absolute: 3.6 10*3/uL (ref 1.4–7.0)
Neutrophils: 48 %
Platelets: 231 10*3/uL (ref 150–450)
RBC: 5.3 x10E6/uL — ABNORMAL HIGH (ref 3.77–5.28)
RDW: 12.8 % (ref 11.7–15.4)
WBC: 7.5 10*3/uL (ref 3.4–10.8)

## 2021-06-03 ENCOUNTER — Inpatient Hospital Stay (HOSPITAL_COMMUNITY): Payer: Medicare Other

## 2021-06-03 LAB — FERRITIN: Ferritin: 98 ng/mL (ref 15–150)

## 2021-06-03 LAB — IRON AND TIBC
Iron Saturation: 22 % (ref 15–55)
Iron: 68 ug/dL (ref 27–139)
Total Iron Binding Capacity: 315 ug/dL (ref 250–450)
UIBC: 247 ug/dL (ref 118–369)

## 2021-06-05 LAB — VITAMIN B12: Vitamin B-12: >2000 pg/mL — ABNORMAL HIGH (ref 232–1245)

## 2021-06-05 LAB — METHYLMALONIC ACID, SERUM: Methylmalonic Acid: 118 nmol/L (ref 0–378)

## 2021-06-08 ENCOUNTER — Telehealth: Payer: Self-pay | Admitting: *Deleted

## 2021-06-08 DIAGNOSIS — H25813 Combined forms of age-related cataract, bilateral: Secondary | ICD-10-CM | POA: Diagnosis not present

## 2021-06-08 DIAGNOSIS — H02831 Dermatochalasis of right upper eyelid: Secondary | ICD-10-CM | POA: Diagnosis not present

## 2021-06-08 DIAGNOSIS — E119 Type 2 diabetes mellitus without complications: Secondary | ICD-10-CM | POA: Diagnosis not present

## 2021-06-08 DIAGNOSIS — H4322 Crystalline deposits in vitreous body, left eye: Secondary | ICD-10-CM | POA: Diagnosis not present

## 2021-06-08 DIAGNOSIS — H02834 Dermatochalasis of left upper eyelid: Secondary | ICD-10-CM | POA: Diagnosis not present

## 2021-06-08 DIAGNOSIS — H43812 Vitreous degeneration, left eye: Secondary | ICD-10-CM | POA: Diagnosis not present

## 2021-06-08 LAB — HM DIABETES EYE EXAM

## 2021-06-08 MED ORDER — MISOPROSTOL 200 MCG PO TABS: ORAL_TABLET | ORAL | 1 refills | Status: DC

## 2021-06-08 NOTE — Telephone Encounter (Signed)
Called and left message please get patient scheduled for DM management with [facility] or Almyra Free please schedule when called back ?

## 2021-06-08 NOTE — Telephone Encounter (Signed)
Yes, I would recommend her being seen earlier to discussed DM management. Looks like she has seen Rhame before. Ok to set up appt with Almyra Free if she has more availability than PCP ?

## 2021-06-08 NOTE — Telephone Encounter (Signed)
TC from [facility] ?Pt came in today to get refills on her Metformin & Jardiance, pt has refills on file but they are too early to fill ?Pt states that occasionally she has to take 3 of her Metformin and 2 of her Jardiance because her sugars run in the 300s ?Please advise on this, should the patient be seen sooner than June to go over her medications and what she needs to do when her BS run high, any changes in her medications ?Pt would also like a 90d script for her Misoprostol, it is written for 30d supply w/ refills, I have sent this into the pharmacy, pt has limited access to get to the pharmacy and this will help. ?

## 2021-06-10 NOTE — Progress Notes (Signed)
? ?Virtual Visit via Telephone Note ?[place] ? ?I connected with Denise Gardner  on 06/11/21 at 10:12 AM by telephone and verified that I am speaking with the correct person using two identifiers. ? ?Location: ?Patient: Home ?Provider: [facility] ?  ?I discussed the limitations, risks, security and privacy concerns of performing an evaluation and management service by telephone and the availability of in person appointments. I also discussed with the patient that there may be a patient responsible charge related to this service. The patient expressed understanding and agreed to proceed. ? ? 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  ?PRIOR THERAPY: None ?  ?CURRENT THERAPY: Intermittent IV iron (last Feraheme on 07/15/2017 & 07/12/2017) ?  ?INTERVAL HISTORY: Denise Gardner 76 y.o. follows at our clinic for her normocytic anemia, secondary to CKD, iron deficiency, and B12 deficiency.  She was last seen by Tarri Abernethy PA-C on 12/10/2020. ? ?She reports that overall, she is feeling somewhat poorly today due to nausea.  She reports dark stool from iron supplementation but denies any melena or bright red blood per rectum.  She has low energy, and wonders if her medications are contributing to her chronic fatigue.  She denies any pica or restless legs. ?She is taking her iron tablet every other day with stool softener.  She has been taking vitamin B12 500 mcg daily.  She has little energy and no appetite.  She reports that she is maintaining stable weight at this time. ? ?  ?OBSERVATIONS/OBJECTIVE: ?Review of Systems  ?Constitutional:  Positive for malaise/fatigue. Negative for chills, diaphoresis, fever and weight loss.  ?Respiratory:  Positive for cough and shortness of breath.   ?Cardiovascular:  Positive for chest pain (occasional). Negative for palpitations.  ?Gastrointestinal:  Positive for  constipation and nausea. Negative for abdominal pain, blood in stool, melena and vomiting.  ?Neurological:  Positive for headaches. Negative for dizziness.  ?Psychiatric/Behavioral:  The patient has insomnia.    ? ?PHYSICAL EXAM (per limitations of virtual telephone visit): The patient is alert and oriented x 3, exhibiting adequate mentation, good mood, and ability to speak in full sentences and execute sound judgement. ? ? ?ASSESSMENT & PLAN: ?1.  Normocytic anemia with iron deficiency, B12 deficiency, and CKD 3 ?- Patient was previously anemic (hemoglobin as low as 10.4 on 05/25/2017), secondary to iron deficiency, CKD 3, and B12 deficiency.   ?- After repleting her iron and nutritional deficiencies, her blood count has been in the normal range over the past 2 years.   ?- She last had Feraheme on 07/05/2017 and 07/12/2017. ?-She has been taking ferrous sulfate every other day along with stool softener  ?- She has been taking B12 500 mcg daily   ?- Most recent labs (06/02/2021): CBC normal with Hgb 15.2, ferritin 98, iron saturation 22 %.  Elevated B12 > 2000, normal methylmalonic acid ?- She denies any bleeding per rectum or melena    ?- She has significant fatigue, weakness, and headaches ?-- Discussed with patient that her myriad of symptoms is unrelated to her history of iron deficiency anemia and recommended that she follow-up with her primary care provider for further input ?- PLAN: No indication for IV iron at this time. ?- Patient can STOP taking vitamin B12 supplement ?- Continue ferrous sulfate every other day ?- Repeat labs and RTC in 6 months for PHONE visit ? ?  ?I discussed the  assessment and treatment plan with the patient. The patient was provided an opportunity to ask questions and all were answered. The patient agreed with the plan and demonstrated an understanding of the instructions. ?  ?The patient was advised to call back or seek an in-person evaluation if the symptoms worsen or if the condition  fails to improve as anticipated. ? ?I provided 12 minutes of non-face-to-face time during this encounter. ? ? ?Harriett Rush, PA-C ?06/11/2021 10:25 AM ?

## 2021-06-11 ENCOUNTER — Inpatient Hospital Stay (HOSPITAL_COMMUNITY): Payer: Medicare Other | Attending: Physician Assistant | Admitting: Physician Assistant

## 2021-06-11 DIAGNOSIS — D509 Iron deficiency anemia, unspecified: Secondary | ICD-10-CM

## 2021-06-11 DIAGNOSIS — E538 Deficiency of other specified B group vitamins: Secondary | ICD-10-CM

## 2021-06-14 ENCOUNTER — Other Ambulatory Visit: Payer: Self-pay | Admitting: Family

## 2021-06-14 DIAGNOSIS — E1142 Type 2 diabetes mellitus with diabetic polyneuropathy: Secondary | ICD-10-CM

## 2021-06-14 DIAGNOSIS — E1149 Type 2 diabetes mellitus with other diabetic neurological complication: Secondary | ICD-10-CM

## 2021-06-15 ENCOUNTER — Ambulatory Visit (INDEPENDENT_AMBULATORY_CARE_PROVIDER_SITE_OTHER): Payer: Medicare Other | Admitting: Family

## 2021-06-15 ENCOUNTER — Encounter: Payer: Self-pay | Admitting: Family

## 2021-06-15 VITALS — BP 110/72 | HR 98 | Temp 97.1°F | Ht 65.0 in | Wt 186.8 lb

## 2021-06-15 DIAGNOSIS — E1159 Type 2 diabetes mellitus with other circulatory complications: Secondary | ICD-10-CM

## 2021-06-15 DIAGNOSIS — R112 Nausea with vomiting, unspecified: Secondary | ICD-10-CM

## 2021-06-15 DIAGNOSIS — I152 Hypertension secondary to endocrine disorders: Secondary | ICD-10-CM | POA: Diagnosis not present

## 2021-06-15 DIAGNOSIS — E1142 Type 2 diabetes mellitus with diabetic polyneuropathy: Secondary | ICD-10-CM | POA: Diagnosis not present

## 2021-06-15 DIAGNOSIS — R1084 Generalized abdominal pain: Secondary | ICD-10-CM

## 2021-06-15 MED ORDER — ONDANSETRON HCL 4 MG PO TABS: 4.0000 mg | ORAL_TABLET | Freq: Three times a day (TID) | ORAL | 2 refills | Status: DC | PRN

## 2021-06-15 MED ORDER — EMPAGLIFLOZIN 25 MG PO TABS: ORAL_TABLET | ORAL | 3 refills | Status: DC

## 2021-06-15 NOTE — Patient Instructions (Signed)

## 2021-06-15 NOTE — Progress Notes (Signed)
? ?Subjective:  ? ? Patient ID: Denise Gardner, female    DOB: Oct 05, 1945, 76 y.o.   MRN: [id number] ? ?Chief Complaint  ?Patient presents with  ? Diabetes  ?  Discuss meds per telephone call with tiffany   ? ?Pt presents to the office today to discuss Ozempic. States since starting Ozempic she has been having nausea, vomiting, headache, and abdominal pain. She was suppose to take it yesterday, but held it. States she continues to feel nauseous.  ?Diabetes ?She presents for her follow-up diabetic visit. She has type 2 diabetes mellitus. Associated symptoms include blurred vision and foot paresthesias. Symptoms are stable. Diabetic complications include heart disease and peripheral neuropathy. Risk factors for coronary artery disease include dyslipidemia, diabetes mellitus, hypertension, sedentary lifestyle and post-menopausal. She is following a generally unhealthy diet. Her overall blood glucose range is 130-140 mg/dl. Eye exam is not current.  ?Hypertension ?This is a chronic problem. The current episode started more than 1 year ago. The problem has been resolved since onset. The problem is controlled. Associated symptoms include blurred vision, malaise/fatigue, peripheral edema and shortness of breath. Risk factors for coronary artery disease include dyslipidemia, diabetes mellitus, obesity and sedentary lifestyle. The current treatment provides moderate improvement.  ? ? ? ?Review of Systems  ?Constitutional:  Positive for malaise/fatigue.  ?Eyes:  Positive for blurred vision.  ?Respiratory:  Positive for shortness of breath.   ?All other systems reviewed and are negative. ? ?   ?Objective:  ? Physical Exam ?Vitals reviewed.  ?Constitutional:   ?   General: She is not in acute distress. ?   Appearance: She is well-developed. She is obese.  ?HENT:  ?   Head: Normocephalic and atraumatic.  ?Eyes:  ?   Pupils: Pupils are equal, round, and reactive to light.  ?Neck:  ?   Thyroid: No thyromegaly.  ?Cardiovascular:  ?    Rate and Rhythm: Normal rate and regular rhythm.  ?   Heart sounds: Normal heart sounds. No murmur heard. ?Pulmonary:  ?   Effort: Pulmonary effort is normal. No respiratory distress.  ?   Breath sounds: Normal breath sounds. No wheezing.  ?Abdominal:  ?   General: Bowel sounds are normal. There is no distension.  ?   Palpations: Abdomen is soft.  ?   Tenderness: There is no abdominal tenderness.  ?Musculoskeletal:     ?   General: No tenderness. Normal range of motion.  ?   Cervical back: Normal range of motion and neck supple.  ?   Right lower leg: Edema (trace) present.  ?   Left lower leg: Edema (trace) present.  ?Skin: ?   General: Skin is warm and dry.  ?Neurological:  ?   Mental Status: She is alert and oriented to person, place, and time.  ?   Cranial Nerves: No cranial nerve deficit.  ?   Deep Tendon Reflexes: Reflexes are normal and symmetric.  ?Psychiatric:     ?   Behavior: Behavior normal.     ?   Thought Content: Thought content normal.     ?   Judgment: Judgment normal.  ? ? ?BP 110/72   Pulse 98   Temp (!) 97.1 ?F (36.2 ?C) (Temporal)   Ht '5\' 5"'$  (1.651 m)   Wt 186 lb 12.8 oz (84.7 kg)   BMI 31.09 kg/m?  ? ?   ?Assessment & Plan:  ?Zniya Cottone comes in today with chief complaint of Diabetes (Discuss meds per telephone call with tiffany ) ? ? ?  Diagnosis and orders addressed: ? ?1. Hypertension associated with diabetes ([place]) ? ?2. Type 2 diabetes mellitus with diabetic polyneuropathy, without long-term current use of insulin ([place]) ?Stop Ozempic, A1C was at goal last drawn ?Follow up in 2 months ?- empagliflozin (JARDIANCE) 25 MG TABS tablet; TAKE 1 TABLET(25 MG) BY MOUTH DAILY BEFORE AND BREAKFAST  Dispense: 90 tablet; Refill: 3 ?- ondansetron (ZOFRAN) 4 MG tablet; Take 1 tablet (4 mg total) by mouth every 8 (eight) hours as needed for nausea or vomiting.  Dispense: 20 tablet; Refill: 2 ? ?3. Nausea and vomiting, unspecified vomiting type ?Start Zofran ?Bland diet ?- ondansetron (ZOFRAN) 4 MG  tablet; Take 1 tablet (4 mg total) by mouth every 8 (eight) hours as needed for nausea or vomiting.  Dispense: 20 tablet; Refill: 2 ? ?4. Generalized abdominal pain ? ? ?Labs pending ?Health Maintenance reviewed ?Diet and exercise encouraged ? ?Follow up plan: ?2 months  ? ?Evelina Dun, FNP ? ?

## 2021-06-21 ENCOUNTER — Ambulatory Visit: Payer: Medicare Other

## 2021-06-22 ENCOUNTER — Ambulatory Visit: Payer: Medicare Other | Admitting: Licensed Clinical Social Worker

## 2021-06-22 DIAGNOSIS — F3342 Major depressive disorder, recurrent, in full remission: Secondary | ICD-10-CM

## 2021-06-22 DIAGNOSIS — E1169 Type 2 diabetes mellitus with other specified complication: Secondary | ICD-10-CM

## 2021-06-22 DIAGNOSIS — J449 Chronic obstructive pulmonary disease, unspecified: Secondary | ICD-10-CM

## 2021-06-22 DIAGNOSIS — I2510 Atherosclerotic heart disease of native coronary artery without angina pectoris: Secondary | ICD-10-CM

## 2021-06-22 DIAGNOSIS — E1159 Type 2 diabetes mellitus with other circulatory complications: Secondary | ICD-10-CM

## 2021-06-22 DIAGNOSIS — I152 Hypertension secondary to endocrine disorders: Secondary | ICD-10-CM

## 2021-06-22 DIAGNOSIS — R112 Nausea with vomiting, unspecified: Secondary | ICD-10-CM

## 2021-06-22 NOTE — Patient Instructions (Addendum)
Visit Information ? ?Patient goals:  Protect My Health. Complete ADLs. Manage Depression issues ? ?Timeframe:  Short-Term Goal ?Priority:  Medium ?Progress:  On Track ?Start Date:    04/28/21                    ?Expected End Date:  09/20/21                           ? ?Follow Up Date    08/09/21 at 1:00 PM     ?  ?Protect My Health: Complete ADLs. Manage Depression issues  ?  ?Why is this important?   ?Screening tests can find diseases early when they are easier to treat.  ?Your doctor or nurse will talk with you about which tests are important for you.  ?Getting shots for common diseases like the flu and shingles will help prevent them.   ?  ?Patient Coping Skills ?Has support from her brother ?Attends scheduled medical appointments ? ?Patient Deficits: ?Memory issues ?Financial challenges ?Some walking challenges ? ?Patient Goals: In  next 30 days, patient will: ?Attend scheduled medical appointments ?Call RNCM as needed to discuss nursing needs ?Communicate with LCSW as needed to discuss social work needs ?-  ?Follow Up Plan: LCSW to call client on 08/09/21 at 1:00 PM to assess client needs  ? ?Norva Riffle.Taner Rzepka MSW, LCSW ?Licensed Clinical Social Worker ?[place] [facility] ?[phone] ?

## 2021-06-22 NOTE — Progress Notes (Signed)
I    Cardiology Office Note   Date:  06/23/2021   ID:  Denise Gardner, DOB 1946/01/08, MRN [id number]  PCP:  Junie Spencer, FNP  Cardiologist:   Rollene Rotunda, MD  Chief Complaint  Patient presents with   Coronary Artery Disease      History of Present Illness: Denise Gardner is a 76 y.o. female who presents for evaluation of CAD.  I saw her last in 2014.  she had a stress perfusion study which demonstrated an EF of 71% and questionable inferior defect with questionable artifact vs infarct with mild periinfarct ischemia.    I saw her in 2019. She had chest discomfort and on remarkable perfusion study with no evidence of ischemia. She had chest pain and had a negative perfusion study in 2021.   Since I saw her she has had no new acute complaints.  She rarely gets chest discomfort and has to lay down still goes away but this has been a stable pattern since her stress test in 2021.  She denies any new shortness of breath, PND or orthopnea.  There have been no new palpitations, presyncope or syncope.  She said no new weight gain or edema.  She thinks she is doing a little bit better than she was previously  Past Medical History:  Diagnosis Date   Anxiety    depression   Asthma    Cataract    Chronic back pain    Coronary artery disease    Taxus stent to RCA 2008 2.5 x 16, non obstructive 15% proximal right coronary artery, patent curcumflex LAD, preserved LV   Diabetes mellitus without complication (HCC)    GERD (gastroesophageal reflux disease)    Hyperlipidemia    Hypertension    Insomnia    Migraine    Neuropathy    Shingles    Thyroid disease    Ulcer, stomach peptic     Past Surgical History:  Procedure Laterality Date   TOTAL ABDOMINAL HYSTERECTOMY       Current Outpatient Medications  Medication Sig Dispense Refill   acetaminophen (TYLENOL) 500 MG tablet Take 500 mg by mouth 2 (two) times daily. Pt takes 2 tablets at bedtime     amLODipine (NORVASC) 10 MG tablet  Take 1 tablet (10 mg total) by mouth daily. 90 tablet 1   Ascorbic Acid (VITAMIN C) 100 MG tablet Take 100 mg by mouth daily.     aspirin 81 MG chewable tablet Chew 81 mg by mouth daily.     Biotin 5000 MCG TABS Take by mouth.     cholecalciferol (VITAMIN D) 400 UNITS TABS Take by mouth. VITAMIN D3 daily      cyanocobalamin 100 MCG tablet Take 100 mcg by mouth daily.     diazepam (VALIUM) 5 MG tablet Take 1 tablet (5 mg total) by mouth 2 (two) times daily. Pt takes one tablet at daytime and one tablet at bedtime 30 tablet 2   empagliflozin (JARDIANCE) 25 MG TABS tablet TAKE 1 TABLET(25 MG) BY MOUTH DAILY BEFORE AND BREAKFAST 90 tablet 3   escitalopram (LEXAPRO) 10 MG tablet TAKE 1 TABLET(10 MG) BY MOUTH DAILY 90 tablet 3   Evolocumab (REPATHA SURECLICK) 140 MG/ML SOAJ ADMINISTER 1 ML UNDER THE SKIN EVERY 14 DAYS 2 mL 3   FLOVENT HFA 110 MCG/ACT inhaler INHALE 2 PUFFS INTO THE LUNGS TWICE DAILY 12 g 0   fluticasone (FLONASE) 50 MCG/ACT nasal spray SHAKE LIQUID AND USE 2 SPRAYS IN EACH NOSTRIL  DAILY 48 g 1   gabapentin (NEURONTIN) 300 MG capsule Take 300 mg in AM and 900 mg at bedtime 360 capsule 2   Garlic 10 MG CAPS Take by mouth.     hydrOXYzine (VISTARIL) 25 MG capsule TAKE 1 CAPSULE(25 MG) BY MOUTH THREE TIMES DAILY AS NEEDED 90 capsule 1   icosapent Ethyl (VASCEPA) 1 g capsule TAKE 4 CAPSULES BY MOUTH EVERY DAY 360 capsule 1   ipratropium-albuterol (DUONEB) 0.5-2.5 (3) MG/3ML SOLN USE 1 VIAL VIA NEBULIZER EVERY 6 HOURS 1080 mL 2   isosorbide mononitrate (IMDUR) 120 MG 24 hr tablet TAKE 1 TABLET(120 MG) BY MOUTH DAILY 90 tablet 3   JANUVIA 100 MG tablet Take 100 mg by mouth daily.     levothyroxine (SYNTHROID) 88 MCG tablet Take 1 tablet (88 mcg total) by mouth daily. 90 tablet 1   linaclotide (LINZESS) 72 MCG capsule TAKE 1 CAPSULE(72 MCG) BY MOUTH DAILY BEFORE AND BREAKFAST 90 capsule 3   meloxicam (MOBIC) 7.5 MG tablet TAKE 1 TABLET(7.5 MG) BY MOUTH DAILY 90 tablet 3   metFORMIN  (GLUCOPHAGE-XR) 500 MG 24 hr tablet TAKE 2 TABLETS(1000 MG) BY MOUTH DAILY WITH BREAKFAST 180 tablet 3   metoprolol tartrate (LOPRESSOR) 25 MG tablet TAKE 1 TABLET(25 MG) BY MOUTH TWICE DAILY 180 tablet 1   misoprostol (CYTOTEC) 200 MCG tablet TAKE 1 TABLET(200 MCG) BY MOUTH FOUR TIMES DAILY 360 tablet 1   montelukast (SINGULAIR) 10 MG tablet TAKE 1 TABLET(10 MG) BY MOUTH AT BEDTIME 90 tablet 1   Multiple Vitamins-Minerals (HAIR SKIN AND NAILS FORMULA PO) Take 1 tablet by mouth daily.     nitroGLYCERIN (NITROSTAT) 0.4 MG SL tablet DISSOLVE 1 TABLET UNDER THE TONGUE EVERY 5 MINUTES AS  NEEDED FOR CHEST PAIN. MAX  OF 3 TAB 15 MINUTES. CALL 911 IF PAIN PERSISTS. 100 tablet 3   omeprazole (PRILOSEC) 20 MG capsule TAKE 1 CAPSULE BY MOUTH TWICE DAILY BEFORE A MEAL 180 capsule 1   ondansetron (ZOFRAN) 4 MG tablet Take 1 tablet (4 mg total) by mouth every 8 (eight) hours as needed for nausea or vomiting. 20 tablet 2   raloxifene (EVISTA) 60 MG tablet TAKE 1 TABLET(60 MG) BY MOUTH DAILY 90 tablet 3   solifenacin (VESICARE) 10 MG tablet TAKE 1 TABLET(10 MG) BY MOUTH DAILY 90 tablet 1   zinc gluconate 50 MG tablet Take 50 mg by mouth daily.     Accu-Chek FastClix Lancets MISC CHECK BLOOD SUGAR UP TO FOUR TIMES DAILYDx E11.9 408 each 3   blood glucose meter kit and supplies Dispense based on patient and insurance preference. Use up to four times daily as directed. E11. Whatever insurance will cover 1 each 0   glucose blood (ACCU-CHEK GUIDE) test strip CHECK BLOOD SUGAR UP TO FOUR TIMES DAILY Dx E11.69 400 each 3   No current facility-administered medications for this visit.    Allergies:   Milk-related compounds, Eggs or egg-derived products, Lac bovis, Other, Statins, Crestor [rosuvastatin], and Ezetimibe   ROS:  Please see the history of present illness.   Otherwise, review of systems are positive for decreased balance and walks with a cane.   All other systems are reviewed and negative.    PHYSICAL  EXAM: VS:  BP 140/90   Pulse 70   Ht 5\' 5"  (1.651 m)   Wt 192 lb (87.1 kg)   BMI 31.95 kg/m  , BMI Body mass index is 31.95 kg/m. GENERAL:  Well appearing NECK:  No jugular venous  distention, waveform within normal limits, carotid upstroke brisk and symmetric, no bruits, no thyromegaly LUNGS:  Clear to auscultation bilaterally CHEST:  Unremarkable HEART:  PMI not displaced or sustained,S1 and S2 within normal limits, no S3, no S4, no clicks, no rubs, no murmurs ABD:  Flat, positive bowel sounds normal in frequency in pitch, no bruits, no rebound, no guarding, no midline pulsatile mass, no hepatomegaly, no splenomegaly EXT:  2 plus pulses throughout, no edema, no cyanosis no clubbing   EKG:  EKG is  ordered today. Rate 70, axis within normal limits, low voltage in the limb leads, poor anterior R wave progression  Recent Labs: 05/14/2021: ALT 13; BUN 22; Creatinine, Ser 1.20; Potassium 4.5; Sodium 141; TSH 0.220 06/02/2021: Hemoglobin 15.2; Platelets 231    Lipid Panel    Component Value Date/Time   CHOL 111 05/14/2021 1152   CHOL 136 09/05/2012 1256   TRIG 69 05/14/2021 1152   TRIG 43 12/28/2012 0938   TRIG 38 09/05/2012 1256   HDL 64 05/14/2021 1152   HDL 66 12/28/2012 0938   HDL 59 09/05/2012 1256   CHOLHDL 1.7 05/14/2021 1152   LDLCALC 32 05/14/2021 1152   LDLCALC 80 12/28/2012 0938   LDLCALC 69 09/05/2012 1256      Wt Readings from Last 3 Encounters:  06/23/21 192 lb (87.1 kg)  06/15/21 186 lb 12.8 oz (84.7 kg)  05/14/21 194 lb 12.8 oz (88.4 kg)      Other studies Reviewed: Additional studies/ records that were reviewed today include: Labs Review of the above records demonstrates:  Please see elsewhere in the note.     ASSESSMENT AND PLAN:  CHEST PAIN: The patient has no new sypmtoms.  No further cardiovascular testing is indicated.  We will continue with aggressive risk reduction and meds as listed.  HTN:  The blood pressure is controlled.  No change in  therapy.   DYSLIPIDEMIA:  Her LDL is 32 with an HDL of 64.  He is on Repatha.  Continue current therapy.   Current medicines are reviewed at length with the patient today.  The patient does not have concerns regarding medicines.  The following changes have been made:   None  Labs/ tests ordered today include:   None  Orders Placed This Encounter  Procedures   EKG 12-Lead     Disposition:   FU with me in 12 months    Signed, Rollene Rotunda, MD  06/23/2021 12:54 PM    [facility]

## 2021-06-22 NOTE — Chronic Care Management (AMB) (Signed)
Chronic Care Management    Clinical Social Work Note  06/22/2021 Name: Denise Gardner MRN: [id number] DOB: March 10, 1945  Denise Gardner is a 76 y.o. year old female who is a primary care patient of Junie Spencer, FNP. The CCM team was consulted to assist the patient with chronic disease management and/or care coordination needs related to: [employer] .   Engaged with patient by telephone for follow up visit in response to provider referral for social work chronic care management and care coordination services.   Consent to Services:  The patient was given information about Chronic Care Management services, agreed to services, and gave verbal consent prior to initiation of services.  Please see initial visit note for detailed documentation.   Patient agreed to services and consent obtained.   Assessment: Review of patient past medical history, allergies, medications, and health status, including review of relevant consultants reports was performed today as part of a comprehensive evaluation and provision of chronic care management and care coordination services.     SDOH (Social Determinants of Health) assessments and interventions performed:  SDOH Interventions    Flowsheet Row Most Recent Value  SDOH Interventions   Physical Activity Interventions Other (Comments)  [has some walking challenges. short of breath occasionally when walking. uses a cane or walker as needed]  Stress Interventions Provide Counseling  [client has stress related to managing medical needs]  Depression Interventions/Treatment  Counseling, Medication        Advanced Directives Status: See Vynca application for related entries.  CCM Care Plan  Allergies  Allergen Reactions   Milk-Related Compounds Itching and Nausea And Vomiting   Eggs Or Egg-Derived Products Itching, Nausea And Vomiting and Other (See Comments)    headaches   Lac Bovis Other (See Comments)    Per PCP office   Other Other (See  Comments)    Other reaction(s): Other (See Comments) Per [facility] Other reaction(s): Other (See Comments) Per [facility] Per [facility]   Statins    Crestor [Rosuvastatin]     Myalgias    Ezetimibe Other (See Comments)    mylagia Per PCP office    Outpatient Encounter Medications as of 06/22/2021  Medication Sig   Accu-Chek FastClix Lancets MISC CHECK BLOOD SUGAR UP TO FOUR TIMES DAILYDx E11.9   acetaminophen (TYLENOL) 500 MG tablet Take 500 mg by mouth 2 (two) times daily. Pt takes 2 tablets at bedtime   amLODipine (NORVASC) 10 MG tablet Take 1 tablet (10 mg total) by mouth daily.   Ascorbic Acid (VITAMIN C) 100 MG tablet Take 100 mg by mouth daily.   aspirin 81 MG chewable tablet Chew 81 mg by mouth daily.   Biotin 5000 MCG TABS Take by mouth.   blood glucose meter kit and supplies Dispense based on patient and insurance preference. Use up to four times daily as directed. E11. Whatever insurance will cover   cholecalciferol (VITAMIN D) 400 UNITS TABS Take by mouth. VITAMIN D3 daily    cyanocobalamin 100 MCG tablet Take 100 mcg by mouth daily.   diazepam (VALIUM) 5 MG tablet Take 1 tablet (5 mg total) by mouth 2 (two) times daily. Pt takes one tablet at daytime and one tablet at bedtime   empagliflozin (JARDIANCE) 25 MG TABS tablet TAKE 1 TABLET(25 MG) BY MOUTH DAILY BEFORE AND BREAKFAST   escitalopram (LEXAPRO) 10 MG tablet TAKE 1 TABLET(10 MG) BY MOUTH DAILY   Evolocumab (REPATHA SURECLICK) 140 MG/ML SOAJ ADMINISTER 1 ML UNDER  THE SKIN EVERY 14 DAYS   FLOVENT HFA 110 MCG/ACT inhaler INHALE 2 PUFFS INTO THE LUNGS TWICE DAILY   fluticasone (FLONASE) 50 MCG/ACT nasal spray SHAKE LIQUID AND USE 2 SPRAYS IN EACH NOSTRIL DAILY   gabapentin (NEURONTIN) 300 MG capsule Take 300 mg in AM and 900 mg at bedtime   Garlic 10 MG CAPS Take by mouth.   glucose blood (ACCU-CHEK GUIDE) test strip CHECK BLOOD SUGAR UP TO FOUR TIMES DAILY Dx E11.69   hydrOXYzine (VISTARIL) 25  MG capsule TAKE 1 CAPSULE(25 MG) BY MOUTH THREE TIMES DAILY AS NEEDED   icosapent Ethyl (VASCEPA) 1 g capsule TAKE 4 CAPSULES BY MOUTH EVERY DAY   ipratropium-albuterol (DUONEB) 0.5-2.5 (3) MG/3ML SOLN USE 1 VIAL VIA NEBULIZER EVERY 6 HOURS   isosorbide mononitrate (IMDUR) 120 MG 24 hr tablet TAKE 1 TABLET(120 MG) BY MOUTH DAILY   JANUVIA 100 MG tablet Take 100 mg by mouth daily.   levothyroxine (SYNTHROID) 88 MCG tablet Take 1 tablet (88 mcg total) by mouth daily.   linaclotide (LINZESS) 72 MCG capsule TAKE 1 CAPSULE(72 MCG) BY MOUTH DAILY BEFORE AND BREAKFAST   meloxicam (MOBIC) 7.5 MG tablet TAKE 1 TABLET(7.5 MG) BY MOUTH DAILY   metFORMIN (GLUCOPHAGE-XR) 500 MG 24 hr tablet TAKE 2 TABLETS(1000 MG) BY MOUTH DAILY WITH BREAKFAST   metoprolol tartrate (LOPRESSOR) 25 MG tablet TAKE 1 TABLET(25 MG) BY MOUTH TWICE DAILY   misoprostol (CYTOTEC) 200 MCG tablet TAKE 1 TABLET(200 MCG) BY MOUTH FOUR TIMES DAILY   montelukast (SINGULAIR) 10 MG tablet TAKE 1 TABLET(10 MG) BY MOUTH AT BEDTIME   Multiple Vitamins-Minerals (HAIR SKIN AND NAILS FORMULA PO) Take 1 tablet by mouth daily.   nitroGLYCERIN (NITROSTAT) 0.4 MG SL tablet DISSOLVE 1 TABLET UNDER THE TONGUE EVERY 5 MINUTES AS  NEEDED FOR CHEST PAIN. MAX  OF 3 TAB 15 MINUTES. CALL 911 IF PAIN PERSISTS.   omeprazole (PRILOSEC) 20 MG capsule TAKE 1 CAPSULE BY MOUTH TWICE DAILY BEFORE A MEAL   ondansetron (ZOFRAN) 4 MG tablet Take 1 tablet (4 mg total) by mouth every 8 (eight) hours as needed for nausea or vomiting.   raloxifene (EVISTA) 60 MG tablet TAKE 1 TABLET(60 MG) BY MOUTH DAILY   solifenacin (VESICARE) 10 MG tablet TAKE 1 TABLET(10 MG) BY MOUTH DAILY   zinc gluconate 50 MG tablet Take 50 mg by mouth daily.   No facility-administered encounter medications on file as of 06/22/2021.    Patient Active Problem List   Diagnosis Date Noted   Precordial chest pain 03/03/2020   Controlled substance agreement signed 08/16/2018   Myalgia 11/24/2017    Iron deficiency anemia 05/26/2017   Vitamin B 12 deficiency 05/26/2017   Obesity (BMI 30-39.9) 05/25/2017   Constipation 05/19/2016   Hypokalemia 04/30/2015   Depression 07/18/2014   Diabetic neuropathy (HCC) 04/17/2014   OAB (overactive bladder) 04/17/2014   Peripheral neuropathy 04/17/2014   Hypothyroidism 12/27/2012   Osteopenia 05/30/2012   Type 2 diabetes mellitus (HCC) 05/21/2012   Asthma 05/21/2012   Chronic obstructive pulmonary disease (HCC) 05/21/2012   Gastroesophageal reflux disease 05/21/2012   Hyperlipidemia associated with type 2 diabetes mellitus (HCC) 01/26/2011   Hypertension associated with diabetes (HCC)    Anxiety    Chronic coronary artery disease     Conditions to be addressed/monitored: monitor client completion of daily ADLs.  Care Plan : LCSW care plan  Updates made by Denise Blakes, LCSW since 06/22/2021 12:00 AM     Problem: Coping Skills (General  Plan of Care)      Goal: Coping Skills Enhanced; Manage ADLs; Manage Medical Conditions   Start Date: 04/28/2021  Expected End Date: 09/20/2021  This Visit's Progress: On track  Recent Progress: On track  Priority: Medium  Note:   Current barriers:   Patient in need of assistance with connecting to community resources for possible help with ADLs completion Edema issues Memory issues Mobility issues  Clinical Goals:  LCSW to communicate with client in next 30 days to discuss client completion of ADLs and client completion of daily activities LCSW to communicate with client in next 30 days to discuss pain issues of client and mobility issues of client Client to communicate in next 30 days with Dr. Vanice Sarah, [facility] Pharmacist, to discuss medication questions of client  Clinical Interventions:  Collaboration with Junie Spencer, FNP regarding development and update of comprehensive plan of care as evidenced by provider attestation and co-signature Assessment of needs, barriers of  client. Discussed with Jett pain issues of client Discussed with Anysha sleeping issues of client Reviewed with client her medication procurement. She said she has her prescribed medications. Reviewed client energy level. She said she is often fatigued and has low energy Reviewed with client her walking challenges. She said she uses a cane or a walker as needed to help her walk. She tries to do some walking exercises outside of her home.  Encouraged client to call RNCM as needed in next 30 days for CCM nursing support Discussed appetite issues.  Discussed client appointments. She said she has appointment with cardiologist tomorrow. Reviewed mood status. She said she thinks her mood is stable at present  Patient Coping Skills Has support from her brother Attends scheduled medical appointments  Patient Deficits: Memory issues Financial challenges Some walking challenges  Patient Goals: In  next 30 days, patient will: Attend scheduled medical appointments Call RNCM as needed to discuss nursing needs Communicate with LCSW as needed to discuss social work needs -  Follow Up Plan: LCSW to call client on 08/09/21 at 1:00 PM to assess client needs at that time.       Denise Gardner MSW, LCSW Licensed Visual merchandiser [facility] Care Management [phone]

## 2021-06-23 ENCOUNTER — Encounter: Payer: Self-pay | Admitting: Cardiology

## 2021-06-23 ENCOUNTER — Ambulatory Visit: Payer: Medicare Other | Admitting: Cardiology

## 2021-06-23 VITALS — BP 140/90 | HR 70 | Ht 65.0 in | Wt 192.0 lb

## 2021-06-23 DIAGNOSIS — E785 Hyperlipidemia, unspecified: Secondary | ICD-10-CM | POA: Diagnosis not present

## 2021-06-23 DIAGNOSIS — R072 Precordial pain: Secondary | ICD-10-CM | POA: Diagnosis not present

## 2021-06-23 DIAGNOSIS — I10 Essential (primary) hypertension: Secondary | ICD-10-CM | POA: Diagnosis not present

## 2021-06-23 NOTE — Patient Instructions (Signed)
Medication Instructions:  ?The current medical regimen is effective;  continue present plan and medications. ? ?*If you need a refill on your cardiac medications before your next appointment, please call your pharmacy* ? ?Follow-Up: ?At [facility], you and your health needs are our priority.  As part of our continuing mission to provide you with exceptional heart care, we have created designated Provider Care Teams.  These Care Teams include your primary Cardiologist (physician) and Advanced Practice Providers (APPs -  Physician Assistants and Nurse Practitioners) who all work together to provide you with the care you need, when you need it. ? ?We recommend signing up for the patient portal called "MyChart".  Sign up information is provided on this After Visit Summary.  MyChart is used to connect with patients for Virtual Visits (Telemedicine).  Patients are able to view lab/test results, encounter notes, upcoming appointments, etc.  Non-urgent messages can be sent to your provider as well.   ?To learn more about what you can do with MyChart, go to [email or web address].   ? ?Your next appointment:   ?1 year(s) ? ?The format for your next appointment:   ?In Person ? ?Provider:   ?Minus Breeding, MD{ ? ? ?Thank you for choosing [facility]!! ? ? ? ? ?Important Information About Sugar ? ? ? ? ?  ?

## 2021-06-27 DIAGNOSIS — I152 Hypertension secondary to endocrine disorders: Secondary | ICD-10-CM | POA: Diagnosis not present

## 2021-06-27 DIAGNOSIS — E1149 Type 2 diabetes mellitus with other diabetic neurological complication: Secondary | ICD-10-CM

## 2021-06-27 DIAGNOSIS — E1169 Type 2 diabetes mellitus with other specified complication: Secondary | ICD-10-CM

## 2021-06-27 DIAGNOSIS — I2510 Atherosclerotic heart disease of native coronary artery without angina pectoris: Secondary | ICD-10-CM | POA: Diagnosis not present

## 2021-06-27 DIAGNOSIS — E785 Hyperlipidemia, unspecified: Secondary | ICD-10-CM | POA: Diagnosis not present

## 2021-06-27 DIAGNOSIS — E1159 Type 2 diabetes mellitus with other circulatory complications: Secondary | ICD-10-CM | POA: Diagnosis not present

## 2021-06-27 DIAGNOSIS — J449 Chronic obstructive pulmonary disease, unspecified: Secondary | ICD-10-CM | POA: Diagnosis not present

## 2021-06-27 DIAGNOSIS — F3342 Major depressive disorder, recurrent, in full remission: Secondary | ICD-10-CM

## 2021-06-28 ENCOUNTER — Ambulatory Visit (INDEPENDENT_AMBULATORY_CARE_PROVIDER_SITE_OTHER): Payer: Medicare Other

## 2021-06-28 VITALS — Ht 65.0 in | Wt 192.0 lb

## 2021-06-28 DIAGNOSIS — Z0000 Encounter for general adult medical examination without abnormal findings: Secondary | ICD-10-CM
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
## 2021-06-28 NOTE — Patient Instructions (Signed)
Denise Gardner , Thank you for taking time to come for your Medicare Wellness Visit. I appreciate your ongoing commitment to your health goals. Please review the following plan we discussed and let me know if I can assist you in the future.   Screening recommendations/referrals: Colonoscopy: Done 07/03/2015. No longer required.  Mammogram: Done 02/24/2021 Repeat annually  Bone Density: Done 07/24/2020 Repeat every 2 years  Recommended yearly ophthalmology/optometry visit for glaucoma screening and checkup Recommended yearly dental visit for hygiene and checkup  Vaccinations: Influenza vaccine: Done 12/29/2020 Repeat annually  Pneumococcal vaccine: Done 12/20/2013 and 10/22/2010. Tdap vaccine: Done 09/05/2012 Repeat in 10 years  Shingles vaccine: Done 11/29/2018 and 01/30/2019   Covid-19:Done 06/10/2019  Advanced directives: Advance directive discussed with you today. Even though you declined this today, please call our office should you change your mind, and we can give you the proper paperwork for you to fill out.   Conditions/risks identified: Aim for 30 minutes of exercise or brisk walking, 6-8 glasses of water, and 5 servings of fruits and vegetables each day.   Next appointment: Follow up in one year for your annual wellness visit 07/04/2022 @ 2:45 PM.   Preventive Care 65 Years and Older, Female Preventive care refers to lifestyle choices and visits with your health care provider that can promote health and wellness. What does preventive care include? A yearly physical exam. This is also called an annual well check. Dental exams once or twice a year. Routine eye exams. Ask your health care provider how often you should have your eyes checked. Personal lifestyle choices, including: Daily care of your teeth and gums. Regular physical activity. Eating a healthy diet. Avoiding tobacco and drug use. Limiting alcohol use. Practicing safe sex. Taking low-dose aspirin every day. Taking  vitamin and mineral supplements as recommended by your health care provider. What happens during an annual well check? The services and screenings done by your health care provider during your annual well check will depend on your age, overall health, lifestyle risk factors, and family history of disease. Counseling  Your health care provider may ask you questions about your: Alcohol use. Tobacco use. Drug use. Emotional well-being. Home and relationship well-being. Sexual activity. Eating habits. History of falls. Memory and ability to understand (cognition). Work and work Astronomer. Reproductive health. Screening  You may have the following tests or measurements: Height, weight, and BMI. Blood pressure. Lipid and cholesterol levels. These may be checked every 5 years, or more frequently if you are over 3 years old. Skin check. Lung cancer screening. You may have this screening every year starting at age 25 if you have a 30-pack-year history of smoking and currently smoke or have quit within the past 15 years. Fecal occult blood test (FOBT) of the stool. You may have this test every year starting at age 98. Flexible sigmoidoscopy or colonoscopy. You may have a sigmoidoscopy every 5 years or a colonoscopy every 10 years starting at age 6. Hepatitis C blood test. Hepatitis B blood test. Sexually transmitted disease (STD) testing. Diabetes screening. This is done by checking your blood sugar (glucose) after you have not eaten for a while (fasting). You may have this done every 1-3 years. Bone density scan. This is done to screen for osteoporosis. You may have this done starting at age 50. Mammogram. This may be done every 1-2 years. Talk to your health care provider about how often you should have regular mammograms. Talk with your health care provider about your test  results, treatment options, and if necessary, the need for more tests. Vaccines  Your health care provider may  recommend certain vaccines, such as: Influenza vaccine. This is recommended every year. Tetanus, diphtheria, and acellular pertussis (Tdap, Td) vaccine. You may need a Td booster every 10 years. Zoster vaccine. You may need this after age 65. Pneumococcal 13-valent conjugate (PCV13) vaccine. One dose is recommended after age 72. Pneumococcal polysaccharide (PPSV23) vaccine. One dose is recommended after age 17. Talk to your health care provider about which screenings and vaccines you need and how often you need them. This information is not intended to replace advice given to you by your health care provider. Make sure you discuss any questions you have with your health care provider. Document Released: 03/13/2015 Document Revised: 11/04/2015 Document Reviewed: 12/16/2014 Elsevier Interactive Patient Education  2017 [place] Prevention in the Home Falls can cause injuries. They can happen to people of all ages. There are many things you can do to make your home safe and to help prevent falls. What can I do on the outside of my home? Regularly fix the edges of walkways and driveways and fix any cracks. Remove anything that might make you trip as you walk through a door, such as a raised step or threshold. Trim any bushes or trees on the path to your home. Use bright outdoor lighting. Clear any walking paths of anything that might make someone trip, such as rocks or tools. Regularly check to see if handrails are loose or broken. Make sure that both sides of any steps have handrails. Any raised decks and porches should have guardrails on the edges. Have any leaves, snow, or ice cleared regularly. Use sand or salt on walking paths during winter. Clean up any spills in your garage right away. This includes oil or grease spills. What can I do in the bathroom? Use night lights. Install grab bars by the toilet and in the tub and shower. Do not use towel bars as grab bars. Use non-skid  mats or decals in the tub or shower. If you need to sit down in the shower, use a plastic, non-slip stool. Keep the floor dry. Clean up any water that spills on the floor as soon as it happens. Remove soap buildup in the tub or shower regularly. Attach bath mats securely with double-sided non-slip rug tape. Do not have throw rugs and other things on the floor that can make you trip. What can I do in the bedroom? Use night lights. Make sure that you have a light by your bed that is easy to reach. Do not use any sheets or blankets that are too big for your bed. They should not hang down onto the floor. Have a firm chair that has side arms. You can use this for support while you get dressed. Do not have throw rugs and other things on the floor that can make you trip. What can I do in the kitchen? Clean up any spills right away. Avoid walking on wet floors. Keep items that you use a lot in easy-to-reach places. If you need to reach something above you, use a strong step stool that has a grab bar. Keep electrical cords out of the way. Do not use floor polish or wax that makes floors slippery. If you must use wax, use non-skid floor wax. Do not have throw rugs and other things on the floor that can make you trip. What can I do with my stairs?  Do not leave any items on the stairs. Make sure that there are handrails on both sides of the stairs and use them. Fix handrails that are broken or loose. Make sure that handrails are as long as the stairways. Check any carpeting to make sure that it is firmly attached to the stairs. Fix any carpet that is loose or worn. Avoid having throw rugs at the top or bottom of the stairs. If you do have throw rugs, attach them to the floor with carpet tape. Make sure that you have a light switch at the top of the stairs and the bottom of the stairs. If you do not have them, ask someone to add them for you. What else can I do to help prevent falls? Wear shoes  that: Do not have high heels. Have rubber bottoms. Are comfortable and fit you well. Are closed at the toe. Do not wear sandals. If you use a stepladder: Make sure that it is fully opened. Do not climb a closed stepladder. Make sure that both sides of the stepladder are locked into place. Ask someone to hold it for you, if possible. Clearly mark and make sure that you can see: Any grab bars or handrails. First and last steps. Where the edge of each step is. Use tools that help you move around (mobility aids) if they are needed. These include: Canes. Walkers. Scooters. Crutches. Turn on the lights when you go into a dark area. Replace any light bulbs as soon as they burn out. Set up your furniture so you have a clear path. Avoid moving your furniture around. If any of your floors are uneven, fix them. If there are any pets around you, be aware of where they are. Review your medicines with your doctor. Some medicines can make you feel dizzy. This can increase your chance of falling. Ask your doctor what other things that you can do to help prevent falls. This information is not intended to replace advice given to you by your health care provider. Make sure you discuss any questions you have with your health care provider. Document Released: 12/11/2008 Document Revised: 07/23/2015 Document Reviewed: 03/21/2014 Elsevier Interactive Patient Education  2017 [employer].

## 2021-07-01 ENCOUNTER — Other Ambulatory Visit: Payer: Self-pay | Admitting: Family

## 2021-07-01 DIAGNOSIS — J309 Allergic rhinitis, unspecified: Secondary | ICD-10-CM

## 2021-07-09 ENCOUNTER — Other Ambulatory Visit: Payer: Self-pay | Admitting: Family

## 2021-07-09 DIAGNOSIS — M159 Polyosteoarthritis, unspecified: Secondary | ICD-10-CM

## 2021-07-26 ENCOUNTER — Other Ambulatory Visit: Payer: Self-pay | Admitting: Family

## 2021-07-26 DIAGNOSIS — F419 Anxiety disorder, unspecified: Secondary | ICD-10-CM

## 2021-08-04 NOTE — Patient Instructions (Signed)
Visit Information  Thank you for taking time to visit with me today. Please don't hesitate to contact me if I can be of assistance to you before our next scheduled telephone appointment.  Following are the goals we discussed today:  Take medications as prescribed   Attend all scheduled provider appointments Perform all self care activities independently  Call provider office for new concerns or questions  check blood sugar at prescribed times: twice daily and when you have symptoms of low or high blood sugar check feet daily for cuts, sores or redness read food labels for fat, fiber, carbohydrates and portion size Review instructions for how to administer ozempic. Make sure to take the small cap off before you try to inject it. Count to 6 slowly after the window turns back to "0" Call RN Care Manager as needed [phone]  Our next appointment is by telephone on 08/05/21 at 1:00  Please call the care guide team at [phone] if you need to cancel or reschedule your appointment.   If you are experiencing a Mental Health or [place] or need someone to talk to, please call the [facility]: [phone] call [phone]   The patient verbalized understanding of instructions, educational materials, and care plan provided today and DECLINED offer to receive copy of patient instructions, educational materials, and care plan.   Chong Sicilian, BSN, RN-BC Embedded Chronic Care Manager [facility] / [place] [facility] Direct Dial: [phone]

## 2021-08-04 NOTE — Chronic Care Management (AMB) (Signed)
Chronic Care Management   CCM RN Visit Note  05/31/2021  Name: Denise Gardner MRN: [id number] DOB: [birth year]  Subjective: Denise Gardner is a 76 y.o. year old female who is a primary care patient of Sharion Balloon, FNP. The care management team was consulted for assistance with disease management and care coordination needs.    Engaged with patient by telephone for follow up visit in response to provider referral for case management and/or care coordination services.   Consent to Services:  The patient was given information about Chronic Care Management services, agreed to services, and gave verbal consent prior to initiation of services.  Please see initial visit note for detailed documentation.   Patient agreed to services and verbal consent obtained.   Assessment: Review of patient past medical history, allergies, medications, health status, including review of consultants reports, laboratory and other test data, was performed as part of comprehensive evaluation and provision of chronic care management services.   SDOH (Social Determinants of Health) assessments and interventions performed:    CCM Care Plan  Allergies  Allergen Reactions   Milk-Related Compounds Itching and Nausea And Vomiting   Eggs Or Egg-Derived Products Itching, Nausea And Vomiting and Other (See Comments)    headaches   Lac Bovis Other (See Comments)    Per PCP office   Other Other (See Comments)    Other reaction(s): Other (See Comments) Per [facility] Other reaction(s): Other (See Comments) Per [facility] Per [facility]   Statins    Crestor [Rosuvastatin]     Myalgias    Ezetimibe Other (See Comments)    mylagia Per PCP office    Outpatient Encounter Medications as of 05/31/2021  Medication Sig   Accu-Chek FastClix Lancets MISC CHECK BLOOD SUGAR UP TO FOUR TIMES DAILYDx E11.9   acetaminophen (TYLENOL) 500 MG tablet Take 500 mg by mouth 2 (two) times daily. Pt takes 2 tablets  at bedtime   amLODipine (NORVASC) 10 MG tablet Take 1 tablet (10 mg total) by mouth daily.   Ascorbic Acid (VITAMIN C) 100 MG tablet Take 100 mg by mouth daily.   aspirin 81 MG chewable tablet Chew 81 mg by mouth daily.   Biotin 5000 MCG TABS Take by mouth.   blood glucose meter kit and supplies Dispense based on patient and insurance preference. Use up to four times daily as directed. E11. Whatever insurance will cover   cholecalciferol (VITAMIN D) 400 UNITS TABS Take by mouth. VITAMIN D3 daily    cyanocobalamin 100 MCG tablet Take 100 mcg by mouth daily.   diazepam (VALIUM) 5 MG tablet Take 1 tablet (5 mg total) by mouth 2 (two) times daily. Pt takes one tablet at daytime and one tablet at bedtime   escitalopram (LEXAPRO) 10 MG tablet TAKE 1 TABLET(10 MG) BY MOUTH DAILY   Evolocumab (REPATHA SURECLICK) 867 MG/ML SOAJ ADMINISTER 1 ML UNDER THE SKIN EVERY 14 DAYS   FLOVENT HFA 110 MCG/ACT inhaler INHALE 2 PUFFS INTO THE LUNGS TWICE DAILY   gabapentin (NEURONTIN) 300 MG capsule Take 300 mg in AM and 900 mg at bedtime   Garlic 10 MG CAPS Take by mouth.   hydrOXYzine (VISTARIL) 25 MG capsule TAKE 1 CAPSULE(25 MG) BY MOUTH THREE TIMES DAILY AS NEEDED   icosapent Ethyl (VASCEPA) 1 g capsule TAKE 4 CAPSULES BY MOUTH EVERY DAY   ipratropium-albuterol (DUONEB) 0.5-2.5 (3) MG/3ML SOLN USE 1 VIAL VIA NEBULIZER EVERY 6 HOURS   isosorbide mononitrate (IMDUR) 120 MG  24 hr tablet TAKE 1 TABLET(120 MG) BY MOUTH DAILY   levothyroxine (SYNTHROID) 88 MCG tablet Take 1 tablet (88 mcg total) by mouth daily.   linaclotide (LINZESS) 72 MCG capsule TAKE 1 CAPSULE(72 MCG) BY MOUTH DAILY BEFORE AND BREAKFAST   metFORMIN (GLUCOPHAGE-XR) 500 MG 24 hr tablet TAKE 2 TABLETS(1000 MG) BY MOUTH DAILY WITH BREAKFAST   metoprolol tartrate (LOPRESSOR) 25 MG tablet TAKE 1 TABLET(25 MG) BY MOUTH TWICE DAILY   montelukast (SINGULAIR) 10 MG tablet TAKE 1 TABLET(10 MG) BY MOUTH AT BEDTIME   Multiple Vitamins-Minerals (HAIR SKIN AND  NAILS FORMULA PO) Take 1 tablet by mouth daily.   nitroGLYCERIN (NITROSTAT) 0.4 MG SL tablet DISSOLVE 1 TABLET UNDER THE TONGUE EVERY 5 MINUTES AS  NEEDED FOR CHEST PAIN. MAX  OF 3 TAB 15 MINUTES. CALL 911 IF PAIN PERSISTS.   omeprazole (PRILOSEC) 20 MG capsule TAKE 1 CAPSULE BY MOUTH TWICE DAILY BEFORE A MEAL   raloxifene (EVISTA) 60 MG tablet TAKE 1 TABLET(60 MG) BY MOUTH DAILY   solifenacin (VESICARE) 10 MG tablet TAKE 1 TABLET(10 MG) BY MOUTH DAILY   zinc gluconate 50 MG tablet Take 50 mg by mouth daily.   [DISCONTINUED] empagliflozin (JARDIANCE) 25 MG TABS tablet TAKE 1 TABLET(25 MG) BY MOUTH DAILY BEFORE AND BREAKFAST   [DISCONTINUED] fluticasone (FLONASE) 50 MCG/ACT nasal spray SHAKE LIQUID AND USE 2 SPRAYS IN EACH NOSTRIL DAILY   [DISCONTINUED] glucose blood (ACCU-CHEK GUIDE) test strip CHECK BLOOD SUGAR UP TO FOUR TIMES DAILY Dx E11.69   [DISCONTINUED] meloxicam (MOBIC) 7.5 MG tablet TAKE 1 TABLET(7.5 MG) BY MOUTH DAILY   [DISCONTINUED] misoprostol (CYTOTEC) 200 MCG tablet TAKE 1 TABLET(200 MCG) BY MOUTH FOUR TIMES DAILY   [DISCONTINUED] Semaglutide,0.25 or 0.5MG/DOS, (OZEMPIC, 0.25 OR 0.5 MG/DOSE,) 2 MG/1.5ML SOPN Inject 0.5 mg into the skin once a week.   No facility-administered encounter medications on file as of 05/31/2021.    Patient Active Problem List   Diagnosis Date Noted   Precordial chest pain 03/03/2020   Controlled substance agreement signed 08/16/2018   Myalgia 11/24/2017   Iron deficiency anemia 05/26/2017   Vitamin B 12 deficiency 05/26/2017   Obesity (BMI 30-39.9) 05/25/2017   Constipation 05/19/2016   Hypokalemia 04/30/2015   Depression 07/18/2014   Diabetic neuropathy ([place]) 04/17/2014   OAB (overactive bladder) 04/17/2014   Peripheral neuropathy 04/17/2014   Hypothyroidism 12/27/2012   Osteopenia 05/30/2012   Type 2 diabetes mellitus ([place]) 05/21/2012   Asthma 05/21/2012   Chronic obstructive pulmonary disease ([place]) 05/21/2012   Gastroesophageal reflux  disease 05/21/2012   Hyperlipidemia associated with type 2 diabetes mellitus ([place]) 01/26/2011   Hypertension associated with diabetes ([place])    Anxiety    Chronic coronary artery disease     Conditions to be addressed/monitored:HTN, HLD, and DMII  Care Plan : [facility] Care Plan     Problem: [place] Disease Management Needs   Priority: High  Onset Date: 02/16/2021     Goal: Patient will Work with RN Care Manager Regarding [place] with DM, HTN, HLD, COPD, and Depression   Start Date: 02/16/2021  Expected End Date: 02/16/2022  This Visit's Progress: On track  Recent Progress: On track  Priority: High  Note:   Current Barriers:  Chronic Disease Management support and education needs related to HTN, HLD, COPD, DMII, and depression  RNCM Clinical Goal(s):  Patient will continue to work with RN Care Manager and/or Social Worker to address care management and care coordination needs related to  HTN, HLD, COPD, DMII, and Depression as evidenced by adherence to CM Team Scheduled appointments     through collaboration with RN Care manager, provider, and care team.   Interventions: 1:1 collaboration with primary care provider regarding development and update of comprehensive plan of care as evidenced by provider attestation and co-signature Inter-disciplinary care team collaboration (see longitudinal plan of care) Evaluation of current treatment plan related to  self management and patient's adherence to plan as established by provider   COPD: (Status: Goal on Track (progressing): YES.) Long Term Goal  Reviewed and discussed medications Advised patient to track and manage COPD triggers Advised patient to engage in light exercise as tolerated 3-5 days a week to aid in the the management of COPD Discussed the importance of adequate rest and management of fatigue with COPD Assessed social determinant of health barriers   Diabetes:  (Status: Goal on Track  (progressing): YES.) Long Term Goal  Lab Results  Component Value Date   HGBA1C 6.6 (H) 05/14/2021   HGBA1C 6.4 (H) 01/12/2021   HGBA1C 6.5 07/20/2020   Lab Results  Component Value Date   MICROALBUR 20 04/17/2014   [place] 32 05/14/2021   CREATININE 1.20 (H) 05/14/2021  Assessed patient's understanding of A1c goal: <7% Reviewed and discussed medications No problems with cost because she has Medicare Extra Help  Reassessed ability to inject ozempic correctly based on previously provided education Chart reviewed including relevant office notes and lab results Discussed home blood sugar testing Testing twice a day and as needed Improved readings with medication changes Advised to call PCP with any readings over 300 or with several readings in a row over 200 Advised to bring log to PCP appointment for review Reinforced carb modified diet and verbal education provided on what that is Encouraged to continue getting outside daily for a walk when the weather permits Provided with RNCM contact number and encouraged to reach out as needed   Hyperlipidemia:  (Status: Goal on Track (progressing): YES.) Long Term Goal  Lab Results  Component Value Date   CHOL 111 05/14/2021   HDL 64 05/14/2021   LDLCALC 32 05/14/2021   TRIG 69 05/14/2021   CHOLHDL 1.7 05/14/2021  Mediations reviewed and importance of compliance discussed Using Repatha injection for HLD control due to statin intolerance  Provider established cholesterol goals reviewed; Counseled on importance of regular laboratory monitoring as prescribed; Reviewed exercise goals and target of 150 minutes per week; Assessed social determinant of health barriers;  Reviewed and discussed most recent lipid panel results Reviewed upcoming appointments   Hypertension: (Status: Condition stable. Not addressed this visit.) Last practice recorded BP readings:  BP Readings from Last 5 Encounters:  05/14/21 136/78  01/12/21 (!) 143/82   12/10/20 (!) 142/63  Evaluation of current treatment plan related to hypertension self management and patient's adherence to plan as established by provider;   Reviewed medications with patient and discussed importance of compliance;  Counseled on the importance of exercise goals with target of 150 minutes per week Discussed plans with patient for ongoing care management follow up and provided patient with direct contact information for care management team; Discussed complications of poorly controlled blood pressure such as heart disease, stroke, circulatory complications, vision complications, kidney impairment, sexual dysfunction;  Assessed social determinant of health barriers;    Patient Goals/Self-Care Activities: Take medications as prescribed   Attend all scheduled provider appointments Perform all self care activities independently  Call provider office for new concerns or questions  check  blood sugar at prescribed times: twice daily and when you have symptoms of low or high blood sugar check feet daily for cuts, sores or redness read food labels for fat, fiber, carbohydrates and portion size Review instructions for how to administer ozempic. Make sure to take the small cap off before you try to inject it. Count to 6 slowly after the window turns back to "0" Call RN Care Manager as needed [phone]  Plan:Telephone follow up appointment with care management team member scheduled for:  08/05/21 with RNCM The patient has been provided with contact information for the care management team and has been advised to call with any health related questions or concerns.   Chong Sicilian, BSN, RN-BC Embedded Chronic Care Manager [facility] [place] [facility] Direct Dial: [phone]

## 2021-08-05 ENCOUNTER — Ambulatory Visit (INDEPENDENT_AMBULATORY_CARE_PROVIDER_SITE_OTHER): Payer: Medicare Other | Admitting: *Deleted

## 2021-08-05 DIAGNOSIS — I152 Hypertension secondary to endocrine disorders: Secondary | ICD-10-CM

## 2021-08-05 DIAGNOSIS — E1142 Type 2 diabetes mellitus with diabetic polyneuropathy: Secondary | ICD-10-CM

## 2021-08-05 NOTE — Chronic Care Management (AMB) (Signed)
Chronic Care Management   CCM RN Visit Note  08/05/2021 Name: Denise Gardner MRN: [id number] DOB: Apr 17, 1945  Subjective: Denise Gardner is a 76 y.o. year old female who is a primary care patient of Sharion Balloon, FNP. The care management team was consulted for assistance with disease management and care coordination needs.    Engaged with patient by telephone for follow up visit in response to provider referral for case management and/or care coordination services.   Consent to Services:  The patient was given information about Chronic Care Management services, agreed to services, and gave verbal consent prior to initiation of services.  Please see initial visit note for detailed documentation.   Patient agreed to services and verbal consent obtained.   Assessment: Review of patient past medical history, allergies, medications, health status, including review of consultants reports, laboratory and other test data, was performed as part of comprehensive evaluation and provision of chronic care management services.   SDOH (Social Determinants of Health) assessments and interventions performed:    CCM Care Plan  Allergies  Allergen Reactions   Milk-Related Compounds Itching and Nausea And Vomiting   Eggs Or Egg-Derived Products Itching, Nausea And Vomiting and Other (See Comments)    headaches   Lac Bovis Other (See Comments)    Per PCP office   Other Other (See Comments)    Other reaction(s): Other (See Comments) Per [facility] Other reaction(s): Other (See Comments) Per [facility] Per [facility]   Statins    Crestor [Rosuvastatin]     Myalgias    Ezetimibe Other (See Comments)    mylagia Per PCP office    Outpatient Encounter Medications as of 08/05/2021  Medication Sig   Accu-Chek FastClix Lancets MISC CHECK BLOOD SUGAR UP TO FOUR TIMES DAILYDx E11.9   acetaminophen (TYLENOL) 500 MG tablet Take 500 mg by mouth 2 (two) times daily. Pt takes 2 tablets at  bedtime   amLODipine (NORVASC) 10 MG tablet Take 1 tablet (10 mg total) by mouth daily.   Ascorbic Acid (VITAMIN C) 100 MG tablet Take 100 mg by mouth daily.   aspirin 81 MG chewable tablet Chew 81 mg by mouth daily.   Biotin 5000 MCG TABS Take by mouth.   blood glucose meter kit and supplies Dispense based on patient and insurance preference. Use up to four times daily as directed. E11. Whatever insurance will cover   cholecalciferol (VITAMIN D) 400 UNITS TABS Take by mouth. VITAMIN D3 daily    cyanocobalamin 100 MCG tablet Take 100 mcg by mouth daily.   diazepam (VALIUM) 5 MG tablet Take 1 tablet (5 mg total) by mouth 2 (two) times daily. Pt takes one tablet at daytime and one tablet at bedtime   empagliflozin (JARDIANCE) 25 MG TABS tablet TAKE 1 TABLET(25 MG) BY MOUTH DAILY BEFORE AND BREAKFAST   escitalopram (LEXAPRO) 10 MG tablet TAKE 1 TABLET(10 MG) BY MOUTH DAILY   Evolocumab (REPATHA SURECLICK) 350 MG/ML SOAJ ADMINISTER 1 ML UNDER THE SKIN EVERY 14 DAYS   FLOVENT HFA 110 MCG/ACT inhaler INHALE 2 PUFFS INTO THE LUNGS TWICE DAILY   fluticasone (FLONASE) 50 MCG/ACT nasal spray SHAKE LIQUID AND USE 2 SPRAYS IN EACH NOSTRIL DAILY   gabapentin (NEURONTIN) 300 MG capsule Take 300 mg in AM and 900 mg at bedtime   Garlic 10 MG CAPS Take by mouth.   glucose blood (ACCU-CHEK GUIDE) test strip CHECK BLOOD SUGAR UP TO FOUR TIMES DAILY Dx E11.69   hydrOXYzine (  VISTARIL) 25 MG capsule TAKE 1 CAPSULE(25 MG) BY MOUTH THREE TIMES DAILY AS NEEDED   icosapent Ethyl (VASCEPA) 1 g capsule TAKE 4 CAPSULES BY MOUTH EVERY DAY   ipratropium-albuterol (DUONEB) 0.5-2.5 (3) MG/3ML SOLN USE 1 VIAL VIA NEBULIZER EVERY 6 HOURS   isosorbide mononitrate (IMDUR) 120 MG 24 hr tablet TAKE 1 TABLET(120 MG) BY MOUTH DAILY   JANUVIA 100 MG tablet Take 100 mg by mouth daily.   levothyroxine (SYNTHROID) 88 MCG tablet Take 1 tablet (88 mcg total) by mouth daily.   linaclotide (LINZESS) 72 MCG capsule TAKE 1 CAPSULE(72 MCG) BY  MOUTH DAILY BEFORE AND BREAKFAST   meloxicam (MOBIC) 7.5 MG tablet TAKE 1 TABLET(7.5 MG) BY MOUTH DAILY   metFORMIN (GLUCOPHAGE-XR) 500 MG 24 hr tablet TAKE 2 TABLETS(1000 MG) BY MOUTH DAILY WITH BREAKFAST   metoprolol tartrate (LOPRESSOR) 25 MG tablet TAKE 1 TABLET(25 MG) BY MOUTH TWICE DAILY   misoprostol (CYTOTEC) 200 MCG tablet TAKE 1 TABLET(200 MCG) BY MOUTH FOUR TIMES DAILY   montelukast (SINGULAIR) 10 MG tablet TAKE 1 TABLET(10 MG) BY MOUTH AT BEDTIME   Multiple Vitamins-Minerals (HAIR SKIN AND NAILS FORMULA PO) Take 1 tablet by mouth daily.   nitroGLYCERIN (NITROSTAT) 0.4 MG SL tablet DISSOLVE 1 TABLET UNDER THE TONGUE EVERY 5 MINUTES AS  NEEDED FOR CHEST PAIN. MAX  OF 3 TAB 15 MINUTES. CALL 911 IF PAIN PERSISTS.   omeprazole (PRILOSEC) 20 MG capsule TAKE 1 CAPSULE BY MOUTH TWICE DAILY BEFORE A MEAL   ondansetron (ZOFRAN) 4 MG tablet Take 1 tablet (4 mg total) by mouth every 8 (eight) hours as needed for nausea or vomiting.   raloxifene (EVISTA) 60 MG tablet TAKE 1 TABLET(60 MG) BY MOUTH DAILY   solifenacin (VESICARE) 10 MG tablet TAKE 1 TABLET(10 MG) BY MOUTH DAILY   zinc gluconate 50 MG tablet Take 50 mg by mouth daily.   No facility-administered encounter medications on file as of 08/05/2021.    Patient Active Problem List   Diagnosis Date Noted   Precordial chest pain 03/03/2020   Controlled substance agreement signed 08/16/2018   Myalgia 11/24/2017   Iron deficiency anemia 05/26/2017   Vitamin B 12 deficiency 05/26/2017   Obesity (BMI 30-39.9) 05/25/2017   Constipation 05/19/2016   Hypokalemia 04/30/2015   Depression 07/18/2014   Diabetic neuropathy ([place]) 04/17/2014   OAB (overactive bladder) 04/17/2014   Peripheral neuropathy 04/17/2014   Hypothyroidism 12/27/2012   Osteopenia 05/30/2012   Type 2 diabetes mellitus ([place]) 05/21/2012   Asthma 05/21/2012   Chronic obstructive pulmonary disease ([place]) 05/21/2012   Gastroesophageal reflux disease 05/21/2012   Hyperlipidemia  associated with type 2 diabetes mellitus ([place]) 01/26/2011   Hypertension associated with diabetes ([place])    Anxiety    Chronic coronary artery disease     Conditions to be addressed/monitored:HTN and DMII  Care Plan : [facility] Care Plan  Updates made by Ilean China, RN since 08/05/2021 12:00 AM     Problem: Chroic Disease Management Needs   Priority: High  Onset Date: 02/16/2021     Goal: Patient will Work with RN Care Manager Regarding [place] with DM, HTN, HLD, COPD, and Depression   Start Date: 02/16/2021  Expected End Date: 02/16/2022  This Visit's Progress: On track  Recent Progress: On track  Priority: High  Note:   Current Barriers:  Chronic Disease Management support and education needs related to HTN, HLD, COPD, DMII, and depression  RNCM Clinical Goal(s):  Patient will continue  to work with Consulting civil engineer and/or Social Worker to address care management and care coordination needs related to HTN, HLD, COPD, DMII, and Depression as evidenced by adherence to CM Team Scheduled appointments     through collaboration with Consulting civil engineer, provider, and care team.   Interventions: 1:1 collaboration with primary care provider regarding development and update of comprehensive plan of care as evidenced by provider attestation and co-signature Inter-disciplinary care team collaboration (see longitudinal plan of care) Evaluation of current treatment plan related to  self management and patient's adherence to plan as established by provider Assessed family/social support Assessed mobility and ability to perform ADLs   COPD: (Status: Goal on Track (progressing): YES.) Long Term Goal  Reviewed and discussed medications Advised patient to track and manage COPD triggers Advised patient to engage in light exercise as tolerated 3-5 days a week to aid in the the management of COPD Discussed the importance of adequate rest and management of  fatigue with COPD Assessed social determinant of health barriers   Diabetes:  (Status: Goal on Track (progressing): YES.) Long Term Goal  Lab Results  Component Value Date   HGBA1C 6.6 (H) 05/14/2021   HGBA1C 6.4 (H) 01/12/2021   HGBA1C 6.5 07/20/2020   Lab Results  Component Value Date   MICROALBUR 20 04/17/2014   [place] 32 05/14/2021   CREATININE 1.20 (H) 05/14/2021  Assessed patient's understanding of A1c goal: <7% Reviewed and discussed medications No problems with cost because she has Medicare Extra Help  Discussed that she has stopped taking ozempic due to side effects. She felt bad in general and was nauseated all of the time. She feels better since d/c ozempic but blood sugar is not as well controlled. Chart reviewed including relevant office notes and lab results Discussed home blood sugar testing Testing twice a day and as needed Improved readings with medication changes Advised to call PCP with any readings over 300 or with several readings in a row over 200 Advised to bring log to PCP appointment for review Reinforced carb modified diet and verbal education provided on what that is Encouraged to continue getting outside daily for a walk when the weather permits Provided with RNCM contact number and encouraged to reach out as needed Reviewed upcoming appointments Advised to talk with PCP and possibly PharmD regarding ozempic side effects and other possible diabetes treatments   Hyperlipidemia:  (Status: Goal on Track (progressing): YES.) Long Term Goal  Lab Results  Component Value Date   CHOL 111 05/14/2021   HDL 64 05/14/2021   LDLCALC 32 05/14/2021   TRIG 69 05/14/2021   CHOLHDL 1.7 05/14/2021  Mediations reviewed and importance of compliance discussed Using Repatha injection for HLD control due to statin intolerance  Provider established cholesterol goals reviewed; Counseled on importance of regular laboratory monitoring as prescribed; Reviewed exercise  goals and target of 150 minutes per week; Assessed social determinant of health barriers;  Reviewed and discussed most recent lipid panel results Reviewed upcoming appointments   Hypertension: (Status: Goal on Track (progressing): YES.) Last practice recorded BP readings:  BP Readings from Last 3 Encounters:  06/23/21 140/90  06/15/21 110/72  05/14/21 136/78  Evaluation of current treatment plan related to hypertension self management and patient's adherence to plan as established by provider;   Reviewed medications with patient and discussed importance of compliance;  Counseled on the importance of exercise goals with target of 150 minutes per week Discussed plans with patient for ongoing care management follow up  and provided patient with direct contact information for care management team; Discussed complications of poorly controlled blood pressure such as heart disease, stroke, circulatory complications, vision complications, kidney impairment, sexual dysfunction;  Assessed social determinant of health barriers;    Patient Goals/Self-Care Activities: Take medications as prescribed   Attend all scheduled provider appointments Perform all self care activities independently  Call provider office for new concerns or questions  check blood sugar at prescribed times: twice daily and when you have symptoms of low or high blood sugar check feet daily for cuts, sores or redness read food labels for fat, fiber, carbohydrates and portion size Call RN Care Manager as needed [phone]  Plan:Telephone follow up appointment with care management team member scheduled for:  08/30/21 at 12:45 with [facility] The patient has been provided with contact information for the care management team and has been advised to call with any health related questions or concerns.   Chong Sicilian, BSN, RN-BC Embedded Chronic Care Manager [facility] / [place] Management Direct Dial:  [phone]

## 2021-08-05 NOTE — Patient Instructions (Signed)
Visit Information  Thank you for taking time to visit with me today. Please don't hesitate to contact me if I can be of assistance to you before our next scheduled telephone appointment.  Following are the goals we discussed today:  Take medications as prescribed   Attend all scheduled provider appointments Perform all self care activities independently  Call provider office for new concerns or questions  check blood sugar at prescribed times: twice daily and when you have symptoms of low or high blood sugar check feet daily for cuts, sores or redness read food labels for fat, fiber, carbohydrates and portion size Call RN Care Manager as needed [phone]  Our next appointment is by telephone on 08/30/21 at 12:45  Please call the care guide team at [phone] if you need to cancel or reschedule your appointment.   If you are experiencing a Mental Health or [place] or need someone to talk to, please call the [facility]: [phone] call 911   The patient verbalized understanding of instructions, educational materials, and care plan provided today and agreed to receive a mailed copy of patient instructions, educational materials, and care plan.   Chong Sicilian, BSN, RN-BC Embedded Chronic Care Manager [facility] / [place] [facility] Direct Dial: [phone]

## 2021-08-09 ENCOUNTER — Telehealth: Payer: Medicare Other

## 2021-08-10 ENCOUNTER — Ambulatory Visit (INDEPENDENT_AMBULATORY_CARE_PROVIDER_SITE_OTHER): Payer: Medicare Other | Admitting: Family

## 2021-08-10 ENCOUNTER — Encounter: Payer: Self-pay | Admitting: Family

## 2021-08-10 VITALS — BP 129/59 | HR 67 | Temp 97.8°F | Ht 65.0 in | Wt 191.0 lb

## 2021-08-10 DIAGNOSIS — E1142 Type 2 diabetes mellitus with diabetic polyneuropathy: Secondary | ICD-10-CM

## 2021-08-10 DIAGNOSIS — I152 Hypertension secondary to endocrine disorders: Secondary | ICD-10-CM

## 2021-08-10 DIAGNOSIS — K5902 Outlet dysfunction constipation: Secondary | ICD-10-CM

## 2021-08-10 DIAGNOSIS — K219 Gastro-esophageal reflux disease without esophagitis: Secondary | ICD-10-CM

## 2021-08-10 DIAGNOSIS — J4520 Mild intermittent asthma, uncomplicated: Secondary | ICD-10-CM

## 2021-08-10 DIAGNOSIS — J449 Chronic obstructive pulmonary disease, unspecified: Secondary | ICD-10-CM

## 2021-08-10 DIAGNOSIS — E1169 Type 2 diabetes mellitus with other specified complication: Secondary | ICD-10-CM | POA: Diagnosis not present

## 2021-08-10 DIAGNOSIS — F3342 Major depressive disorder, recurrent, in full remission: Secondary | ICD-10-CM

## 2021-08-10 DIAGNOSIS — F419 Anxiety disorder, unspecified: Secondary | ICD-10-CM

## 2021-08-10 DIAGNOSIS — J45909 Unspecified asthma, uncomplicated: Secondary | ICD-10-CM | POA: Diagnosis not present

## 2021-08-10 DIAGNOSIS — E669 Obesity, unspecified: Secondary | ICD-10-CM

## 2021-08-10 DIAGNOSIS — N3281 Overactive bladder: Secondary | ICD-10-CM | POA: Diagnosis not present

## 2021-08-10 DIAGNOSIS — E785 Hyperlipidemia, unspecified: Secondary | ICD-10-CM | POA: Diagnosis not present

## 2021-08-10 DIAGNOSIS — E039 Hypothyroidism, unspecified: Secondary | ICD-10-CM

## 2021-08-10 DIAGNOSIS — E538 Deficiency of other specified B group vitamins: Secondary | ICD-10-CM

## 2021-08-10 DIAGNOSIS — I2510 Atherosclerotic heart disease of native coronary artery without angina pectoris: Secondary | ICD-10-CM

## 2021-08-10 DIAGNOSIS — Z79899 Other long term (current) drug therapy: Secondary | ICD-10-CM | POA: Diagnosis not present

## 2021-08-10 DIAGNOSIS — D509 Iron deficiency anemia, unspecified: Secondary | ICD-10-CM

## 2021-08-10 DIAGNOSIS — E1159 Type 2 diabetes mellitus with other circulatory complications: Secondary | ICD-10-CM | POA: Diagnosis not present

## 2021-08-10 LAB — BAYER DCA HB A1C WAIVED: HB A1C (BAYER DCA - WAIVED): 6.3 % — ABNORMAL HIGH (ref 4.8–5.6)

## 2021-08-10 MED ORDER — GABAPENTIN 300 MG PO CAPS: ORAL_CAPSULE | ORAL | 2 refills | Status: DC

## 2021-08-10 MED ORDER — IPRATROPIUM-ALBUTEROL 0.5-2.5 (3) MG/3ML IN SOLN: RESPIRATORY_TRACT | 2 refills | Status: DC

## 2021-08-10 MED ORDER — ALBUTEROL SULFATE (2.5 MG/3ML) 0.083% IN NEBU: 2.5000 mg | INHALATION_SOLUTION | Freq: Four times a day (QID) | RESPIRATORY_TRACT | 1 refills | Status: DC | PRN

## 2021-08-10 NOTE — Progress Notes (Signed)
Subjective:    Patient ID: Denise Gardner, female    DOB: 1945-08-15, 76 y.o.   MRN: [id number]  Chief Complaint  Patient presents with   Medical Management of Chronic Issues   Pt presents to the office today for chronic follow up. She is followed by  Cardiologists annually for CAD. She is followed by Hematologists  for iron deficiency anemia and Vit B 12 deficiency.  She stopped her Ozempic because of nausea. Her glucose has been >200. She is complaining of burning aching pain of bilateral feet that is a 10 out 10 during night.   Has COPD and has intermittent SOB.  Hypertension This is a chronic problem. The current episode started more than 1 year ago. The problem has been waxing and waning since onset. The problem is controlled. Associated symptoms include blurred vision, malaise/fatigue, peripheral edema and shortness of breath. Risk factors for coronary artery disease include dyslipidemia, diabetes mellitus, obesity and sedentary lifestyle. The current treatment provides moderate improvement. Identifiable causes of hypertension include a thyroid problem.  Asthma She complains of cough, hoarse voice, shortness of breath and wheezing. This is a chronic problem. The current episode started more than 1 year ago. The problem occurs intermittently. Associated symptoms include heartburn and malaise/fatigue. Her symptoms are aggravated by pollen. She reports moderate improvement on treatment. Her past medical history is significant for asthma.  Gastroesophageal Reflux She complains of belching, coughing, heartburn, a hoarse voice and wheezing. This is a chronic problem. The current episode started more than 1 year ago. The problem occurs occasionally. The problem has been waxing and waning. Associated symptoms include fatigue. Risk factors include obesity. She has tried a PPI for the symptoms. The treatment provided moderate relief.  Diabetes She presents for her follow-up diabetic visit. She has  type 2 diabetes mellitus. Hypoglycemia symptoms include nervousness/anxiousness. Associated symptoms include blurred vision, fatigue and foot paresthesias. Diabetic complications include heart disease and peripheral neuropathy. Risk factors for coronary artery disease include dyslipidemia, diabetes mellitus, hypertension, stress and post-menopausal. She is following a generally unhealthy diet. Her overall blood glucose range is >200 mg/dl. Eye exam is not current.  Thyroid Problem Presents for follow-up visit. Symptoms include anxiety, depressed mood, fatigue and hoarse voice. The symptoms have been stable. Her past medical history is significant for hyperlipidemia.  Hyperlipidemia This is a chronic problem. The current episode started more than 1 year ago. Exacerbating diseases include obesity. Associated symptoms include shortness of breath. The current treatment provides moderate improvement of lipids. Risk factors for coronary artery disease include dyslipidemia, diabetes mellitus, hypertension and a sedentary lifestyle.  Urinary Frequency  This is a chronic problem. The current episode started more than 1 year ago. The problem occurs intermittently. The problem has been waxing and waning. Associated symptoms include frequency.  Depression        This is a chronic problem.  The current episode started more than 1 year ago.   The onset quality is gradual.   The problem occurs intermittently.  Associated symptoms include fatigue.  Associated symptoms include no helplessness, no hopelessness, no restlessness and not sad.  Past medical history includes thyroid problem.   Anxiety Presents for follow-up visit. Symptoms include depressed mood, nervous/anxious behavior and shortness of breath. Patient reports no excessive worry, irritability or restlessness. Symptoms occur most days.   Her past medical history is significant for anemia and asthma.  Anemia Presents for follow-up visit. Symptoms include  malaise/fatigue. There has been no bruising/bleeding easily.  Review of Systems  Constitutional:  Positive for fatigue and malaise/fatigue. Negative for irritability.  HENT:  Positive for hoarse voice.   Eyes:  Positive for blurred vision.  Respiratory:  Positive for cough, shortness of breath and wheezing.   Gastrointestinal:  Positive for heartburn.  Genitourinary:  Positive for frequency.  Hematological:  Does not bruise/bleed easily.  Psychiatric/Behavioral:  Positive for depression. The patient is nervous/anxious.   All other systems reviewed and are negative.      Objective:   Physical Exam Vitals reviewed.  Constitutional:      General: She is not in acute distress.    Appearance: She is well-developed. She is obese.  HENT:     Head: Normocephalic and atraumatic.     Right Ear: Tympanic membrane normal.     Left Ear: Tympanic membrane normal.  Eyes:     Pupils: Pupils are equal, round, and reactive to light.  Neck:     Thyroid: No thyromegaly.  Cardiovascular:     Rate and Rhythm: Normal rate and regular rhythm.     Heart sounds: Normal heart sounds. No murmur heard. Pulmonary:     Effort: Pulmonary effort is normal. No respiratory distress.     Breath sounds: Normal breath sounds. No wheezing.  Abdominal:     General: Bowel sounds are normal. There is no distension.     Palpations: Abdomen is soft.     Tenderness: There is no abdominal tenderness.  Musculoskeletal:        General: No tenderness. Normal range of motion.     Cervical back: Normal range of motion and neck supple.     Right lower leg: Edema (trace) present.     Left lower leg: Edema (trace) present.  Skin:    General: Skin is warm and dry.  Neurological:     Mental Status: She is alert and oriented to person, place, and time.     Cranial Nerves: No cranial nerve deficit.     Motor: Weakness (using cane) present.     Gait: Gait abnormal.     Deep Tendon Reflexes: Reflexes are normal and  symmetric.  Psychiatric:        Behavior: Behavior normal.        Thought Content: Thought content normal.        Judgment: Judgment normal.      BP (!) 129/59   Pulse 67   Temp 97.8 F (36.6 C) (Temporal)   Ht '5\' 5"'  (1.651 m)   Wt 191 lb (86.6 kg)   SpO2 (!) 76%   BMI 31.78 kg/m      Assessment & Plan:   Denise Gardner comes in today with chief complaint of Medical Management of Chronic Issues   Diagnosis and orders addressed:  1. Hypertension associated with diabetes ([place]) - CMP14+EGFR - CBC with Differential/Platelet  2. Type 2 diabetes mellitus with diabetic polyneuropathy, without long-term current use of insulin (HCC) - Bayer DCA Hb A1c Waived - gabapentin (NEURONTIN) 300 MG capsule; Take 300 mg in AM and 900 mg at bedtime  Dispense: 360 capsule; Refill: 2  3. Hyperlipidemia associated with type 2 diabetes mellitus ([place]) - Lipid panel  4. Hypothyroidism, unspecified type - TSH  5. Chronic coronary artery disease  6. Chronic obstructive pulmonary disease, unspecified COPD type (HCC) - albuterol (PROVENTIL) (2.5 MG/3ML) 0.083% nebulizer solution; Take 3 mLs (2.5 mg total) by nebulization every 6 (six) hours as needed for wheezing or shortness of breath.  Dispense: 150 mL;  Refill: 1  7. Uncomplicated asthma, unspecified asthma severity, unspecified whether persistent  8. Gastroesophageal reflux disease, unspecified whether esophagitis present  9. OAB (overactive bladder)  10. Anxiety  11. Constipation due to outlet dysfunction  12. Controlled substance agreement signed  13. Recurrent major depressive disorder, in full remission ([place])  14. Iron deficiency anemia, unspecified iron deficiency anemia type   15. Obesity (BMI 30-39.9)  16. Vitamin B 12 deficiency  17. Diabetic polyneuropathy associated with type 2 diabetes mellitus ([place])  18. Mild intermittent asthma without complication  - ipratropium-albuterol (DUONEB) 0.5-2.5 (3) MG/3ML SOLN; USE 1  VIAL VIA NEBULIZER EVERY 6 HOURS  Dispense: 1080 mL; Refill: 2   Labs pending Health Maintenance reviewed Diet and exercise encouraged  Follow up plan: 3 months  Evelina Dun, FNP

## 2021-08-10 NOTE — Patient Instructions (Signed)
Diabetic Neuropathy Diabetic neuropathy refers to nerve damage that is caused by diabetes. Over time, people with diabetes can develop nerve damage throughout the body. There are several types of diabetic neuropathy: Peripheral neuropathy. This is the most common type of diabetic neuropathy. It damages the nerves that carry signals between the spinal cord and other parts of the body (peripheral nerves). This usually affects nerves in the feet, legs, hands, and arms. Autonomic neuropathy. This type causes damage to nerves that control involuntary functions (autonomic nerves). Involuntary functions are functions of the body that you do not control. They include heartbeat, body temperature, blood pressure, urination, digestion, sweating, sexual function, or response to changes in blood glucose. Focal neuropathy. This type of nerve damage affects one area of the body, such as an arm, a leg, or the face. The injury may involve one nerve or a small group of nerves. Focal neuropathy can be painful and unpredictable. It occurs most often in older adults with diabetes. This often develops suddenly, but usually improves over time and does not cause long-term problems. Proximal neuropathy. This type of nerve damage affects the nerves of the thighs, hips, buttocks, or legs. It causes severe pain, weakness, and muscle death (atrophy), usually in the thigh muscles. It is more common among older men and people who have type 2 diabetes. The length of recovery time may vary. What are the causes? Peripheral, autonomic, and focal neuropathies are caused by diabetes that is not well controlled with treatment. The cause of proximal neuropathy is not known, but it may be caused by inflammation related to uncontrolled blood glucose levels. What are the signs or symptoms? Peripheral neuropathy Peripheral neuropathy develops slowly over time. When the nerves of the feet and legs no longer work, you may experience: Burning,  stabbing, or aching pain in the legs or feet. Pain or cramping in the legs or feet. Loss of feeling (numbness) and inability to feel pressure or pain in the feet. This can lead to: Thick calluses or sores on areas of constant pressure. Ulcers. Reduced ability to feel temperature changes. Foot deformities. Muscle weakness. Loss of balance or coordination. Autonomic neuropathy The symptoms of autonomic neuropathy vary depending on which nerves are affected. Symptoms may include: Problems with digestion, such as: Nausea or vomiting. Poor appetite. Bloating. Diarrhea or constipation. Trouble swallowing. Losing weight without trying to. Problems with the heart, blood, and lungs, such as: Dizziness, especially when standing up. Fainting. Shortness of breath. Irregular heartbeat. Bladder problems, such as: Trouble starting or stopping urination. Leaking urine. Trouble emptying the bladder. Urinary tract infections (UTIs). Problems with other body functions, such as: Sweat. You may sweat too much or too little. Temperature. You might get hot easily. Or, you might feel cold more than usual. Sexual function. Men may not be able to get or maintain an erection. Women may have vaginal dryness and difficulty with arousal. Focal neuropathy Symptoms affect only one area of the body. Common symptoms include: Numbness. Tingling. Burning pain. Prickling feeling. Very sensitive skin. Weakness. Inability to move (paralysis). Muscle twitching. Muscles getting smaller (wasting). Poor coordination. Double or blurred vision. Proximal neuropathy Sudden, severe pain in the hip, thigh, or buttocks. Pain may spread from the back into the legs (sciatica). Pain and numbness in the arms and legs. Tingling. Loss of bladder control or bowel control. Weakness and wasting of thigh muscles. Difficulty getting up from a seated position. Abdominal swelling. Unexplained weight loss. How is this  diagnosed? Diagnosis varies depending on the type   of neuropathy your health care provider suspects. Peripheral neuropathy Your health care provider will do a neurologic exam. This exam checks your reflexes, how you move, and what you can feel. You may have other tests, such as: Blood tests. Tests of the fluid that surrounds the spinal cord (lumbar puncture). CT scan. MRI. Checking the nerves that control muscles (electromyogram, or EMG). Checking how quickly signals pass through your nerves (nerve conduction study). Checking a small piece of a nerve using a microscope (biopsy). Autonomic neuropathy You may have tests, such as: Tests to measure your blood pressure and heart rate. You may be secured to an exam table that moves you from a lying position to an upright position (table tilt test). Breathing tests to check your lungs. Tests to check how food moves through the digestive system (gastric emptying tests). Blood, sweat, or urine tests. Ultrasound of your bladder. Spinal fluid tests. Focal neuropathy This condition may be diagnosed with: A neurologic exam. CT scan. MRI. EMG. Nerve conduction study. Proximal neuropathy There is no test to diagnose this type of neuropathy. You may have tests to rule out other possible causes of this type of neuropathy. Tests may include: X-rays of your spine and lumbar region. Lumbar puncture. MRI. How is this treated? The goal of treatment is to keep nerve damage from getting worse. Treatment may include: Following your diabetes management plan. This will help keep your blood glucose level and your A1C level within your target range. This is the most important treatment. Using prescription pain medicine. Follow these instructions at home: Diabetes management Follow your diabetes management plan as told by your health care provider. Check your blood glucose levels. Keep your blood glucose in your target range. Have your A1C level checked at  least two times a year, or as often as told. Take over the counter and prescription medicines only as told by your health care provider. This includes insulin and diabetes medicine.  Lifestyle  Do not use any products that contain nicotine or tobacco, such as cigarettes, e-cigarettes, and chewing tobacco. If you need help quitting, ask your health care provider. Be physically active every day. Include strength training and balance exercises. Follow a healthy meal plan. Work with your health care provider to manage your blood pressure. General instructions Ask your health care provider if the medicine prescribed to you requires you to avoid driving or using machinery. Check your skin and feet every day for cuts, bruises, redness, blisters, or sores. Keep all follow-up visits. This is important. Contact a health care provider if: You have burning, stabbing, or aching pain in your legs or feet. You are unable to feel pressure or pain in your feet. You develop problems with digestion, such as: Nausea. Vomiting. Bloating. Constipation. Diarrhea. Abdominal pain. You have difficulty with urination, such as: Inability to control when you urinate (incontinence). Inability to completely empty the bladder (retention). You feel as if your heart is racing (palpitations). You feel dizzy, weak, or faint when you stand up. Get help right away if: You cannot urinate. You have sudden weakness or loss of coordination. You have trouble speaking. You have pain or pressure in your chest. You have an irregular heartbeat. You have sudden inability to move a part of your body. These symptoms may represent a serious problem that is an emergency. Do not wait to see if the symptoms will go away. Get medical help right away. Call your local emergency services (911 in the [place] Do not drive yourself to   the hospital. Summary Diabetic neuropathy is nerve damage that is caused by diabetes. It can cause numbness  and pain in the arms, legs, digestive tract, heart, and other body systems. This condition is treated by keeping your blood glucose level and your A1C level within your target range. This can help prevent neuropathy from getting worse. Check your skin and feet every day for cuts, bruises, redness, blisters, or sores. Do not use any products that contain nicotine or tobacco, such as cigarettes, e-cigarettes, and chewing tobacco. This information is not intended to replace advice given to you by your health care provider. Make sure you discuss any questions you have with your health care provider. Document Revised: 06/27/2019 Document Reviewed: 06/27/2019 Elsevier Patient Education  [place].

## 2021-08-11 LAB — CBC WITH DIFFERENTIAL/PLATELET
Basophils Absolute: 0.1 10*3/uL (ref 0.0–0.2)
Basos: 1 %
EOS (ABSOLUTE): 0.3 10*3/uL (ref 0.0–0.4)
Eos: 4 %
Hematocrit: 43.1 % (ref 34.0–46.6)
Hemoglobin: 14.6 g/dL (ref 11.1–15.9)
Immature Grans (Abs): 0 10*3/uL (ref 0.0–0.1)
Immature Granulocytes: 0 %
Lymphocytes Absolute: 3 10*3/uL (ref 0.7–3.1)
Lymphs: 37 %
MCH: 29.3 pg (ref 26.6–33.0)
MCHC: 33.9 g/dL (ref 31.5–35.7)
MCV: 86 fL (ref 79–97)
Monocytes Absolute: 0.6 10*3/uL (ref 0.1–0.9)
Monocytes: 8 %
Neutrophils Absolute: 4 10*3/uL (ref 1.4–7.0)
Neutrophils: 50 %
Platelets: 222 10*3/uL (ref 150–450)
RBC: 4.99 x10E6/uL (ref 3.77–5.28)
RDW: 13.1 % (ref 11.7–15.4)
WBC: 8 10*3/uL (ref 3.4–10.8)

## 2021-08-11 LAB — CMP14+EGFR
ALT: 10 IU/L (ref 0–32)
AST: 12 IU/L (ref 0–40)
Albumin/Globulin Ratio: 1.6 (ref 1.2–2.2)
Albumin: 4.2 g/dL (ref 3.7–4.7)
Alkaline Phosphatase: 42 IU/L — ABNORMAL LOW (ref 44–121)
BUN/Creatinine Ratio: 19 (ref 12–28)
BUN: 23 mg/dL (ref 8–27)
Bilirubin Total: 0.4 mg/dL (ref 0.0–1.2)
CO2: 26 mmol/L (ref 20–29)
Calcium: 10.1 mg/dL (ref 8.7–10.3)
Chloride: 101 mmol/L (ref 96–106)
Creatinine, Ser: 1.2 mg/dL — ABNORMAL HIGH (ref 0.57–1.00)
Globulin, Total: 2.6 g/dL (ref 1.5–4.5)
Glucose: 138 mg/dL — ABNORMAL HIGH (ref 70–99)
Potassium: 4.8 mmol/L (ref 3.5–5.2)
Sodium: 140 mmol/L (ref 134–144)
Total Protein: 6.8 g/dL (ref 6.0–8.5)
eGFR: 46 mL/min/{1.73_m2} — ABNORMAL LOW (ref >59)

## 2021-08-11 LAB — LIPID PANEL
Chol/HDL Ratio: 1.8 ratio (ref 0.0–4.4)
Cholesterol, Total: 122 mg/dL (ref 100–199)
HDL: 67 mg/dL (ref >39)
LDL Chol Calc (NIH): 42 mg/dL (ref 0–99)
Triglycerides: 57 mg/dL (ref 0–149)
VLDL Cholesterol Cal: 13 mg/dL (ref 5–40)

## 2021-08-11 LAB — TSH: TSH: 1.18 u[IU]/mL (ref 0.450–4.500)

## 2021-08-12 ENCOUNTER — Other Ambulatory Visit: Payer: Self-pay | Admitting: Family

## 2021-08-12 DIAGNOSIS — N183 Chronic kidney disease, stage 3 unspecified: Secondary | ICD-10-CM | POA: Insufficient documentation

## 2021-08-12 DIAGNOSIS — N1831 Chronic kidney disease, stage 3a: Secondary | ICD-10-CM

## 2021-08-24 ENCOUNTER — Ambulatory Visit: Payer: Medicare Other | Admitting: Family

## 2021-08-27 DIAGNOSIS — J449 Chronic obstructive pulmonary disease, unspecified: Secondary | ICD-10-CM | POA: Diagnosis not present

## 2021-08-27 DIAGNOSIS — E1159 Type 2 diabetes mellitus with other circulatory complications: Secondary | ICD-10-CM

## 2021-08-27 DIAGNOSIS — I10 Essential (primary) hypertension: Secondary | ICD-10-CM | POA: Diagnosis not present

## 2021-08-27 DIAGNOSIS — E785 Hyperlipidemia, unspecified: Secondary | ICD-10-CM | POA: Diagnosis not present

## 2021-08-30 ENCOUNTER — Other Ambulatory Visit: Payer: Self-pay | Admitting: Family

## 2021-08-30 ENCOUNTER — Ambulatory Visit (INDEPENDENT_AMBULATORY_CARE_PROVIDER_SITE_OTHER): Payer: Medicare Other | Admitting: *Deleted

## 2021-08-30 DIAGNOSIS — E1142 Type 2 diabetes mellitus with diabetic polyneuropathy: Secondary | ICD-10-CM

## 2021-08-30 DIAGNOSIS — E1159 Type 2 diabetes mellitus with other circulatory complications: Secondary | ICD-10-CM

## 2021-08-30 DIAGNOSIS — I152 Hypertension secondary to endocrine disorders: Secondary | ICD-10-CM

## 2021-08-30 DIAGNOSIS — N1831 Chronic kidney disease, stage 3a: Secondary | ICD-10-CM

## 2021-08-30 NOTE — Chronic Care Management (AMB) (Signed)
Chronic Care Management   CCM RN Visit Note  08/30/2021 Name: Denise Gardner MRN: [id number] DOB: [birth year]  Subjective: Prescilla Gardner is a 76 y.o. year old female who is a primary care patient of Sharion Balloon, FNP. The care management team was consulted for assistance with disease management and care coordination needs.    Engaged with patient by telephone for follow up visit in response to provider referral for case management and/or care coordination services.   Consent to Services:  The patient was given information about Chronic Care Management services, agreed to services, and gave verbal consent prior to initiation of services.  Please see initial visit note for detailed documentation.   Patient agreed to services and verbal consent obtained.   Assessment: Review of patient past medical history, allergies, medications, health status, including review of consultants reports, laboratory and other test data, was performed as part of comprehensive evaluation and provision of chronic care management services.   SDOH (Social Determinants of Health) assessments and interventions performed:    CCM Care Plan  Allergies  Allergen Reactions   Milk-Related Compounds Itching and Nausea And Vomiting   Eggs Or Egg-Derived Products Itching, Nausea And Vomiting and Other (See Comments)    headaches   Lac Bovis Other (See Comments)    Per PCP office   Other Other (See Comments)    Other reaction(s): Other (See Comments) Per [facility] Other reaction(s): Other (See Comments) Per [facility] Per [facility]   Statins    Crestor [Rosuvastatin]     Myalgias    Ezetimibe Other (See Comments)    mylagia Per PCP office    Outpatient Encounter Medications as of 08/30/2021  Medication Sig   Accu-Chek FastClix Lancets MISC CHECK BLOOD SUGAR UP TO FOUR TIMES DAILYDx E11.9   acetaminophen (TYLENOL) 500 MG tablet Take 500 mg by mouth 2 (two) times daily. Pt takes 2 tablets at  bedtime   albuterol (PROVENTIL) (2.5 MG/3ML) 0.083% nebulizer solution Take 3 mLs (2.5 mg total) by nebulization every 6 (six) hours as needed for wheezing or shortness of breath.   amLODipine (NORVASC) 10 MG tablet Take 1 tablet (10 mg total) by mouth daily.   Ascorbic Acid (VITAMIN C) 100 MG tablet Take 100 mg by mouth daily.   aspirin 81 MG chewable tablet Chew 81 mg by mouth daily.   Biotin 5000 MCG TABS Take by mouth.   blood glucose meter kit and supplies Dispense based on patient and insurance preference. Use up to four times daily as directed. E11. Whatever insurance will cover   cholecalciferol (VITAMIN D) 400 UNITS TABS Take by mouth. VITAMIN D3 daily    cyanocobalamin 100 MCG tablet Take 100 mcg by mouth daily.   empagliflozin (JARDIANCE) 25 MG TABS tablet TAKE 1 TABLET(25 MG) BY MOUTH DAILY BEFORE AND BREAKFAST   escitalopram (LEXAPRO) 10 MG tablet TAKE 1 TABLET(10 MG) BY MOUTH DAILY   Evolocumab (REPATHA SURECLICK) 923 MG/ML SOAJ ADMINISTER 1 ML UNDER THE SKIN EVERY 14 DAYS   FLOVENT HFA 110 MCG/ACT inhaler INHALE 2 PUFFS INTO THE LUNGS TWICE DAILY   fluticasone (FLONASE) 50 MCG/ACT nasal spray SHAKE LIQUID AND USE 2 SPRAYS IN EACH NOSTRIL DAILY   gabapentin (NEURONTIN) 300 MG capsule Take 300 mg in AM and 900 mg at bedtime   glucose blood (ACCU-CHEK GUIDE) test strip CHECK BLOOD SUGAR UP TO FOUR TIMES DAILY Dx E11.69   icosapent Ethyl (VASCEPA) 1 g capsule TAKE 4 CAPSULES BY MOUTH  EVERY DAY   ipratropium-albuterol (DUONEB) 0.5-2.5 (3) MG/3ML SOLN USE 1 VIAL VIA NEBULIZER EVERY 6 HOURS   isosorbide mononitrate (IMDUR) 120 MG 24 hr tablet TAKE 1 TABLET(120 MG) BY MOUTH DAILY   JANUVIA 100 MG tablet Take 100 mg by mouth daily.   levothyroxine (SYNTHROID) 88 MCG tablet Take 1 tablet (88 mcg total) by mouth daily.   linaclotide (LINZESS) 72 MCG capsule TAKE 1 CAPSULE(72 MCG) BY MOUTH DAILY BEFORE AND BREAKFAST   meloxicam (MOBIC) 7.5 MG tablet TAKE 1 TABLET(7.5 MG) BY MOUTH DAILY    metFORMIN (GLUCOPHAGE-XR) 500 MG 24 hr tablet TAKE 2 TABLETS(1000 MG) BY MOUTH DAILY WITH BREAKFAST   metoprolol tartrate (LOPRESSOR) 25 MG tablet TAKE 1 TABLET(25 MG) BY MOUTH TWICE DAILY   misoprostol (CYTOTEC) 200 MCG tablet TAKE 1 TABLET(200 MCG) BY MOUTH FOUR TIMES DAILY   montelukast (SINGULAIR) 10 MG tablet TAKE 1 TABLET(10 MG) BY MOUTH AT BEDTIME   Multiple Vitamins-Minerals (HAIR SKIN AND NAILS FORMULA PO) Take 1 tablet by mouth daily.   nitroGLYCERIN (NITROSTAT) 0.4 MG SL tablet DISSOLVE 1 TABLET UNDER THE TONGUE EVERY 5 MINUTES AS  NEEDED FOR CHEST PAIN. MAX  OF 3 TAB 15 MINUTES. CALL 911 IF PAIN PERSISTS.   omeprazole (PRILOSEC) 20 MG capsule TAKE 1 CAPSULE BY MOUTH TWICE DAILY BEFORE A MEAL   ondansetron (ZOFRAN) 4 MG tablet Take 1 tablet (4 mg total) by mouth every 8 (eight) hours as needed for nausea or vomiting.   raloxifene (EVISTA) 60 MG tablet TAKE 1 TABLET(60 MG) BY MOUTH DAILY   solifenacin (VESICARE) 10 MG tablet TAKE 1 TABLET(10 MG) BY MOUTH DAILY   zinc gluconate 50 MG tablet Take 50 mg by mouth daily.   No facility-administered encounter medications on file as of 08/30/2021.    Patient Active Problem List   Diagnosis Date Noted   CKD (chronic kidney disease) stage 3, GFR 30-59 ml/min (HCC) 08/12/2021   Precordial chest pain 03/03/2020   Controlled substance agreement signed 08/16/2018   Myalgia 11/24/2017   Iron deficiency anemia 05/26/2017   Vitamin B 12 deficiency 05/26/2017   Obesity (BMI 30-39.9) 05/25/2017   Constipation 05/19/2016   Hypokalemia 04/30/2015   Depression 07/18/2014   Diabetic neuropathy ([place]) 04/17/2014   OAB (overactive bladder) 04/17/2014   Peripheral neuropathy 04/17/2014   Hypothyroidism 12/27/2012   Osteopenia 05/30/2012   Type 2 diabetes mellitus ([place]) 05/21/2012   Asthma 05/21/2012   Chronic obstructive pulmonary disease ([place]) 05/21/2012   Gastroesophageal reflux disease 05/21/2012   Hyperlipidemia associated with type 2 diabetes  mellitus ([place]) 01/26/2011   Hypertension associated with diabetes ([place])    Anxiety    Chronic coronary artery disease     Conditions to be addressed/monitored:HTN and DMII  Care Plan : [facility] Care Plan  Updates made by Ilean China, RN since 08/30/2021 12:00 AM     Problem: Chroic Disease Management Needs Resolved 08/30/2021  Priority: High  Onset Date: 02/16/2021     Long-Range Goal: Patient will Work with RN Care Manager Regarding [place] with DM, HTN, HLD, COPD, and Depression Completed 08/30/2021  Start Date: 02/16/2021  Expected End Date: 02/16/2022  This Visit's Progress: On track  Recent Progress: On track  Priority: High  Note:   Current Barriers:  Chronic Disease Management support and education needs related to HTN, HLD, COPD, DMII, and depression  RNCM Clinical Goal(s):  Patient will continue to work with RN Care Manager and/or Social Worker to address care  management and care coordination needs related to HTN, HLD, COPD, DMII, and Depression as evidenced by adherence to CM Team Scheduled appointments     through collaboration with RN Care manager, provider, and care team.   Interventions: 1:1 collaboration with primary care provider regarding development and update of comprehensive plan of care as evidenced by provider attestation and co-signature Inter-disciplinary care team collaboration (see longitudinal plan of care) Evaluation of current treatment plan related to  self management and patient's adherence to plan as established by provider Assessed family/social support Assessed mobility and ability to perform ADLs   COPD: (Status: Goal Met.) Long Term Goal  Reviewed and discussed medications Advised patient to track and manage COPD triggers Advised patient to engage in light exercise as tolerated 3-5 days a week to aid in the the management of COPD Discussed the importance of adequate rest and management of fatigue with  COPD Assessed social determinant of health barriers   Diabetes:  (Status: Goal Met.) Long Term Goal  Lab Results  Component Value Date   HGBA1C 6.6 (H) 05/14/2021   HGBA1C 6.4 (H) 01/12/2021   HGBA1C 6.5 07/20/2020   Lab Results  Component Value Date   MICROALBUR 20 04/17/2014   [place] 32 05/14/2021   CREATININE 1.20 (H) 05/14/2021  Assessed patient's understanding of A1c goal: <7% Reviewed and discussed medications No problems with cost because she has Medicare Extra Help  Discussed that she has stopped taking ozempic due to side effects. She felt bad in general and was nauseated all of the time. She feels better since d/c ozempic but blood sugar is not as well controlled. Chart reviewed including relevant office notes and lab results Discussed home blood sugar testing Testing twice a day and as needed Improved readings with medication changes Advised to call PCP with any readings over 300 or with several readings in a row over 200 Advised to bring log to PCP appointment for review Reinforced carb modified diet and verbal education provided on what that is Encouraged to continue getting outside daily for a walk when the weather permits Provided with RNCM contact number and encouraged to reach out as needed Reviewed upcoming appointments Advised to talk with PCP and possibly PharmD regarding ozempic side effects and other possible diabetes treatments   Hyperlipidemia:  (Status: Goal Met.) Long Term Goal  Lab Results  Component Value Date   CHOL 111 05/14/2021   HDL 64 05/14/2021   LDLCALC 32 05/14/2021   TRIG 69 05/14/2021   CHOLHDL 1.7 05/14/2021  Mediations reviewed and importance of compliance discussed Using Repatha injection for HLD control due to statin intolerance  Provider established cholesterol goals reviewed; Counseled on importance of regular laboratory monitoring as prescribed; Reviewed exercise goals and target of 150 minutes per week; Assessed social  determinant of health barriers;  Reviewed and discussed most recent lipid panel results Reviewed upcoming appointments   Hypertension: (Status: Goal Met.) Last practice recorded BP readings:  BP Readings from Last 3 Encounters:  06/23/21 140/90  06/15/21 110/72  05/14/21 136/78  Evaluation of current treatment plan related to hypertension self management and patient's adherence to plan as established by provider;   Reviewed medications with patient and discussed importance of compliance;  Counseled on the importance of exercise goals with target of 150 minutes per week Discussed plans with patient for ongoing care management follow up and provided patient with direct contact information for care management team; Discussed complications of poorly controlled blood pressure such as heart disease, stroke, circulatory  complications, vision complications, kidney impairment, sexual dysfunction;  Assessed social determinant of health barriers;    Patient Goals/Self-Care Activities: Take medications as prescribed   Attend all scheduled provider appointments Perform all self care activities independently  Call provider office for new concerns or questions  Call PCP if you need CCM services [phone]     Plan:No further follow up required: Goals of CCM program have been met. Patient is stable and will be removed from the CCM program at [facility]. She is aware that if the need arises, she can be added back to the program at any time.    Chong Sicilian, BSN, RN-BC Embedded Chronic Care Manager [facility] / [place] [facility] Direct Dial: [phone]

## 2021-08-30 NOTE — Patient Instructions (Signed)
Visit Information  Thank you for taking time to visit with me today. Please don't hesitate to contact me if I can be of assistance to you before our next scheduled telephone appointment.  Following are the goals we discussed today:  Take medications as prescribed   Attend all scheduled provider appointments Perform all self care activities independently  Call provider office for new concerns or questions  Call PCP if you need CCM services [phone]   If you are experiencing a Mental Health or [place] or need someone to talk to, please call the [facility]: [phone] call 911   The patient verbalized understanding of instructions, educational materials, and care plan provided today and agreed to receive a mailed copy of patient instructions, educational materials, and care plan.    Chong Sicilian, BSN, RN-BC Embedded Chronic Care Manager [facility] / [place] [facility] Direct Dial: [phone]

## 2021-08-31 NOTE — Telephone Encounter (Signed)
Last office visit 08/10/21.  Medication is on current med list but not prescribed from our office so I cannot refill without your approval..

## 2021-09-25 ENCOUNTER — Other Ambulatory Visit: Payer: Self-pay | Admitting: Family

## 2021-09-27 DIAGNOSIS — N1831 Chronic kidney disease, stage 3a: Secondary | ICD-10-CM

## 2021-09-27 DIAGNOSIS — I152 Hypertension secondary to endocrine disorders: Secondary | ICD-10-CM

## 2021-09-27 DIAGNOSIS — E1159 Type 2 diabetes mellitus with other circulatory complications: Secondary | ICD-10-CM | POA: Diagnosis not present

## 2021-09-27 DIAGNOSIS — E1142 Type 2 diabetes mellitus with diabetic polyneuropathy: Secondary | ICD-10-CM

## 2021-09-29 ENCOUNTER — Other Ambulatory Visit: Payer: Self-pay | Admitting: Family

## 2021-10-14 ENCOUNTER — Other Ambulatory Visit: Payer: Self-pay | Admitting: *Deleted

## 2021-10-14 NOTE — Patient Outreach (Signed)
  Care Coordination   Initial Visit Note   10/14/2021 Name: Denise Gardner MRN: [id number] DOB: [birth year]  Denise Gardner is a 76 y.o. year old female who sees Hawks, Theador Hawthorne, FNP for primary care. I spoke with  Denise Gardner by phone today  What matters to the patients health and wellness today?  My blood sugars go up high and I have to take vinegar and water or an extra pill . I am starving myself to death. (A1C is 6.3)    Goals Addressed             This Visit's Progress    Develop plan of care for management of diabetes          SDOH assessments and interventions completed:  Yes     Care Coordination Interventions Activated:  Yes  Care Coordination Interventions:  Yes, provided   Follow up plan: Follow up call scheduled for Denise Gardner November 25, 2021 3 PM    Encounter Outcome:  Pt. Visit Completed   [place] [facility] [phone]

## 2021-10-24 ENCOUNTER — Other Ambulatory Visit: Payer: Self-pay | Admitting: Family

## 2021-11-02 ENCOUNTER — Telehealth: Payer: Self-pay | Admitting: Family

## 2021-11-02 MED ORDER — REPATHA SURECLICK 140 MG/ML ~~LOC~~ SOAJ: SUBCUTANEOUS | 2 refills | Status: DC

## 2021-11-02 NOTE — Telephone Encounter (Signed)
Patient wants Evolocumab (REPATHA SURECLICK) 914 MG/ML SOAJ called in for a 16 day suppy.

## 2021-11-02 NOTE — Telephone Encounter (Signed)
Prescription sent to pharmacy.

## 2021-11-12 ENCOUNTER — Encounter: Payer: Self-pay | Admitting: Family

## 2021-11-12 ENCOUNTER — Ambulatory Visit (INDEPENDENT_AMBULATORY_CARE_PROVIDER_SITE_OTHER): Payer: Medicare Other | Admitting: Family

## 2021-11-12 VITALS — BP 136/88 | HR 80 | Temp 97.7°F | Ht 65.0 in | Wt 193.8 lb

## 2021-11-12 DIAGNOSIS — E1142 Type 2 diabetes mellitus with diabetic polyneuropathy: Secondary | ICD-10-CM

## 2021-11-12 DIAGNOSIS — J449 Chronic obstructive pulmonary disease, unspecified: Secondary | ICD-10-CM | POA: Diagnosis not present

## 2021-11-12 DIAGNOSIS — N1831 Chronic kidney disease, stage 3a: Secondary | ICD-10-CM | POA: Diagnosis not present

## 2021-11-12 DIAGNOSIS — E1159 Type 2 diabetes mellitus with other circulatory complications: Secondary | ICD-10-CM | POA: Diagnosis not present

## 2021-11-12 DIAGNOSIS — J45909 Unspecified asthma, uncomplicated: Secondary | ICD-10-CM | POA: Diagnosis not present

## 2021-11-12 DIAGNOSIS — I152 Hypertension secondary to endocrine disorders: Secondary | ICD-10-CM

## 2021-11-12 DIAGNOSIS — D509 Iron deficiency anemia, unspecified: Secondary | ICD-10-CM | POA: Diagnosis not present

## 2021-11-12 DIAGNOSIS — I2510 Atherosclerotic heart disease of native coronary artery without angina pectoris: Secondary | ICD-10-CM

## 2021-11-12 DIAGNOSIS — F419 Anxiety disorder, unspecified: Secondary | ICD-10-CM

## 2021-11-12 DIAGNOSIS — E669 Obesity, unspecified: Secondary | ICD-10-CM

## 2021-11-12 DIAGNOSIS — N3281 Overactive bladder: Secondary | ICD-10-CM

## 2021-11-12 DIAGNOSIS — E538 Deficiency of other specified B group vitamins: Secondary | ICD-10-CM | POA: Diagnosis not present

## 2021-11-12 DIAGNOSIS — E785 Hyperlipidemia, unspecified: Secondary | ICD-10-CM

## 2021-11-12 DIAGNOSIS — F02A4 Dementia in other diseases classified elsewhere, mild, with anxiety: Secondary | ICD-10-CM

## 2021-11-12 DIAGNOSIS — E1169 Type 2 diabetes mellitus with other specified complication: Secondary | ICD-10-CM | POA: Diagnosis not present

## 2021-11-12 DIAGNOSIS — Z79899 Other long term (current) drug therapy: Secondary | ICD-10-CM

## 2021-11-12 DIAGNOSIS — E039 Hypothyroidism, unspecified: Secondary | ICD-10-CM | POA: Diagnosis not present

## 2021-11-12 DIAGNOSIS — K219 Gastro-esophageal reflux disease without esophagitis: Secondary | ICD-10-CM | POA: Diagnosis not present

## 2021-11-12 DIAGNOSIS — F3342 Major depressive disorder, recurrent, in full remission: Secondary | ICD-10-CM

## 2021-11-12 DIAGNOSIS — K5902 Outlet dysfunction constipation: Secondary | ICD-10-CM

## 2021-11-12 DIAGNOSIS — R058 Other specified cough: Secondary | ICD-10-CM

## 2021-11-12 LAB — BAYER DCA HB A1C WAIVED: HB A1C (BAYER DCA - WAIVED): 7.2 % — ABNORMAL HIGH (ref 4.8–5.6)

## 2021-11-12 MED ORDER — BENZONATATE 200 MG PO CAPS: 200.0000 mg | ORAL_CAPSULE | Freq: Three times a day (TID) | ORAL | 1 refills | Status: DC | PRN

## 2021-11-12 MED ORDER — MEMANTINE HCL 5 MG PO TABS: 5.0000 mg | ORAL_TABLET | Freq: Two times a day (BID) | ORAL | 1 refills | Status: DC

## 2021-11-12 MED ORDER — DONEPEZIL HCL 5 MG PO TABS: 5.0000 mg | ORAL_TABLET | Freq: Every day | ORAL | 1 refills | Status: DC

## 2021-11-12 NOTE — Patient Instructions (Signed)

## 2021-11-12 NOTE — Progress Notes (Signed)
Subjective:    Patient ID: Denise Gardner, female    DOB: [birth year], 76 y.o.   MRN: [id number]  Chief Complaint  Patient presents with   Medical Management of Chronic Issues    Wants something to help her remember things    Insomnia    Trouble staying asleep    Pt presents to the office today for chronic follow up. She is followed by  Cardiologists annually for CAD. She is followed by Hematologists  for iron deficiency anemia and Vit B 12 deficiency.   She stopped her Ozempic because of nausea. Her glucose has been >200. She is complaining of burning aching pain of bilateral feet that is a 10 out 10 during night.    Has COPD and has intermittent SOB.   She is complaining of memory issues.  Insomnia Primary symptoms: difficulty falling asleep, frequent awakening, malaise/fatigue.   The current episode started more than one year. The onset quality is gradual. The problem occurs intermittently. Past treatments include nothing. The treatment provided no relief. PMH includes: depression.   Hypertension This is a chronic problem. The current episode started more than 1 year ago. The problem has been resolved since onset. The problem is controlled. Associated symptoms include anxiety, blurred vision, malaise/fatigue, peripheral edema and shortness of breath. Risk factors for coronary artery disease include diabetes mellitus, dyslipidemia, obesity and sedentary lifestyle. The current treatment provides moderate improvement.  Asthma She complains of cough, hoarse voice, shortness of breath and wheezing. This is a chronic problem. The current episode started more than 1 year ago. The problem occurs intermittently. The problem has been waxing and waning. Associated symptoms include heartburn and malaise/fatigue. Her symptoms are alleviated by rest. She reports moderate improvement on treatment. Her past medical history is significant for asthma.  Gastroesophageal Reflux She complains of belching,  coughing, heartburn, a hoarse voice and wheezing. This is a chronic problem. The current episode started more than 1 year ago. The problem occurs occasionally. Risk factors include obesity. She has tried a PPI for the symptoms. The treatment provided moderate relief.  Diabetes She presents for her follow-up diabetic visit. She has type 2 diabetes mellitus. Hypoglycemia symptoms include nervousness/anxiousness. Associated symptoms include blurred vision and foot paresthesias. Symptoms are stable. Diabetic complications include nephropathy and peripheral neuropathy. Pertinent negatives for diabetic complications include no heart disease. Risk factors for coronary artery disease include dyslipidemia, diabetes mellitus, hypertension, sedentary lifestyle and post-menopausal. Her weight is fluctuating dramatically. She is following a generally unhealthy diet. Her overall blood glucose range is >200 mg/dl. Eye exam is not current.  Urinary Frequency  This is a chronic problem. The current episode started more than 1 year ago. The problem occurs intermittently. The problem has been waxing and waning. The patient is experiencing no pain. Associated symptoms include frequency.  Anxiety Presents for follow-up visit. Symptoms include excessive worry, insomnia, irritability, nervous/anxious behavior and shortness of breath. Symptoms occur rarely. The severity of symptoms is mild.   Her past medical history is significant for anemia and asthma.  Constipation This is a chronic problem. The current episode started more than 1 year ago. The problem has been waxing and waning since onset. Her stool frequency is 1 time per day. She has tried laxatives and diet changes for the symptoms. The treatment provided mild relief.  Depression        This is a chronic problem.  The current episode started more than 1 year ago.   The onset quality is gradual.  The problem occurs intermittently.  Associated symptoms include insomnia.   Associated symptoms include no helplessness, no hopelessness and not sad.  Past medical history includes anxiety.   Anemia Presents for follow-up visit. Symptoms include malaise/fatigue.      Review of Systems  Constitutional:  Positive for irritability and malaise/fatigue.  HENT:  Positive for hoarse voice.   Eyes:  Positive for blurred vision.  Respiratory:  Positive for cough, shortness of breath and wheezing.   Gastrointestinal:  Positive for constipation and heartburn.  Genitourinary:  Positive for frequency.  Psychiatric/Behavioral:  Positive for depression. The patient is nervous/anxious and has insomnia.   All other systems reviewed and are negative.      Objective:   Physical Exam Vitals reviewed.  Constitutional:      General: She is not in acute distress.    Appearance: She is well-developed. She is obese.  HENT:     Head: Normocephalic and atraumatic.     Right Ear: Tympanic membrane normal.     Left Ear: Tympanic membrane normal.  Eyes:     Pupils: Pupils are equal, round, and reactive to light.  Neck:     Thyroid: No thyromegaly.  Cardiovascular:     Rate and Rhythm: Normal rate and regular rhythm.     Heart sounds: Normal heart sounds. No murmur heard. Pulmonary:     Effort: Pulmonary effort is normal. No respiratory distress.     Breath sounds: Normal breath sounds. No wheezing.  Abdominal:     General: Bowel sounds are normal. There is no distension.     Palpations: Abdomen is soft.     Tenderness: There is no abdominal tenderness.  Musculoskeletal:        General: Tenderness present.     Cervical back: Normal range of motion and neck supple.     Comments: Pain in lumbar with flexion, using cane  Skin:    General: Skin is warm and dry.  Neurological:     Mental Status: She is alert and oriented to person, place, and time.     Cranial Nerves: No cranial nerve deficit.     Deep Tendon Reflexes: Reflexes are normal and symmetric.  Psychiatric:         Behavior: Behavior normal.        Thought Content: Thought content normal.        Judgment: Judgment normal.       BP 136/88   Pulse 80   Temp 97.7 F (36.5 C) (Temporal)   Ht _0  (1.651 m)   Wt 193 lb 12.8 oz (87.9 kg)   BMI 32.25 kg/m      Assessment & Plan:  Denise Gardner comes in today with chief complaint of Medical Management of Chronic Issues (Wants something to help her remember things/) and Insomnia (Trouble staying asleep )   Diagnosis and orders addressed:  1. Hypertension associated with diabetes ([place]) - CMP14+EGFR  2. Chronic coronary artery disease - CMP14+EGFR  3. Uncomplicated asthma, unspecified asthma severity, unspecified whether persistent - CMP14+EGFR  4. Chronic obstructive pulmonary disease, unspecified COPD type ([place])  - CMP14+EGFR  5. Gastroesophageal reflux disease, unspecified whether esophagitis present - CMP14+EGFR  6. Diabetic polyneuropathy associated with type 2 diabetes mellitus (HCC) - CMP14+EGFR  7. Hypothyroidism, unspecified type  - CMP14+EGFR  8. Type 2 diabetes mellitus with diabetic polyneuropathy, without long-term current use of insulin (HCC) - CMP14+EGFR - Bayer DCA Hb A1c Waived  9. Hyperlipidemia associated with type 2 diabetes mellitus (  [place]) - CMP14+EGFR  10. Stage 3a chronic kidney disease ([place]) - CMP14+EGFR  11. OAB (overactive bladder) - CMP14+EGFR  12. Constipation due to outlet dysfunction - CMP14+EGFR  13. Obesity (BMI 30-39.9) - CMP14+EGFR  14. Vitamin B 12 deficiency - Anemia Profile B - CMP14+EGFR  15. Iron deficiency anemia, unspecified iron deficiency anemia type - Anemia Profile B - CMP14+EGFR  16. Anxiety - CMP14+EGFR  17. Controlled substance agreement signed - CMP14+EGFR  18. Recurrent major depressive disorder, in full remission ([place]) - CMP14+EGFR  19. 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  Dispense: 180 tablet; Refill: 1 - donepezil (ARICEPT) 5 MG tablet; Take 1 tablet (5 mg total) by mouth at bedtime.  Dispense: 90 tablet; Refill: 1  20. Other cough - benzonatate (TESSALON) 200 MG capsule; Take 1 capsule (200 mg total) by mouth 3 (three) times daily as needed.  Dispense: 30 capsule; Refill: 1   Labs pending Health Maintenance reviewed Diet and exercise encouraged  Follow up plan: 2 months to recheck memory    Evelina Dun, FNP

## 2021-11-13 LAB — ANEMIA PROFILE B
Basophils Absolute: 0.1 10*3/uL (ref 0.0–0.2)
Basos: 1 %
EOS (ABSOLUTE): 0.3 10*3/uL (ref 0.0–0.4)
Eos: 2 %
Ferritin: 41 ng/mL (ref 15–150)
Folate: >20 ng/mL (ref >3.0)
Hematocrit: 46.4 % (ref 34.0–46.6)
Hemoglobin: 14.9 g/dL (ref 11.1–15.9)
Immature Grans (Abs): 0 10*3/uL (ref 0.0–0.1)
Immature Granulocytes: 0 %
Iron Saturation: 16 % (ref 15–55)
Iron: 59 ug/dL (ref 27–139)
Lymphocytes Absolute: 2.7 10*3/uL (ref 0.7–3.1)
Lymphs: 20 %
MCH: 28.5 pg (ref 26.6–33.0)
MCHC: 32.1 g/dL (ref 31.5–35.7)
MCV: 89 fL (ref 79–97)
Monocytes Absolute: 0.9 10*3/uL (ref 0.1–0.9)
Monocytes: 7 %
Neutrophils Absolute: 9.8 10*3/uL — ABNORMAL HIGH (ref 1.4–7.0)
Neutrophils: 70 %
Platelets: 225 10*3/uL (ref 150–450)
RBC: 5.22 x10E6/uL (ref 3.77–5.28)
RDW: 13.1 % (ref 11.7–15.4)
Retic Ct Pct: 1.1 % (ref 0.6–2.6)
Total Iron Binding Capacity: 362 ug/dL (ref 250–450)
UIBC: 303 ug/dL (ref 118–369)
Vitamin B-12: 623 pg/mL (ref 232–1245)
WBC: 13.9 10*3/uL — ABNORMAL HIGH (ref 3.4–10.8)

## 2021-11-13 LAB — CMP14+EGFR
ALT: 17 IU/L (ref 0–32)
AST: 19 IU/L (ref 0–40)
Albumin/Globulin Ratio: 1.8 (ref 1.2–2.2)
Albumin: 4.3 g/dL (ref 3.8–4.8)
Alkaline Phosphatase: 47 IU/L (ref 44–121)
BUN/Creatinine Ratio: 14 (ref 12–28)
BUN: 20 mg/dL (ref 8–27)
Bilirubin Total: 0.5 mg/dL (ref 0.0–1.2)
CO2: 24 mmol/L (ref 20–29)
Calcium: 9.9 mg/dL (ref 8.7–10.3)
Chloride: 100 mmol/L (ref 96–106)
Creatinine, Ser: 1.39 mg/dL — ABNORMAL HIGH (ref 0.57–1.00)
Globulin, Total: 2.4 g/dL (ref 1.5–4.5)
Glucose: 165 mg/dL — ABNORMAL HIGH (ref 70–99)
Potassium: 4.4 mmol/L (ref 3.5–5.2)
Sodium: 141 mmol/L (ref 134–144)
Total Protein: 6.7 g/dL (ref 6.0–8.5)
eGFR: 39 mL/min/{1.73_m2} — ABNORMAL LOW (ref >59)

## 2021-11-15 ENCOUNTER — Other Ambulatory Visit: Payer: Self-pay | Admitting: Family

## 2021-11-18 NOTE — Addendum Note (Signed)
Addended by: Renda Rolls A on: 11/18/2021 09:10 AM   Modules accepted: Orders

## 2021-11-23 ENCOUNTER — Other Ambulatory Visit: Payer: Self-pay | Admitting: Family

## 2021-11-23 DIAGNOSIS — K219 Gastro-esophageal reflux disease without esophagitis: Secondary | ICD-10-CM

## 2021-11-25 ENCOUNTER — Ambulatory Visit: Payer: Self-pay | Admitting: *Deleted
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
## 2021-11-30 NOTE — Patient Instructions (Signed)
Visit Information  Thank you for taking time to visit with me today. Please don't hesitate to contact me if I can be of assistance to you.   Following are the goals we discussed today:   Goals Addressed               This Visit's Progress     Patient Stated     Develop plan of care for management of diabetes ([facility]) (pt-stated)        Care Coordination Interventions: Discussed plans with patient for ongoing care management follow up and provided patient with direct contact information for care management team Review of patient status, including review of consultants reports, relevant laboratory and other test results, and medications completed Screening for signs and symptoms of depression related to chronic disease state  Assessed social determinant of health barriers      Manage depression (pt-stated)        Care Coordination Interventions: Evaluation of current treatment plan related to depression and patient's adherence to plan as established by provider Screening for signs and symptoms of depression related to chronic disease state  Assessed social determinant of health barriers      manage kidney disease ([facility]N) (pt-stated)        Care Coordination Interventions: Assessed the Patient understanding of chronic kidney disease    Evaluation of current treatment plan related to chronic kidney disease self management and patient's adherence to plan as established by provider      Reviewed medications with patient and discussed importance of compliance    Advised patient, providing education and rationale, to monitor blood pressure daily and record, calling PCP for findings outside established parameters    Discussed plans with patient for ongoing care management follow up and provided patient with direct contact information for care management team    Screening for signs and symptoms of depression related to chronic disease state      Assessed social determinant of health barriers            Our next appointment is by telephone on 12/23/21 at 3:30 pm  Please call the care guide team at [phone] if you need to cancel or reschedule your appointment.   If you are experiencing a Mental Health or [place] or need someone to talk to, please call the Suicide and Crisis Lifeline: 988 call the [place] National Suicide Prevention Lifeline: 980-691-3320 or TTY: 272-786-7737 TTY (986)457-1629) to talk to a trained counselor call 1-800-273-TALK (toll free, 24 hour hotline) call the [facility]: 838 603 4549 call 911   The patient verbalized understanding of instructions, educational materials, and care plan provided today and DECLINED offer to receive copy of patient instructions, educational materials, and care plan.   The patient has been provided with contact information for the care management team and has been advised to call with any health related questions or concerns.   T[place]ul[place] Lavina Hamman, RN, BSN, [place] Coordinator Office number [phone]

## 2021-12-07 ENCOUNTER — Other Ambulatory Visit: Payer: Self-pay | Admitting: Family

## 2021-12-07 ENCOUNTER — Other Ambulatory Visit: Payer: Medicare Other

## 2021-12-07 DIAGNOSIS — D509 Iron deficiency anemia, unspecified: Secondary | ICD-10-CM

## 2021-12-07 DIAGNOSIS — E538 Deficiency of other specified B group vitamins: Secondary | ICD-10-CM | POA: Diagnosis not present

## 2021-12-09 LAB — CBC WITH DIFFERENTIAL/PLATELET
Basophils Absolute: 0.1 10*3/uL (ref 0.0–0.2)
Basos: 1 %
EOS (ABSOLUTE): 0.4 10*3/uL (ref 0.0–0.4)
Eos: 5 %
Hematocrit: 43.5 % (ref 34.0–46.6)
Hemoglobin: 14 g/dL (ref 11.1–15.9)
Immature Grans (Abs): 0 10*3/uL (ref 0.0–0.1)
Immature Granulocytes: 0 %
Lymphocytes Absolute: 2.6 10*3/uL (ref 0.7–3.1)
Lymphs: 34 %
MCH: 28.3 pg (ref 26.6–33.0)
MCHC: 32.2 g/dL (ref 31.5–35.7)
MCV: 88 fL (ref 79–97)
Monocytes Absolute: 0.8 10*3/uL (ref 0.1–0.9)
Monocytes: 11 %
Neutrophils Absolute: 3.7 10*3/uL (ref 1.4–7.0)
Neutrophils: 49 %
Platelets: 212 10*3/uL (ref 150–450)
RBC: 4.95 x10E6/uL (ref 3.77–5.28)
RDW: 13.4 % (ref 11.7–15.4)
WBC: 7.6 10*3/uL (ref 3.4–10.8)

## 2021-12-09 LAB — COMPREHENSIVE METABOLIC PANEL
ALT: 15 IU/L (ref 0–32)
AST: 17 IU/L (ref 0–40)
Albumin/Globulin Ratio: 1.7 (ref 1.2–2.2)
Albumin: 4 g/dL (ref 3.8–4.8)
Alkaline Phosphatase: 42 IU/L — ABNORMAL LOW (ref 44–121)
BUN/Creatinine Ratio: 19 (ref 12–28)
BUN: 24 mg/dL (ref 8–27)
Bilirubin Total: 0.4 mg/dL (ref 0.0–1.2)
CO2: 26 mmol/L (ref 20–29)
Calcium: 9.5 mg/dL (ref 8.7–10.3)
Chloride: 99 mmol/L (ref 96–106)
Creatinine, Ser: 1.26 mg/dL — ABNORMAL HIGH (ref 0.57–1.00)
Globulin, Total: 2.4 g/dL (ref 1.5–4.5)
Glucose: 149 mg/dL — ABNORMAL HIGH (ref 70–99)
Potassium: 4.3 mmol/L (ref 3.5–5.2)
Sodium: 138 mmol/L (ref 134–144)
Total Protein: 6.4 g/dL (ref 6.0–8.5)
eGFR: 44 mL/min/{1.73_m2} — ABNORMAL LOW (ref >59)

## 2021-12-09 LAB — IRON AND TIBC
Iron Saturation: 19 % (ref 15–55)
Iron: 60 ug/dL (ref 27–139)
Total Iron Binding Capacity: 318 ug/dL (ref 250–450)
UIBC: 258 ug/dL (ref 118–369)

## 2021-12-09 LAB — METHYLMALONIC ACID, SERUM: Methylmalonic Acid: 183 nmol/L (ref 0–378)

## 2021-12-09 LAB — VITAMIN B12: Vitamin B-12: 477 pg/mL (ref 232–1245)

## 2021-12-09 LAB — FERRITIN: Ferritin: 36 ng/mL (ref 15–150)

## 2021-12-17 ENCOUNTER — Telehealth: Payer: Medicare Other | Admitting: Physician Assistant

## 2021-12-22 ENCOUNTER — Encounter: Payer: Self-pay | Admitting: Physician Assistant

## 2021-12-22 ENCOUNTER — Inpatient Hospital Stay: Payer: Medicare Other | Attending: Physician Assistant | Admitting: Physician Assistant

## 2021-12-22 VITALS — Wt 195.0 lb

## 2021-12-22 DIAGNOSIS — E538 Deficiency of other specified B group vitamins: Secondary | ICD-10-CM | POA: Diagnosis not present

## 2021-12-22 DIAGNOSIS — D509 Iron deficiency anemia, unspecified: Secondary | ICD-10-CM | POA: Diagnosis not present

## 2021-12-22 NOTE — Progress Notes (Signed)
Virtual Visit via Telephone Note [facility]  I connected with [name]  on 12/22/2021 at 11:27 AM by telephone and verified that I am speaking with the correct person using two identifiers.  Location: Patient: Home Provider: Home office   I discussed the limitations, risks, security and privacy concerns of performing an evaluation and management service by telephone and the availability of in person appointments. I also discussed with the patient that there may be a patient responsible charge related to this service. The patient expressed understanding and agreed to proceed.  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 76 y.o. follows at our clinic for her normocytic anemia, secondary to CKD, iron deficiency, and B12 deficiency.  She was last evaluated via telemedicine visit by Tarri Abernethy PA-C on 06/11/2021.  She reports that overall, she is feeling somewhat poorly in the setting of chronic fatigue.  She complains of chronic headaches, fatigue, poor sleep quality, urinary frequency, current exacerbation of seasonal allergies, and easy bruising in the setting of aspirin 81 mg daily.  She has occasional scant rectal bleeding on tissue from hemorrhoids.  She denies any melena or gross hematochezia.  No pica, restless legs, chest pain, or dyspnea.  She is taking her iron tablet every other day with stool softener.  She stopped her vitamin B12 supplement as instructed at her visit in April 2023.  She has little energy and 100% appetite. She reports that she is maintaining stable weight at this time.     OBSERVATIONS/OBJECTIVE: Review of Systems  Constitutional:  Positive for malaise/fatigue. Negative for chills, diaphoresis, fever and weight loss.  HENT:   Positive for congestion.   Respiratory:  Negative for cough and shortness of breath.   Cardiovascular:  Negative for chest pain and palpitations.  Gastrointestinal:  Negative for abdominal pain, blood in stool, melena, nausea and vomiting.  Genitourinary:  Positive for frequency.  Neurological:  Positive for headaches. Negative for dizziness.  Endo/Heme/Allergies:  Bruises/bleeds easily.  Psychiatric/Behavioral:  The patient has insomnia.      PHYSICAL EXAM (per limitations of virtual telephone visit): The patient is alert and oriented x 3, exhibiting adequate mentation, good mood, and ability to speak in full sentences and execute sound judgement.   ASSESSMENT & PLAN: 1.  Normocytic anemia with iron deficiency, B12 deficiency, and CKD 3 - Patient was previously anemic (hemoglobin as low as 10.4 on 05/25/2017), secondary to iron deficiency, CKD 3, and B12 deficiency. - Reports negative stool cards - Last colonoscopy in May 2017, unable to view results - After repleting her iron and nutritional deficiencies, her blood count has been in the normal range over the past 2 years.   - She last had Feraheme on 07/05/2017 and 07/12/2017. - She has been taking ferrous sulfate every other day along with stool softener    - Vitamin B12 (500 mcg) was discontinued in April 2023   - Most recent labs (12/07/2021): CBC normal with Hgb 14.0, ferritin 36, iron saturation 19%.   Normal B12 at 477, normal MMA.  CMP with baseline CKD stage IIIa/B. - She denies any bleeding per rectum or melena      - She has significant fatigue, weakness, and headaches   -- Discussed with patient that her myriad of symptoms is unrelated  to her history of iron deficiency anemia and recommended that she follow-up with her primary care provider for further input - PLAN: Recommend increasing oral iron supplementation to DAILY - No indication to restart B12 supplement at this time  - Repeat labs at [facility] in 6 months  -- RTC in 6  months for office visit    I discussed the assessment and treatment plan with the patient. The patient was provided an opportunity to ask questions and all were answered. The patient agreed with the plan and demonstrated an understanding of the instructions.   The patient was advised to call back or seek an in-person evaluation if the symptoms worsen or if the condition fails to improve as anticipated.  I provided 22 minutes of non-face-to-face time during this encounter.   Harriett Rush, PA-C 12/22/2021 2:05 PM

## 2021-12-23 ENCOUNTER — Ambulatory Visit: Payer: Self-pay | Admitting: *Deleted

## 2021-12-23 ENCOUNTER — Other Ambulatory Visit: Payer: Self-pay | Admitting: Family

## 2021-12-23 NOTE — Patient Outreach (Signed)
  Care Coordination   12/23/2021 Name: Denise Gardner MRN: [id number] DOB: [birth year]   Care Coordination Outreach Attempts:  An unsuccessful telephone outreach was attempted today to offer the patient information about available care coordination services as a benefit of their health plan.   Follow Up Plan:  Additional outreach attempts will be made to offer the patient care coordination information and services.   Encounter Outcome:  No Answer  Care Coordination Interventions Activated:  No   Care Coordination Interventions:  No, not indicated    Casy Tavano L. Lavina Hamman, RN, BSN, [place] Coordinator Office number [phone]

## 2021-12-23 NOTE — Patient Outreach (Signed)
  Care Coordination   Follow Up Visit Note   12/23/2021 Name: Dimitra Woodstock MRN: [id number] DOB: [birth year]  Jing Howatt is a 76 y.o. year old female who sees Sharion Balloon, FNP for primary care. I spoke with  [name] by phone today.  What matters to the patients health and wellness today?  Stopped taking her nerve pill  Want to decrease her medication list Follow up with on oncology appt reviewed intake of red meat, caution with daily green leafy vegetable related to  vitamin k decreasing the anticoagulant  She has purchased a hamburger   diabetes Forgot to check her cbg today as she had to go to the MD      Goals Addressed   None     SDOH assessments and interventions completed:  Yes     Care Coordination Interventions Activated:  Yes  Care Coordination Interventions:  Yes, provided   Follow up plan: Follow up call scheduled for 02/24/22    Encounter Outcome:  Pt. Visit Completed    Seena Ritacco L. Lavina Hamman, RN, BSN, [place] Coordinator Office number [phone]

## 2022-01-13 ENCOUNTER — Ambulatory Visit (INDEPENDENT_AMBULATORY_CARE_PROVIDER_SITE_OTHER): Payer: Medicare Other | Admitting: Family

## 2022-01-13 ENCOUNTER — Encounter: Payer: Self-pay | Admitting: Family

## 2022-01-13 VITALS — BP 156/76 | HR 76 | Temp 97.6°F | Ht 65.0 in | Wt 197.0 lb

## 2022-01-13 DIAGNOSIS — E1142 Type 2 diabetes mellitus with diabetic polyneuropathy: Secondary | ICD-10-CM

## 2022-01-13 DIAGNOSIS — J45909 Unspecified asthma, uncomplicated: Secondary | ICD-10-CM | POA: Diagnosis not present

## 2022-01-13 DIAGNOSIS — E785 Hyperlipidemia, unspecified: Secondary | ICD-10-CM | POA: Diagnosis not present

## 2022-01-13 DIAGNOSIS — I152 Hypertension secondary to endocrine disorders: Secondary | ICD-10-CM

## 2022-01-13 DIAGNOSIS — E039 Hypothyroidism, unspecified: Secondary | ICD-10-CM | POA: Diagnosis not present

## 2022-01-13 DIAGNOSIS — Z79899 Other long term (current) drug therapy: Secondary | ICD-10-CM | POA: Diagnosis not present

## 2022-01-13 DIAGNOSIS — E1159 Type 2 diabetes mellitus with other circulatory complications: Secondary | ICD-10-CM

## 2022-01-13 DIAGNOSIS — E669 Obesity, unspecified: Secondary | ICD-10-CM

## 2022-01-13 DIAGNOSIS — E1169 Type 2 diabetes mellitus with other specified complication: Secondary | ICD-10-CM

## 2022-01-13 DIAGNOSIS — J449 Chronic obstructive pulmonary disease, unspecified: Secondary | ICD-10-CM

## 2022-01-13 DIAGNOSIS — F03A0 Unspecified dementia, mild, without behavioral disturbance, psychotic disturbance, mood disturbance, and anxiety: Secondary | ICD-10-CM | POA: Diagnosis not present

## 2022-01-13 MED ORDER — MEMANTINE HCL 10 MG PO TABS: 10.0000 mg | ORAL_TABLET | Freq: Two times a day (BID) | ORAL | 1 refills | Status: DC

## 2022-01-13 MED ORDER — DONEPEZIL HCL 10 MG PO TABS: 10.0000 mg | ORAL_TABLET | Freq: Every day | ORAL | 1 refills | Status: DC

## 2022-01-13 MED ORDER — TIRZEPATIDE 2.5 MG/0.5ML ~~LOC~~ SOAJ: 2.5000 mg | SUBCUTANEOUS | 1 refills | Status: DC

## 2022-01-13 MED ORDER — GABAPENTIN 600 MG PO TABS: ORAL_TABLET | ORAL | 2 refills | Status: DC

## 2022-01-13 NOTE — Progress Notes (Signed)
Subjective:    Patient ID: Denise Gardner, female    DOB: 1945/03/31, 76 y.o.   MRN: [id number]  Chief Complaint  Patient presents with   Memory Loss   Pt presents to the office today for chronic follow up. She is followed by  Cardiologists annually for CAD. She is followed by Hematologists  for iron deficiency anemia and Vit B 12 deficiency.   She stopped her Ozempic because of nausea. Her glucose has been >170. She is complaining of burning aching pain of bilateral feet that is a 10 out 10 during night.    Has COPD and has intermittent SOB.    She is complaining of memory issues. She was started Namenda 5 mg BID and Aricept 5 mg. Diabetes She presents for her follow-up diabetic visit. She has type 2 diabetes mellitus. There are no hypoglycemic associated symptoms. Associated symptoms include blurred vision and foot paresthesias. Diabetic complications include heart disease and peripheral neuropathy. Risk factors for coronary artery disease include dyslipidemia, diabetes mellitus, hypertension, post-menopausal and sedentary lifestyle. She is following a generally unhealthy diet. Her overall blood glucose range is 140-180 mg/dl.      Review of Systems  Eyes:  Positive for blurred vision.  All other systems reviewed and are negative.      Objective:   Physical Exam Vitals reviewed.  Constitutional:      General: She is not in acute distress.    Appearance: She is well-developed. She is obese.  HENT:     Head: Normocephalic and atraumatic.     Right Ear: Tympanic membrane normal.     Left Ear: Tympanic membrane normal.  Eyes:     Pupils: Pupils are equal, round, and reactive to light.  Neck:     Thyroid: No thyromegaly.  Cardiovascular:     Rate and Rhythm: Normal rate and regular rhythm.     Heart sounds: Normal heart sounds. No murmur heard. Pulmonary:     Effort: Pulmonary effort is normal. No respiratory distress.     Breath sounds: Normal breath sounds. No wheezing.   Abdominal:     General: Bowel sounds are normal. There is no distension.     Palpations: Abdomen is soft.     Tenderness: There is no abdominal tenderness.  Musculoskeletal:        General: No tenderness. Normal range of motion.     Cervical back: Normal range of motion and neck supple.  Skin:    General: Skin is warm and dry.  Neurological:     Mental Status: She is alert and oriented to person, place, and time.     Cranial Nerves: No cranial nerve deficit.     Motor: Weakness (using cane to walk) present.     Gait: Gait abnormal.     Deep Tendon Reflexes: Reflexes are normal and symmetric.  Psychiatric:        Behavior: Behavior normal.        Thought Content: Thought content normal.        Judgment: Judgment normal.    Diabetic Foot Exam - Simple   Simple Foot Form Diabetic Foot exam was performed with the following findings: Yes 01/13/2022 11:19 AM  Visual Inspection See comments: Yes Sensation Testing Intact to touch and monofilament testing bilaterally: Yes Pulse Check Posterior Tibialis and Dorsalis pulse intact bilaterally: Yes Comments Callus formation on great toenail, nail fungus and thickness bilaterally.        BP (!) 156/76   Pulse 76  Temp 97.6 F (36.4 C) (Temporal)   Ht _0  (1.651 m)   Wt 197 lb (89.4 kg)   BMI 32.78 kg/m      Assessment & Plan:  Denise Gardner comes in today with chief complaint of Memory Loss   Diagnosis and orders addressed:  1. Hypertension associated with diabetes ([place]) - CMP14+EGFR  2. Type 2 diabetes mellitus with diabetic polyneuropathy, without long-term current use of insulin (HCC) Will start Mounjaro 2.5 mg  Stop Januvia  Low carb diet - tirzepatide (MOUNJARO) 2.5 MG/0.5ML Pen; Inject 2.5 mg into the skin once a week.  Dispense: 2 mL; Refill: 1 - CMP14+EGFR  3. Hyperlipidemia associated with type 2 diabetes mellitus (HCC) - CMP14+EGFR  4. Uncomplicated asthma, unspecified asthma severity, unspecified  whether persistent - CMP14+EGFR  5. Obesity (BMI 30-39.9) - CMP14+EGFR  6. Hypothyroidism, unspecified type - CMP14+EGFR  7. Diabetic polyneuropathy associated with type 2 diabetes mellitus (HCC) Will change gabapentin to 600 mg in AM and 1200 mg in evening from 300 mg AM and 900 mg at bed time - gabapentin (NEURONTIN) 600 MG tablet; Take 600 mg in AM and 1200 mg at bedtime  Dispense: 90 tablet; Refill: 2 - CMP14+EGFR  8. Chronic obstructive pulmonary disease, unspecified COPD type ([place])  - CMP14+EGFR  9. Controlled substance agreement signed - CMP14+EGFR  10. Mild dementia without behavioral disturbance, psychotic disturbance, mood disturbance, or anxiety, unspecified dementia type (HCC) Will increase Aricept to 10 mg from 5 mg  Will increase Namenda to 10 mg from 5 mg  - donepezil (ARICEPT) 10 MG tablet; Take 1 tablet (10 mg total) by mouth at bedtime.  Dispense: 90 tablet; Refill: 1 - memantine (NAMENDA) 10 MG tablet; Take 1 tablet (10 mg total) by mouth 2 (two) times daily.  Dispense: 180 tablet; Refill: 1 - CMP14+EGFR   Labs pending Health Maintenance reviewed Diet and exercise encouraged  Follow up plan: 1 months to follow up on DM, Memory, and diabetic neuropathy    Evelina Dun, FNP

## 2022-01-13 NOTE — Patient Instructions (Signed)
Diabetic Neuropathy Diabetic neuropathy refers to nerve damage that is caused by diabetes. Over time, people with diabetes can develop nerve damage throughout the body. There are several types of diabetic neuropathy: Peripheral neuropathy. This is the most common type of diabetic neuropathy. It damages the nerves that carry signals between the spinal cord and other parts of the body (peripheral nerves). This usually affects nerves in the feet, legs, hands, and arms. Autonomic neuropathy. This type causes damage to nerves that control involuntary functions (autonomic nerves). Involuntary functions are functions of the body that you do not control. They include heartbeat, body temperature, blood pressure, urination, digestion, sweating, sexual function, or response to changes in blood glucose. Focal neuropathy. This type of nerve damage affects one area of the body, such as an arm, a leg, or the face. The injury may involve one nerve or a small group of nerves. Focal neuropathy can be painful and unpredictable. It occurs most often in older adults with diabetes. This often develops suddenly, but usually improves over time and does not cause long-term problems. Proximal neuropathy. This type of nerve damage affects the nerves of the thighs, hips, buttocks, or legs. It causes severe pain, weakness, and muscle death (atrophy), usually in the thigh muscles. It is more common among older men and people who have type 2 diabetes. The length of recovery time may vary. What are the causes? Peripheral, autonomic, and focal neuropathies are caused by diabetes that is not well controlled with treatment. The cause of proximal neuropathy is not known, but it may be caused by inflammation related to uncontrolled blood glucose levels. What are the signs or symptoms? Peripheral neuropathy Peripheral neuropathy develops slowly over time. When the nerves of the feet and legs no longer work, you may experience: Burning,  stabbing, or aching pain in the legs or feet. Pain or cramping in the legs or feet. Loss of feeling (numbness) and inability to feel pressure or pain in the feet. This can lead to: Thick calluses or sores on areas of constant pressure. Ulcers. Reduced ability to feel temperature changes. Foot deformities. Muscle weakness. Loss of balance or coordination. Autonomic neuropathy The symptoms of autonomic neuropathy vary depending on which nerves are affected. Symptoms may include: Problems with digestion, such as: Nausea or vomiting. Poor appetite. Bloating. Diarrhea or constipation. Trouble swallowing. Losing weight without trying to. Problems with the heart, blood, and lungs, such as: Dizziness, especially when standing up. Fainting. Shortness of breath. Irregular heartbeat. Bladder problems, such as: Trouble starting or stopping urination. Leaking urine. Trouble emptying the bladder. Urinary tract infections (UTIs). Problems with other body functions, such as: Sweat. You may sweat too much or too little. Temperature. You might get hot easily. Or, you might feel cold more than usual. Sexual function. Men may not be able to get or maintain an erection. Women may have vaginal dryness and difficulty with arousal. Focal neuropathy Symptoms affect only one area of the body. Common symptoms include: Numbness. Tingling. Burning pain. Prickling feeling. Very sensitive skin. Weakness. Inability to move (paralysis). Muscle twitching. Muscles getting smaller (wasting). Poor coordination. Double or blurred vision. Proximal neuropathy Sudden, severe pain in the hip, thigh, or buttocks. Pain may spread from the back into the legs (sciatica). Pain and numbness in the arms and legs. Tingling. Loss of bladder control or bowel control. Weakness and wasting of thigh muscles. Difficulty getting up from a seated position. Abdominal swelling. Unexplained weight loss. How is this  diagnosed? Diagnosis varies depending on the type   of neuropathy your health care provider suspects. Peripheral neuropathy Your health care provider will do a neurologic exam. This exam checks your reflexes, how you move, and what you can feel. You may have other tests, such as: Blood tests. Tests of the fluid that surrounds the spinal cord (lumbar puncture). CT scan. MRI. Checking the nerves that control muscles (electromyogram, or EMG). Checking how quickly signals pass through your nerves (nerve conduction study). Checking a small piece of a nerve using a microscope (biopsy). Autonomic neuropathy You may have tests, such as: Tests to measure your blood pressure and heart rate. You may be secured to an exam table that moves you from a lying position to an upright position (table tilt test). Breathing tests to check your lungs. Tests to check how food moves through the digestive system (gastric emptying tests). Blood, sweat, or urine tests. Ultrasound of your bladder. Spinal fluid tests. Focal neuropathy This condition may be diagnosed with: A neurologic exam. CT scan. MRI. EMG. Nerve conduction study. Proximal neuropathy There is no test to diagnose this type of neuropathy. You may have tests to rule out other possible causes of this type of neuropathy. Tests may include: X-rays of your spine and lumbar region. Lumbar puncture. MRI. How is this treated? The goal of treatment is to keep nerve damage from getting worse. Treatment may include: Following your diabetes management plan. This will help keep your blood glucose level and your A1C level within your target range. This is the most important treatment. Using prescription pain medicine. Follow these instructions at home: Diabetes management Follow your diabetes management plan as told by your health care provider. Check your blood glucose levels. Keep your blood glucose in your target range. Have your A1C level checked at  least two times a year, or as often as told. Take over the counter and prescription medicines only as told by your health care provider. This includes insulin and diabetes medicine.  Lifestyle  Do not use any products that contain nicotine or tobacco, such as cigarettes, e-cigarettes, and chewing tobacco. If you need help quitting, ask your health care provider. Be physically active every day. Include strength training and balance exercises. Follow a healthy meal plan. Work with your health care provider to manage your blood pressure. General instructions Ask your health care provider if the medicine prescribed to you requires you to avoid driving or using machinery. Check your skin and feet every day for cuts, bruises, redness, blisters, or sores. Keep all follow-up visits. This is important. Contact a health care provider if: You have burning, stabbing, or aching pain in your legs or feet. You are unable to feel pressure or pain in your feet. You develop problems with digestion, such as: Nausea. Vomiting. Bloating. Constipation. Diarrhea. Abdominal pain. You have difficulty with urination, such as: Inability to control when you urinate (incontinence). Inability to completely empty the bladder (retention). You feel as if your heart is racing (palpitations). You feel dizzy, weak, or faint when you stand up. Get help right away if: You cannot urinate. You have sudden weakness or loss of coordination. You have trouble speaking. You have pain or pressure in your chest. You have an irregular heartbeat. You have sudden inability to move a part of your body. These symptoms may represent a serious problem that is an emergency. Do not wait to see if the symptoms will go away. Get medical help right away. Call your local emergency services (911 in the [place] Do not drive yourself to   the hospital. Summary Diabetic neuropathy is nerve damage that is caused by diabetes. It can cause numbness  and pain in the arms, legs, digestive tract, heart, and other body systems. This condition is treated by keeping your blood glucose level and your A1C level within your target range. This can help prevent neuropathy from getting worse. Check your skin and feet every day for cuts, bruises, redness, blisters, or sores. Do not use any products that contain nicotine or tobacco, such as cigarettes, e-cigarettes, and chewing tobacco. This information is not intended to replace advice given to you by your health care provider. Make sure you discuss any questions you have with your health care provider. Document Revised: 06/27/2019 Document Reviewed: 06/27/2019 Elsevier Patient Education  [place].

## 2022-01-14 LAB — CMP14+EGFR
ALT: 16 IU/L (ref 0–32)
AST: 18 IU/L (ref 0–40)
Albumin/Globulin Ratio: 1.9 (ref 1.2–2.2)
Albumin: 4.4 g/dL (ref 3.8–4.8)
Alkaline Phosphatase: 48 IU/L (ref 44–121)
BUN/Creatinine Ratio: 13 (ref 12–28)
BUN: 15 mg/dL (ref 8–27)
Bilirubin Total: 0.4 mg/dL (ref 0.0–1.2)
CO2: 27 mmol/L (ref 20–29)
Calcium: 9.6 mg/dL (ref 8.7–10.3)
Chloride: 98 mmol/L (ref 96–106)
Creatinine, Ser: 1.17 mg/dL — ABNORMAL HIGH (ref 0.57–1.00)
Globulin, Total: 2.3 g/dL (ref 1.5–4.5)
Glucose: 148 mg/dL — ABNORMAL HIGH (ref 70–99)
Potassium: 3.9 mmol/L (ref 3.5–5.2)
Sodium: 139 mmol/L (ref 134–144)
Total Protein: 6.7 g/dL (ref 6.0–8.5)
eGFR: 48 mL/min/{1.73_m2} — ABNORMAL LOW (ref >59)

## 2022-01-15 LAB — MICROALBUMIN / CREATININE URINE RATIO
Creatinine, Urine: 22 mg/dL
Microalb/Creat Ratio: <14 mg/g creat (ref 0–29)
Microalbumin, Urine: <3 ug/mL

## 2022-01-19 ENCOUNTER — Telehealth: Payer: Self-pay | Admitting: Family

## 2022-01-19 NOTE — Telephone Encounter (Signed)
Patient is aware that her labs from 11/16 were stable but would like to discuss further with a nurse. Please call back

## 2022-01-19 NOTE — Telephone Encounter (Signed)
Patient aware and verbalized understanding. °

## 2022-01-22 ENCOUNTER — Other Ambulatory Visit: Payer: Self-pay | Admitting: Family

## 2022-01-22 DIAGNOSIS — I2510 Atherosclerotic heart disease of native coronary artery without angina pectoris: Secondary | ICD-10-CM

## 2022-01-24 ENCOUNTER — Telehealth: Payer: Self-pay | Admitting: Family

## 2022-01-24 NOTE — Telephone Encounter (Signed)
Patient calling because she wants to know if memantine (NAMENDA) 10 MG tablet can make her feel dizzy or sick if she does not eat. Please call back and advise.

## 2022-01-24 NOTE — Telephone Encounter (Signed)
Take namenda with food. If sickness continues we may need to decrease to 5 mg.

## 2022-01-24 NOTE — Telephone Encounter (Signed)
Patient aware.

## 2022-02-07 ENCOUNTER — Other Ambulatory Visit: Payer: Self-pay | Admitting: Family

## 2022-02-07 DIAGNOSIS — E1149 Type 2 diabetes mellitus with other diabetic neurological complication: Secondary | ICD-10-CM

## 2022-02-10 ENCOUNTER — Other Ambulatory Visit: Payer: Self-pay | Admitting: Family

## 2022-02-10 DIAGNOSIS — Z1231 Encounter for screening mammogram for malignant neoplasm of breast: Secondary | ICD-10-CM

## 2022-02-12 ENCOUNTER — Other Ambulatory Visit: Payer: Self-pay | Admitting: Family

## 2022-02-12 DIAGNOSIS — E1159 Type 2 diabetes mellitus with other circulatory complications: Secondary | ICD-10-CM

## 2022-02-12 DIAGNOSIS — I152 Hypertension secondary to endocrine disorders: Secondary | ICD-10-CM

## 2022-02-14 ENCOUNTER — Ambulatory Visit: Payer: Medicare Other | Admitting: Family

## 2022-02-15 ENCOUNTER — Encounter: Payer: Self-pay | Admitting: Family

## 2022-02-15 ENCOUNTER — Ambulatory Visit (INDEPENDENT_AMBULATORY_CARE_PROVIDER_SITE_OTHER): Payer: Medicare Other | Admitting: Family

## 2022-02-15 ENCOUNTER — Ambulatory Visit
Admission: RE | Admit: 2022-02-15 | Discharge: 2022-02-15 | Disposition: A | Discharge status: Home / Self Care | Payer: Medicare Other | Source: Ambulatory Visit | Attending: Family | Admitting: Family

## 2022-02-15 VITALS — BP 134/75 | HR 86 | Temp 97.5°F | Ht 65.0 in | Wt 194.0 lb

## 2022-02-15 DIAGNOSIS — E1142 Type 2 diabetes mellitus with diabetic polyneuropathy: Secondary | ICD-10-CM

## 2022-02-15 DIAGNOSIS — R42 Dizziness and giddiness: Secondary | ICD-10-CM

## 2022-02-15 DIAGNOSIS — F02A4 Dementia in other diseases classified elsewhere, mild, with anxiety: Secondary | ICD-10-CM | POA: Diagnosis not present

## 2022-02-15 DIAGNOSIS — E1159 Type 2 diabetes mellitus with other circulatory complications: Secondary | ICD-10-CM

## 2022-02-15 DIAGNOSIS — I152 Hypertension secondary to endocrine disorders: Secondary | ICD-10-CM | POA: Diagnosis not present

## 2022-02-15 DIAGNOSIS — F0390 Unspecified dementia without behavioral disturbance: Secondary | ICD-10-CM | POA: Insufficient documentation

## 2022-02-15 DIAGNOSIS — Z1231 Encounter for screening mammogram for malignant neoplasm of breast: Secondary | ICD-10-CM

## 2022-02-15 LAB — BAYER DCA HB A1C WAIVED: HB A1C (BAYER DCA - WAIVED): 7.8 % — ABNORMAL HIGH (ref 4.8–5.6)

## 2022-02-15 MED ORDER — GABAPENTIN 300 MG PO CAPS: ORAL_CAPSULE | ORAL | 3 refills | Status: DC

## 2022-02-15 NOTE — Progress Notes (Signed)
 Subjective:    Patient ID: Denise Gardner, female    DOB: 07/25/1945, 76 y.o.   MRN: [id number]  Chief Complaint  Patient presents with   Follow-up    Dizzy    Pt presents to the office today to follow up on Diabetes, memory, and neuropathy.   She was seen on 01/13/22 and we increased Aricept to 10 mg from 5 mg and Namenda to 10 mg from 5 mg. Has not been able to see a difference.   We stopped her Januvia and started her on Mounjaro 2.5 mg and we increased her gabapentin to 600 mg in AM and 1200 mg in evening from 300 mg AM and 900 mg at bed time . She reports her pain is better, but having dizziness. She reports her pain 5 out 10, this improved from a 10.  Diabetes She presents for her follow-up diabetic visit. She has type 2 diabetes mellitus. Associated symptoms include blurred vision and foot paresthesias. Diabetic complications include peripheral neuropathy. Risk factors for coronary artery disease include dyslipidemia, diabetes mellitus, hypertension, sedentary lifestyle and post-menopausal. She is following a generally healthy diet. Her overall blood glucose range is 110-130 mg/dl.  Hypertension This is a chronic problem. The current episode started more than 1 year ago. The problem has been resolved since onset. The problem is controlled. Associated symptoms include blurred vision and malaise/fatigue. Pertinent negatives include no peripheral edema or shortness of breath. Risk factors for coronary artery disease include diabetes mellitus, dyslipidemia, obesity and sedentary lifestyle. The current treatment provides moderate improvement. There is no history of heart failure.      Review of Systems  Constitutional:  Positive for malaise/fatigue.  Eyes:  Positive for blurred vision.  Respiratory:  Negative for shortness of breath.   All other systems reviewed and are negative.      Objective:   Physical Exam Vitals reviewed.  Constitutional:      General: She is not in acute  distress.    Appearance: She is well-developed.  HENT:     Head: Normocephalic and atraumatic.     Right Ear: Tympanic membrane normal.     Left Ear: Tympanic membrane normal.  Eyes:     Pupils: Pupils are equal, round, and reactive to light.  Neck:     Thyroid: No thyromegaly.  Cardiovascular:     Rate and Rhythm: Normal rate and regular rhythm.     Heart sounds: Normal heart sounds. No murmur heard. Pulmonary:     Effort: Pulmonary effort is normal. No respiratory distress.     Breath sounds: Normal breath sounds. No wheezing.  Abdominal:     General: Bowel sounds are normal. There is no distension.     Palpations: Abdomen is soft.     Tenderness: There is no abdominal tenderness.  Musculoskeletal:        General: No tenderness. Normal range of motion.     Cervical back: Normal range of motion and neck supple.  Skin:    General: Skin is warm and dry.  Neurological:     Mental Status: She is alert and oriented to person, place, and time.     Cranial Nerves: No cranial nerve deficit.     Deep Tendon Reflexes: Reflexes are normal and symmetric.  Psychiatric:        Behavior: Behavior normal.        Thought Content: Thought content normal.        Judgment: Judgment normal.         BP 134/75   Pulse 86   Temp (!) 97.5 F (36.4 C) (Temporal)   Ht 5' 5" (1.651 m)   Wt 194 lb (88 kg)   SpO2 99%   BMI 32.28 kg/m      Assessment & Plan:  [name] comes in today with chief complaint of Follow-up (Dizzy )   Diagnosis and orders addressed:  1. Type 2 diabetes mellitus with diabetic polyneuropathy, without long-term current use of insulin (HCC) - CMP14+EGFR - Bayer DCA Hb A1c Waived  2. Hypertension associated with diabetes (HCC) - CMP14+EGFR  3. Mild dementia associated with other underlying disease, with anxiety (HCC) - CMP14+EGFR  4. Diabetic polyneuropathy associated with type 2 diabetes mellitus (HCC) Will decrease gabapentin to 300 mg in AM, 300 mg in  afternoon, and 1200 mg at night to see if this helps with dizziness.  - CMP14+EGFR - gabapentin (NEURONTIN) 300 MG capsule; Take 300 mg AM, 300 mg afternoon, and 1200 mg at night  Dispense: 150 capsule; Refill: 3   5. Dizziness    Labs pending Health Maintenance reviewed Diet and exercise encouraged  Follow up plan: 1 month    , FNP    

## 2022-02-15 NOTE — Patient Instructions (Signed)
Diabetic Neuropathy Diabetic neuropathy refers to nerve damage that is caused by diabetes. Over time, people with diabetes can develop nerve damage throughout the body. There are several types of diabetic neuropathy: Peripheral neuropathy. This is the most common type of diabetic neuropathy. It damages the nerves that carry signals between the spinal cord and other parts of the body (peripheral nerves). This usually affects nerves in the feet, legs, hands, and arms. Autonomic neuropathy. This type causes damage to nerves that control involuntary functions (autonomic nerves). Involuntary functions are functions of the body that you do not control. They include heartbeat, body temperature, blood pressure, urination, digestion, sweating, sexual function, or response to changes in blood glucose. Focal neuropathy. This type of nerve damage affects one area of the body, such as an arm, a leg, or the face. The injury may involve one nerve or a small group of nerves. Focal neuropathy can be painful and unpredictable. It occurs most often in older adults with diabetes. This often develops suddenly, but usually improves over time and does not cause long-term problems. Proximal neuropathy. This type of nerve damage affects the nerves of the thighs, hips, buttocks, or legs. It causes severe pain, weakness, and muscle death (atrophy), usually in the thigh muscles. It is more common among older men and people who have type 2 diabetes. The length of recovery time may vary. What are the causes? Peripheral, autonomic, and focal neuropathies are caused by diabetes that is not well controlled with treatment. The cause of proximal neuropathy is not known, but it may be caused by inflammation related to uncontrolled blood glucose levels. What are the signs or symptoms? Peripheral neuropathy Peripheral neuropathy develops slowly over time. When the nerves of the feet and legs no longer work, you may experience: Burning,  stabbing, or aching pain in the legs or feet. Pain or cramping in the legs or feet. Loss of feeling (numbness) and inability to feel pressure or pain in the feet. This can lead to: Thick calluses or sores on areas of constant pressure. Ulcers. Reduced ability to feel temperature changes. Foot deformities. Muscle weakness. Loss of balance or coordination. Autonomic neuropathy The symptoms of autonomic neuropathy vary depending on which nerves are affected. Symptoms may include: Problems with digestion, such as: Nausea or vomiting. Poor appetite. Bloating. Diarrhea or constipation. Trouble swallowing. Losing weight without trying to. Problems with the heart, blood, and lungs, such as: Dizziness, especially when standing up. Fainting. Shortness of breath. Irregular heartbeat. Bladder problems, such as: Trouble starting or stopping urination. Leaking urine. Trouble emptying the bladder. Urinary tract infections (UTIs). Problems with other body functions, such as: Sweat. You may sweat too much or too little. Temperature. You might get hot easily. Or, you might feel cold more than usual. Sexual function. Men may not be able to get or maintain an erection. Women may have vaginal dryness and difficulty with arousal. Focal neuropathy Symptoms affect only one area of the body. Common symptoms include: Numbness. Tingling. Burning pain. Prickling feeling. Very sensitive skin. Weakness. Inability to move (paralysis). Muscle twitching. Muscles getting smaller (wasting). Poor coordination. Double or blurred vision. Proximal neuropathy Sudden, severe pain in the hip, thigh, or buttocks. Pain may spread from the back into the legs (sciatica). Pain and numbness in the arms and legs. Tingling. Loss of bladder control or bowel control. Weakness and wasting of thigh muscles. Difficulty getting up from a seated position. Abdominal swelling. Unexplained weight loss. How is this  diagnosed? Diagnosis varies depending on the type   of neuropathy your health care provider suspects. Peripheral neuropathy Your health care provider will do a neurologic exam. This exam checks your reflexes, how you move, and what you can feel. You may have other tests, such as: Blood tests. Tests of the fluid that surrounds the spinal cord (lumbar puncture). CT scan. MRI. Checking the nerves that control muscles (electromyogram, or EMG). Checking how quickly signals pass through your nerves (nerve conduction study). Checking a small piece of a nerve using a microscope (biopsy). Autonomic neuropathy You may have tests, such as: Tests to measure your blood pressure and heart rate. You may be secured to an exam table that moves you from a lying position to an upright position (table tilt test). Breathing tests to check your lungs. Tests to check how food moves through the digestive system (gastric emptying tests). Blood, sweat, or urine tests. Ultrasound of your bladder. Spinal fluid tests. Focal neuropathy This condition may be diagnosed with: A neurologic exam. CT scan. MRI. EMG. Nerve conduction study. Proximal neuropathy There is no test to diagnose this type of neuropathy. You may have tests to rule out other possible causes of this type of neuropathy. Tests may include: X-rays of your spine and lumbar region. Lumbar puncture. MRI. How is this treated? The goal of treatment is to keep nerve damage from getting worse. Treatment may include: Following your diabetes management plan. This will help keep your blood glucose level and your A1C level within your target range. This is the most important treatment. Using prescription pain medicine. Follow these instructions at home: Diabetes management Follow your diabetes management plan as told by your health care provider. Check your blood glucose levels. Keep your blood glucose in your target range. Have your A1C level checked at  least two times a year, or as often as told. Take over the counter and prescription medicines only as told by your health care provider. This includes insulin and diabetes medicine.  Lifestyle  Do not use any products that contain nicotine or tobacco, such as cigarettes, e-cigarettes, and chewing tobacco. If you need help quitting, ask your health care provider. Be physically active every day. Include strength training and balance exercises. Follow a healthy meal plan. Work with your health care provider to manage your blood pressure. General instructions Ask your health care provider if the medicine prescribed to you requires you to avoid driving or using machinery. Check your skin and feet every day for cuts, bruises, redness, blisters, or sores. Keep all follow-up visits. This is important. Contact a health care provider if: You have burning, stabbing, or aching pain in your legs or feet. You are unable to feel pressure or pain in your feet. You develop problems with digestion, such as: Nausea. Vomiting. Bloating. Constipation. Diarrhea. Abdominal pain. You have difficulty with urination, such as: Inability to control when you urinate (incontinence). Inability to completely empty the bladder (retention). You feel as if your heart is racing (palpitations). You feel dizzy, weak, or faint when you stand up. Get help right away if: You cannot urinate. You have sudden weakness or loss of coordination. You have trouble speaking. You have pain or pressure in your chest. You have an irregular heartbeat. You have sudden inability to move a part of your body. These symptoms may represent a serious problem that is an emergency. Do not wait to see if the symptoms will go away. Get medical help right away. Call your local emergency services (911 in the [place] Do not drive yourself to   the hospital. Summary Diabetic neuropathy is nerve damage that is caused by diabetes. It can cause numbness  and pain in the arms, legs, digestive tract, heart, and other body systems. This condition is treated by keeping your blood glucose level and your A1C level within your target range. This can help prevent neuropathy from getting worse. Check your skin and feet every day for cuts, bruises, redness, blisters, or sores. Do not use any products that contain nicotine or tobacco, such as cigarettes, e-cigarettes, and chewing tobacco. This information is not intended to replace advice given to you by your health care provider. Make sure you discuss any questions you have with your health care provider. Document Revised: 06/27/2019 Document Reviewed: 06/27/2019 Elsevier Patient Education  [place].

## 2022-02-16 LAB — CMP14+EGFR
ALT: 13 IU/L (ref 0–32)
AST: 14 IU/L (ref 0–40)
Albumin/Globulin Ratio: 1.6 (ref 1.2–2.2)
Albumin: 4.2 g/dL (ref 3.8–4.8)
Alkaline Phosphatase: 49 IU/L (ref 44–121)
BUN/Creatinine Ratio: 13 (ref 12–28)
BUN: 16 mg/dL (ref 8–27)
Bilirubin Total: 0.2 mg/dL (ref 0.0–1.2)
CO2: 26 mmol/L (ref 20–29)
Calcium: 10.3 mg/dL (ref 8.7–10.3)
Chloride: 100 mmol/L (ref 96–106)
Creatinine, Ser: 1.25 mg/dL — ABNORMAL HIGH (ref 0.57–1.00)
Globulin, Total: 2.6 g/dL (ref 1.5–4.5)
Glucose: 143 mg/dL — ABNORMAL HIGH (ref 70–99)
Potassium: 4.1 mmol/L (ref 3.5–5.2)
Sodium: 141 mmol/L (ref 134–144)
Total Protein: 6.8 g/dL (ref 6.0–8.5)
eGFR: 44 mL/min/{1.73_m2} — ABNORMAL LOW (ref >59)

## 2022-02-22 ENCOUNTER — Other Ambulatory Visit: Payer: Self-pay | Admitting: Family

## 2022-02-22 DIAGNOSIS — I152 Hypertension secondary to endocrine disorders: Secondary | ICD-10-CM

## 2022-02-24 ENCOUNTER — Telehealth: Payer: Self-pay | Admitting: Family

## 2022-02-24 MED ORDER — BLOOD GLUCOSE METER KIT: PACK | 0 refills | Status: DC

## 2022-02-24 NOTE — Telephone Encounter (Signed)
Sent patient aware 

## 2022-02-24 NOTE — Telephone Encounter (Signed)
Pt needs PCP to send order for her to get a new Accu Check meter to check her BS because hers is broke. Wants order sent to [facility] in [place].

## 2022-03-09 ENCOUNTER — Other Ambulatory Visit: Payer: Self-pay | Admitting: Family

## 2022-03-09 ENCOUNTER — Telehealth: Payer: Self-pay | Admitting: Family

## 2022-03-09 DIAGNOSIS — I152 Hypertension secondary to endocrine disorders: Secondary | ICD-10-CM

## 2022-03-09 DIAGNOSIS — J309 Allergic rhinitis, unspecified: Secondary | ICD-10-CM

## 2022-03-09 DIAGNOSIS — E1142 Type 2 diabetes mellitus with diabetic polyneuropathy: Secondary | ICD-10-CM

## 2022-03-09 DIAGNOSIS — E1159 Type 2 diabetes mellitus with other circulatory complications: Secondary | ICD-10-CM

## 2022-03-09 MED ORDER — FLUTICASONE PROPIONATE 50 MCG/ACT NA SUSP: NASAL | 1 refills | Status: DC

## 2022-03-09 MED ORDER — AMLODIPINE BESYLATE 10 MG PO TABS: ORAL_TABLET | ORAL | 1 refills | Status: DC

## 2022-03-09 MED ORDER — REPATHA SURECLICK 140 MG/ML ~~LOC~~ SOAJ: SUBCUTANEOUS | 2 refills | Status: DC

## 2022-03-09 MED ORDER — MONTELUKAST SODIUM 10 MG PO TABS: ORAL_TABLET | ORAL | 1 refills | Status: DC

## 2022-03-09 NOTE — Telephone Encounter (Signed)
Refill already sent in, patient aware.

## 2022-03-09 NOTE — Telephone Encounter (Signed)
  Prescription Request  03/09/2022  Is this a "Controlled Substance" medicine? no  Have you seen your PCP in the last 2 weeks? 02/15/22  If YES, route message to pool  -  If NO, patient needs to be scheduled for appointment.  What is the name of the medication or equipment? Mounjara   Have you contacted your pharmacy to request a refill? yes   Which pharmacy would you like this sent to? [facility] [address]   Patient notified that their request is being sent to the clinical staff for review and that they should receive a response within 2 business days.

## 2022-03-12 ENCOUNTER — Other Ambulatory Visit: Payer: Self-pay | Admitting: Family

## 2022-03-14 MED ORDER — MOUNJARO 2.5 MG/0.5ML ~~LOC~~ SOAJ: 2.5000 mg | SUBCUTANEOUS | 0 refills | Status: DC

## 2022-03-14 NOTE — Telephone Encounter (Signed)
TC from [employer] for Tirzepatide Denise Gardner) they rcvd on 03/09/22, their system is not letting them use that script, they need it sent again. Rx sent.

## 2022-03-14 NOTE — Addendum Note (Signed)
Addended by: Antonietta Barcelona D on: 03/14/2022 01:43 PM   Modules accepted: Orders

## 2022-03-16 ENCOUNTER — Telehealth: Payer: Self-pay | Admitting: Family

## 2022-03-16 ENCOUNTER — Other Ambulatory Visit: Payer: Self-pay | Admitting: Family

## 2022-03-16 DIAGNOSIS — E1142 Type 2 diabetes mellitus with diabetic polyneuropathy: Secondary | ICD-10-CM

## 2022-03-16 MED ORDER — METFORMIN HCL ER 500 MG PO TB24: ORAL_TABLET | ORAL | 3 refills | Status: DC

## 2022-03-16 MED ORDER — EMPAGLIFLOZIN 25 MG PO TABS: ORAL_TABLET | ORAL | 3 refills | Status: DC

## 2022-03-16 NOTE — Telephone Encounter (Signed)
  Prescription Request  03/16/2022  Is this a "Controlled Substance" medicine?   Have you seen your PCP in the last 2 weeks?   If YES, route message to pool  -  If NO, patient needs to be scheduled for appointment.  What is the name of the medication or equipment? empagliflozin (JARDIANCE) 25 MG TABS tablet   Have you contacted your pharmacy to request a refill? yes   Which pharmacy would you like this sent to?  [facility] [place], [place] - [address] AT [place]      Patient notified that their request is being sent to the clinical staff for review and that they should receive a response within 2 business days.

## 2022-03-16 NOTE — Telephone Encounter (Signed)
  Prescription Request  03/16/2022   What is the name of the medication or equipment? METFORMIN  Have you contacted your pharmacy to request a refill? YES  Which pharmacy would you like this sent to? [place], [place]

## 2022-03-16 NOTE — Telephone Encounter (Signed)
Sent!

## 2022-03-16 NOTE — Addendum Note (Signed)
Addended by: Ladean Raya on: 03/16/2022 09:55 AM   Modules accepted: Orders

## 2022-03-24 ENCOUNTER — Ambulatory Visit: Payer: Medicare Other | Admitting: Family

## 2022-03-28 ENCOUNTER — Encounter: Payer: Self-pay | Admitting: Family

## 2022-03-28 ENCOUNTER — Ambulatory Visit
Admission: RE | Admit: 2022-03-28 | Discharge: 2022-03-28 | Disposition: A | Discharge status: Home / Self Care | Payer: Medicare Other | Source: Ambulatory Visit | Attending: Family | Admitting: Family

## 2022-03-28 ENCOUNTER — Ambulatory Visit (INDEPENDENT_AMBULATORY_CARE_PROVIDER_SITE_OTHER): Payer: Medicare Other | Admitting: Family

## 2022-03-28 VITALS — BP 118/76 | HR 67 | Temp 98.2°F | Ht 65.0 in | Wt 190.6 lb

## 2022-03-28 DIAGNOSIS — F3342 Major depressive disorder, recurrent, in full remission: Secondary | ICD-10-CM

## 2022-03-28 DIAGNOSIS — N1831 Chronic kidney disease, stage 3a: Secondary | ICD-10-CM

## 2022-03-28 DIAGNOSIS — E1159 Type 2 diabetes mellitus with other circulatory complications: Secondary | ICD-10-CM | POA: Diagnosis not present

## 2022-03-28 DIAGNOSIS — G300 Alzheimer's disease with early onset: Secondary | ICD-10-CM | POA: Diagnosis not present

## 2022-03-28 DIAGNOSIS — E1169 Type 2 diabetes mellitus with other specified complication: Secondary | ICD-10-CM

## 2022-03-28 DIAGNOSIS — I152 Hypertension secondary to endocrine disorders: Secondary | ICD-10-CM

## 2022-03-28 DIAGNOSIS — I2510 Atherosclerotic heart disease of native coronary artery without angina pectoris: Secondary | ICD-10-CM

## 2022-03-28 DIAGNOSIS — E1122 Type 2 diabetes mellitus with diabetic chronic kidney disease: Secondary | ICD-10-CM

## 2022-03-28 DIAGNOSIS — Z79899 Other long term (current) drug therapy: Secondary | ICD-10-CM

## 2022-03-28 DIAGNOSIS — F419 Anxiety disorder, unspecified: Secondary | ICD-10-CM

## 2022-03-28 DIAGNOSIS — E1142 Type 2 diabetes mellitus with diabetic polyneuropathy: Secondary | ICD-10-CM

## 2022-03-28 DIAGNOSIS — N3281 Overactive bladder: Secondary | ICD-10-CM

## 2022-03-28 DIAGNOSIS — J45909 Unspecified asthma, uncomplicated: Secondary | ICD-10-CM

## 2022-03-28 DIAGNOSIS — K5902 Outlet dysfunction constipation: Secondary | ICD-10-CM | POA: Diagnosis not present

## 2022-03-28 DIAGNOSIS — E039 Hypothyroidism, unspecified: Secondary | ICD-10-CM

## 2022-03-28 DIAGNOSIS — G4700 Insomnia, unspecified: Secondary | ICD-10-CM

## 2022-03-28 DIAGNOSIS — J449 Chronic obstructive pulmonary disease, unspecified: Secondary | ICD-10-CM | POA: Diagnosis not present

## 2022-03-28 DIAGNOSIS — K219 Gastro-esophageal reflux disease without esophagitis: Secondary | ICD-10-CM

## 2022-03-28 DIAGNOSIS — L570 Actinic keratosis: Secondary | ICD-10-CM

## 2022-03-28 DIAGNOSIS — E785 Hyperlipidemia, unspecified: Secondary | ICD-10-CM

## 2022-03-28 DIAGNOSIS — F02A0 Dementia in other diseases classified elsewhere, mild, without behavioral disturbance, psychotic disturbance, mood disturbance, and anxiety: Secondary | ICD-10-CM | POA: Diagnosis not present

## 2022-03-28 DIAGNOSIS — E669 Obesity, unspecified: Secondary | ICD-10-CM

## 2022-03-28 DIAGNOSIS — D509 Iron deficiency anemia, unspecified: Secondary | ICD-10-CM

## 2022-03-28 MED ORDER — DOXEPIN HCL 10 MG PO CAPS: 10.0000 mg | ORAL_CAPSULE | Freq: Every day | ORAL | 1 refills | Status: DC

## 2022-03-28 NOTE — Patient Instructions (Signed)
Actinic Keratosis An actinic keratosis is a precancerous growth on the skin. If there is more than one, the condition is called actinic keratoses. These growths appear most often on parts of the skin that get a lot of sun exposure, including the: Scalp. Face. Ears. Lips. Upper back. Forearms. Backs of the hands. If left untreated, these growths may develop into a skin cancer called squamous cell carcinoma. It is important to have all these growths checked by a health care provider to determine the best treatment. What are the causes? Actinic keratoses are caused by getting too much ultraviolet (UV) radiation from the sun or other UV light sources. What increases the risk? You are more likely to develop this condition if you: Have light-colored skin or blue eyes. Have blond or red hair. Spend a lot of time in the sun. Do not protect your skin from the sun when outdoors. Are an older person. The risk of developing an actinic keratosis increases with age. What are the signs or symptoms? These growths feel like scaly, rough spots of skin. Symptoms of this condition include growths that may: Be as small as a pinhead or as big as a quarter. Itch, hurt, or feel sensitive. Be skin-colored, light tan, dark tan, pink, or a combination of these colors. In most cases, the growths become red. Have a small piece of pink or gray skin (skin tag) growing from them. It may be easier to notice the growths by feeling them rather than seeing them. Sometimes, actinic keratoses disappear but may return a few days to a few weeks later. How is this diagnosed? This condition is usually diagnosed with a physical exam. A tissue sample may be removed from the growth and examined under a microscope (biopsy). How is this treated? This condition may be treated by: Scraping off the actinic keratosis (curettage). Freezing the actinic keratosis with liquid nitrogen (cryosurgery). This causes the growth to eventually  fall off. Applying medicated creams or gels to destroy the cells in the growth. Applying chemicals to the growth to make the outer layers of skin peel off (chemical peel). Using photodynamic therapy. In this procedure, medicated cream is applied to the actinic keratosis. This cream increases your skin's sensitivity to light. Then, a strong light is aimed at the actinic keratosis to destroy cells in the growth. Follow these instructions at home: Skin care Apply cool, wet cloths (coolcompresses) to the affected areas. Do not scratch your skin. Check your skin regularly for any growths, especially ones that: Start to itch or bleed. Change in size, shape, or color. Caring for the treated area Keep the treated area clean and dry as told by your health care provider. Do not apply any medicine, cream, or lotion to the treated area unless your health care provider tells you to do that. Do not pick at blisters or try to break them open. This can cause infection and scarring. If you have red or irritated skin after treatment, follow instructions from your health care provider about how to take care of the treated area. Make sure you: Wash your hands with soap and water for at least 20 seconds before and after you change your bandage (dressing). If soap and water are not available, use hand sanitizer. Change your dressing as told by your health care provider. If you have red or irritated skin after treatment, check the treated area every day for signs of infection. Check for: Redness, swelling, or pain. Fluid or blood. Warmth. Pus or a bad  smell. Lifestyle Do not use any products that contain nicotine or tobacco. These products include cigarettes, chewing tobacco, and vaping devices, such as e-cigarettes. If you need help quitting, ask your health care provider. Take steps to protect your skin from the sun, such as: Avoiding the sun between 10:00 a.m. and 4:00 p.m. This is when the UV light is the  strongest. Using a sunscreen or sunblock with SPF 30 or greater. Applying sunscreen before you are exposed to sunlight and reapplying as often as told by the instructions on the sunscreen container. Wearing protective gear, including: Sunglasses with UV protection. A hat and clothing that protect your skin from sunlight. Avoiding medicines that increase your sensitivity to sunlight when possible. Avoidingtanning beds and other indoor tanning devices. General instructions Take or apply over-the-counter and prescription medicines only as told by your health care provider. Return to your normal activities as told by your health care provider. Ask your health care provider what activities are safe for you. Have a skin exam done every year by a health care provider who is a skin specialist (dermatologist). Keep all follow-up visits. Your health care provider will want to check that the site has healed after treatment. Contact a health care provider if: You notice any changes or new growths on your skin. You have swelling, pain, or redness around your treated area. You have fluid or blood coming from your treated area. Your treated area feels warm to the touch. You have pus or a bad smell coming from your treated area. You have a fever or chills. You have a blister that becomes large and painful. Summary An actinic keratosis is a precancerous growth on the skin.If left untreated, these growths can develop into skin cancer. Check your skin regularly for any growths, especially growths that start to itch or bleed, or change in size, shape, or color. Take steps to protect your skin from the sun. Contact a health care provider if you notice any changes or new growths on your skin. This information is not intended to replace advice given to you by your health care provider. Make sure you discuss any questions you have with your health care provider. Document Revised: 04/29/2021 Document Reviewed:  04/29/2021 Elsevier Patient Education  [employer].

## 2022-03-28 NOTE — Progress Notes (Signed)
Subjective:    Patient ID: Denise Gardner, female    DOB: 11-06-1945, 77 y.o.   MRN: [id number]  Chief Complaint  Patient presents with   Medical Management of Chronic Issues   Pt presents to the office today for chronic follow up. She is followed by  Cardiologists annually for CAD. She is followed by Hematologists  for iron deficiency anemia and Vit B 12 deficiency.  She has diabetic neuropathy and takes gabapentin that greatly hels  She is complaining of burning aching pain of bilateral feet that is a 7 out 10 during night.    Has COPD and has intermittent SOB. Needs a new nebulizer.    She is complaining of memory issues. She is taking  Namenda 10 mg BID and Aricept 10 mg.  Complaining of skin lesion on left outer arm that has been there for months and will not heal. States it will scab up and she picks it off.  Diabetes She presents for her follow-up diabetic visit. She has type 2 diabetes mellitus. Hypoglycemia symptoms include nervousness/anxiousness. Pertinent negatives for hypoglycemia include no sweats. Associated symptoms include blurred vision, fatigue and foot paresthesias. Symptoms are stable. Diabetic complications include nephropathy and peripheral neuropathy. Risk factors for coronary artery disease include diabetes mellitus, dyslipidemia, hypertension, sedentary lifestyle and post-menopausal. She is following a generally healthy diet. Her overall blood glucose range is >200 mg/dl. An ACE inhibitor/angiotensin II receptor blocker is being taken.  Hypertension This is a chronic problem. The current episode started more than 1 year ago. The problem has been resolved since onset. The problem is controlled. Associated symptoms include anxiety and blurred vision. Pertinent negatives include no malaise/fatigue, peripheral edema, shortness of breath or sweats. Risk factors for coronary artery disease include dyslipidemia, diabetes mellitus and sedentary lifestyle. The current treatment  provides moderate improvement. Identifiable causes of hypertension include a thyroid problem.  Gastroesophageal Reflux She complains of belching, heartburn and a hoarse voice. This is a chronic problem. The current episode started more than 1 year ago. The problem occurs occasionally. Associated symptoms include fatigue. She has tried a PPI for the symptoms.  Hyperlipidemia This is a chronic problem. The current episode started more than 1 year ago. Exacerbating diseases include obesity. Pertinent negatives include no shortness of breath. Treatments tried: Repatha. The current treatment provides moderate improvement of lipids. Risk factors for coronary artery disease include dyslipidemia, diabetes mellitus, hypertension, a sedentary lifestyle and post-menopausal.  Thyroid Problem Presents for follow-up visit. Symptoms include anxiety, constipation, dry skin, fatigue and hoarse voice. The symptoms have been stable. Her past medical history is significant for hyperlipidemia.  Urinary Frequency  This is a chronic problem. The current episode started more than 1 year ago. The problem occurs intermittently. The problem has been waxing and waning. The patient is experiencing no pain. Associated symptoms include frequency and urgency. Pertinent negatives include no hematuria or sweats. She has tried increased fluids for the symptoms. The treatment provided mild relief.  Anxiety Presents for follow-up visit. Symptoms include excessive worry, nervous/anxious behavior and restlessness. Patient reports no shortness of breath. Symptoms occur occasionally. The severity of symptoms is mild.    Constipation This is a chronic problem. The current episode started more than 1 year ago. The problem has been waxing and waning since onset. Her stool frequency is 2 to 3 times per week. Risk factors include obesity. She has tried laxatives for the symptoms. The treatment provided moderate relief.      Review of Systems  Constitutional:  Positive for fatigue. Negative for malaise/fatigue.  HENT:  Positive for hoarse voice.   Eyes:  Positive for blurred vision.  Respiratory:  Negative for shortness of breath.   Gastrointestinal:  Positive for constipation and heartburn.  Genitourinary:  Positive for frequency and urgency. Negative for hematuria.  Psychiatric/Behavioral:  The patient is nervous/anxious.   All other systems reviewed and are negative.      Objective:   Physical Exam Vitals reviewed.  Constitutional:      General: She is not in acute distress.    Appearance: She is well-developed. She is obese.  HENT:     Head: Normocephalic and atraumatic.     Right Ear: Tympanic membrane normal.     Left Ear: Tympanic membrane normal.  Eyes:     Pupils: Pupils are equal, round, and reactive to light.  Neck:     Thyroid: No thyromegaly.  Cardiovascular:     Rate and Rhythm: Normal rate and regular rhythm.     Heart sounds: Normal heart sounds. No murmur heard. Pulmonary:     Effort: Pulmonary effort is normal. No respiratory distress.     Breath sounds: Normal breath sounds. No wheezing.  Abdominal:     General: Bowel sounds are normal. There is no distension.     Palpations: Abdomen is soft.     Tenderness: There is no abdominal tenderness.  Musculoskeletal:        General: No tenderness. Normal range of motion.     Cervical back: Normal range of motion and neck supple.  Skin:    General: Skin is warm and dry.          Comments: Scaly lesion that is 1.2X1.3 cm  Neurological:     Mental Status: She is alert and oriented to person, place, and time.     Cranial Nerves: No cranial nerve deficit.     Deep Tendon Reflexes: Reflexes are normal and symmetric.  Psychiatric:        Behavior: Behavior normal.        Thought Content: Thought content normal.        Judgment: Judgment normal.    Actinic keratosis lesion on left posterior bicep, Cryotherapy used. Pt tolerated well.    BP  118/76   Pulse 67   Temp 98.2 F (36.8 C)   Ht '5\' 5"'$  (1.651 m)   Wt 190 lb 9.6 oz (86.5 kg)   SpO2 93%   BMI 31.72 kg/m      Assessment & Plan:  Denise Gardner comes in today with chief complaint of Medical Management of Chronic Issues   Diagnosis and orders addressed:  1. Anxiety  2. Uncomplicated asthma, unspecified asthma severity, unspecified whether persistent  3. Chronic obstructive pulmonary disease, unspecified COPD type ([place]) - For home use only DME Nebulizer machine  4. Stage 3a chronic kidney disease ([place])  5. Controlled substance agreement signed  6. Constipation due to outlet dysfunction  7. Actinic keratosis  8. Chronic coronary artery disease  9. Mild early onset Alzheimer's dementia, unspecified whether behavioral, psychotic, or mood disturbance or anxiety ([place])  10. Recurrent major depressive disorder, in full remission ([place])  11. Diabetic polyneuropathy associated with type 2 diabetes mellitus ([place])  12. Gastroesophageal reflux disease, unspecified whether esophagitis present  13. Hyperlipidemia associated with type 2 diabetes mellitus ([place])  14. Hypertension associated with diabetes ([place])  15. Hypothyroidism, unspecified type  16. Type 2 diabetes mellitus with diabetic polyneuropathy, without long-term current use of insulin (  [place])  17. Obesity (BMI 30-39.9)   18. OAB (overactive bladder)  19. Iron deficiency anemia, unspecified iron deficiency anemia type  20. Insomnia, unspecified type Start doxepin 10 mg at night Sleep ritual  - doxepin (SINEQUAN) 10 MG capsule; Take 1 capsule (10 mg total) by mouth at bedtime.  Dispense: 90 capsule; Refill: 1   Labs pending Health Maintenance reviewed Diet and exercise encouraged  Follow up plan: 1 month   Evelina Dun, FNP

## 2022-04-08 ENCOUNTER — Other Ambulatory Visit: Payer: Self-pay | Admitting: Family

## 2022-04-08 DIAGNOSIS — F03A0 Unspecified dementia, mild, without behavioral disturbance, psychotic disturbance, mood disturbance, and anxiety: Secondary | ICD-10-CM

## 2022-04-26 ENCOUNTER — Telehealth: Payer: Self-pay | Admitting: Family

## 2022-04-26 DIAGNOSIS — E1159 Type 2 diabetes mellitus with other circulatory complications: Secondary | ICD-10-CM

## 2022-04-26 DIAGNOSIS — E1142 Type 2 diabetes mellitus with diabetic polyneuropathy: Secondary | ICD-10-CM

## 2022-04-26 DIAGNOSIS — K219 Gastro-esophageal reflux disease without esophagitis: Secondary | ICD-10-CM

## 2022-04-26 DIAGNOSIS — M8589 Other specified disorders of bone density and structure, multiple sites: Secondary | ICD-10-CM

## 2022-04-26 DIAGNOSIS — I152 Hypertension secondary to endocrine disorders: Secondary | ICD-10-CM

## 2022-04-26 MED ORDER — MOUNJARO 2.5 MG/0.5ML ~~LOC~~ SOAJ: 2.5000 mg | SUBCUTANEOUS | 0 refills | Status: DC

## 2022-04-26 MED ORDER — RALOXIFENE HCL 60 MG PO TABS: ORAL_TABLET | ORAL | 3 refills | Status: DC

## 2022-04-26 MED ORDER — METFORMIN HCL ER 500 MG PO TB24: ORAL_TABLET | ORAL | 3 refills | Status: DC

## 2022-04-26 NOTE — Telephone Encounter (Signed)
Per patient- Ok to transfer all Rx's to [facility]. Pt switched pharmacies.

## 2022-04-26 NOTE — Telephone Encounter (Signed)
Called patient pharmacy changed meds sent in she needed

## 2022-04-28 ENCOUNTER — Ambulatory Visit (INDEPENDENT_AMBULATORY_CARE_PROVIDER_SITE_OTHER): Payer: Medicare Other | Admitting: Family

## 2022-04-28 ENCOUNTER — Encounter: Payer: Self-pay | Admitting: Family

## 2022-04-28 VITALS — BP 130/77 | HR 74 | Temp 97.3°F | Ht 65.0 in | Wt 193.0 lb

## 2022-04-28 DIAGNOSIS — G4700 Insomnia, unspecified: Secondary | ICD-10-CM | POA: Diagnosis not present

## 2022-04-28 DIAGNOSIS — F419 Anxiety disorder, unspecified: Secondary | ICD-10-CM

## 2022-04-28 DIAGNOSIS — E1159 Type 2 diabetes mellitus with other circulatory complications: Secondary | ICD-10-CM | POA: Diagnosis not present

## 2022-04-28 DIAGNOSIS — F3342 Major depressive disorder, recurrent, in full remission: Secondary | ICD-10-CM

## 2022-04-28 DIAGNOSIS — E1142 Type 2 diabetes mellitus with diabetic polyneuropathy: Secondary | ICD-10-CM | POA: Diagnosis not present

## 2022-04-28 DIAGNOSIS — I152 Hypertension secondary to endocrine disorders: Secondary | ICD-10-CM | POA: Diagnosis not present

## 2022-04-28 MED ORDER — BLOOD PRESS MONITOR/M-L CUFF MISC: 1.0000 | Freq: Two times a day (BID) | 0 refills | Status: DC

## 2022-04-28 MED ORDER — TIRZEPATIDE 5 MG/0.5ML ~~LOC~~ SOAJ: 5.0000 mg | SUBCUTANEOUS | 2 refills | Status: DC

## 2022-04-28 NOTE — Patient Instructions (Signed)

## 2022-04-28 NOTE — Progress Notes (Signed)
Subjective:    Patient ID: Denise Gardner, female    DOB: Dec 13, 1945, 77 y.o.   MRN: [id number]  Chief Complaint  Patient presents with   Follow-up   Pt presents to the office today to follow up on insomnia. She was seen on 03/28/22 and started on doxepin 10 mg. Reports this has mildly helped.  Insomnia Primary symptoms: difficulty falling asleep, frequent awakening, malaise/fatigue.   The current episode started more than one year. The onset quality is gradual. The problem occurs intermittently. Past treatments include medication. The treatment provided moderate relief. PMH includes: depression.   Hypertension This is a chronic problem. The current episode started more than 1 year ago. The problem has been resolved since onset. The problem is controlled. Associated symptoms include anxiety, blurred vision and malaise/fatigue. Pertinent negatives include no peripheral edema or shortness of breath. Risk factors for coronary artery disease include dyslipidemia, diabetes mellitus and sedentary lifestyle. The current treatment provides moderate improvement.  Diabetes She presents for her follow-up diabetic visit. She has type 2 diabetes mellitus. Hypoglycemia symptoms include nervousness/anxiousness. Associated symptoms include blurred vision, fatigue and foot paresthesias. Symptoms are stable. Diabetic complications include peripheral neuropathy. Risk factors for coronary artery disease include dyslipidemia, diabetes mellitus, hypertension, sedentary lifestyle and post-menopausal. She is following a generally healthy diet. Her overall blood glucose range is 130-140 mg/dl.  Anxiety Presents for follow-up visit. Symptoms include excessive worry, insomnia, irritability and nervous/anxious behavior. Patient reports no shortness of breath. The severity of symptoms is moderate.    Depression        This is a chronic problem.  Associated symptoms include fatigue and insomnia.  Associated symptoms include  no helplessness and no hopelessness.  Past treatments include SSRIs - Selective serotonin reuptake inhibitors.  Past medical history includes anxiety.       Review of Systems  Constitutional:  Positive for fatigue, irritability and malaise/fatigue.  Eyes:  Positive for blurred vision.  Respiratory:  Negative for shortness of breath.   Psychiatric/Behavioral:  Positive for depression. The patient is nervous/anxious and has insomnia.   All other systems reviewed and are negative.      Objective:   Physical Exam Vitals reviewed.  Constitutional:      General: She is not in acute distress.    Appearance: She is well-developed. She is obese.  HENT:     Head: Normocephalic and atraumatic.     Right Ear: Tympanic membrane normal.     Left Ear: Tympanic membrane normal.  Eyes:     Pupils: Pupils are equal, round, and reactive to light.  Neck:     Thyroid: No thyromegaly.  Cardiovascular:     Rate and Rhythm: Normal rate and regular rhythm.     Heart sounds: Murmur heard.  Pulmonary:     Effort: Pulmonary effort is normal. No respiratory distress.     Breath sounds: Normal breath sounds. No wheezing.  Abdominal:     General: Bowel sounds are normal. There is no distension.     Palpations: Abdomen is soft.     Tenderness: There is no abdominal tenderness.  Musculoskeletal:        General: No tenderness. Normal range of motion.     Cervical back: Normal range of motion and neck supple.  Skin:    General: Skin is warm and dry.  Neurological:     Mental Status: She is alert and oriented to person, place, and time.     Cranial Nerves: No cranial nerve deficit.  Deep Tendon Reflexes: Reflexes are normal and symmetric.  Psychiatric:        Behavior: Behavior normal.        Thought Content: Thought content normal.        Judgment: Judgment normal.       BP 130/77   Pulse 74   Temp (!) 97.3 F (36.3 C) (Temporal)   Ht '5\' 5"'$  (1.651 m)   Wt 193 lb (87.5 kg)   SpO2 94%    BMI 32.12 kg/m      Assessment & Plan:  Denise Gardner comes in today with chief complaint of Follow-up   Diagnosis and orders addressed:  1. Hypertension associated with diabetes ([place]) - Blood Pressure Monitoring (BLOOD PRESS MONITOR/M-L CUFF) MISC; Apply 1 Application topically 2 (two) times daily.  Dispense: 1 each; Refill: 0  2. Type 2 diabetes mellitus with diabetic polyneuropathy, without long-term current use of insulin ([facility]) Will increase Mounjaro to 5 mg from 2.5 mg  Low carb diet  - tirzepatide (MOUNJARO) 5 MG/0.5ML Pen; Inject 5 mg into the skin once a week.  Dispense: 6 mL; Refill: 2  3. Anxiety  4. Recurrent major depressive disorder, in full remission ([place])  5. Insomnia, unspecified type   Labs pending Health Maintenance reviewed Diet and exercise encouraged  Follow up plan: 1 month for chronic follow up and follow up on DM   Evelina Dun, FNP

## 2022-04-30 ENCOUNTER — Other Ambulatory Visit: Payer: Self-pay | Admitting: Family

## 2022-04-30 DIAGNOSIS — K219 Gastro-esophageal reflux disease without esophagitis: Secondary | ICD-10-CM

## 2022-04-30 DIAGNOSIS — F02A4 Dementia in other diseases classified elsewhere, mild, with anxiety: Secondary | ICD-10-CM

## 2022-05-02 ENCOUNTER — Other Ambulatory Visit: Payer: Self-pay | Admitting: Family

## 2022-05-16 ENCOUNTER — Encounter (HOSPITAL_COMMUNITY): Payer: Self-pay | Admitting: Adult Health

## 2022-05-24 ENCOUNTER — Encounter (HOSPITAL_COMMUNITY): Payer: Self-pay | Admitting: Adult Health

## 2022-05-25 ENCOUNTER — Telehealth: Payer: Self-pay | Admitting: Family

## 2022-05-25 ENCOUNTER — Other Ambulatory Visit: Payer: Self-pay | Admitting: Family Medicine

## 2022-05-25 DIAGNOSIS — E1142 Type 2 diabetes mellitus with diabetic polyneuropathy: Secondary | ICD-10-CM

## 2022-05-25 DIAGNOSIS — F03A0 Unspecified dementia, mild, without behavioral disturbance, psychotic disturbance, mood disturbance, and anxiety: Secondary | ICD-10-CM

## 2022-05-25 DIAGNOSIS — E1159 Type 2 diabetes mellitus with other circulatory complications: Secondary | ICD-10-CM

## 2022-05-25 DIAGNOSIS — G4700 Insomnia, unspecified: Secondary | ICD-10-CM

## 2022-05-25 DIAGNOSIS — I152 Hypertension secondary to endocrine disorders: Secondary | ICD-10-CM

## 2022-05-25 DIAGNOSIS — M8589 Other specified disorders of bone density and structure, multiple sites: Secondary | ICD-10-CM

## 2022-05-25 DIAGNOSIS — K219 Gastro-esophageal reflux disease without esophagitis: Secondary | ICD-10-CM

## 2022-05-25 MED ORDER — MONTELUKAST SODIUM 10 MG PO TABS: ORAL_TABLET | ORAL | 1 refills | Status: DC

## 2022-05-25 MED ORDER — RALOXIFENE HCL 60 MG PO TABS: ORAL_TABLET | ORAL | 3 refills | Status: DC

## 2022-05-25 MED ORDER — METFORMIN HCL ER 500 MG PO TB24: ORAL_TABLET | ORAL | 3 refills | Status: DC

## 2022-05-25 MED ORDER — EMPAGLIFLOZIN 25 MG PO TABS: ORAL_TABLET | ORAL | 3 refills | Status: DC

## 2022-05-25 MED ORDER — DOXEPIN HCL 10 MG PO CAPS: 10.0000 mg | ORAL_CAPSULE | Freq: Every day | ORAL | 1 refills | Status: DC

## 2022-05-25 MED ORDER — ISOSORBIDE MONONITRATE ER 120 MG PO TB24: ORAL_TABLET | ORAL | 3 refills | Status: DC

## 2022-05-25 MED ORDER — LEVOTHYROXINE SODIUM 88 MCG PO TABS: ORAL_TABLET | ORAL | 2 refills | Status: DC

## 2022-05-25 MED ORDER — MISOPROSTOL 200 MCG PO TABS: ORAL_TABLET | ORAL | 1 refills | Status: DC

## 2022-05-25 MED ORDER — DONEPEZIL HCL 10 MG PO TABS: 10.0000 mg | ORAL_TABLET | Freq: Every day | ORAL | 1 refills | Status: DC

## 2022-05-25 NOTE — Telephone Encounter (Signed)
Patient request [facility]

## 2022-05-25 NOTE — Telephone Encounter (Signed)
Pt complaining of her feet hurting they are on fire, she has short term memory issues and Marlowe Kays wants to let Alyse Low know to address at pt appt 05/31/22, she has pain in hip, arm, and legs, pain is a 10 today Pt was dizzy one day this week and she had a fall she did not hurt anything only her pride does have a walker and cane she uses Pt also told her her blood count is low, she may need assistance with meds at home did not know which one was a BP medication and she wants a continuous blood glucose device she is pricking her finger 4 times a day

## 2022-05-25 NOTE — Telephone Encounter (Signed)
Pt called requesting to speak with nurse regarding her medications. Says she is going to start getting them all delivered to her but will explain everything to nurse when she gets a call.

## 2022-05-26 ENCOUNTER — Telehealth: Payer: Self-pay | Admitting: Family

## 2022-05-26 DIAGNOSIS — E1142 Type 2 diabetes mellitus with diabetic polyneuropathy: Secondary | ICD-10-CM

## 2022-05-26 MED ORDER — FREESTYLE LIBRE 3 SENSOR MISC: 1.0000 | Freq: Four times a day (QID) | 1 refills | Status: DC | PRN

## 2022-05-26 MED ORDER — FREESTYLE LIBRE 2 READER DEVI: 1.0000 | Freq: Four times a day (QID) | 0 refills | Status: DC | PRN

## 2022-05-26 MED ORDER — FREESTYLE LIBRE 2 SENSOR MISC: 1.0000 | Freq: Four times a day (QID) | 0 refills | Status: DC | PRN

## 2022-05-26 MED ORDER — FREESTYLE LIBRE READER DEVI: 1.0000 | Freq: Four times a day (QID) | 0 refills | Status: DC | PRN

## 2022-05-26 NOTE — Telephone Encounter (Signed)
Patient aware and verbalized understanding. Patient states she is suffering at all time takes gabapentin qid

## 2022-05-26 NOTE — Telephone Encounter (Signed)
Please make patient a follow up appointment with me to discuss pain medication.

## 2022-05-26 NOTE — Telephone Encounter (Signed)
Please make appt when patient calls back

## 2022-05-26 NOTE — Telephone Encounter (Signed)
Pharmacy called to let PCP know that they received Rx's for [facility] 3 reader and [place] 3 sensors but says [facility] 3 does not have a reader. It is used by a Stage manager. So if pt has a smart phone and can do that then all they need to fill is the [place] 3 sensor Rx. But says if pt can't use a smart phone, then they need Alyse Low to send Rx's for [place] 2 reader and [place] 2 sensors.

## 2022-05-26 NOTE — Telephone Encounter (Signed)
[facility] Prescription sent to pharmacy

## 2022-05-26 NOTE — Telephone Encounter (Signed)
Does gabapentin help with the pain?  [facility] Prescription sent to pharmacy

## 2022-05-31 ENCOUNTER — Encounter: Payer: Self-pay | Admitting: Family

## 2022-05-31 ENCOUNTER — Ambulatory Visit: Payer: Medicare HMO | Admitting: Family

## 2022-05-31 ENCOUNTER — Other Ambulatory Visit: Payer: Self-pay

## 2022-05-31 VITALS — BP 124/76 | HR 80 | Temp 97.8°F | Ht 65.0 in | Wt 186.0 lb

## 2022-05-31 DIAGNOSIS — J449 Chronic obstructive pulmonary disease, unspecified: Secondary | ICD-10-CM

## 2022-05-31 DIAGNOSIS — G6289 Other specified polyneuropathies: Secondary | ICD-10-CM

## 2022-05-31 DIAGNOSIS — I2510 Atherosclerotic heart disease of native coronary artery without angina pectoris: Secondary | ICD-10-CM | POA: Diagnosis not present

## 2022-05-31 DIAGNOSIS — F02A0 Dementia in other diseases classified elsewhere, mild, without behavioral disturbance, psychotic disturbance, mood disturbance, and anxiety: Secondary | ICD-10-CM

## 2022-05-31 DIAGNOSIS — E1142 Type 2 diabetes mellitus with diabetic polyneuropathy: Secondary | ICD-10-CM

## 2022-05-31 DIAGNOSIS — K219 Gastro-esophageal reflux disease without esophagitis: Secondary | ICD-10-CM

## 2022-05-31 DIAGNOSIS — J4489 Other specified chronic obstructive pulmonary disease: Secondary | ICD-10-CM | POA: Diagnosis not present

## 2022-05-31 DIAGNOSIS — E538 Deficiency of other specified B group vitamins: Secondary | ICD-10-CM | POA: Diagnosis not present

## 2022-05-31 DIAGNOSIS — Z0000 Encounter for general adult medical examination without abnormal findings: Secondary | ICD-10-CM | POA: Diagnosis not present

## 2022-05-31 DIAGNOSIS — J45909 Unspecified asthma, uncomplicated: Secondary | ICD-10-CM

## 2022-05-31 DIAGNOSIS — F3342 Major depressive disorder, recurrent, in full remission: Secondary | ICD-10-CM | POA: Diagnosis not present

## 2022-05-31 DIAGNOSIS — D509 Iron deficiency anemia, unspecified: Secondary | ICD-10-CM | POA: Diagnosis not present

## 2022-05-31 DIAGNOSIS — E785 Hyperlipidemia, unspecified: Secondary | ICD-10-CM

## 2022-05-31 DIAGNOSIS — E039 Hypothyroidism, unspecified: Secondary | ICD-10-CM

## 2022-05-31 DIAGNOSIS — Z79899 Other long term (current) drug therapy: Secondary | ICD-10-CM

## 2022-05-31 DIAGNOSIS — E1159 Type 2 diabetes mellitus with other circulatory complications: Secondary | ICD-10-CM | POA: Diagnosis not present

## 2022-05-31 DIAGNOSIS — E1169 Type 2 diabetes mellitus with other specified complication: Secondary | ICD-10-CM | POA: Diagnosis not present

## 2022-05-31 DIAGNOSIS — N1831 Chronic kidney disease, stage 3a: Secondary | ICD-10-CM

## 2022-05-31 DIAGNOSIS — I152 Hypertension secondary to endocrine disorders: Secondary | ICD-10-CM

## 2022-05-31 DIAGNOSIS — N3281 Overactive bladder: Secondary | ICD-10-CM

## 2022-05-31 DIAGNOSIS — N6459 Other signs and symptoms in breast: Secondary | ICD-10-CM

## 2022-05-31 DIAGNOSIS — K5902 Outlet dysfunction constipation: Secondary | ICD-10-CM

## 2022-05-31 DIAGNOSIS — E669 Obesity, unspecified: Secondary | ICD-10-CM

## 2022-05-31 DIAGNOSIS — N6310 Unspecified lump in the right breast, unspecified quadrant: Secondary | ICD-10-CM

## 2022-05-31 DIAGNOSIS — F419 Anxiety disorder, unspecified: Secondary | ICD-10-CM | POA: Diagnosis not present

## 2022-05-31 DIAGNOSIS — G300 Alzheimer's disease with early onset: Secondary | ICD-10-CM

## 2022-05-31 DIAGNOSIS — Z0001 Encounter for general adult medical examination with abnormal findings: Secondary | ICD-10-CM | POA: Diagnosis not present

## 2022-05-31 DIAGNOSIS — M791 Myalgia, unspecified site: Secondary | ICD-10-CM

## 2022-05-31 LAB — BAYER DCA HB A1C WAIVED: HB A1C (BAYER DCA - WAIVED): 7.1 % — ABNORMAL HIGH (ref 4.8–5.6)

## 2022-05-31 MED ORDER — DICLOFENAC SODIUM 1 % EX GEL: 2.0000 g | Freq: Four times a day (QID) | CUTANEOUS | 2 refills | Status: AC

## 2022-05-31 NOTE — Patient Instructions (Signed)
Peripheral Neuropathy Peripheral neuropathy is a type of nerve damage. It affects nerves that carry signals between the spinal cord and the arms, legs, and the rest of the body (peripheral nerves). It does not affect nerves in the spinal cord or brain. In peripheral neuropathy, one nerve or a group of nerves may be damaged. Peripheral neuropathy is a broad category that includes many specific nerve disorders, like diabetic neuropathy, hereditary neuropathy, and carpal tunnel syndrome. What are the causes? This condition may be caused by: Certain diseases, such as: Diabetes. This is the most common cause of peripheral neuropathy. Autoimmune diseases, such as rheumatoid arthritis and systemic lupus erythematosus. Nerve diseases that are passed from parent to child (inherited). Kidney disease. Thyroid disease. Other causes may include: Nerve injury. Pressure or stress on a nerve that lasts a long time. Lack (deficiency) of B vitamins. This can result from alcoholism, poor diet, or a restricted diet. Infections. Some medicines, such as cancer medicines (chemotherapy). Poisonous (toxic) substances, such as lead and mercury. Too little blood flowing to the legs. In some cases, the cause of this condition is not known. What are the signs or symptoms? Symptoms of this condition depend on which of your nerves is damaged. Symptoms in the legs, hands, and arms can include: Loss of feeling (numbness) in the feet, hands, or both. Tingling in the feet, hands, or both. Burning pain. Very sensitive skin. Weakness. Not being able to move a part of the body (paralysis). Clumsiness or poor coordination. Muscle twitching. Loss of balance. Symptoms in other parts of the body can include: Not being able to control your bladder. Feeling dizzy. Sexual problems. How is this diagnosed? Diagnosing and finding the cause of peripheral neuropathy can be difficult. Your health care provider will take your  medical history and do a physical exam. A neurological exam will also be done. This involves checking things that are affected by your brain, spinal cord, and nerves (nervous system). For example, your health care provider will check your reflexes, how you move, and what you can feel. You may have other tests, such as: Blood tests. Electromyogram (EMG) and nerve conduction tests. These tests check nerve function and how well the nerves are controlling the muscles. Imaging tests, such as a CT scan or MRI, to rule out other causes of your symptoms. Removing a small piece of nerve to be examined in a lab (nerve biopsy). Removing and examining a small amount of the fluid that surrounds the brain and spinal cord (lumbar puncture). How is this treated? Treatment for this condition may involve: Treating the underlying cause of the neuropathy, such as diabetes, kidney disease, or vitamin deficiencies. Stopping medicines that can cause neuropathy, such as chemotherapy. Medicine to help relieve pain. Medicines may include: Prescription or over-the-counter pain medicine. Anti-seizure medicine. Antidepressants. Pain-relieving patches that are applied to painful areas of skin. Surgery to relieve pressure on a nerve or to destroy a nerve that is causing pain. Physical therapy to help improve movement and balance. Devices to help you move around (assistive devices). Follow these instructions at home: Medicines Take over-the-counter and prescription medicines only as told by your health care provider. Do not take any other medicines without first asking your health care provider. Ask your health care provider if the medicine prescribed to you requires you to avoid driving or using machinery. Lifestyle  Do not use any products that contain nicotine or tobacco. These products include cigarettes, chewing tobacco, and vaping devices, such as e-cigarettes. Smoking keeps   blood from reaching damaged nerves. If you  need help quitting, ask your health care provider. Avoid or limit alcohol. Too much alcohol can cause a vitamin B deficiency, and vitamin B is needed for healthy nerves. Eat a healthy diet. This includes: Eating foods that are high in fiber, such as beans, whole grains, and fresh fruits and vegetables. Limiting foods that are high in fat and processed sugars, such as fried or sweet foods. General instructions  If you have diabetes, work closely with your health care provider to keep your blood sugar under control. If you have numbness in your feet: Check every day for signs of injury or infection. Watch for redness, warmth, and swelling. Wear padded socks and comfortable shoes. These help protect your feet. Develop a good support system. Living with peripheral neuropathy can be stressful. Consider talking with a mental health specialist or joining a support group. Use assistive devices and attend physical therapy as told by your health care provider. This may include using a walker or a cane. Keep all follow-up visits. This is important. Where to find more information National Institute of Neurological Disorders: www.ninds.nih.gov Contact a health care provider if: You have new signs or symptoms of peripheral neuropathy. You are struggling emotionally from dealing with peripheral neuropathy. Your pain is not well controlled. Get help right away if: You have an injury or infection that is not healing normally. You develop new weakness in an arm or leg. You have fallen or do so frequently. Summary Peripheral neuropathy is when the nerves in the arms or legs are damaged, resulting in numbness, weakness, or pain. There are many causes of peripheral neuropathy, including diabetes, pinched nerves, vitamin deficiencies, autoimmune disease, and hereditary conditions. Diagnosing and finding the cause of peripheral neuropathy can be difficult. Your health care provider will take your medical  history, do a physical exam, and do tests, including blood tests and nerve function tests. Treatment involves treating the underlying cause of the neuropathy and taking medicines to help control pain. Physical therapy and assistive devices may also help. This information is not intended to replace advice given to you by your health care provider. Make sure you discuss any questions you have with your health care provider. Document Revised: 10/20/2020 Document Reviewed: 10/20/2020 Elsevier Patient Education  2023 [employer]  

## 2022-05-31 NOTE — Telephone Encounter (Signed)
Patient had office visit 05/31/22

## 2022-05-31 NOTE — Progress Notes (Signed)
Subjective:    Patient ID: Denise Gardner, female    DOB: 1946/02/27, 77 y.o.   MRN: [id number]  Chief Complaint  Patient presents with   Medical Management of Chronic Issues   Breast Problem   Pt presents to the office today for CPE and chronic follow up. She is followed by  Cardiologists annually for CAD. She is followed by Hematologists  for iron deficiency anemia and Vit B 12 deficiency.   She has diabetic neuropathy and takes gabapentin that greatly hels  She is complaining of burning aching pain of bilateral feet that is a 7 out 10 during night.    Has COPD and has intermittent SOB. Needs a new nebulizer.    She is complaining of memory issues. She is taking  Namenda 10 mg BID and Aricept 10 mg.   States she noticed a mass in right breast over a month ago. Now she can not find mass.  Hypertension This is a chronic problem. The current episode started more than 1 year ago. The problem has been resolved since onset. The problem is controlled. Associated symptoms include anxiety, blurred vision and malaise/fatigue. Pertinent negatives include no peripheral edema or shortness of breath. Risk factors for coronary artery disease include dyslipidemia, diabetes mellitus and sedentary lifestyle. The current treatment provides moderate improvement. Identifiable causes of hypertension include a thyroid problem.  Asthma She complains of cough, hoarse voice and wheezing. There is no shortness of breath. This is a chronic problem. The current episode started more than 1 year ago. The problem occurs intermittently. The problem has been waxing and waning. Associated symptoms include heartburn and malaise/fatigue. Her symptoms are aggravated by pollen. Her past medical history is significant for asthma.  Gastroesophageal Reflux She complains of belching, coughing, heartburn, a hoarse voice and wheezing. This is a chronic problem. The current episode started more than 1 year ago. The problem occurs  occasionally. Associated symptoms include fatigue. She has tried a PPI for the symptoms. The treatment provided mild relief.  Diabetes She presents for her follow-up diabetic visit. She has type 2 diabetes mellitus. Hypoglycemia symptoms include nervousness/anxiousness. Associated symptoms include blurred vision, fatigue and foot paresthesias. Symptoms are stable. Diabetic complications include peripheral neuropathy. Risk factors for coronary artery disease include dyslipidemia, diabetes mellitus, hypertension, post-menopausal and sedentary lifestyle. She is following a generally healthy diet. Her overall blood glucose range is 110-130 mg/dl.  Thyroid Problem Presents for follow-up visit. Symptoms include anxiety, constipation, fatigue and hoarse voice. The symptoms have been stable. Her past medical history is significant for hyperlipidemia.  Hyperlipidemia This is a chronic problem. The current episode started more than 1 year ago. Exacerbating diseases include obesity. Pertinent negatives include no shortness of breath. Current antihyperlipidemic treatment includes statins. The current treatment provides moderate improvement of lipids. Risk factors for coronary artery disease include diabetes mellitus, dyslipidemia, hypertension and a sedentary lifestyle.  Urinary Frequency  This is a chronic problem. The current episode started more than 1 year ago. The patient is experiencing no pain. Associated symptoms include frequency. The treatment provided moderate relief.  Anemia Presents for follow-up visit. Symptoms include malaise/fatigue.  Depression        This is a chronic problem.  The current episode started more than 1 year ago.   The onset quality is gradual.   The problem occurs intermittently.  Associated symptoms include fatigue, helplessness, restlessness and sad.  Associated symptoms include no hopelessness.  Past treatments include SSRIs - Selective serotonin reuptake inhibitors.  Past  medical  history includes thyroid problem and anxiety.   Anxiety Presents for follow-up visit. Symptoms include excessive worry, nervous/anxious behavior and restlessness. Patient reports no shortness of breath. Symptoms occur occasionally.   Her past medical history is significant for anemia and asthma.  Constipation This is a chronic problem. The current episode started more than 1 year ago. Risk factors include obesity. She has tried laxatives for the symptoms. The treatment provided moderate relief.      Review of Systems  Constitutional:  Positive for fatigue and malaise/fatigue.  HENT:  Positive for hoarse voice.   Eyes:  Positive for blurred vision.  Respiratory:  Positive for cough and wheezing. Negative for shortness of breath.   Gastrointestinal:  Positive for constipation and heartburn.  Genitourinary:  Positive for frequency.  Psychiatric/Behavioral:  Positive for depression. The patient is nervous/anxious.   All other systems reviewed and are negative.      Objective:   Physical Exam Vitals reviewed.  Constitutional:      General: She is not in acute distress.    Appearance: She is well-developed. She is obese.  HENT:     Head: Normocephalic and atraumatic.     Right Ear: Tympanic membrane normal.     Left Ear: Tympanic membrane normal.  Eyes:     Pupils: Pupils are equal, round, and reactive to light.  Neck:     Thyroid: No thyromegaly.  Cardiovascular:     Rate and Rhythm: Normal rate and regular rhythm.     Heart sounds: Normal heart sounds. No murmur heard. Pulmonary:     Effort: Pulmonary effort is normal. No respiratory distress.     Breath sounds: Normal breath sounds. No wheezing.  Abdominal:     General: Bowel sounds are normal. There is no distension.     Palpations: Abdomen is soft.     Tenderness: There is no abdominal tenderness.  Musculoskeletal:        General: No tenderness. Normal range of motion.     Cervical back: Normal range of motion and neck  supple.  Skin:    General: Skin is warm and dry.  Neurological:     Mental Status: She is alert and oriented to person, place, and time.     Cranial Nerves: No cranial nerve deficit.     Deep Tendon Reflexes: Reflexes are normal and symmetric.  Psychiatric:        Behavior: Behavior normal.        Thought Content: Thought content normal.        Judgment: Judgment normal.       BP 124/76   Pulse 80   Temp 97.8 F (36.6 C)   Ht 5\' 5"  (1.651 m)   Wt 186 lb (84.4 kg)   SpO2 95%   BMI 30.95 kg/m      Assessment & Plan:  Trudi Frome comes in today with chief complaint of Medical Management of Chronic Issues and Breast Problem   Diagnosis and orders addressed:  1. Hypertension associated with diabetes - CMP14+EGFR  2. Anxiety - CMP14+EGFR  3. Chronic coronary artery disease - CMP14+EGFR  4. Hyperlipidemia associated with type 2 diabetes mellitus - CMP14+EGFR - Lipid panel  5. Type 2 diabetes mellitus with diabetic polyneuropathy, without long-term current use of insulin - Bayer DCA Hb A1c Waived - CMP14+EGFR  6. Uncomplicated asthma, unspecified asthma severity, unspecified whether persistent - CMP14+EGFR  7. Chronic obstructive pulmonary disease, unspecified COPD type - CMP14+EGFR  8. Gastroesophageal reflux disease,  unspecified whether esophagitis present - CMP14+EGFR  9. Hypothyroidism, unspecified type - CMP14+EGFR - TSH  10. OAB (overactive bladder) - CMP14+EGFR  11. Recurrent major depressive disorder, in full remission - CMP14+EGFR  12. Constipation due to outlet dysfunction  - CMP14+EGFR  13. Obesity (BMI 30-39.9) - CMP14+EGFR  14. Iron deficiency anemia, unspecified iron deficiency anemia type - CMP14+EGFR  15. Vitamin B 12 deficiency - CMP14+EGFR  16. Myalgia  - CMP14+EGFR  17. Controlled substance agreement signed - CMP14+EGFR  18. Other polyneuropathy - CMP14+EGFR - diclofenac Sodium (VOLTAREN) 1 % GEL; Apply 2 g  topically 4 (four) times daily.  Dispense: 350 g; Refill: 2  19. Mild early onset Alzheimer's dementia, unspecified whether behavioral, psychotic, or mood disturbance or anxiety - CMP14+EGFR  20. Stage 3a chronic kidney disease - CMP14+EGFR  21. Annual physical exam - Bayer DCA Hb A1c Waived - CMP14+EGFR - Lipid panel - TSH  22. Abnormal breast finding  23. Mass of right breast, unspecified quadrant - MM Digital Diagnostic Bilat; Future - US BREAST COMPLETE UNI RIGHT INC AXILLA   Labs pending Health Maintenance reviewed Diet and exercise encouraged  Follow up plan: 2 months    Evelina Dun, FNP

## 2022-06-01 ENCOUNTER — Other Ambulatory Visit: Payer: Self-pay

## 2022-06-01 DIAGNOSIS — N6310 Unspecified lump in the right breast, unspecified quadrant: Secondary | ICD-10-CM

## 2022-06-01 DIAGNOSIS — N6313 Unspecified lump in the right breast, lower outer quadrant: Secondary | ICD-10-CM

## 2022-06-01 LAB — LIPID PANEL
Chol/HDL Ratio: 2.2 ratio (ref 0.0–4.4)
Cholesterol, Total: 110 mg/dL (ref 100–199)
HDL: 51 mg/dL (ref >39)
LDL Chol Calc (NIH): 42 mg/dL (ref 0–99)
Triglycerides: 86 mg/dL (ref 0–149)
VLDL Cholesterol Cal: 17 mg/dL (ref 5–40)

## 2022-06-01 LAB — TSH: TSH: 1.84 u[IU]/mL (ref 0.450–4.500)

## 2022-06-03 ENCOUNTER — Telehealth: Payer: Self-pay | Admitting: Family

## 2022-06-03 NOTE — Telephone Encounter (Signed)
Please call patient with lab results

## 2022-06-06 LAB — CBC WITH DIFFERENTIAL/PLATELET
Basophils Absolute: 0.1 10*3/uL (ref 0.0–0.2)
Basos: 1 %
EOS (ABSOLUTE): 0.2 10*3/uL (ref 0.0–0.4)
Eos: 1 %
Hematocrit: 45.4 % (ref 34.0–46.6)
Hemoglobin: 14.8 g/dL (ref 11.1–15.9)
Immature Grans (Abs): 0.1 10*3/uL (ref 0.0–0.1)
Immature Granulocytes: 1 %
Lymphocytes Absolute: 2.1 10*3/uL (ref 0.7–3.1)
Lymphs: 20 %
MCH: 28.9 pg (ref 26.6–33.0)
MCHC: 32.6 g/dL (ref 31.5–35.7)
MCV: 89 fL (ref 79–97)
Monocytes Absolute: 0.9 10*3/uL (ref 0.1–0.9)
Monocytes: 9 %
Neutrophils Absolute: 7.2 10*3/uL — ABNORMAL HIGH (ref 1.4–7.0)
Neutrophils: 68 %
Platelets: 298 10*3/uL (ref 150–450)
RBC: 5.12 x10E6/uL (ref 3.77–5.28)
RDW: 13.8 % (ref 11.7–15.4)
WBC: 10.6 10*3/uL (ref 3.4–10.8)

## 2022-06-06 LAB — IRON AND TIBC
Iron Saturation: 11 % — ABNORMAL LOW (ref 15–55)
Iron: 27 ug/dL (ref 27–139)
Total Iron Binding Capacity: 239 ug/dL — ABNORMAL LOW (ref 250–450)
UIBC: 212 ug/dL (ref 118–369)

## 2022-06-06 LAB — COMPREHENSIVE METABOLIC PANEL
ALT: 16 IU/L (ref 0–32)
AST: 17 IU/L (ref 0–40)
Albumin/Globulin Ratio: 1.3 (ref 1.2–2.2)
Albumin: 3.7 g/dL — ABNORMAL LOW (ref 3.8–4.8)
Alkaline Phosphatase: 52 IU/L (ref 44–121)
BUN/Creatinine Ratio: 15 (ref 12–28)
BUN: 19 mg/dL (ref 8–27)
Bilirubin Total: 0.4 mg/dL (ref 0.0–1.2)
CO2: 25 mmol/L (ref 20–29)
Calcium: 9.4 mg/dL (ref 8.7–10.3)
Chloride: 96 mmol/L (ref 96–106)
Creatinine, Ser: 1.24 mg/dL — ABNORMAL HIGH (ref 0.57–1.00)
Globulin, Total: 2.9 g/dL (ref 1.5–4.5)
Glucose: 118 mg/dL — ABNORMAL HIGH (ref 70–99)
Potassium: 3.8 mmol/L (ref 3.5–5.2)
Sodium: 139 mmol/L (ref 134–144)
Total Protein: 6.6 g/dL (ref 6.0–8.5)
eGFR: 45 mL/min/{1.73_m2} — ABNORMAL LOW (ref >59)

## 2022-06-06 LAB — VITAMIN B12: Vitamin B-12: >2000 pg/mL — ABNORMAL HIGH (ref 232–1245)

## 2022-06-06 LAB — FERRITIN: Ferritin: 209 ng/mL — ABNORMAL HIGH (ref 15–150)

## 2022-06-06 LAB — METHYLMALONIC ACID, SERUM: Methylmalonic Acid: 160 nmol/L (ref 0–378)

## 2022-06-08 ENCOUNTER — Other Ambulatory Visit (HOSPITAL_COMMUNITY): Payer: Self-pay | Admitting: Family

## 2022-06-08 DIAGNOSIS — N6310 Unspecified lump in the right breast, unspecified quadrant: Secondary | ICD-10-CM

## 2022-06-08 DIAGNOSIS — N6313 Unspecified lump in the right breast, lower outer quadrant: Secondary | ICD-10-CM

## 2022-06-13 ENCOUNTER — Telehealth: Payer: Self-pay | Admitting: *Deleted

## 2022-06-13 DIAGNOSIS — E1142 Type 2 diabetes mellitus with diabetic polyneuropathy: Secondary | ICD-10-CM

## 2022-06-13 MED ORDER — TIRZEPATIDE 5 MG/0.5ML ~~LOC~~ SOAJ
5.0000 mg | SUBCUTANEOUS | 2 refills | Status: DC
Start: 2022-06-13 — End: 2022-08-02

## 2022-06-13 NOTE — Telephone Encounter (Signed)
Pt  contacted [facility] let them know she is on Mounjaro 5 mg, they did not get Rx, it was sent to [facility] sending to them.

## 2022-06-14 ENCOUNTER — Encounter (HOSPITAL_COMMUNITY): Payer: Self-pay | Admitting: Adult Health

## 2022-06-14 ENCOUNTER — Other Ambulatory Visit: Payer: Self-pay | Admitting: Family

## 2022-06-14 DIAGNOSIS — E1142 Type 2 diabetes mellitus with diabetic polyneuropathy: Secondary | ICD-10-CM

## 2022-06-21 ENCOUNTER — Ambulatory Visit (HOSPITAL_COMMUNITY)
Admission: RE | Admit: 2022-06-21 | Discharge: 2022-06-21 | Disposition: A | Discharge status: Home / Self Care | Payer: Medicare HMO | Source: Ambulatory Visit | Attending: Family | Admitting: Family

## 2022-06-21 ENCOUNTER — Encounter (HOSPITAL_COMMUNITY): Payer: Self-pay

## 2022-06-21 DIAGNOSIS — N6310 Unspecified lump in the right breast, unspecified quadrant: Secondary | ICD-10-CM | POA: Insufficient documentation

## 2022-06-21 DIAGNOSIS — R92323 Mammographic fibroglandular density, bilateral breasts: Secondary | ICD-10-CM | POA: Diagnosis not present

## 2022-06-22 ENCOUNTER — Telehealth: Payer: Self-pay | Admitting: Family

## 2022-06-22 MED ORDER — TIRZEPATIDE 2.5 MG/0.5ML ~~LOC~~ SOAJ: 2.5000 mg | SUBCUTANEOUS | 1 refills | Status: DC
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
## 2022-06-22 NOTE — Telephone Encounter (Signed)
Patient calling because her pharmacy does not have Mounjaro, wants to know if it can be sent to [facility]. She stated that she called them and they had 2.5 mg but not the 5 mg. Please call back and advise.

## 2022-06-22 NOTE — Telephone Encounter (Signed)
New Rx for 2.5mg  sent to [facility].  Pt is going to double the injections until the  becomes available.

## 2022-06-23 ENCOUNTER — Inpatient Hospital Stay: Payer: Medicare HMO | Attending: Physician Assistant | Admitting: Physician Assistant

## 2022-06-23 VITALS — BP 114/80 | HR 69 | Temp 97.8°F | Resp 16 | Wt 188.3 lb

## 2022-06-23 DIAGNOSIS — K5900 Constipation, unspecified: Secondary | ICD-10-CM | POA: Insufficient documentation

## 2022-06-23 DIAGNOSIS — E538 Deficiency of other specified B group vitamins: Secondary | ICD-10-CM | POA: Diagnosis not present

## 2022-06-23 DIAGNOSIS — N183 Chronic kidney disease, stage 3 unspecified: Secondary | ICD-10-CM | POA: Insufficient documentation

## 2022-06-23 DIAGNOSIS — E611 Iron deficiency: Secondary | ICD-10-CM | POA: Insufficient documentation

## 2022-06-23 DIAGNOSIS — D509 Iron deficiency anemia, unspecified: Secondary | ICD-10-CM | POA: Diagnosis not present

## 2022-06-23 DIAGNOSIS — D631 Anemia in chronic kidney disease: Secondary | ICD-10-CM | POA: Insufficient documentation

## 2022-06-23 MED ORDER — GENTLE IRON 28-60-0.008-0.4 MG PO CAPS
1.0000 | ORAL_CAPSULE | Freq: Every day | ORAL | 3 refills | Status: AC
Start: 2022-06-23 — End: ?

## 2022-06-23 NOTE — Patient Instructions (Addendum)
[facility] at [facility] **VISIT SUMMARY & IMPORTANT INSTRUCTIONS **   You were seen today by Rojelio Brenner PA-C for your follow-up visit.    HISTORY OF ANEMIA Your most recent labs show normal blood levels! I sent a prescription to your pharmacy for "Gentle Iron," which also has B12, Vitamin C, and folic acid in it.  Take this once daily.  FOLLOW-UP APPOINTMENT: We will see you for office visit in 1 year.  Please get your labs done at [facility] before your visit.  **If you feel that you need to be seen sooner for any reason between now and your next appointment, please do not hesitate to call our office and schedule an appointment.  ** Thank you for trusting me with your healthcare!  I strive to provide all of my patients with quality care at each visit.  If you receive a survey for this visit, I would be so grateful to you for taking the time to provide feedback.  Thank you in advance!  ~ Kasim Mccorkle                   Dr. Doreatha Massed   &   Rojelio Brenner, PA-C   - - - - - - - - - - - - - - - - - -    Thank you for choosing [facility] at [facility] to provide your oncology and hematology care.  To afford each patient quality time with our provider, please arrive at least 15 minutes before your scheduled appointment time.   If you have a lab appointment with the Cancer Center please come in thru the Main Entrance and check in at the main information desk.  You need to re-schedule your appointment should you arrive 10 or more minutes late.  We strive to give you quality time with our providers, and arriving late affects you and other patients whose appointments are after yours.  Also, if you no show three or more times for appointments you may be dismissed from the clinic at the providers discretion.     Again, thank you for choosing [facility].  Our hope is that these requests will decrease the amount of time  that you wait before being seen by our physicians.       _____________________________________________________________  Should you have questions after your visit to [facility], please contact our office at [phone] and follow the prompts.  Our office hours are 8:00 a.m. and 4:30 p.m. Monday - Friday.  Please note that voicemails left after 4:00 p.m. may not be returned until the following business day.  We are closed weekends and major holidays.  You do have access to a nurse 24-7, just call the main number to the clinic [phone] and do not press any options, hold on the line and a nurse will answer the phone.    For prescription refill requests, have your pharmacy contact our office and allow 72 hours.

## 2022-06-27 ENCOUNTER — Other Ambulatory Visit: Payer: Self-pay | Admitting: Family

## 2022-06-27 DIAGNOSIS — F03A0 Unspecified dementia, mild, without behavioral disturbance, psychotic disturbance, mood disturbance, and anxiety: Secondary | ICD-10-CM

## 2022-06-28 ENCOUNTER — Other Ambulatory Visit: Payer: Self-pay | Admitting: Family

## 2022-07-04 ENCOUNTER — Telehealth: Payer: Self-pay | Admitting: Family

## 2022-07-04 ENCOUNTER — Ambulatory Visit (INDEPENDENT_AMBULATORY_CARE_PROVIDER_SITE_OTHER): Payer: Medicare HMO

## 2022-07-04 VITALS — Ht 65.0 in | Wt 195.0 lb

## 2022-07-04 DIAGNOSIS — K219 Gastro-esophageal reflux disease without esophagitis: Secondary | ICD-10-CM

## 2022-07-04 DIAGNOSIS — E1159 Type 2 diabetes mellitus with other circulatory complications: Secondary | ICD-10-CM

## 2022-07-04 DIAGNOSIS — Z0000 Encounter for general adult medical examination without abnormal findings: Secondary | ICD-10-CM

## 2022-07-04 DIAGNOSIS — F419 Anxiety disorder, unspecified: Secondary | ICD-10-CM

## 2022-07-04 DIAGNOSIS — E1142 Type 2 diabetes mellitus with diabetic polyneuropathy: Secondary | ICD-10-CM

## 2022-07-04 DIAGNOSIS — F03A0 Unspecified dementia, mild, without behavioral disturbance, psychotic disturbance, mood disturbance, and anxiety: Secondary | ICD-10-CM

## 2022-07-04 DIAGNOSIS — F3342 Major depressive disorder, recurrent, in full remission: Secondary | ICD-10-CM

## 2022-07-04 DIAGNOSIS — K5900 Constipation, unspecified: Secondary | ICD-10-CM

## 2022-07-04 DIAGNOSIS — M8589 Other specified disorders of bone density and structure, multiple sites: Secondary | ICD-10-CM

## 2022-07-04 MED ORDER — ISOSORBIDE MONONITRATE ER 120 MG PO TB24: ORAL_TABLET | ORAL | 3 refills | Status: DC

## 2022-07-04 MED ORDER — METFORMIN HCL ER 500 MG PO TB24
ORAL_TABLET | ORAL | 3 refills | Status: DC
Start: 2022-07-04 — End: 2022-08-02

## 2022-07-04 MED ORDER — LINACLOTIDE 72 MCG PO CAPS
ORAL_CAPSULE | ORAL | 3 refills | Status: DC
Start: 2022-07-04 — End: 2022-12-20

## 2022-07-04 MED ORDER — ESCITALOPRAM OXALATE 10 MG PO TABS
ORAL_TABLET | ORAL | 3 refills | Status: DC
Start: 2022-07-04 — End: 2022-09-06

## 2022-07-04 MED ORDER — MISOPROSTOL 200 MCG PO TABS: ORAL_TABLET | ORAL | 1 refills | Status: DC

## 2022-07-04 MED ORDER — EMPAGLIFLOZIN 25 MG PO TABS
ORAL_TABLET | ORAL | 3 refills | Status: DC
Start: 2022-07-04 — End: 2022-08-03

## 2022-07-04 MED ORDER — AMLODIPINE BESYLATE 10 MG PO TABS
ORAL_TABLET | ORAL | 1 refills | Status: DC
Start: 2022-07-04 — End: 2022-09-06

## 2022-07-04 MED ORDER — LEVOTHYROXINE SODIUM 88 MCG PO TABS: ORAL_TABLET | ORAL | 2 refills | Status: DC

## 2022-07-04 MED ORDER — MONTELUKAST SODIUM 10 MG PO TABS: ORAL_TABLET | ORAL | 1 refills | Status: DC

## 2022-07-04 MED ORDER — MEMANTINE HCL 10 MG PO TABS
ORAL_TABLET | ORAL | 0 refills | Status: DC
Start: 2022-07-04 — End: 2022-09-06

## 2022-07-04 MED ORDER — RALOXIFENE HCL 60 MG PO TABS
ORAL_TABLET | ORAL | 3 refills | Status: DC
Start: 2022-07-04 — End: 2022-08-02
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
## 2022-07-04 NOTE — Telephone Encounter (Signed)
SENT TO PHARMACY 

## 2022-07-04 NOTE — Telephone Encounter (Signed)
Pt changed pharmacies and needs all of her meds/refills sent to [facility].

## 2022-07-04 NOTE — Patient Instructions (Signed)
Denise Gardner , Thank you for taking time to come for your Medicare Wellness Visit. I appreciate your ongoing commitment to your health goals. Please review the following plan we discussed and let me know if I can assist you in the future.   These are the goals we discussed:  Goals       Develop plan of care for management of diabetes ([facility]) (pt-stated)      Care Coordination Interventions: Discussed plans with patient for ongoing care management follow up and provided patient with direct contact information for care management team Review of patient status, including review of consultants reports, relevant laboratory and other test results, and medications completed Screening for signs and symptoms of depression related to chronic disease state  Assessed social determinant of health barriers      DIET - INCREASE WATER INTAKE      Try to drink 6-8 glasses of water daily.      Exercise 3x per week (30 min per time)      Walk for 30 minutes at least 3 times week       Manage depression (pt-stated)      Care Coordination Interventions: Evaluation of current treatment plan related to depression and patient's adherence to plan as established by provider Screening for signs and symptoms of depression related to chronic disease state  Assessed social determinant of health barriers      Manage Hyperlipidemia      Timeframe:  Long-Range Goal Priority:  Medium Start Date:     06/29/20                        Expected End Date:  06/29/21                      Follow-up: 07/31/20  Patient will self administer medications as prescribed Patient will attend all scheduled provider appointments Patient will call provider office for new concerns or questions Get outside and walk daily Follow a heart-healthy, Mediterranean Diet Call RN Care Manager as needed [phone]      manage kidney disease [facility]) (pt-stated)      Care Coordination Interventions: Assessed the Patient understanding of chronic kidney  disease    Evaluation of current treatment plan related to chronic kidney disease self management and patient's adherence to plan as established by provider      Reviewed medications with patient and discussed importance of compliance    Advised patient, providing education and rationale, to monitor blood pressure daily and record, calling PCP for findings outside established parameters    Discussed plans with patient for ongoing care management follow up and provided patient with direct contact information for care management team    Screening for signs and symptoms of depression related to chronic disease state      Assessed social determinant of health barriers         Monitor and Manage My Blood Sugar-Diabetes Type 2      Timeframe:  Long-Range Goal Priority:  Medium Start Date:  05/12/20                           Expected End Date:  02/27/21                      Follow Up Date 09/23/20   Keep all medical appointments Call PCP with any blood sugar readings over 300 or with  several readings in a row over 200 Continue to avoid sugars and simple carbohydrates Take all medication as directed Take blood sugar log and meter to PCP appointment Get outside for a walk daily but avoid the heat of the day Call [facility] as needed [phone]   Why is this important?   Checking your blood sugar at home helps to keep it from getting very high or very low.  Writing the results in a diary or log helps the doctor know how to care for you.  Your blood sugar log should have the time, date and the results.  Also, write down the amount of insulin or other medicine that you take.  Other information, like what you ate, exercise done and how you were feeling, will also be helpful.     Notes:       Prevent falls      Move slowly and change position slowly to prevent falls. Use your cane or a walker at all times.       Protect My Health. Complete ADLs; Manage Depression issues      Timeframe:  Short-Term  Goal Priority:  Medium Progress:  On Track Start Date:    04/28/21                    Expected End Date:  09/20/21                            Follow Up Date    08/09/21 at 1:00 PM       Protect My Health: Complete ADLs. Manage Depression issues    Why is this important?   Screening tests can find diseases early when they are easier to treat.  Your doctor or nurse will talk with you about which tests are important for you.  Getting shots for common diseases like the flu and shingles will help prevent them.     Patient Coping Skills Has support from her brother Attends scheduled medical appointments  Patient Deficits: Memory issues Financial challenges Some walking challenges  Patient Goals: In  next 30 days, patient will: Attend scheduled medical appointments Call RNCM as needed to discuss nursing needs Communicate with LCSW as needed to discuss social work needs -  Follow Up Plan: LCSW to call client on 08/09/21 at 1:00 PM to assess client needs       T2DM (pt-stated)      Current Barriers:  Unable to maintain control of t2dm  Pharmacist Clinical Goal(s):  Over the next 90 days, patient will maintain control of t2dm as evidenced by improved glycemic control  through collaboration with PharmD and provider.    Interventions: 1:1 collaboration with Junie Spencer, FNP regarding development and update of comprehensive plan of care as evidenced by provider attestation and co-signature Inter-disciplinary care team collaboration (see longitudinal plan of care) Comprehensive medication review performed; medication list updated in electronic medical record  Diabetes: Uncontrolled--patient reports increased blood sugars after start of repatha; current treatment: jardiance 25, januvia 100, metformin 1 gBID;  A1c 6.5, hyperglycemia has resolved, patient reports blood sugars within normal range GFR 51 Current glucose readings: fasting glucose: 118-->130, post prandial glucose:  <170 Denies hypoglycemic/hyperglycemic symptoms Discussed meal planning options and Plate method for healthy eating Avoid sugary drinks and desserts Incorporate balanced protein, non starchy veggies, 1 serving of carbohydrate with each meal Increase water intake Increase physical activity as able  Current exercise: n/a Educated on diet/medications  Recommended continue medications as prescribed; will f/u in 8 weeks to review blood sugars, consider GLP1 in place of Januvia   Patient Goals/Self-Care Activities Over the next 90 days, patient will:  - take medications as prescribed check glucose daily (fasting), document, and provide at future appointments  Follow Up Plan: Telephone follow up appointment with care management team member scheduled for: 2 month         This is a list of the screening recommended for you and due dates:  Health Maintenance  Topic Date Due   COVID-19 Vaccine (2 - Janssen risk series) 07/08/2019   Eye exam for diabetics  06/09/2022   DEXA scan (bone density measurement)  07/25/2022   DTaP/Tdap/Td vaccine (2 - Td or Tdap) 09/06/2022   Flu Shot  09/29/2022   Hemoglobin A1C  11/30/2022   Yearly kidney health urinalysis for diabetes  01/14/2023   Complete foot exam   01/14/2023   Yearly kidney function blood test for diabetes  05/31/2023   Mammogram  06/21/2023   Medicare Annual Wellness Visit  07/04/2023   Pneumonia Vaccine  Completed   Hepatitis C Screening: USPSTF Recommendation to screen - Ages 18-79 yo.  Completed   Zoster (Shingles) Vaccine  Completed   HPV Vaccine  Aged Out   Colon Cancer Screening  Discontinued    Advanced directives: Advance directive discussed with you today. I have provided a copy for you to complete at home and have notarized. Once this is complete please bring a copy in to our office so we can scan it into your chart.   Conditions/risks identified: Aim for 30 minutes of exercise or brisk walking, 6-8 glasses of water, and  5 servings of fruits and vegetables each day.   Next appointment: Follow up in one year for your annual wellness visit    Preventive Care 65 Years and Older, Female Preventive care refers to lifestyle choices and visits with your health care provider that can promote health and wellness. What does preventive care include? A yearly physical exam. This is also called an annual well check. Dental exams once or twice a year. Routine eye exams. Ask your health care provider how often you should have your eyes checked. Personal lifestyle choices, including: Daily care of your teeth and gums. Regular physical activity. Eating a healthy diet. Avoiding tobacco and drug use. Limiting alcohol use. Practicing safe sex. Taking low-dose aspirin every day. Taking vitamin and mineral supplements as recommended by your health care provider. What happens during an annual well check? The services and screenings done by your health care provider during your annual well check will depend on your age, overall health, lifestyle risk factors, and family history of disease. Counseling  Your health care provider may ask you questions about your: Alcohol use. Tobacco use. Drug use. Emotional well-being. Home and relationship well-being. Sexual activity. Eating habits. History of falls. Memory and ability to understand (cognition). Work and work Astronomer. Reproductive health. Screening  You may have the following tests or measurements: Height, weight, and BMI. Blood pressure. Lipid and cholesterol levels. These may be checked every 5 years, or more frequently if you are over 17 years old. Skin check. Lung cancer screening. You may have this screening every year starting at age 74 if you have a 30-pack-year history of smoking and currently smoke or have quit within the past 15 years. Fecal occult blood test (FOBT) of the stool. You may have this test every year starting at age 15. Flexible  sigmoidoscopy or colonoscopy. You may have a sigmoidoscopy every 5 years or a colonoscopy every 10 years starting at age 36. Hepatitis C blood test. Hepatitis B blood test. Sexually transmitted disease (STD) testing. Diabetes screening. This is done by checking your blood sugar (glucose) after you have not eaten for a while (fasting). You may have this done every 1-3 years. Bone density scan. This is done to screen for osteoporosis. You may have this done starting at age 27. Mammogram. This may be done every 1-2 years. Talk to your health care provider about how often you should have regular mammograms. Talk with your health care provider about your test results, treatment options, and if necessary, the need for more tests. Vaccines  Your health care provider may recommend certain vaccines, such as: Influenza vaccine. This is recommended every year. Tetanus, diphtheria, and acellular pertussis (Tdap, Td) vaccine. You may need a Td booster every 10 years. Zoster vaccine. You may need this after age 60. Pneumococcal 13-valent conjugate (PCV13) vaccine. One dose is recommended after age 30. Pneumococcal polysaccharide (PPSV23) vaccine. One dose is recommended after age 56. Talk to your health care provider about which screenings and vaccines you need and how often you need them. This information is not intended to replace advice given to you by your health care provider. Make sure you discuss any questions you have with your health care provider. Document Released: 03/13/2015 Document Revised: 11/04/2015 Document Reviewed: 12/16/2014 Elsevier Interactive Patient Education  2017 [employer].  Fall Prevention in the Home Falls can cause injuries. They can happen to people of all ages. There are many things you can do to make your home safe and to help prevent falls. What can I do on the outside of my home? Regularly fix the edges of walkways and driveways and fix any cracks. Remove anything that  might make you trip as you walk through a door, such as a raised step or threshold. Trim any bushes or trees on the path to your home. Use bright outdoor lighting. Clear any walking paths of anything that might make someone trip, such as rocks or tools. Regularly check to see if handrails are loose or broken. Make sure that both sides of any steps have handrails. Any raised decks and porches should have guardrails on the edges. Have any leaves, snow, or ice cleared regularly. Use sand or salt on walking paths during winter. Clean up any spills in your garage right away. This includes oil or grease spills. What can I do in the bathroom? Use night lights. Install grab bars by the toilet and in the tub and shower. Do not use towel bars as grab bars. Use non-skid mats or decals in the tub or shower. If you need to sit down in the shower, use a plastic, non-slip stool. Keep the floor dry. Clean up any water that spills on the floor as soon as it happens. Remove soap buildup in the tub or shower regularly. Attach bath mats securely with double-sided non-slip rug tape. Do not have throw rugs and other things on the floor that can make you trip. What can I do in the bedroom? Use night lights. Make sure that you have a light by your bed that is easy to reach. Do not use any sheets or blankets that are too big for your bed. They should not hang down onto the floor. Have a firm chair that has side arms. You can use this for support while you get dressed. Do  not have throw rugs and other things on the floor that can make you trip. What can I do in the kitchen? Clean up any spills right away. Avoid walking on wet floors. Keep items that you use a lot in easy-to-reach places. If you need to reach something above you, use a strong step stool that has a grab bar. Keep electrical cords out of the way. Do not use floor polish or wax that makes floors slippery. If you must use wax, use non-skid floor  wax. Do not have throw rugs and other things on the floor that can make you trip. What can I do with my stairs? Do not leave any items on the stairs. Make sure that there are handrails on both sides of the stairs and use them. Fix handrails that are broken or loose. Make sure that handrails are as long as the stairways. Check any carpeting to make sure that it is firmly attached to the stairs. Fix any carpet that is loose or worn. Avoid having throw rugs at the top or bottom of the stairs. If you do have throw rugs, attach them to the floor with carpet tape. Make sure that you have a light switch at the top of the stairs and the bottom of the stairs. If you do not have them, ask someone to add them for you. What else can I do to help prevent falls? Wear shoes that: Do not have high heels. Have rubber bottoms. Are comfortable and fit you well. Are closed at the toe. Do not wear sandals. If you use a stepladder: Make sure that it is fully opened. Do not climb a closed stepladder. Make sure that both sides of the stepladder are locked into place. Ask someone to hold it for you, if possible. Clearly mark and make sure that you can see: Any grab bars or handrails. First and last steps. Where the edge of each step is. Use tools that help you move around (mobility aids) if they are needed. These include: Canes. Walkers. Scooters. Crutches. Turn on the lights when you go into a dark area. Replace any light bulbs as soon as they burn out. Set up your furniture so you have a clear path. Avoid moving your furniture around. If any of your floors are uneven, fix them. If there are any pets around you, be aware of where they are. Review your medicines with your doctor. Some medicines can make you feel dizzy. This can increase your chance of falling. Ask your doctor what other things that you can do to help prevent falls. This information is not intended to replace advice given to you by your  health care provider. Make sure you discuss any questions you have with your health care provider. Document Released: 12/11/2008 Document Revised: 07/23/2015 Document Reviewed: 03/21/2014 Elsevier Interactive Patient Education  2017 [employer].

## 2022-07-05 ENCOUNTER — Other Ambulatory Visit: Payer: Self-pay | Admitting: Family

## 2022-07-07 ENCOUNTER — Other Ambulatory Visit (HOSPITAL_COMMUNITY): Payer: Self-pay

## 2022-07-07 ENCOUNTER — Encounter (HOSPITAL_COMMUNITY): Payer: Self-pay | Admitting: Adult Health

## 2022-07-11 ENCOUNTER — Other Ambulatory Visit: Payer: Self-pay | Admitting: Family

## 2022-07-12 NOTE — Addendum Note (Signed)
Encounter addended by: Junie Spencer, FNP on: 07/12/2022 2:48 PM  Actions taken: Clinical Note Signed

## 2022-07-12 NOTE — Progress Notes (Signed)
Done.  Chayce Robbins, FNP  

## 2022-07-14 ENCOUNTER — Other Ambulatory Visit: Payer: Self-pay | Admitting: Family

## 2022-07-14 DIAGNOSIS — E1142 Type 2 diabetes mellitus with diabetic polyneuropathy: Secondary | ICD-10-CM

## 2022-07-18 ENCOUNTER — Other Ambulatory Visit: Payer: Self-pay | Admitting: Family

## 2022-07-18 DIAGNOSIS — M159 Polyosteoarthritis, unspecified: Secondary | ICD-10-CM

## 2022-07-18 DIAGNOSIS — F3342 Major depressive disorder, recurrent, in full remission: Secondary | ICD-10-CM

## 2022-07-18 DIAGNOSIS — M8589 Other specified disorders of bone density and structure, multiple sites: Secondary | ICD-10-CM

## 2022-07-18 DIAGNOSIS — F419 Anxiety disorder, unspecified: Secondary | ICD-10-CM

## 2022-07-18 DIAGNOSIS — E1142 Type 2 diabetes mellitus with diabetic polyneuropathy: Secondary | ICD-10-CM

## 2022-07-20 ENCOUNTER — Other Ambulatory Visit (HOSPITAL_COMMUNITY): Payer: Self-pay

## 2022-07-20 ENCOUNTER — Encounter (HOSPITAL_COMMUNITY): Payer: Self-pay | Admitting: Adult Health

## 2022-07-21 ENCOUNTER — Telehealth: Payer: Self-pay

## 2022-07-21 NOTE — Telephone Encounter (Signed)
Patient Advocate Encounter   Received notification from [facility] that prior authorization for Doxepin is required.   PA submitted on 07/21/2022 [facility] Status is pending

## 2022-07-22 NOTE — Telephone Encounter (Signed)
Pharmacy Patient Advocate Encounter  Prior Authorization for Doxepin has been approved by [employer] (ins).    PA # [id number] Effective dates: 02/28/2022 through 02/28/2023

## 2022-08-01 ENCOUNTER — Telehealth: Payer: Self-pay

## 2022-08-01 NOTE — Telephone Encounter (Signed)
Denise Gardner (Key: [id number]) Rx #: [id number] Repatha SureClick 140MG /ML auto-injectors Form [employer] Electronic PA Form Created 2 days ago Sent to Plan 5 minutes ago Plan Response 5 minutes ago Submit Clinical Questions less than a minute ago Determination Wait for Determination

## 2022-08-01 NOTE — Telephone Encounter (Signed)
Denise Gardner (Key: [id number]) Rx #: [id number] Repatha SureClick 140MG /ML auto-injectors Form [employer] Electronic PA Form Created 2 days ago Sent to Plan 2 hours ago Plan Response 2 hours ago Submit Clinical Questions 2 hours ago Determination Favorable 2 hours ago Message from Plan PA Case: [id number], Status: Approved, Coverage Starts on: 02/28/2022 12:00:00 AM, Coverage Ends on: 02/28/2023 12:00:00 AM. Questions? Contact [phone].[email or web address] Kitchen Authorization Expiration Date: February 28, 2023.  Pharmacy informed

## 2022-08-02 ENCOUNTER — Ambulatory Visit (INDEPENDENT_AMBULATORY_CARE_PROVIDER_SITE_OTHER): Payer: Medicare HMO | Admitting: Family

## 2022-08-02 ENCOUNTER — Telehealth: Payer: Self-pay | Admitting: Family

## 2022-08-02 ENCOUNTER — Encounter: Payer: Self-pay | Admitting: Family

## 2022-08-02 VITALS — BP 118/72 | HR 78 | Temp 97.0°F | Ht 65.0 in | Wt 185.0 lb

## 2022-08-02 DIAGNOSIS — G300 Alzheimer's disease with early onset: Secondary | ICD-10-CM | POA: Diagnosis not present

## 2022-08-02 DIAGNOSIS — J410 Simple chronic bronchitis: Secondary | ICD-10-CM

## 2022-08-02 DIAGNOSIS — K219 Gastro-esophageal reflux disease without esophagitis: Secondary | ICD-10-CM

## 2022-08-02 DIAGNOSIS — E669 Obesity, unspecified: Secondary | ICD-10-CM

## 2022-08-02 DIAGNOSIS — E1142 Type 2 diabetes mellitus with diabetic polyneuropathy: Secondary | ICD-10-CM

## 2022-08-02 DIAGNOSIS — E1169 Type 2 diabetes mellitus with other specified complication: Secondary | ICD-10-CM | POA: Diagnosis not present

## 2022-08-02 DIAGNOSIS — G4700 Insomnia, unspecified: Secondary | ICD-10-CM | POA: Diagnosis not present

## 2022-08-02 DIAGNOSIS — F3342 Major depressive disorder, recurrent, in full remission: Secondary | ICD-10-CM

## 2022-08-02 DIAGNOSIS — M8589 Other specified disorders of bone density and structure, multiple sites: Secondary | ICD-10-CM

## 2022-08-02 DIAGNOSIS — F02A0 Dementia in other diseases classified elsewhere, mild, without behavioral disturbance, psychotic disturbance, mood disturbance, and anxiety: Secondary | ICD-10-CM

## 2022-08-02 DIAGNOSIS — I152 Hypertension secondary to endocrine disorders: Secondary | ICD-10-CM

## 2022-08-02 DIAGNOSIS — E785 Hyperlipidemia, unspecified: Secondary | ICD-10-CM

## 2022-08-02 DIAGNOSIS — N1831 Chronic kidney disease, stage 3a: Secondary | ICD-10-CM

## 2022-08-02 DIAGNOSIS — E1159 Type 2 diabetes mellitus with other circulatory complications: Secondary | ICD-10-CM | POA: Diagnosis not present

## 2022-08-02 MED ORDER — TIRZEPATIDE 7.5 MG/0.5ML ~~LOC~~ SOAJ
7.5000 mg | SUBCUTANEOUS | 2 refills | Status: DC
Start: 2022-08-02 — End: 2022-08-02

## 2022-08-02 MED ORDER — LEVOTHYROXINE SODIUM 88 MCG PO TABS: ORAL_TABLET | ORAL | 2 refills | Status: DC

## 2022-08-02 MED ORDER — METFORMIN HCL ER 500 MG PO TB24
ORAL_TABLET | ORAL | 3 refills | Status: DC
Start: 2022-08-02 — End: 2023-09-04

## 2022-08-02 MED ORDER — RALOXIFENE HCL 60 MG PO TABS
ORAL_TABLET | ORAL | 3 refills | Status: DC
Start: 2022-08-02 — End: 2022-11-03

## 2022-08-02 MED ORDER — DOXEPIN HCL 10 MG PO CAPS: 10.0000 mg | ORAL_CAPSULE | Freq: Every day | ORAL | 1 refills | Status: DC

## 2022-08-02 MED ORDER — TIRZEPATIDE 7.5 MG/0.5ML ~~LOC~~ SOAJ
7.5000 mg | SUBCUTANEOUS | 2 refills | Status: DC
Start: 2022-08-02 — End: 2023-09-21

## 2022-08-02 NOTE — Progress Notes (Signed)
Subjective:    Patient ID: Denise Gardner, female    DOB: 1945/08/11, 77 y.o.   MRN: [id number]  Chief Complaint  Patient presents with   Medical Management of Chronic Issues   Pt presents to the office today for  chronic follow up. She is followed by  Cardiologists annually for CAD.   She is followed by Hematologists annually  for iron deficiency anemia and Vit B 12 deficiency.   She has diabetic neuropathy and takes gabapentin that greatly hels  She is complaining of burning aching pain of bilateral feet that is a 9 out 10 during night.    Has COPD and has intermittent SOB. Uses albuterol nebulizer BID prn.     She is complaining of memory issues. She is taking  Namenda 10 mg BID and Aricept 10 mg. Diabetes She presents for her follow-up diabetic visit. She has type 2 diabetes mellitus. Associated symptoms include blurred vision and foot paresthesias. Symptoms are stable. Diabetic complications include peripheral neuropathy. Risk factors for coronary artery disease include dyslipidemia, diabetes mellitus, hypertension and sedentary lifestyle. She is following a generally unhealthy diet. Her overall blood glucose range is >200 mg/dl. Eye exam is current.  Hypertension This is a chronic problem. The current episode started more than 1 year ago. The problem has been resolved since onset. The problem is controlled. Associated symptoms include blurred vision and malaise/fatigue. Pertinent negatives include no peripheral edema or shortness of breath. Risk factors for coronary artery disease include dyslipidemia, diabetes mellitus and sedentary lifestyle.  Hyperlipidemia This is a chronic problem. The current episode started more than 1 year ago. Exacerbating diseases include obesity. Pertinent negatives include no shortness of breath. Current antihyperlipidemic treatment includes diet change. The current treatment provides no improvement of lipids. Risk factors for coronary artery disease include  diabetes mellitus, hypertension, dyslipidemia and obesity.      Review of Systems  Constitutional:  Positive for malaise/fatigue.  Eyes:  Positive for blurred vision.  Respiratory:  Negative for shortness of breath.   All other systems reviewed and are negative.      Objective:   Physical Exam Vitals reviewed.  Constitutional:      General: She is not in acute distress.    Appearance: She is well-developed. She is obese.  HENT:     Head: Normocephalic and atraumatic.     Right Ear: Tympanic membrane normal.     Left Ear: Tympanic membrane normal.  Eyes:     Pupils: Pupils are equal, round, and reactive to light.  Neck:     Thyroid: No thyromegaly.  Cardiovascular:     Rate and Rhythm: Normal rate and regular rhythm.     Heart sounds: Normal heart sounds. No murmur heard. Pulmonary:     Effort: Pulmonary effort is normal. No respiratory distress.     Breath sounds: Normal breath sounds. No wheezing.  Abdominal:     General: Bowel sounds are normal. There is no distension.     Palpations: Abdomen is soft.     Tenderness: There is no abdominal tenderness.  Musculoskeletal:        General: No tenderness.     Cervical back: Normal range of motion and neck supple.     Comments: Pain in bilateral knees with flexion  Skin:    General: Skin is warm and dry.  Neurological:     Mental Status: She is alert and oriented to person, place, and time.     Cranial Nerves: No cranial nerve  deficit.     Deep Tendon Reflexes: Reflexes are normal and symmetric.  Psychiatric:        Behavior: Behavior normal.        Thought Content: Thought content normal.        Judgment: Judgment normal.       BP 118/72   Pulse 78   Temp (!) 97 F (36.1 C) (Temporal)   Ht 5\' 5"  (1.651 m)   Wt 185 lb (83.9 kg)   SpO2 94%   BMI 30.79 kg/m      Assessment & Plan:  Denise Gardner comes in today with chief complaint of Medical Management of Chronic Issues   Diagnosis and orders  addressed:  1. Insomnia, unspecified type - doxepin (SINEQUAN) 10 MG capsule; Take 1 capsule (10 mg total) by mouth at bedtime.  Dispense: 90 capsule; Refill: 1  2. Simple chronic bronchitis (HCC)  3. Stage 3a chronic kidney disease (HCC)  4. Recurrent major depressive disorder, in full remission (HCC)  5. Mild early onset Alzheimer's dementia, unspecified whether behavioral, psychotic, or mood disturbance or anxiety (HCC)  6. Diabetic polyneuropathy associated with type 2 diabetes mellitus (HCC)  7. Hypertension associated with diabetes (HCC)   8. Hyperlipidemia associated with type 2 diabetes mellitus (HCC)  9. Type 2 diabetes mellitus with diabetic polyneuropathy, without long-term current use of insulin (HCC) Will increase Mounjaro to 7.5 mg from 2.5 mg. Pt has not been able to get the 5 mg. She has taken Ozempic prior.  If having nausea , vomiting, or constipation will go back to 2.5 mg - tirzepatide (MOUNJARO) 7.5 MG/0.5ML Pen; Inject 7.5 mg into the skin once a week.  Dispense: 6 mL; Refill: 2  10. Obesity (BMI 30-39.9)   Labs pending Health Maintenance reviewed Diet and exercise encouraged  Follow up plan: 2 months    Jannifer Rodney, FNP

## 2022-08-02 NOTE — Telephone Encounter (Signed)
Sent some meds in THE ONES NEEDING REFILLS

## 2022-08-02 NOTE — Patient Instructions (Signed)

## 2022-08-03 ENCOUNTER — Telehealth: Payer: Self-pay | Admitting: Family

## 2022-08-03 ENCOUNTER — Other Ambulatory Visit: Payer: Self-pay | Admitting: Family

## 2022-08-03 DIAGNOSIS — F03A0 Unspecified dementia, mild, without behavioral disturbance, psychotic disturbance, mood disturbance, and anxiety: Secondary | ICD-10-CM

## 2022-08-03 DIAGNOSIS — E1142 Type 2 diabetes mellitus with diabetic polyneuropathy: Secondary | ICD-10-CM

## 2022-08-03 MED ORDER — EMPAGLIFLOZIN 25 MG PO TABS
ORAL_TABLET | ORAL | 3 refills | Status: DC
Start: 2022-08-03 — End: 2023-03-16

## 2022-08-03 NOTE — Telephone Encounter (Signed)
Pt is using [facility] but only getting a 30-d supply and usually gets a 90-d supply we sent her Jardiance to [facility] last month for #90 w/ 3 refills and she did not get it for that, she is going to stop using them and go back to [facility]. There were a couple of scripts sent to [facility] yesterday & I sent in her Jardiance today to [facility].

## 2022-08-17 ENCOUNTER — Telehealth: Payer: Self-pay | Admitting: Family

## 2022-08-17 DIAGNOSIS — K219 Gastro-esophageal reflux disease without esophagitis: Secondary | ICD-10-CM

## 2022-08-17 MED ORDER — OMEPRAZOLE 20 MG PO CPDR: DELAYED_RELEASE_CAPSULE | ORAL | 0 refills | Status: DC

## 2022-08-17 NOTE — Telephone Encounter (Signed)
  Prescription Request  08/17/2022  Is this a "Controlled Substance" medicine? no  Have you seen your PCP in the last 2 weeks? no  If YES, route message to pool  -  If NO, patient needs to be scheduled for appointment.  What is the name of the medication or equipment? Omeprazole 20 mg  Have you contacted your pharmacy to request a refill? yes   Which pharmacy would you like this sent to? [facility] in [place]. [place]   Patient notified that their request is being sent to the clinical staff for review and that they should receive a response within 2 business days.

## 2022-08-17 NOTE — Telephone Encounter (Signed)
Aware refill sent to pharmacy ?

## 2022-08-22 ENCOUNTER — Telehealth: Payer: Self-pay | Admitting: Family

## 2022-08-23 MED ORDER — CETIRIZINE HCL 5 MG PO TABS: 5.0000 mg | ORAL_TABLET | Freq: Every day | ORAL | 1 refills | Status: DC

## 2022-08-23 NOTE — Telephone Encounter (Signed)
Zyrtec 5 mg Prescription sent to pharmacy   

## 2022-08-23 NOTE — Telephone Encounter (Signed)
Patient aware and verbalizes understanding. 

## 2022-09-02 ENCOUNTER — Other Ambulatory Visit: Payer: Self-pay | Admitting: Family

## 2022-09-04 ENCOUNTER — Other Ambulatory Visit: Payer: Self-pay | Admitting: Family

## 2022-09-04 DIAGNOSIS — M159 Polyosteoarthritis, unspecified: Secondary | ICD-10-CM

## 2022-09-04 DIAGNOSIS — I152 Hypertension secondary to endocrine disorders: Secondary | ICD-10-CM

## 2022-09-06 ENCOUNTER — Telehealth: Payer: Self-pay | Admitting: Family

## 2022-09-06 DIAGNOSIS — F419 Anxiety disorder, unspecified: Secondary | ICD-10-CM

## 2022-09-06 DIAGNOSIS — F03A0 Unspecified dementia, mild, without behavioral disturbance, psychotic disturbance, mood disturbance, and anxiety: Secondary | ICD-10-CM

## 2022-09-06 DIAGNOSIS — F3342 Major depressive disorder, recurrent, in full remission: Secondary | ICD-10-CM

## 2022-09-06 DIAGNOSIS — E1159 Type 2 diabetes mellitus with other circulatory complications: Secondary | ICD-10-CM

## 2022-09-06 MED ORDER — ESCITALOPRAM OXALATE 10 MG PO TABS
ORAL_TABLET | ORAL | 3 refills | Status: DC
Start: 2022-09-06 — End: 2023-07-13

## 2022-09-06 MED ORDER — MEMANTINE HCL 10 MG PO TABS
ORAL_TABLET | ORAL | 0 refills | Status: DC
Start: 2022-09-06 — End: 2022-11-03

## 2022-09-06 MED ORDER — AMLODIPINE BESYLATE 10 MG PO TABS
ORAL_TABLET | ORAL | 1 refills | Status: DC
Start: 2022-09-06 — End: 2022-11-03

## 2022-09-06 MED ORDER — ISOSORBIDE MONONITRATE ER 120 MG PO TB24: ORAL_TABLET | ORAL | 3 refills | Status: DC

## 2022-09-06 NOTE — Telephone Encounter (Signed)
SENT TO NEW PHARMACY

## 2022-09-06 NOTE — Telephone Encounter (Signed)
Pt changed pharmacies and needs all of her Rx's sent to [facility] in [place]. She is going there tomorrow to pick up. Specifically asked for her Isosorbide Rx to be refilled.

## 2022-09-07 ENCOUNTER — Other Ambulatory Visit: Payer: Self-pay | Admitting: Family

## 2022-10-03 ENCOUNTER — Other Ambulatory Visit: Payer: Self-pay | Admitting: Family

## 2022-10-03 DIAGNOSIS — J309 Allergic rhinitis, unspecified: Secondary | ICD-10-CM

## 2022-10-12 ENCOUNTER — Other Ambulatory Visit: Payer: Self-pay | Admitting: Family

## 2022-10-12 DIAGNOSIS — F03A0 Unspecified dementia, mild, without behavioral disturbance, psychotic disturbance, mood disturbance, and anxiety: Secondary | ICD-10-CM

## 2022-10-18 ENCOUNTER — Other Ambulatory Visit: Payer: Self-pay | Admitting: Family

## 2022-10-18 DIAGNOSIS — E1142 Type 2 diabetes mellitus with diabetic polyneuropathy: Secondary | ICD-10-CM

## 2022-11-02 ENCOUNTER — Other Ambulatory Visit: Payer: Self-pay | Admitting: Family

## 2022-11-02 DIAGNOSIS — K219 Gastro-esophageal reflux disease without esophagitis: Secondary | ICD-10-CM

## 2022-11-02 DIAGNOSIS — F03A0 Unspecified dementia, mild, without behavioral disturbance, psychotic disturbance, mood disturbance, and anxiety: Secondary | ICD-10-CM

## 2022-11-03 ENCOUNTER — Ambulatory Visit (INDEPENDENT_AMBULATORY_CARE_PROVIDER_SITE_OTHER): Payer: Medicare HMO | Admitting: Family

## 2022-11-03 ENCOUNTER — Ambulatory Visit: Payer: Medicare HMO

## 2022-11-03 ENCOUNTER — Encounter: Payer: Self-pay | Admitting: Family

## 2022-11-03 ENCOUNTER — Other Ambulatory Visit: Payer: Medicare HMO

## 2022-11-03 ENCOUNTER — Other Ambulatory Visit: Payer: Self-pay | Admitting: Family

## 2022-11-03 VITALS — BP 109/66 | HR 76 | Temp 97.7°F | Ht 65.0 in | Wt 177.6 lb

## 2022-11-03 DIAGNOSIS — G4700 Insomnia, unspecified: Secondary | ICD-10-CM | POA: Diagnosis not present

## 2022-11-03 DIAGNOSIS — Z23 Encounter for immunization: Secondary | ICD-10-CM

## 2022-11-03 DIAGNOSIS — J410 Simple chronic bronchitis: Secondary | ICD-10-CM

## 2022-11-03 DIAGNOSIS — E1142 Type 2 diabetes mellitus with diabetic polyneuropathy: Secondary | ICD-10-CM | POA: Diagnosis not present

## 2022-11-03 DIAGNOSIS — E1159 Type 2 diabetes mellitus with other circulatory complications: Secondary | ICD-10-CM | POA: Diagnosis not present

## 2022-11-03 DIAGNOSIS — N1831 Chronic kidney disease, stage 3a: Secondary | ICD-10-CM

## 2022-11-03 DIAGNOSIS — K219 Gastro-esophageal reflux disease without esophagitis: Secondary | ICD-10-CM

## 2022-11-03 DIAGNOSIS — I152 Hypertension secondary to endocrine disorders: Secondary | ICD-10-CM | POA: Diagnosis not present

## 2022-11-03 DIAGNOSIS — J45909 Unspecified asthma, uncomplicated: Secondary | ICD-10-CM | POA: Diagnosis not present

## 2022-11-03 DIAGNOSIS — E1169 Type 2 diabetes mellitus with other specified complication: Secondary | ICD-10-CM | POA: Diagnosis not present

## 2022-11-03 DIAGNOSIS — M8589 Other specified disorders of bone density and structure, multiple sites: Secondary | ICD-10-CM

## 2022-11-03 DIAGNOSIS — F419 Anxiety disorder, unspecified: Secondary | ICD-10-CM | POA: Diagnosis not present

## 2022-11-03 DIAGNOSIS — F03A0 Unspecified dementia, mild, without behavioral disturbance, psychotic disturbance, mood disturbance, and anxiety: Secondary | ICD-10-CM

## 2022-11-03 LAB — BAYER DCA HB A1C WAIVED: HB A1C (BAYER DCA - WAIVED): 6.3 % — ABNORMAL HIGH (ref 4.8–5.6)

## 2022-11-03 MED ORDER — AMLODIPINE BESYLATE 10 MG PO TABS
ORAL_TABLET | ORAL | 1 refills | Status: DC
Start: 2022-11-03 — End: 2023-07-13

## 2022-11-03 MED ORDER — CETIRIZINE HCL 10 MG PO TABS: 10.0000 mg | ORAL_TABLET | Freq: Every day | ORAL | 1 refills | Status: DC

## 2022-11-03 MED ORDER — RALOXIFENE HCL 60 MG PO TABS
ORAL_TABLET | ORAL | 3 refills | Status: DC
Start: 2022-11-03 — End: 2023-11-27

## 2022-11-03 MED ORDER — DOXEPIN HCL 10 MG PO CAPS
10.0000 mg | ORAL_CAPSULE | Freq: Every day | ORAL | 1 refills | Status: DC
Start: 2022-11-03 — End: 2022-11-03

## 2022-11-03 MED ORDER — GABAPENTIN 300 MG PO CAPS
ORAL_CAPSULE | ORAL | 0 refills | Status: DC
Start: 2022-11-03 — End: 2022-12-20

## 2022-11-03 MED ORDER — TRAZODONE HCL 100 MG PO TABS
100.0000 mg | ORAL_TABLET | Freq: Every day | ORAL | 1 refills | Status: DC
Start: 2022-11-03 — End: 2023-04-14

## 2022-11-03 MED ORDER — MISOPROSTOL 200 MCG PO TABS: ORAL_TABLET | ORAL | 0 refills | Status: DC

## 2022-11-03 MED ORDER — METOPROLOL TARTRATE 25 MG PO TABS
ORAL_TABLET | ORAL | 0 refills | Status: DC
Start: 2022-11-03 — End: 2023-02-14

## 2022-11-03 MED ORDER — DONEPEZIL HCL 10 MG PO TABS
10.0000 mg | ORAL_TABLET | Freq: Every day | ORAL | 2 refills | Status: DC
Start: 2022-11-03 — End: 2023-06-30

## 2022-11-03 MED ORDER — MEMANTINE HCL 10 MG PO TABS
ORAL_TABLET | ORAL | 0 refills | Status: DC
Start: 2022-11-03 — End: 2023-05-05

## 2022-11-03 NOTE — Patient Instructions (Signed)
Insomnia Insomnia is a sleep disorder that makes it difficult to fall asleep or stay asleep. Insomnia can cause fatigue, low energy, difficulty concentrating, mood swings, and poor performance at work or school. There are three different ways to classify insomnia: Difficulty falling asleep. Difficulty staying asleep. Waking up too early in the morning. Any type of insomnia can be long-term (chronic) or short-term (acute). Both are common. Short-term insomnia usually lasts for 3 months or less. Chronic insomnia occurs at least three times a week for longer than 3 months. What are the causes? Insomnia may be caused by another condition, situation, or substance, such as: Having certain mental health conditions, such as anxiety and depression. Using caffeine, alcohol, tobacco, or drugs. Having gastrointestinal conditions, such as gastroesophageal reflux disease (GERD). Having certain medical conditions. These include: Asthma. Alzheimer's disease. Stroke. Chronic pain. An overactive thyroid gland (hyperthyroidism). Other sleep disorders, such as restless legs syndrome and sleep apnea. Menopause. Sometimes, the cause of insomnia may not be known. What increases the risk? Risk factors for insomnia include: Gender. Females are affected more often than males. Age. Insomnia is more common as people get older. Stress and certain medical and mental health conditions. Lack of exercise. Having an irregular work schedule. This may include working night shifts and traveling between different time zones. What are the signs or symptoms? If you have insomnia, the main symptom is having trouble falling asleep or having trouble staying asleep. This may lead to other symptoms, such as: Feeling tired or having low energy. Feeling nervous about going to sleep. Not feeling rested in the morning. Having trouble concentrating. Feeling irritable, anxious, or depressed. How is this diagnosed? This condition  may be diagnosed based on: Your symptoms and medical history. Your health care provider may ask about: Your sleep habits. Any medical conditions you have. Your mental health. A physical exam. How is this treated? Treatment for insomnia depends on the cause. Treatment may focus on treating an underlying condition that is causing the insomnia. Treatment may also include: Medicines to help you sleep. Counseling or therapy. Lifestyle adjustments to help you sleep better. Follow these instructions at home: Eating and drinking  Limit or avoid alcohol, caffeinated beverages, and products that contain nicotine and tobacco, especially close to bedtime. These can disrupt your sleep. Do not eat a large meal or eat spicy foods right before bedtime. This can lead to digestive discomfort that can make it hard for you to sleep. Sleep habits  Keep a sleep diary to help you and your health care provider figure out what could be causing your insomnia. Write down: When you sleep. When you wake up during the night. How well you sleep and how rested you feel the next day. Any side effects of medicines you are taking. What you eat and drink. Make your bedroom a dark, comfortable place where it is easy to fall asleep. Put up shades or blackout curtains to block light from outside. Use a white noise machine to block noise. Keep the temperature cool. Limit screen use before bedtime. This includes: Not watching TV. Not using your smartphone, tablet, or computer. Stick to a routine that includes going to bed and waking up at the same times every day and night. This can help you fall asleep faster. Consider making a quiet activity, such as reading, part of your nighttime routine. Try to avoid taking naps during the day so that you sleep better at night. Get out of bed if you are still awake after   15 minutes of trying to sleep. Keep the lights down, but try reading or doing a quiet activity. When you feel  sleepy, go back to bed. General instructions Take over-the-counter and prescription medicines only as told by your health care provider. Exercise regularly as told by your health care provider. However, avoid exercising in the hours right before bedtime. Use relaxation techniques to manage stress. Ask your health care provider to suggest some techniques that may work well for you. These may include: Breathing exercises. Routines to release muscle tension. Visualizing peaceful scenes. Make sure that you drive carefully. Do not drive if you feel very sleepy. Keep all follow-up visits. This is important. Contact a health care provider if: You are tired throughout the day. You have trouble in your daily routine due to sleepiness. You continue to have sleep problems, or your sleep problems get worse. Get help right away if: You have thoughts about hurting yourself or someone else. Get help right away if you feel like you may hurt yourself or others, or have thoughts about taking your own life. Go to your nearest emergency room or: Call 911. Call the National Suicide Prevention Lifeline at 1-800-273-8255 or 988. This is open 24 hours a day. Text the Crisis Text Line at 741741. Summary Insomnia is a sleep disorder that makes it difficult to fall asleep or stay asleep. Insomnia can be long-term (chronic) or short-term (acute). Treatment for insomnia depends on the cause. Treatment may focus on treating an underlying condition that is causing the insomnia. Keep a sleep diary to help you and your health care provider figure out what could be causing your insomnia. This information is not intended to replace advice given to you by your health care provider. Make sure you discuss any questions you have with your health care provider. Document Revised: 01/25/2021 Document Reviewed: 01/25/2021 Elsevier Patient Education  2024 [employer]  

## 2022-11-03 NOTE — Progress Notes (Signed)
Subjective:    Patient ID: Denise Gardner, female    DOB: [birth year], 77 y.o.   MRN: [id number]  Chief Complaint  Patient presents with   Medical Management of Chronic Issues   Pt presents to the office today for  chronic follow up. She is followed by  Cardiologists annually for CAD.    She is followed by Hematologists annually for iron deficiency anemia and Vit B 12 deficiency.   She has diabetic neuropathy and takes gabapentin that greatly hels  She is complaining of burning aching pain of bilateral feet that is a 8 out 10 during night.    Has COPD and has intermittent SOB. Uses albuterol nebulizer BID prn.     She is complaining of memory issues. She is taking  Namenda 10 mg BID and Aricept 10 mg.   Diabetes She presents for her follow-up diabetic visit. She has type 2 diabetes mellitus. Hypoglycemia symptoms include nervousness/anxiousness. Associated symptoms include blurred vision, fatigue and foot paresthesias. Symptoms are stable. Diabetic complications include peripheral neuropathy. Risk factors for coronary artery disease include diabetes mellitus, dyslipidemia, hypertension, sedentary lifestyle and post-menopausal. She is following a generally healthy diet. Her overall blood glucose range is 140-180 mg/dl.  Hypertension This is a chronic problem. The current episode started more than 1 year ago. The problem has been resolved since onset. The problem is controlled. Associated symptoms include anxiety, blurred vision, malaise/fatigue, peripheral edema and shortness of breath. Risk factors for coronary artery disease include dyslipidemia, diabetes mellitus, obesity and sedentary lifestyle. The current treatment provides moderate improvement. Identifiable causes of hypertension include a thyroid problem.  Gastroesophageal Reflux She complains of belching, coughing, heartburn and a hoarse voice. She reports no wheezing. This is a chronic problem. The current episode started more than 1  year ago. The problem occurs occasionally. Associated symptoms include fatigue. Risk factors include obesity. She has tried a PPI for the symptoms. The treatment provided moderate relief.  Asthma She complains of cough, hoarse voice and shortness of breath. There is no wheezing. This is a chronic problem. The current episode started more than 1 year ago. The problem occurs intermittently. Associated symptoms include heartburn and malaise/fatigue. She reports moderate improvement on treatment. Her past medical history is significant for asthma.  Thyroid Problem Presents for follow-up visit. Symptoms include anxiety, dry skin, fatigue and hoarse voice. Patient reports no constipation. The symptoms have been stable. Her past medical history is significant for hyperlipidemia.  Hyperlipidemia This is a chronic problem. The current episode started more than 1 year ago. The problem is controlled. Recent lipid tests were reviewed and are normal. Exacerbating diseases include obesity. Associated symptoms include shortness of breath. Current antihyperlipidemic treatment includes diet change. The current treatment provides no improvement of lipids. Risk factors for coronary artery disease include dyslipidemia, diabetes mellitus, hypertension, a sedentary lifestyle and post-menopausal.  Urinary Frequency  This is a chronic problem. The current episode started more than 1 year ago. Associated symptoms include frequency and urgency.  Anemia Presents for follow-up visit. Symptoms include malaise/fatigue.  Anxiety Presents for follow-up visit. Symptoms include excessive worry, nervous/anxious behavior and shortness of breath. Symptoms occur occasionally. The severity of symptoms is moderate.   Her past medical history is significant for anemia and asthma.      Review of Systems  Constitutional:  Positive for fatigue and malaise/fatigue.  HENT:  Positive for hoarse voice.   Eyes:  Positive for blurred vision.   Respiratory:  Positive for cough and shortness of  breath. Negative for wheezing.   Gastrointestinal:  Positive for heartburn. Negative for constipation.  Genitourinary:  Positive for frequency and urgency.  Psychiatric/Behavioral:  The patient is nervous/anxious.   All other systems reviewed and are negative.      Objective:   Physical Exam Vitals reviewed.  Constitutional:      General: She is not in acute distress.    Appearance: She is well-developed. She is obese.  HENT:     Head: Normocephalic and atraumatic.     Right Ear: Tympanic membrane normal.     Left Ear: Tympanic membrane normal.  Eyes:     Pupils: Pupils are equal, round, and reactive to light.  Neck:     Thyroid: No thyromegaly.  Cardiovascular:     Rate and Rhythm: Normal rate and regular rhythm.     Heart sounds: Normal heart sounds. No murmur heard. Pulmonary:     Effort: Pulmonary effort is normal. No respiratory distress.     Breath sounds: Normal breath sounds. No wheezing.  Abdominal:     General: Bowel sounds are normal. There is no distension.     Palpations: Abdomen is soft.     Tenderness: There is no abdominal tenderness.  Musculoskeletal:        General: No tenderness. Normal range of motion.     Cervical back: Normal range of motion and neck supple.  Skin:    General: Skin is warm and dry.  Neurological:     Mental Status: She is alert and oriented to person, place, and time.     Cranial Nerves: No cranial nerve deficit.     Deep Tendon Reflexes: Reflexes are normal and symmetric.  Psychiatric:        Behavior: Behavior normal.        Thought Content: Thought content normal.        Judgment: Judgment normal.        BP 109/66   Pulse 76   Temp 97.7 F (36.5 C) (Temporal)   Ht 5\' 5"  (1.651 m)   Wt 177 lb 9.6 oz (80.6 kg)   SpO2 96%   BMI 29.55 kg/m   Assessment & Plan:  Denise Gardner comes in today with chief complaint of Medical Management of Chronic Issues   Diagnosis and  orders addressed:  1. Hypertension associated with diabetes (HCC) - amLODipine (NORVASC) 10 MG tablet; TAKE 1 TABLET(10 MG) BY MOUTH DAILY  Dispense: 90 tablet; Refill: 1 - metoprolol tartrate (LOPRESSOR) 25 MG tablet; TAKE 1 TABLET(25 MG) BY MOUTH TWICE DAILY  Dispense: 180 tablet; Refill: 0 - CMP14+EGFR - CBC with Differential/Platelet  2. Mild dementia without behavioral disturbance, psychotic disturbance, mood disturbance, or anxiety, unspecified dementia type (HCC) - donepezil (ARICEPT) 10 MG tablet; Take 1 tablet (10 mg total) by mouth at bedtime.  Dispense: 90 tablet; Refill: 2 - memantine (NAMENDA) 10 MG tablet; TAKE 1 TABLET(10 MG) BY MOUTH TWICE DAILY  Dispense: 180 tablet; Refill: 0 - CMP14+EGFR - CBC with Differential/Platelet  3. Insomnia, unspecified type Stop doxepin and start Trazodone  Sleep ritual  - CMP14+EGFR - CBC with Differential/Platelet - traZODone (DESYREL) 100 MG tablet; Take 1 tablet (100 mg total) by mouth at bedtime.  Dispense: 90 tablet; Refill: 1  4. Diabetic polyneuropathy associated with type 2 diabetes mellitus (HCC) - gabapentin (NEURONTIN) 300 MG capsule; TAKE 1 CAPSULE BY MOUTH EVERY MORNING, 1 IN THE AFTERNOON, AND 4 CAPSULES BY MOUTH EVERY NIGHT AT BEDTIME  Dispense: 180 capsule; Refill: 0 -  CMP14+EGFR - CBC with Differential/Platelet  5. Osteopenia of multiple sites - raloxifene (EVISTA) 60 MG tablet; TAKE 1 TABLET(60 MG) BY MOUTH DAILY  Dispense: 90 tablet; Refill: 3 - CMP14+EGFR - CBC with Differential/Platelet  6. Type 2 diabetes mellitus with diabetic polyneuropathy, without long-term current use of insulin (HCC) - Bayer DCA Hb A1c Waived - CMP14+EGFR - CBC with Differential/Platelet  7. Anxiety - CMP14+EGFR - CBC with Differential/Platelet  8. Uncomplicated asthma, unspecified asthma severity, unspecified whether persistent - CMP14+EGFR - CBC with Differential/Platelet  9. Simple chronic bronchitis (HCC) - CMP14+EGFR - CBC  with Differential/Platelet  10. Stage 3a chronic kidney disease (HCC) - CMP14+EGFR - CBC with Differential/Platelet  11. Hyperlipidemia associated with type 2 diabetes mellitus (HCC) - CMP14+EGFR - CBC with Differential/Platelet  12. Gastroesophageal reflux disease, unspecified whether esophagitis present - CMP14+EGFR - CBC with Differential/Platelet   Labs pending Will increase Zyrtec to 10 mg from 5 mg Stop Doxepin and start Trazodone  Health Maintenance reviewed Diet and exercise encouraged  Follow up plan: 3 months   Jannifer Rodney, FNP

## 2022-11-04 LAB — CBC WITH DIFFERENTIAL/PLATELET
Basophils Absolute: 0.1 10*3/uL (ref 0.0–0.2)
Basos: 1 %
EOS (ABSOLUTE): 0.3 10*3/uL (ref 0.0–0.4)
Eos: 3 %
Hematocrit: 47.9 % — ABNORMAL HIGH (ref 34.0–46.6)
Hemoglobin: 15.3 g/dL (ref 11.1–15.9)
Immature Grans (Abs): 0 10*3/uL (ref 0.0–0.1)
Immature Granulocytes: 0 %
Lymphocytes Absolute: 2.9 10*3/uL (ref 0.7–3.1)
Lymphs: 35 %
MCH: 29.8 pg (ref 26.6–33.0)
MCHC: 31.9 g/dL (ref 31.5–35.7)
MCV: 93 fL (ref 79–97)
Monocytes Absolute: 0.6 10*3/uL (ref 0.1–0.9)
Monocytes: 7 %
Neutrophils Absolute: 4.4 10*3/uL (ref 1.4–7.0)
Neutrophils: 54 %
Platelets: 229 10*3/uL (ref 150–450)
RBC: 5.13 x10E6/uL (ref 3.77–5.28)
RDW: 13.3 % (ref 11.7–15.4)
WBC: 8.4 10*3/uL (ref 3.4–10.8)

## 2022-11-04 LAB — CMP14+EGFR
ALT: 15 IU/L (ref 0–32)
AST: 19 IU/L (ref 0–40)
Albumin: 4 g/dL (ref 3.8–4.8)
Alkaline Phosphatase: 42 IU/L — ABNORMAL LOW (ref 44–121)
BUN/Creatinine Ratio: 16 (ref 12–28)
BUN: 18 mg/dL (ref 8–27)
Bilirubin Total: 0.3 mg/dL (ref 0.0–1.2)
CO2: 25 mmol/L (ref 20–29)
Calcium: 9.7 mg/dL (ref 8.7–10.3)
Chloride: 104 mmol/L (ref 96–106)
Creatinine, Ser: 1.15 mg/dL — ABNORMAL HIGH (ref 0.57–1.00)
Globulin, Total: 2.6 g/dL (ref 1.5–4.5)
Glucose: 106 mg/dL — ABNORMAL HIGH (ref 70–99)
Potassium: 4.2 mmol/L (ref 3.5–5.2)
Sodium: 143 mmol/L (ref 134–144)
Total Protein: 6.6 g/dL (ref 6.0–8.5)
eGFR: 48 mL/min/{1.73_m2} — ABNORMAL LOW (ref >59)

## 2022-12-20 ENCOUNTER — Other Ambulatory Visit: Payer: Self-pay | Admitting: Family

## 2022-12-20 DIAGNOSIS — J4520 Mild intermittent asthma, uncomplicated: Secondary | ICD-10-CM

## 2022-12-20 DIAGNOSIS — K5900 Constipation, unspecified: Secondary | ICD-10-CM

## 2022-12-20 DIAGNOSIS — E1142 Type 2 diabetes mellitus with diabetic polyneuropathy: Secondary | ICD-10-CM

## 2023-01-01 ENCOUNTER — Other Ambulatory Visit: Payer: Self-pay | Admitting: Family

## 2023-01-03 ENCOUNTER — Other Ambulatory Visit: Payer: Self-pay | Admitting: Family

## 2023-01-04 ENCOUNTER — Other Ambulatory Visit: Payer: Self-pay | Admitting: Family

## 2023-02-02 ENCOUNTER — Other Ambulatory Visit: Payer: Self-pay | Admitting: Family

## 2023-02-02 DIAGNOSIS — K219 Gastro-esophageal reflux disease without esophagitis: Secondary | ICD-10-CM

## 2023-02-09 ENCOUNTER — Ambulatory Visit: Payer: Medicare HMO | Admitting: Family

## 2023-02-13 ENCOUNTER — Other Ambulatory Visit: Payer: Self-pay | Admitting: Family

## 2023-02-13 DIAGNOSIS — I152 Hypertension secondary to endocrine disorders: Secondary | ICD-10-CM

## 2023-02-27 ENCOUNTER — Encounter: Payer: Self-pay | Admitting: Family

## 2023-02-27 ENCOUNTER — Ambulatory Visit: Payer: Medicare HMO | Admitting: Family

## 2023-02-27 ENCOUNTER — Ambulatory Visit: Payer: Medicare HMO

## 2023-02-27 VITALS — BP 119/76 | HR 76 | Temp 98.2°F | Ht 65.0 in | Wt 173.8 lb

## 2023-02-27 DIAGNOSIS — I2510 Atherosclerotic heart disease of native coronary artery without angina pectoris: Secondary | ICD-10-CM | POA: Diagnosis not present

## 2023-02-27 DIAGNOSIS — F3342 Major depressive disorder, recurrent, in full remission: Secondary | ICD-10-CM

## 2023-02-27 DIAGNOSIS — G300 Alzheimer's disease with early onset: Secondary | ICD-10-CM | POA: Diagnosis not present

## 2023-02-27 DIAGNOSIS — F322 Major depressive disorder, single episode, severe without psychotic features: Secondary | ICD-10-CM | POA: Diagnosis not present

## 2023-02-27 DIAGNOSIS — K219 Gastro-esophageal reflux disease without esophagitis: Secondary | ICD-10-CM

## 2023-02-27 DIAGNOSIS — E1169 Type 2 diabetes mellitus with other specified complication: Secondary | ICD-10-CM | POA: Diagnosis not present

## 2023-02-27 DIAGNOSIS — E1159 Type 2 diabetes mellitus with other circulatory complications: Secondary | ICD-10-CM | POA: Diagnosis not present

## 2023-02-27 DIAGNOSIS — F02A0 Dementia in other diseases classified elsewhere, mild, without behavioral disturbance, psychotic disturbance, mood disturbance, and anxiety: Secondary | ICD-10-CM

## 2023-02-27 DIAGNOSIS — E538 Deficiency of other specified B group vitamins: Secondary | ICD-10-CM | POA: Diagnosis not present

## 2023-02-27 DIAGNOSIS — E663 Overweight: Secondary | ICD-10-CM

## 2023-02-27 DIAGNOSIS — R6889 Other general symptoms and signs: Secondary | ICD-10-CM | POA: Diagnosis not present

## 2023-02-27 DIAGNOSIS — N3281 Overactive bladder: Secondary | ICD-10-CM

## 2023-02-27 DIAGNOSIS — N1831 Chronic kidney disease, stage 3a: Secondary | ICD-10-CM | POA: Diagnosis not present

## 2023-02-27 DIAGNOSIS — F419 Anxiety disorder, unspecified: Secondary | ICD-10-CM | POA: Diagnosis not present

## 2023-02-27 DIAGNOSIS — K5902 Outlet dysfunction constipation: Secondary | ICD-10-CM

## 2023-02-27 DIAGNOSIS — E1142 Type 2 diabetes mellitus with diabetic polyneuropathy: Secondary | ICD-10-CM | POA: Diagnosis not present

## 2023-02-27 DIAGNOSIS — I152 Hypertension secondary to endocrine disorders: Secondary | ICD-10-CM | POA: Diagnosis not present

## 2023-02-27 DIAGNOSIS — J410 Simple chronic bronchitis: Secondary | ICD-10-CM | POA: Diagnosis not present

## 2023-02-27 DIAGNOSIS — D509 Iron deficiency anemia, unspecified: Secondary | ICD-10-CM | POA: Diagnosis not present

## 2023-02-27 DIAGNOSIS — E039 Hypothyroidism, unspecified: Secondary | ICD-10-CM

## 2023-02-27 DIAGNOSIS — Z7984 Long term (current) use of oral hypoglycemic drugs: Secondary | ICD-10-CM

## 2023-02-27 DIAGNOSIS — E785 Hyperlipidemia, unspecified: Secondary | ICD-10-CM | POA: Diagnosis not present

## 2023-02-27 DIAGNOSIS — Z79899 Other long term (current) drug therapy: Secondary | ICD-10-CM

## 2023-02-27 LAB — BAYER DCA HB A1C WAIVED: HB A1C (BAYER DCA - WAIVED): 6.1 % — ABNORMAL HIGH (ref 4.8–5.6)

## 2023-02-27 MED ORDER — BUSPIRONE HCL 5 MG PO TABS: 5.0000 mg | ORAL_TABLET | Freq: Three times a day (TID) | ORAL | 1 refills | Status: DC | PRN

## 2023-02-27 MED ORDER — LINACLOTIDE 145 MCG PO CAPS: 145.0000 ug | ORAL_CAPSULE | Freq: Every day | ORAL | 1 refills | Status: DC

## 2023-02-27 NOTE — Progress Notes (Signed)
Unable to complete eye exam due to patient's eyes not dilating

## 2023-02-27 NOTE — Patient Instructions (Signed)
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
## 2023-02-28 LAB — CMP14+EGFR
ALT: 11 [IU]/L (ref 0–32)
AST: 19 [IU]/L (ref 0–40)
Albumin: 4.2 g/dL (ref 3.8–4.8)
Alkaline Phosphatase: 45 [IU]/L (ref 44–121)
BUN/Creatinine Ratio: 12 (ref 12–28)
BUN: 14 mg/dL (ref 8–27)
Bilirubin Total: 0.4 mg/dL (ref 0.0–1.2)
CO2: 26 mmol/L (ref 20–29)
Calcium: 10.2 mg/dL (ref 8.7–10.3)
Chloride: 102 mmol/L (ref 96–106)
Creatinine, Ser: 1.21 mg/dL — ABNORMAL HIGH (ref 0.57–1.00)
Globulin, Total: 2.7 g/dL (ref 1.5–4.5)
Glucose: 104 mg/dL — ABNORMAL HIGH (ref 70–99)
Potassium: 4 mmol/L (ref 3.5–5.2)
Sodium: 139 mmol/L (ref 134–144)
Total Protein: 6.9 g/dL (ref 6.0–8.5)
eGFR: 46 mL/min/{1.73_m2} — ABNORMAL LOW (ref >59)

## 2023-02-28 LAB — MICROALBUMIN / CREATININE URINE RATIO
Creatinine, Urine: 103.9 mg/dL
Microalb/Creat Ratio: <3 mg/g{creat} (ref 0–29)
Microalbumin, Urine: <3 ug/mL

## 2023-02-28 LAB — ANEMIA PROFILE B
Basophils Absolute: 0.1 10*3/uL (ref 0.0–0.2)
Basos: 1 %
EOS (ABSOLUTE): 0.2 10*3/uL (ref 0.0–0.4)
Eos: 2 %
Ferritin: 171 ng/mL — ABNORMAL HIGH (ref 15–150)
Folate: >20 ng/mL (ref >3.0)
Hematocrit: 46.2 % (ref 34.0–46.6)
Hemoglobin: 15.3 g/dL (ref 11.1–15.9)
Immature Grans (Abs): 0.1 10*3/uL (ref 0.0–0.1)
Immature Granulocytes: 1 %
Iron Saturation: 26 % (ref 15–55)
Iron: 77 ug/dL (ref 27–139)
Lymphocytes Absolute: 3.9 10*3/uL — ABNORMAL HIGH (ref 0.7–3.1)
Lymphs: 45 %
MCH: 30.7 pg (ref 26.6–33.0)
MCHC: 33.1 g/dL (ref 31.5–35.7)
MCV: 93 fL (ref 79–97)
Monocytes Absolute: 0.8 10*3/uL (ref 0.1–0.9)
Monocytes: 9 %
Neutrophils Absolute: 3.6 10*3/uL (ref 1.4–7.0)
Neutrophils: 42 %
Platelets: 218 10*3/uL (ref 150–450)
RBC: 4.98 x10E6/uL (ref 3.77–5.28)
RDW: 12.7 % (ref 11.7–15.4)
Retic Ct Pct: 1.4 % (ref 0.6–2.6)
Total Iron Binding Capacity: 296 ug/dL (ref 250–450)
UIBC: 219 ug/dL (ref 118–369)
Vitamin B-12: 889 pg/mL (ref 232–1245)
WBC: 8.7 10*3/uL (ref 3.4–10.8)

## 2023-02-28 LAB — TSH: TSH: 1.08 u[IU]/mL (ref 0.450–4.500)

## 2023-03-02 ENCOUNTER — Other Ambulatory Visit: Payer: Self-pay | Admitting: Family

## 2023-03-02 DIAGNOSIS — E1149 Type 2 diabetes mellitus with other diabetic neurological complication: Secondary | ICD-10-CM

## 2023-03-16 ENCOUNTER — Other Ambulatory Visit: Payer: Self-pay | Admitting: Family

## 2023-03-16 DIAGNOSIS — E1142 Type 2 diabetes mellitus with diabetic polyneuropathy: Secondary | ICD-10-CM

## 2023-03-16 DIAGNOSIS — E1159 Type 2 diabetes mellitus with other circulatory complications: Secondary | ICD-10-CM

## 2023-03-16 DIAGNOSIS — K219 Gastro-esophageal reflux disease without esophagitis: Secondary | ICD-10-CM

## 2023-03-17 ENCOUNTER — Other Ambulatory Visit: Payer: Self-pay | Admitting: Family

## 2023-03-17 MED ORDER — MISOPROSTOL 200 MCG PO TABS: ORAL_TABLET | ORAL | 0 refills | Status: DC

## 2023-03-17 MED ORDER — BLOOD GLUCOSE METER KIT: PACK | 0 refills | Status: AC

## 2023-03-17 NOTE — Telephone Encounter (Signed)
Copied from CRM [id number]. Topic: Clinical - Medication Refill >> Mar 17, 2023 11:21 AM Desma Mcgregor wrote: Most Recent Primary Care Visit:  Provider: Daisy Blossom  Department: [facility]  Visit Type: DIABETIC RETINAL EXAM  Date: 02/27/2023  Medication: misoprostol (CYTOTEC) 200 MCG tablet and blood glucose meter kit and supplies (Test Strips)  Has the patient contacted their pharmacy? Yes (Agent: If no, request that the patient contact the pharmacy for the refill. If patient does not wish to contact the pharmacy document the reason why and proceed with request.) (Agent: If yes, when and what did the pharmacy advise?) No fills on file  Is this the correct pharmacy for this prescription? Yes If no, delete pharmacy and type the correct one.  This is the patient's preferred pharmacy:  [facility] [address] [place], [place] - [address] AT [facility] [address] [place] [address] Phone: [phone] Fax: [phone]   Has the prescription been filled recently? Yes  Is the patient out of the medication? No  Has the patient been seen for an appointment in the last year OR does the patient have an upcoming appointment? Yes  Can we respond through MyChart? No  Agent: Please be advised that Rx refills may take up to 3 business days. We ask that you follow-up with your pharmacy.

## 2023-03-24 ENCOUNTER — Other Ambulatory Visit: Payer: Self-pay | Admitting: Family

## 2023-03-24 DIAGNOSIS — K5900 Constipation, unspecified: Secondary | ICD-10-CM

## 2023-03-27 ENCOUNTER — Other Ambulatory Visit: Payer: Self-pay | Admitting: Family

## 2023-04-03 ENCOUNTER — Ambulatory Visit: Payer: Medicare HMO | Admitting: Family

## 2023-04-04 ENCOUNTER — Ambulatory Visit: Payer: Medicare HMO | Admitting: Family

## 2023-04-04 ENCOUNTER — Encounter: Payer: Self-pay | Admitting: Family

## 2023-04-04 VITALS — BP 117/70 | HR 76 | Temp 97.0°F | Wt 169.0 lb

## 2023-04-04 DIAGNOSIS — N1831 Chronic kidney disease, stage 3a: Secondary | ICD-10-CM | POA: Diagnosis not present

## 2023-04-04 DIAGNOSIS — J410 Simple chronic bronchitis: Secondary | ICD-10-CM | POA: Diagnosis not present

## 2023-04-04 DIAGNOSIS — F3342 Major depressive disorder, recurrent, in full remission: Secondary | ICD-10-CM | POA: Diagnosis not present

## 2023-04-04 DIAGNOSIS — F02A0 Dementia in other diseases classified elsewhere, mild, without behavioral disturbance, psychotic disturbance, mood disturbance, and anxiety: Secondary | ICD-10-CM

## 2023-04-04 DIAGNOSIS — F419 Anxiety disorder, unspecified: Secondary | ICD-10-CM

## 2023-04-04 DIAGNOSIS — E1159 Type 2 diabetes mellitus with other circulatory complications: Secondary | ICD-10-CM

## 2023-04-04 DIAGNOSIS — G300 Alzheimer's disease with early onset: Secondary | ICD-10-CM | POA: Diagnosis not present

## 2023-04-04 DIAGNOSIS — J45909 Unspecified asthma, uncomplicated: Secondary | ICD-10-CM

## 2023-04-04 DIAGNOSIS — Z7984 Long term (current) use of oral hypoglycemic drugs: Secondary | ICD-10-CM

## 2023-04-04 DIAGNOSIS — E1142 Type 2 diabetes mellitus with diabetic polyneuropathy: Secondary | ICD-10-CM

## 2023-04-04 DIAGNOSIS — K219 Gastro-esophageal reflux disease without esophagitis: Secondary | ICD-10-CM | POA: Diagnosis not present

## 2023-04-04 DIAGNOSIS — E039 Hypothyroidism, unspecified: Secondary | ICD-10-CM

## 2023-04-04 MED ORDER — DIAZEPAM 2 MG PO TABS: 2.0000 mg | ORAL_TABLET | Freq: Four times a day (QID) | ORAL | 0 refills | Status: DC | PRN
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
## 2023-04-04 NOTE — Patient Instructions (Signed)

## 2023-04-07 ENCOUNTER — Other Ambulatory Visit (HOSPITAL_COMMUNITY): Payer: Self-pay

## 2023-04-11 ENCOUNTER — Other Ambulatory Visit (HOSPITAL_COMMUNITY): Payer: Self-pay

## 2023-04-11 ENCOUNTER — Telehealth: Payer: Self-pay

## 2023-04-11 NOTE — Telephone Encounter (Signed)
Pharmacy Patient Advocate Encounter   Received notification from CoverMyMeds that prior authorization for diazePAM 2MG  tablets is required/requested.   Insurance verification completed.   The patient is insured through [employer] .   Prior Authorization for diazePAM 2MG  tablets has been APPROVED from 04-11-2023 to 02-27-2024   PA #/Case ID/Reference #: [id number]

## 2023-04-12 ENCOUNTER — Other Ambulatory Visit (HOSPITAL_COMMUNITY): Payer: Self-pay

## 2023-04-13 ENCOUNTER — Other Ambulatory Visit: Payer: Self-pay | Admitting: Family

## 2023-04-13 DIAGNOSIS — G4700 Insomnia, unspecified: Secondary | ICD-10-CM

## 2023-04-14 ENCOUNTER — Other Ambulatory Visit: Payer: Self-pay | Admitting: Family

## 2023-04-14 DIAGNOSIS — E1142 Type 2 diabetes mellitus with diabetic polyneuropathy: Secondary | ICD-10-CM

## 2023-04-21 ENCOUNTER — Other Ambulatory Visit: Payer: Self-pay | Admitting: Family

## 2023-04-24 ENCOUNTER — Other Ambulatory Visit: Payer: Self-pay

## 2023-04-24 DIAGNOSIS — E1142 Type 2 diabetes mellitus with diabetic polyneuropathy: Secondary | ICD-10-CM

## 2023-04-24 MED ORDER — GABAPENTIN 300 MG PO CAPS: ORAL_CAPSULE | ORAL | 1 refills | Status: DC

## 2023-04-24 NOTE — Addendum Note (Signed)
 Addended by: Ignacia Bayley on: 04/24/2023 09:06 AM   Modules accepted: Orders

## 2023-04-24 NOTE — Telephone Encounter (Signed)
 Copied from CRM [id number]. Topic: Clinical - Prescription Issue >> Apr 21, 2023  5:02 PM Antony Haste wrote: Reason for CRM: PT states her pharmacy - [facility] in [place], [place] has not received the refill order for gabapentin (NEURONTIN) 300 MG capsule. Advised it was signed off on 02/17, the patient is requesting to have this sent over again. She is still needing a 90-day supply.

## 2023-05-05 ENCOUNTER — Other Ambulatory Visit: Payer: Self-pay | Admitting: Family

## 2023-05-05 DIAGNOSIS — F03A0 Unspecified dementia, mild, without behavioral disturbance, psychotic disturbance, mood disturbance, and anxiety: Secondary | ICD-10-CM

## 2023-05-10 ENCOUNTER — Other Ambulatory Visit: Payer: Self-pay | Admitting: Family

## 2023-05-10 DIAGNOSIS — F02A4 Dementia in other diseases classified elsewhere, mild, with anxiety: Secondary | ICD-10-CM

## 2023-05-10 DIAGNOSIS — J309 Allergic rhinitis, unspecified: Secondary | ICD-10-CM

## 2023-06-09 ENCOUNTER — Other Ambulatory Visit: Payer: Self-pay | Admitting: Family

## 2023-06-09 DIAGNOSIS — E1142 Type 2 diabetes mellitus with diabetic polyneuropathy: Secondary | ICD-10-CM

## 2023-06-26 ENCOUNTER — Other Ambulatory Visit: Payer: Self-pay | Admitting: Family

## 2023-06-30 ENCOUNTER — Other Ambulatory Visit: Payer: Self-pay | Admitting: Family

## 2023-06-30 DIAGNOSIS — F03A0 Unspecified dementia, mild, without behavioral disturbance, psychotic disturbance, mood disturbance, and anxiety: Secondary | ICD-10-CM

## 2023-07-02 ENCOUNTER — Other Ambulatory Visit: Payer: Self-pay | Admitting: Family

## 2023-07-02 DIAGNOSIS — J4520 Mild intermittent asthma, uncomplicated: Secondary | ICD-10-CM

## 2023-07-12 ENCOUNTER — Telehealth: Payer: Self-pay

## 2023-07-12 NOTE — Progress Notes (Unsigned)
 Cardiology Office Note:   Date:  07/13/2023  ID:  Sylwia Odem, DOB 1946/02/06, MRN [id number] PCP: Yevette Hem, FNP  [facility] Providers Cardiologist:  Eilleen Grates, MD {  History of Present Illness:   Denise Gardner is a 78 y.o. female who presents for evaluation of CAD.  I saw her last in 2014.  She had a stress perfusion study which demonstrated an EF of 71% and questionable inferior defect with questionable artifact vs infarct with mild periinfarct ischemia.    I saw her in 2019. She had chest discomfort and on remarkable perfusion study with no evidence of ischemia. She had chest pain and had a negative perfusion study in 2021.   I saw her last in 2023.    She returns for follow-up.  She has been taking about 3 nitroglycerin  a week.  It seems like this has been a somewhat chronic pattern.  She gets chest pain when she is doing a little bit.  She walks with a cane.  She is not having any resting pain.  She has no new PND or orthopnea.  She has had no palpitations, presyncope or syncope.  She does get increasing shortness of breath with physical activity.  She describes her chest discomfort is about 6 out of 10.  She might take a nitroglycerin  occasionally to.  She lays down in the bed awake.  She is not describing associated nausea vomiting or diaphoresis.  She is trying to do some chair exercises for strength.  ROS: As stated in the HPI and negative for all other systems.  Studies Reviewed:    EKG:   EKG Interpretation Date/Time:  Thursday Jul 13 2023 13:07:56 EDT Ventricular Rate:  85 PR Interval:  162 QRS Duration:  72 QT Interval:  360 QTC Calculation: 428 R Axis:   45  Text Interpretation: Normal sinus rhythm Low voltage QRS Poor anterior R wave progression No significant change since last tracing Confirmed by Eilleen Grates ([id number]) on 07/13/2023 1:13:00 PM    Risk Assessment/Calculations:              Physical Exam:   VS:  BP 130/70   Pulse 86   Ht 5\' 5"   (1.651 m)   Wt 155 lb (70.3 kg)   SpO2 96%   BMI 25.79 kg/m    Wt Readings from Last 3 Encounters:  07/13/23 155 lb (70.3 kg)  07/13/23 156 lb (70.8 kg)  04/04/23 169 lb (76.7 kg)     GEN: Well nourished, well developed in no acute distress NECK: No JVD; No carotid bruits CARDIAC: RRR, no murmurs, rubs, gallops RESPIRATORY:  Clear to auscultation without rales, wheezing or rhonchi  ABDOMEN: Soft, non-tender, non-distended EXTREMITIES:  No edema; No deformity   ASSESSMENT AND PLAN:   CHEST PAIN: Her chest discomfort might have increased somewhat in frequency or intensity.  I like to get a coronary CT but she says she would not be able to travel to [facility] for this.  I am going to repeat a Lexiscan  Myoview .  I be looking for change compared to previous.  For now can continue the meds as listed    HTN:  The blood pressure is at target.  No change in therapy.   DYSLIPIDEMIA:  Her LDL is 42.  This was April of last year.  This is over a-year-old and I will repeat a lipid profile.  Follow up with me based on the results of this testing.  Signed, Eilleen Grates, MD

## 2023-07-12 NOTE — Telephone Encounter (Signed)
 Copied from CRM [id number]. Topic: Clinical - Request for Lab/Test Order >> Jul 12, 2023  4:50 PM [name] wrote: Reason for CRM: Patient called wanted to know if [facility] had reached out about labs she needed to have done. She would like to have those done at appt tomorrow. [facility] by Gwenetta Lennert. Thank You

## 2023-07-13 ENCOUNTER — Ambulatory Visit (INDEPENDENT_AMBULATORY_CARE_PROVIDER_SITE_OTHER): Payer: Medicare HMO | Admitting: Family

## 2023-07-13 ENCOUNTER — Ambulatory Visit (INDEPENDENT_AMBULATORY_CARE_PROVIDER_SITE_OTHER)

## 2023-07-13 ENCOUNTER — Encounter: Payer: Self-pay | Admitting: Family

## 2023-07-13 ENCOUNTER — Encounter: Payer: Self-pay | Admitting: Cardiology

## 2023-07-13 ENCOUNTER — Ambulatory Visit (INDEPENDENT_AMBULATORY_CARE_PROVIDER_SITE_OTHER): Admitting: Cardiology

## 2023-07-13 VITALS — BP 130/70 | HR 86 | Ht 65.0 in | Wt 155.0 lb

## 2023-07-13 VITALS — BP 122/72 | HR 91 | Temp 98.2°F | Ht 65.0 in | Wt 156.0 lb

## 2023-07-13 DIAGNOSIS — I10 Essential (primary) hypertension: Secondary | ICD-10-CM | POA: Diagnosis not present

## 2023-07-13 DIAGNOSIS — K219 Gastro-esophageal reflux disease without esophagitis: Secondary | ICD-10-CM

## 2023-07-13 DIAGNOSIS — R072 Precordial pain: Secondary | ICD-10-CM

## 2023-07-13 DIAGNOSIS — F03A0 Unspecified dementia, mild, without behavioral disturbance, psychotic disturbance, mood disturbance, and anxiety: Secondary | ICD-10-CM

## 2023-07-13 DIAGNOSIS — J45909 Unspecified asthma, uncomplicated: Secondary | ICD-10-CM

## 2023-07-13 DIAGNOSIS — F419 Anxiety disorder, unspecified: Secondary | ICD-10-CM

## 2023-07-13 DIAGNOSIS — R7309 Other abnormal glucose: Secondary | ICD-10-CM | POA: Diagnosis not present

## 2023-07-13 DIAGNOSIS — J449 Chronic obstructive pulmonary disease, unspecified: Secondary | ICD-10-CM

## 2023-07-13 DIAGNOSIS — E1142 Type 2 diabetes mellitus with diabetic polyneuropathy: Secondary | ICD-10-CM | POA: Diagnosis not present

## 2023-07-13 DIAGNOSIS — E785 Hyperlipidemia, unspecified: Secondary | ICD-10-CM | POA: Diagnosis not present

## 2023-07-13 DIAGNOSIS — Z0001 Encounter for general adult medical examination with abnormal findings: Secondary | ICD-10-CM | POA: Diagnosis not present

## 2023-07-13 DIAGNOSIS — I2510 Atherosclerotic heart disease of native coronary artery without angina pectoris: Secondary | ICD-10-CM | POA: Diagnosis not present

## 2023-07-13 DIAGNOSIS — Z780 Asymptomatic menopausal state: Secondary | ICD-10-CM

## 2023-07-13 DIAGNOSIS — F322 Major depressive disorder, single episode, severe without psychotic features: Secondary | ICD-10-CM

## 2023-07-13 DIAGNOSIS — N1831 Chronic kidney disease, stage 3a: Secondary | ICD-10-CM

## 2023-07-13 DIAGNOSIS — E1169 Type 2 diabetes mellitus with other specified complication: Secondary | ICD-10-CM

## 2023-07-13 DIAGNOSIS — F3342 Major depressive disorder, recurrent, in full remission: Secondary | ICD-10-CM

## 2023-07-13 DIAGNOSIS — Z0000 Encounter for general adult medical examination without abnormal findings: Secondary | ICD-10-CM

## 2023-07-13 DIAGNOSIS — E1159 Type 2 diabetes mellitus with other circulatory complications: Secondary | ICD-10-CM | POA: Diagnosis not present

## 2023-07-13 DIAGNOSIS — K5902 Outlet dysfunction constipation: Secondary | ICD-10-CM

## 2023-07-13 DIAGNOSIS — E538 Deficiency of other specified B group vitamins: Secondary | ICD-10-CM

## 2023-07-13 DIAGNOSIS — D509 Iron deficiency anemia, unspecified: Secondary | ICD-10-CM

## 2023-07-13 DIAGNOSIS — I152 Hypertension secondary to endocrine disorders: Secondary | ICD-10-CM

## 2023-07-13 DIAGNOSIS — G4700 Insomnia, unspecified: Secondary | ICD-10-CM

## 2023-07-13 LAB — BAYER DCA HB A1C WAIVED: HB A1C (BAYER DCA - WAIVED): 5.8 % — ABNORMAL HIGH (ref 4.8–5.6)

## 2023-07-13 MED ORDER — OMEPRAZOLE 20 MG PO CPDR: DELAYED_RELEASE_CAPSULE | ORAL | 0 refills | Status: DC

## 2023-07-13 MED ORDER — AMLODIPINE BESYLATE 10 MG PO TABS: ORAL_TABLET | ORAL | 1 refills | Status: DC

## 2023-07-13 MED ORDER — NITROGLYCERIN 0.4 MG SL SUBL: SUBLINGUAL_TABLET | SUBLINGUAL | 3 refills | Status: DC

## 2023-07-13 MED ORDER — ESCITALOPRAM OXALATE 10 MG PO TABS: ORAL_TABLET | ORAL | 3 refills | Status: DC

## 2023-07-13 MED ORDER — DIAZEPAM 2 MG PO TABS: 2.0000 mg | ORAL_TABLET | Freq: Four times a day (QID) | ORAL | 1 refills | Status: DC | PRN

## 2023-07-13 MED ORDER — LINACLOTIDE 145 MCG PO CAPS: 145.0000 ug | ORAL_CAPSULE | Freq: Every day | ORAL | 1 refills | Status: DC

## 2023-07-13 MED ORDER — METOPROLOL TARTRATE 25 MG PO TABS: ORAL_TABLET | ORAL | 0 refills | Status: DC

## 2023-07-13 MED ORDER — TRAZODONE HCL 100 MG PO TABS: 100.0000 mg | ORAL_TABLET | Freq: Every day | ORAL | 2 refills | Status: DC

## 2023-07-13 MED ORDER — NITROGLYCERIN 0.4 MG/SPRAY TL SOLN: 1.0000 | 12 refills | Status: AC | PRN

## 2023-07-13 MED ORDER — GABAPENTIN 300 MG PO CAPS: ORAL_CAPSULE | ORAL | 1 refills | Status: DC

## 2023-07-13 MED ORDER — MEMANTINE HCL 10 MG PO TABS: ORAL_TABLET | ORAL | 0 refills | Status: DC

## 2023-07-13 MED ORDER — ALBUTEROL SULFATE (2.5 MG/3ML) 0.083% IN NEBU: 2.5000 mg | INHALATION_SOLUTION | Freq: Four times a day (QID) | RESPIRATORY_TRACT | 1 refills | Status: DC | PRN

## 2023-07-13 MED ORDER — DONEPEZIL HCL 10 MG PO TABS: 10.0000 mg | ORAL_TABLET | Freq: Every day | ORAL | 2 refills | Status: DC

## 2023-07-13 NOTE — Patient Instructions (Addendum)
 Medication Instructions:  Your physician recommends that you continue on your current medications as directed. Please refer to the Current Medication list given to you today.     Labwork: Fasting Lipid at [facility]  Testing/Procedures: Your physician has requested that you have a lexiscan  myoview . For further information please visit [email or web address] Please follow instruction sheet, as given.   Follow-Up: To be determined after lexiscan   Any Other Special Instructions Will Be Listed Below (If Applicable).  If you need a refill on your cardiac medications before your next appointment, please call your pharmacy.   Thank you for choosing [facility] !

## 2023-07-13 NOTE — Patient Instructions (Signed)

## 2023-07-13 NOTE — Telephone Encounter (Signed)
 Yes lab orders are in EPIC.
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
## 2023-07-14 ENCOUNTER — Other Ambulatory Visit: Payer: Self-pay | Admitting: Family

## 2023-07-14 ENCOUNTER — Ambulatory Visit: Payer: Self-pay | Admitting: Family

## 2023-07-14 DIAGNOSIS — Z780 Asymptomatic menopausal state: Secondary | ICD-10-CM | POA: Diagnosis not present

## 2023-07-14 DIAGNOSIS — M85852 Other specified disorders of bone density and structure, left thigh: Secondary | ICD-10-CM | POA: Diagnosis not present

## 2023-07-14 DIAGNOSIS — J4520 Mild intermittent asthma, uncomplicated: Secondary | ICD-10-CM

## 2023-07-14 MED ORDER — LEVOTHYROXINE SODIUM 75 MCG PO TABS: 75.0000 ug | ORAL_TABLET | Freq: Every day | ORAL | 1 refills | Status: DC

## 2023-07-16 LAB — LIPID PANEL
Chol/HDL Ratio: 1.8 ratio (ref 0.0–4.4)
Cholesterol, Total: 121 mg/dL (ref 100–199)
HDL: 66 mg/dL (ref >39)
LDL Chol Calc (NIH): 41 mg/dL (ref 0–99)
Triglycerides: 68 mg/dL (ref 0–149)
VLDL Cholesterol Cal: 14 mg/dL (ref 5–40)

## 2023-07-16 LAB — CMP14+EGFR
ALT: 9 IU/L (ref 0–32)
AST: 13 IU/L (ref 0–40)
Albumin: 3.6 g/dL — ABNORMAL LOW (ref 3.8–4.8)
Alkaline Phosphatase: 51 IU/L (ref 44–121)
BUN/Creatinine Ratio: 13 (ref 12–28)
BUN: 14 mg/dL (ref 8–27)
Bilirubin Total: 0.2 mg/dL (ref 0.0–1.2)
CO2: 21 mmol/L (ref 20–29)
Calcium: 10.1 mg/dL (ref 8.7–10.3)
Chloride: 100 mmol/L (ref 96–106)
Creatinine, Ser: 1.04 mg/dL — ABNORMAL HIGH (ref 0.57–1.00)
Globulin, Total: 2.9 g/dL (ref 1.5–4.5)
Glucose: 114 mg/dL — ABNORMAL HIGH (ref 70–99)
Potassium: 3.8 mmol/L (ref 3.5–5.2)
Sodium: 141 mmol/L (ref 134–144)
Total Protein: 6.5 g/dL (ref 6.0–8.5)
eGFR: 54 mL/min/{1.73_m2} — ABNORMAL LOW (ref >59)

## 2023-07-16 LAB — METHYLMALONIC ACID, SERUM: Methylmalonic Acid: 143 nmol/L (ref 0–378)

## 2023-07-16 LAB — TSH: TSH: 0.363 u[IU]/mL — ABNORMAL LOW (ref 0.450–4.500)

## 2023-07-16 LAB — VITAMIN B12: Vitamin B-12: 1348 pg/mL — ABNORMAL HIGH (ref 232–1245)

## 2023-07-17 ENCOUNTER — Other Ambulatory Visit (HOSPITAL_COMMUNITY): Payer: Self-pay

## 2023-07-17 ENCOUNTER — Telehealth: Payer: Self-pay | Admitting: Pharmacy Technician

## 2023-07-17 NOTE — Telephone Encounter (Signed)
 Pharmacy Patient Advocate Encounter   Received notification from CoverMyMeds that prior authorization for nitroglycerin  spray is required/requested.   Insurance verification completed.   The patient is insured through [employer] .   Per test claim: PA required; PA submitted to above mentioned insurance via CoverMyMeds Key/confirmation #/EOC [id number] Status is pending

## 2023-07-17 NOTE — Telephone Encounter (Signed)
 Pharmacy Patient Advocate Encounter  Received notification from [employer] that Prior Authorization for nitr spray has been APPROVED from 03/01/23 to 02/28/24   PA #/Case ID/Reference #: [id number]

## 2023-07-18 ENCOUNTER — Ambulatory Visit: Payer: Self-pay

## 2023-07-18 NOTE — Telephone Encounter (Signed)
 cALLED AND SPOKE WITH PATIENT MADE 2 MTH FOLLOW UP

## 2023-07-18 NOTE — Telephone Encounter (Signed)
  Chief Complaint: lab results Symptoms: N/A. Frequency: N/A. Pertinent Negatives: Patient denies N/A. Disposition: [] ED /[] Urgent Care (no appt availability in office) / [] Appointment(In office/virtual)/ []  [place] [facility]/ [] Home Care/ [] Refused Recommended Disposition /[] [facility]/ [x]  Follow-up with PCP Additional Notes: Read FNP Tommas Fragmin message to patient, "Kidney and liver function stable Thyroid  abnormal- Levothyroxine  decreased to 75 mcg, needs follow up in 2 months Vit B 12 elevated- Stop any Vit B A1C at goal LDL stable- Low fat diet and exercise"  Patient verbalizes understanding. She is asking about the follow up in 2 months. She would like to know if she needs to come in sooner than her appt scheduled 10/13/23 and if she needs to schedule labs as well. She would like follow up call about this.  Copied from CRM [id number]. Topic: Clinical - Lab/Test Results >> Jul 18, 2023 11:43 AM Carrielelia G wrote: Read Lab results to patient but still would like to speak with medical Reason for Disposition  Caller requesting lab results  (Exception: Routine or non-urgent lab result.)  Answer Assessment - Initial Assessment Questions 1. REASON FOR CALL or QUESTION: "What is your reason for calling today?" or "How can I best help you?" or "What question do you have that I can help answer?"    Patient asking about her labs. Read note from provider: Kidney and liver function stable Thyroid  abnormal- Levothyroxine  decreased to 75 mcg, needs follow up in 2 months Vit B 12 elevated- Stop any Vit B A1C at goal LDL stable- Low fat diet and exercise  Patient verbalizes understanding. She is asking about the follow up in 2 months. She would like to know if she needs to come in sooner than her appt scheduled 10/13/23 and if she needs to schedule labs as well.  2. CALLER: Document the source of call. (e.g., laboratory, patient).     Patient.  Protocols used: PCP Call - No  Triage-A-AH

## 2023-07-20 ENCOUNTER — Encounter (HOSPITAL_COMMUNITY)

## 2023-07-20 ENCOUNTER — Other Ambulatory Visit

## 2023-07-21 ENCOUNTER — Other Ambulatory Visit: Payer: Self-pay | Admitting: Family

## 2023-07-21 DIAGNOSIS — G4700 Insomnia, unspecified: Secondary | ICD-10-CM

## 2023-07-24 ENCOUNTER — Other Ambulatory Visit: Payer: Self-pay | Admitting: Family

## 2023-07-24 DIAGNOSIS — J4520 Mild intermittent asthma, uncomplicated: Secondary | ICD-10-CM

## 2023-07-27 ENCOUNTER — Other Ambulatory Visit: Payer: Self-pay | Admitting: *Deleted

## 2023-07-27 DIAGNOSIS — D509 Iron deficiency anemia, unspecified: Secondary | ICD-10-CM

## 2023-07-28 ENCOUNTER — Other Ambulatory Visit

## 2023-07-28 DIAGNOSIS — D509 Iron deficiency anemia, unspecified: Secondary | ICD-10-CM | POA: Diagnosis not present

## 2023-07-29 ENCOUNTER — Other Ambulatory Visit: Payer: Self-pay | Admitting: Family

## 2023-07-29 DIAGNOSIS — J4520 Mild intermittent asthma, uncomplicated: Secondary | ICD-10-CM

## 2023-07-29 LAB — CBC WITH DIFFERENTIAL/PLATELET
Basophils Absolute: 0 10*3/uL (ref 0.0–0.2)
Basos: 0 %
EOS (ABSOLUTE): 0 10*3/uL (ref 0.0–0.4)
Eos: 0 %
Hematocrit: 43.7 % (ref 34.0–46.6)
Hemoglobin: 14.3 g/dL (ref 11.1–15.9)
Immature Grans (Abs): 0.1 10*3/uL (ref 0.0–0.1)
Immature Granulocytes: 1 %
Lymphocytes Absolute: 1.3 10*3/uL (ref 0.7–3.1)
Lymphs: 9 %
MCH: 29.9 pg (ref 26.6–33.0)
MCHC: 32.7 g/dL (ref 31.5–35.7)
MCV: 91 fL (ref 79–97)
Monocytes Absolute: 0.7 10*3/uL (ref 0.1–0.9)
Monocytes: 5 %
Neutrophils Absolute: 11.9 10*3/uL — ABNORMAL HIGH (ref 1.4–7.0)
Neutrophils: 85 %
Platelets: 235 10*3/uL (ref 150–450)
RBC: 4.79 x10E6/uL (ref 3.77–5.28)
RDW: 13.8 % (ref 11.7–15.4)
WBC: 14 10*3/uL — ABNORMAL HIGH (ref 3.4–10.8)

## 2023-07-29 LAB — IRON AND TIBC
Iron Saturation: 5 % — CL (ref 15–55)
Iron: 13 ug/dL — ABNORMAL LOW (ref 27–139)
Total Iron Binding Capacity: 242 ug/dL — ABNORMAL LOW (ref 250–450)
UIBC: 229 ug/dL (ref 118–369)

## 2023-07-29 LAB — FOLATE: Folate: >20 ng/mL (ref >3.0)

## 2023-07-29 LAB — FERRITIN: Ferritin: 214 ng/mL — ABNORMAL HIGH (ref 15–150)
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
## 2023-07-31 ENCOUNTER — Encounter (HOSPITAL_BASED_OUTPATIENT_CLINIC_OR_DEPARTMENT_OTHER)
Admission: RE | Admit: 2023-07-31 | Discharge: 2023-07-31 | Disposition: A | Discharge status: Home / Self Care | Source: Ambulatory Visit | Attending: Cardiology | Admitting: Cardiology

## 2023-07-31 ENCOUNTER — Inpatient Hospital Stay: Payer: Medicare HMO | Attending: Physician Assistant | Admitting: Physician Assistant

## 2023-07-31 ENCOUNTER — Other Ambulatory Visit (HOSPITAL_COMMUNITY)
Admission: RE | Admit: 2023-07-31 | Discharge: 2023-07-31 | Disposition: A | Discharge status: Home / Self Care | Source: Ambulatory Visit | Attending: Cardiology | Admitting: Cardiology

## 2023-07-31 ENCOUNTER — Ambulatory Visit (HOSPITAL_COMMUNITY)
Admission: RE | Admit: 2023-07-31 | Discharge: 2023-07-31 | Disposition: A | Discharge status: Home / Self Care | Source: Ambulatory Visit | Attending: Cardiology | Admitting: Cardiology

## 2023-07-31 VITALS — BP 126/69 | HR 81 | Temp 97.9°F | Resp 20 | Wt 159.2 lb

## 2023-07-31 DIAGNOSIS — E538 Deficiency of other specified B group vitamins: Secondary | ICD-10-CM | POA: Diagnosis not present

## 2023-07-31 DIAGNOSIS — D509 Iron deficiency anemia, unspecified: Secondary | ICD-10-CM | POA: Diagnosis not present

## 2023-07-31 DIAGNOSIS — K5900 Constipation, unspecified: Secondary | ICD-10-CM | POA: Insufficient documentation

## 2023-07-31 DIAGNOSIS — R072 Precordial pain: Secondary | ICD-10-CM

## 2023-07-31 DIAGNOSIS — D631 Anemia in chronic kidney disease: Secondary | ICD-10-CM | POA: Diagnosis not present

## 2023-07-31 DIAGNOSIS — N1831 Chronic kidney disease, stage 3a: Secondary | ICD-10-CM | POA: Insufficient documentation

## 2023-07-31 LAB — NM MYOCAR MULTI W/SPECT W/WALL MOTION / EF
Base ST Depression (mm): 0 mm
MPHR: 140 {beats}/min
Nuc Stress EF: 59 %
Peak HR: 95 {beats}/min
Percent HR: 67 %
Rest HR: 72 {beats}/min
ST Depression (mm): 0 mm
TID: 3

## 2023-07-31 LAB — LIPID PANEL
Cholesterol: 116 mg/dL (ref 0–200)
HDL: 51 mg/dL (ref >40)
LDL Cholesterol: 48 mg/dL (ref 0–99)
Total CHOL/HDL Ratio: 2.3 ratio
Triglycerides: 85 mg/dL (ref <150)
VLDL: 17 mg/dL (ref 0–40)

## 2023-07-31 MED ORDER — SODIUM CHLORIDE FLUSH 0.9 % IV SOLN
INTRAVENOUS | Status: AC
Start: 2023-07-31 — End: 2023-07-31
  Administered 2023-07-31: 10 mL via INTRAVENOUS
  Filled 2023-07-31: qty 10

## 2023-07-31 MED ORDER — TECHNETIUM TC 99M TETROFOSMIN IV KIT
30.0000 | PACK | Freq: Once | INTRAVENOUS | Status: AC | PRN
  Administered 2023-07-31: 30 via INTRAVENOUS

## 2023-07-31 MED ORDER — TECHNETIUM TC 99M TETROFOSMIN IV KIT
10.0000 | PACK | Freq: Once | INTRAVENOUS | Status: AC | PRN
  Administered 2023-07-31: 10 via INTRAVENOUS

## 2023-07-31 MED ORDER — REGADENOSON 0.4 MG/5ML IV SOLN
INTRAVENOUS | Status: AC
  Administered 2023-07-31: 0.4 mg via INTRAVENOUS
  Filled 2023-07-31: qty 5

## 2023-07-31 NOTE — Patient Instructions (Signed)
 [facility] at [facility] **VISIT SUMMARY & IMPORTANT INSTRUCTIONS **   You were seen today by Sheril Dines PA-C for your follow-up visit.    HISTORY OF ANEMIA Your most recent labs show normal blood levels, but your iron  levels are low.  We will schedule your for one dose of IV iron . Continue taking iron  tablet, but decrease this to every other day.  FOLLOW-UP APPOINTMENT: We will see you for office visit in 1 year.  Please get your labs done at [facility] before your visit.  **If you feel that you need to be seen sooner for any reason between now and your next appointment, please do not hesitate to call our office and schedule an appointment.  ** Thank you for trusting me with your healthcare!  I strive to provide all of my patients with quality care at each visit.  If you receive a survey for this visit, I would be so grateful to you for taking the time to provide feedback.  Thank you in advance!  ~ Markie Frith                   Dr. Paulett Boros   &   Sheril Dines, PA-C   - - - - - - - - - - - - - - - - - -    Thank you for choosing [facility] at [facility] to provide your oncology and hematology care.  To afford each patient quality time with our provider, please arrive at least 15 minutes before your scheduled appointment time.   If you have a lab appointment with the Cancer Center please come in thru the Main Entrance and check in at the main information desk.  You need to re-schedule your appointment should you arrive 10 or more minutes late.  We strive to give you quality time with our providers, and arriving late affects you and other patients whose appointments are after yours.  Also, if you no show three or more times for appointments you may be dismissed from the clinic at the providers discretion.     Again, thank you for choosing [facility].  Our hope is that these requests will decrease the  amount of time that you wait before being seen by our physicians.       _____________________________________________________________  Should you have questions after your visit to [facility], please contact our office at [phone] and follow the prompts.  Our office hours are 8:00 a.m. and 4:30 p.m. Monday - Friday.  Please note that voicemails left after 4:00 p.m. may not be returned until the following business day.  We are closed weekends and major holidays.  You do have access to a nurse 24-7, just call the main number to the clinic [phone] and do not press any options, hold on the line and a nurse will answer the phone.    For prescription refill requests, have your pharmacy contact our office and allow 72 hours.

## 2023-08-04 ENCOUNTER — Ambulatory Visit: Payer: Self-pay | Admitting: Cardiology

## 2023-08-04 DIAGNOSIS — H5203 Hypermetropia, bilateral: Secondary | ICD-10-CM | POA: Diagnosis not present

## 2023-08-04 DIAGNOSIS — E119 Type 2 diabetes mellitus without complications: Secondary | ICD-10-CM | POA: Diagnosis not present

## 2023-08-06 ENCOUNTER — Ambulatory Visit: Payer: Self-pay | Admitting: Cardiology

## 2023-08-06 ENCOUNTER — Other Ambulatory Visit: Payer: Self-pay | Admitting: Family

## 2023-08-06 DIAGNOSIS — F03A0 Unspecified dementia, mild, without behavioral disturbance, psychotic disturbance, mood disturbance, and anxiety: Secondary | ICD-10-CM

## 2023-08-21 ENCOUNTER — Other Ambulatory Visit: Payer: Self-pay | Admitting: Family

## 2023-08-22 ENCOUNTER — Other Ambulatory Visit: Payer: Self-pay | Admitting: Family

## 2023-08-22 DIAGNOSIS — Z1231 Encounter for screening mammogram for malignant neoplasm of breast: Secondary | ICD-10-CM

## 2023-08-23 ENCOUNTER — Ambulatory Visit
Admission: RE | Admit: 2023-08-23 | Discharge: 2023-08-23 | Disposition: A | Discharge status: Home / Self Care | Source: Ambulatory Visit | Attending: Family | Admitting: Family

## 2023-08-23 DIAGNOSIS — Z1231 Encounter for screening mammogram for malignant neoplasm of breast: Secondary | ICD-10-CM

## 2023-09-01 ENCOUNTER — Other Ambulatory Visit: Payer: Self-pay | Admitting: Family

## 2023-09-01 DIAGNOSIS — E1142 Type 2 diabetes mellitus with diabetic polyneuropathy: Secondary | ICD-10-CM

## 2023-09-05 DIAGNOSIS — S79911A Unspecified injury of right hip, initial encounter: Secondary | ICD-10-CM | POA: Diagnosis not present

## 2023-09-05 DIAGNOSIS — S99821A Other specified injuries of right foot, initial encounter: Secondary | ICD-10-CM | POA: Diagnosis not present

## 2023-09-05 DIAGNOSIS — S3982XA Other specified injuries of lower back, initial encounter: Secondary | ICD-10-CM | POA: Diagnosis not present

## 2023-09-05 DIAGNOSIS — M47816 Spondylosis without myelopathy or radiculopathy, lumbar region: Secondary | ICD-10-CM | POA: Diagnosis not present

## 2023-09-05 DIAGNOSIS — M5116 Intervertebral disc disorders with radiculopathy, lumbar region: Secondary | ICD-10-CM | POA: Diagnosis not present

## 2023-09-05 DIAGNOSIS — S9031XA Contusion of right foot, initial encounter: Secondary | ICD-10-CM | POA: Diagnosis not present

## 2023-09-05 DIAGNOSIS — M5441 Lumbago with sciatica, right side: Secondary | ICD-10-CM | POA: Diagnosis not present

## 2023-09-05 DIAGNOSIS — S300XXA Contusion of lower back and pelvis, initial encounter: Secondary | ICD-10-CM | POA: Diagnosis not present

## 2023-09-05 DIAGNOSIS — M19071 Primary osteoarthritis, right ankle and foot: Secondary | ICD-10-CM | POA: Diagnosis not present

## 2023-09-05 DIAGNOSIS — W19XXXA Unspecified fall, initial encounter: Secondary | ICD-10-CM | POA: Diagnosis not present

## 2023-09-05 DIAGNOSIS — M51369 Other intervertebral disc degeneration, lumbar region without mention of lumbar back pain or lower extremity pain: Secondary | ICD-10-CM | POA: Diagnosis not present

## 2023-09-05 DIAGNOSIS — S79912A Unspecified injury of left hip, initial encounter: Secondary | ICD-10-CM | POA: Diagnosis not present

## 2023-09-05 DIAGNOSIS — M5442 Lumbago with sciatica, left side: Secondary | ICD-10-CM | POA: Diagnosis not present

## 2023-09-08 ENCOUNTER — Other Ambulatory Visit: Payer: Self-pay | Admitting: Family

## 2023-09-10 ENCOUNTER — Other Ambulatory Visit: Payer: Self-pay | Admitting: Family

## 2023-09-10 DIAGNOSIS — K219 Gastro-esophageal reflux disease without esophagitis: Secondary | ICD-10-CM

## 2023-09-10 DIAGNOSIS — F419 Anxiety disorder, unspecified: Secondary | ICD-10-CM

## 2023-09-10 DIAGNOSIS — E1159 Type 2 diabetes mellitus with other circulatory complications: Secondary | ICD-10-CM

## 2023-09-10 DIAGNOSIS — E1142 Type 2 diabetes mellitus with diabetic polyneuropathy: Secondary | ICD-10-CM

## 2023-09-10 DIAGNOSIS — F3342 Major depressive disorder, recurrent, in full remission: Secondary | ICD-10-CM

## 2023-09-12 ENCOUNTER — Inpatient Hospital Stay: Attending: Physician Assistant

## 2023-09-12 VITALS — BP 140/81 | HR 74 | Temp 97.0°F | Resp 20

## 2023-09-12 DIAGNOSIS — D509 Iron deficiency anemia, unspecified: Secondary | ICD-10-CM | POA: Insufficient documentation

## 2023-09-12 MED ORDER — CETIRIZINE HCL 10 MG/ML IV SOLN
5.0000 mg | Freq: Once | INTRAVENOUS | Status: AC
  Administered 2023-09-12: 5 mg via INTRAVENOUS
  Filled 2023-09-12: qty 1

## 2023-09-12 MED ORDER — SODIUM CHLORIDE 0.9 % IV SOLN
400.0000 mg | Freq: Once | INTRAVENOUS | Status: AC
  Administered 2023-09-12: 400 mg via INTRAVENOUS
  Filled 2023-09-12: qty 400

## 2023-09-12 MED ORDER — ACETAMINOPHEN 325 MG PO TABS
650.0000 mg | ORAL_TABLET | Freq: Once | ORAL | Status: AC
  Administered 2023-09-12: 650 mg via ORAL
  Filled 2023-09-12: qty 2

## 2023-09-12 MED ORDER — SODIUM CHLORIDE 0.9 % IV SOLN: INTRAVENOUS | Status: DC

## 2023-09-12 NOTE — Progress Notes (Signed)
Patient presents today for iron infusion.  Patient is in satisfactory condition with no new complaints voiced.  Vital signs are stable.  We will proceed with infusion per provider orders.    Peripheral IV started with good blood return pre and post infusion.  Venofer 400 mg given today per MD orders. Tolerated infusion without adverse affects. Vital signs stable. No complaints at this time. Discharged from clinic via wheelchair in stable condition. Alert and oriented x 3. F/U with [facility] as scheduled.

## 2023-09-12 NOTE — Patient Instructions (Signed)
 [facility] [place] - A DEPT OF [facility]  Discharge Instructions: Thank you for choosing [facility] to provide your oncology and hematology care.  If you have a lab appointment with the Cancer Center - please note that after April 8th, 2024, all labs will be drawn in the cancer center.  You do not have to check in or register with the main entrance as you have in the past but will complete your check-in in the cancer center.  Wear comfortable clothing and clothing appropriate for easy access to any Portacath or PICC line.   We strive to give you quality time with your provider. You may need to reschedule your appointment if you arrive late (15 or more minutes).  Arriving late affects you and other patients whose appointments are after yours.  Also, if you miss three or more appointments without notifying the office, you may be dismissed from the clinic at the provider's discretion.      For prescription refill requests, have your pharmacy contact our office and allow 72 hours for refills to be completed.    Today you received Venofer 400 mg IV iron infusion.    BELOW ARE SYMPTOMS THAT SHOULD BE REPORTED IMMEDIATELY: *FEVER GREATER THAN 100.4 F (38 C) OR HIGHER *CHILLS OR SWEATING *NAUSEA AND VOMITING THAT IS NOT CONTROLLED WITH YOUR NAUSEA MEDICATION *UNUSUAL SHORTNESS OF BREATH *UNUSUAL BRUISING OR BLEEDING *URINARY PROBLEMS (pain or burning when urinating, or frequent urination) *BOWEL PROBLEMS (unusual diarrhea, constipation, pain near the anus) TENDERNESS IN MOUTH AND THROAT WITH OR WITHOUT PRESENCE OF ULCERS (sore throat, sores in mouth, or a toothache) UNUSUAL RASH, SWELLING OR PAIN  UNUSUAL VAGINAL DISCHARGE OR ITCHING   Items with * indicate a potential emergency and should be followed up as soon as possible or go to the Emergency Department if any problems should occur.  Please show the CHEMOTHERAPY ALERT CARD or IMMUNOTHERAPY ALERT CARD  at check-in to the Emergency Department and triage nurse.  Should you have questions after your visit or need to cancel or reschedule your appointment, please contact [facility] [place] - A DEPT OF Eligha Bridegroom [facility] [phone]  and follow the prompts.  Office hours are 8:00 a.m. to 4:30 p.m. Monday - Friday. Please note that voicemails left after 4:00 p.m. may not be returned until the following business day.  We are closed weekends and major holidays. You have access to a nurse at all times for urgent questions. Please call the main number to the clinic [phone] and follow the prompts.  For any non-urgent questions, you may also contact your provider using MyChart. We now offer e-Visits for anyone 37 and older to request care online for non-urgent symptoms. For details visit [email or web address].   Also download the MyChart app! Go to the app store, search "MyChart", open the app, select [place], and log in with your MyChart username and password.

## 2023-09-18 ENCOUNTER — Ambulatory Visit: Admitting: Family

## 2023-09-18 ENCOUNTER — Encounter: Payer: Self-pay | Admitting: Family

## 2023-09-18 ENCOUNTER — Encounter

## 2023-09-18 ENCOUNTER — Telehealth: Payer: Self-pay | Admitting: *Deleted

## 2023-09-18 ENCOUNTER — Other Ambulatory Visit: Payer: Self-pay | Admitting: Family

## 2023-09-18 ENCOUNTER — Other Ambulatory Visit: Payer: Self-pay | Admitting: Family Medicine

## 2023-09-18 VITALS — BP 121/78 | HR 82 | Temp 97.1°F | Ht 65.0 in | Wt 150.0 lb

## 2023-09-18 DIAGNOSIS — Y92009 Unspecified place in unspecified non-institutional (private) residence as the place of occurrence of the external cause: Secondary | ICD-10-CM

## 2023-09-18 DIAGNOSIS — Z7984 Long term (current) use of oral hypoglycemic drugs: Secondary | ICD-10-CM | POA: Diagnosis not present

## 2023-09-18 DIAGNOSIS — E1142 Type 2 diabetes mellitus with diabetic polyneuropathy: Secondary | ICD-10-CM | POA: Diagnosis not present

## 2023-09-18 DIAGNOSIS — E039 Hypothyroidism, unspecified: Secondary | ICD-10-CM

## 2023-09-18 DIAGNOSIS — M545 Low back pain, unspecified: Secondary | ICD-10-CM

## 2023-09-18 DIAGNOSIS — S32000D Wedge compression fracture of unspecified lumbar vertebra, subsequent encounter for fracture with routine healing: Secondary | ICD-10-CM

## 2023-09-18 DIAGNOSIS — W19XXXA Unspecified fall, initial encounter: Secondary | ICD-10-CM

## 2023-09-18 MED ORDER — TRAMADOL HCL 50 MG PO TABS: 50.0000 mg | ORAL_TABLET | Freq: Three times a day (TID) | ORAL | 0 refills | Status: AC | PRN

## 2023-09-18 MED ORDER — TRAMADOL HCL 50 MG PO TABS: 50.0000 mg | ORAL_TABLET | Freq: Three times a day (TID) | ORAL | 0 refills | Status: DC | PRN

## 2023-09-18 NOTE — Telephone Encounter (Signed)
 Copied from CRM [id number]. Topic: Clinical - Prescription Issue >> Sep 18, 2023  1:11 PM Tobias CROME wrote: Reason for CRM: Patient currently at pharmacy.  Pharmacy needing prescription that is a narcotic to be adjusted.   Patient currently at the pharmacy & is being very inpatient with pharmacy staff about prescription.   Requesting callback, [phone]

## 2023-09-18 NOTE — Telephone Encounter (Signed)
 The prescription for tramadol  needs to be adjusted for initial fill as she is opioid naive the system will not allow her to fill the way it is for the first 7 days. I pended what is requested for her to be able to get it, please sign.

## 2023-09-18 NOTE — Patient Instructions (Addendum)
 Broken Bones of the Spine (Spine Compression Fracture): What to Know  A spine compression fracture happens when one or more of the bones of the spine breaks (fractures) and collapses from trauma or weak bones. With this type of fracture, the spine is pushed into a wedge shape. It often happens in the middle or lower part of your spine. What are the causes? This type of fracture may be caused by: Osteoporosis. This condition causes bones to thin, lose density, and grow weak. It's the most common cause. A fall. A car or motorcycle accident. Cancer. Trauma, such as a heavy, direct hit to your head or back. What increases the risk? You're more likely to get this kind of fracture if: You're 80 years of age or older. You have osteoporosis. You have certain types of cancer. These include: Multiple myeloma. Lymphoma. Prostate cancer. Lung cancer. Breast cancer. You're female. You smoke or drink alcohol. What are the signs or symptoms? Very bad pain when you move, such as when you cough or sneeze. Pain that gets worse over time. Pain that's worse when you stand, walk, sit, or bend. Sudden pain that's so bad that it's hard for you to move. Bending or humping of the spine. Slow loss of height. Numbness, tingling, or weakness in your back and legs. Trouble walking. How is this diagnosed? You may be diagnosed based on your symptoms, medical history, and an exam. During the exam, your health care provider may tap along the length of your spine to check for tender spots.  Tests may also be done. These may include. A bone mineral density test. Imaging tests, such as: An X-ray of your spine. A CT scan. An MRI. How is this treated? Some fractures may heal on their own. If you need treatment, it may include: Pain medicine. Rest. A back brace. Physical therapy. Medicine to make your bones stronger. Calcium and vitamin D  supplements. In some cases, you may need surgery, such as if: Your  back becomes misshapen. You have nerve pain or weakness. Your pain doesn't get better with other treatment. The types of surgeries that may be done include: Vertebroplasty. This is when bone cement is put into the collapsed bones to help make them more stable. Balloon kyphoplasty. This is when the collapsed bones are expanded with a balloon. Then, bone cement is put into them. Spinal fusion. This is when the collapsed bones are connected (fused) to healthy bones in the spine. Follow these instructions at home: Medicines Take your medicines only as told. You may need to take steps to help treat or prevent trouble pooping (constipation), such as: Taking medicines to help you poop. Eating foods high in fiber, like beans, whole grains, and fresh fruits and vegetables. Drinking more fluids as told. Ask your provider if it's safe to drive or use machines while taking your medicine. If you have a brace: Wear the brace as told. Take it off only if your provider says you can. Keep the brace clean. Managing pain, stiffness, and swelling  Use ice or an ice pack as told. If you have a brace that you can take off, remove it only as told. Place a towel between your skin and the ice. Leave the ice on for 20 minutes, 2-3 times a day. If your skin turns red, take off the ice right away to prevent skin damage. The risk of damage is higher if you can't feel pain, heat, or cold. Activity Rest as told. Get up to take short  walks many times during the day. This helps you breathe better and keeps your blood flowing. Ask for help if you feel weak or unsteady. Exercise as told. You may need to do exercises to help with movement and strength in your back. Ask what things are safe for you to do at home. Ask when you can go back to work or school. General instructions Do not drink alcohol. Do not smoke, vape, or use nicotine or tobacco. Doing this can slow down healing. Keep all follow-up visits. This can help  to prevent long-lasting injury and pain. Contact a health care provider if: You have a fever. Your pain isn't getting better with medicine. Your pain doesn't get better over time. You can't go back to doing normal things as planned. Get help right away if: Your pain is very bad, and it gets worse all of a sudden. You can't move any body part that's below the level of your injury. You have numbness, tingling, or weakness in any body part below the level of your injury. You can't control when you pee or poop. This information is not intended to replace advice given to you by your health care provider. Make sure you discuss any questions you have with your health care provider. Document Revised: 03/06/2023 Document Reviewed: 03/06/2023 Elsevier Patient Education  2025 [employer].
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
## 2023-09-18 NOTE — Telephone Encounter (Signed)
 Prescription sent to pharmacy.

## 2023-09-19 ENCOUNTER — Ambulatory Visit: Payer: Self-pay | Admitting: Family

## 2023-09-19 LAB — TSH: TSH: 15.4 u[IU]/mL — ABNORMAL HIGH (ref 0.450–4.500)

## 2023-09-19 LAB — CMP14+EGFR
ALT: 10 IU/L (ref 0–32)
AST: 17 IU/L (ref 0–40)
Albumin: 3.9 g/dL (ref 3.8–4.8)
Alkaline Phosphatase: 84 IU/L (ref 44–121)
BUN/Creatinine Ratio: 12 (ref 12–28)
BUN: 13 mg/dL (ref 8–27)
Bilirubin Total: 0.3 mg/dL (ref 0.0–1.2)
CO2: 23 mmol/L (ref 20–29)
Calcium: 10.8 mg/dL — ABNORMAL HIGH (ref 8.7–10.3)
Chloride: 103 mmol/L (ref 96–106)
Creatinine, Ser: 1.08 mg/dL — ABNORMAL HIGH (ref 0.57–1.00)
Globulin, Total: 2.9 g/dL (ref 1.5–4.5)
Glucose: 108 mg/dL — ABNORMAL HIGH (ref 70–99)
Potassium: 4.1 mmol/L (ref 3.5–5.2)
Sodium: 141 mmol/L (ref 134–144)
Total Protein: 6.8 g/dL (ref 6.0–8.5)
eGFR: 52 mL/min/1.73 — ABNORMAL LOW (ref >59)

## 2023-09-19 MED ORDER — LEVOTHYROXINE SODIUM 88 MCG PO TABS: 88.0000 ug | ORAL_TABLET | Freq: Every day | ORAL | 3 refills | Status: AC

## 2023-09-21 ENCOUNTER — Other Ambulatory Visit: Payer: Self-pay | Admitting: Family

## 2023-09-21 DIAGNOSIS — E1142 Type 2 diabetes mellitus with diabetic polyneuropathy: Secondary | ICD-10-CM

## 2023-09-21 MED ORDER — TIRZEPATIDE 7.5 MG/0.5ML ~~LOC~~ SOAJ: 7.5000 mg | SUBCUTANEOUS | 2 refills | Status: DC

## 2023-09-21 NOTE — Telephone Encounter (Unsigned)
 Copied from CRM [id number]. Topic: Clinical - Medication Refill >> Sep 21, 2023  2:44 PM Santiya F wrote: Medication: tirzepatide  (MOUNJARO ) 7.5 MG/0.5ML Pen [[id number]]  Has the patient contacted their pharmacy? Yes  (Agent: If yes, when and what did the pharmacy advise?) Contact office   This is the patient's preferred pharmacy:  [facility] [address] [place], [place] - [address] AT [facility] [address] & [address] [place] [address] Phone: [phone] Fax: [phone]  Is this the correct pharmacy for this prescription? Yes If no, delete pharmacy and type the correct one.   Has the prescription been filled recently? Yes  Is the patient out of the medication? Yes  Has the patient been seen for an appointment in the last year OR does the patient have an upcoming appointment? Yes  Can we respond through MyChart? No  Agent: Please be advised that Rx refills may take up to 3 business days. We ask that you follow-up with your pharmacy.

## 2023-09-28 ENCOUNTER — Other Ambulatory Visit: Payer: Self-pay | Admitting: Family

## 2023-09-28 DIAGNOSIS — F03A0 Unspecified dementia, mild, without behavioral disturbance, psychotic disturbance, mood disturbance, and anxiety: Secondary | ICD-10-CM

## 2023-10-03 ENCOUNTER — Encounter: Payer: Self-pay | Admitting: Orthopedic Surgery

## 2023-10-03 ENCOUNTER — Ambulatory Visit: Admitting: Orthopedic Surgery

## 2023-10-03 VITALS — BP 119/77 | HR 84 | Ht 65.0 in | Wt 150.0 lb

## 2023-10-03 DIAGNOSIS — M545 Low back pain, unspecified: Secondary | ICD-10-CM | POA: Diagnosis not present

## 2023-10-03 DIAGNOSIS — G8929 Other chronic pain: Secondary | ICD-10-CM

## 2023-10-03 MED ORDER — TRAMADOL HCL 50 MG PO TABS: 50.0000 mg | ORAL_TABLET | Freq: Four times a day (QID) | ORAL | 0 refills | Status: DC | PRN

## 2023-10-03 NOTE — Progress Notes (Signed)
 New Patient Visit  Assessment: Denise Gardner is a 78 y.o. female with the following: 1. Chronic bilateral low back pain without sciatica  Plan: [name] has chronic low back pain.  She fell recently, and has acute worsening of her pain.  It has been bothering her for a while.  I was unable to review radiographs in clinic today, but her report was available.  Chronic appearing L1 and L2 vertebra, with loss of height.  Possible acute on chronic compression injuries.  She does have diffuse degenerative changes.  We discussed multiple treatment options, and she would like a back brace.  We provided a prescription for a corset type brace.  She can wear this as needed.  I have also provided her with a limited prescription for tramadol .  Continue with heating pad.  If she continues to have issues, I would likely refer her to a spine specialist.  She states understanding.  She will follow-up as needed.  Follow-up: Return if symptoms worsen or fail to improve.  Subjective:  Chief Complaint  Patient presents with   Back Pain    Has had back pain for years and years states a doctor ? Dr Daring told her she needs surgery back worse since she fell 2 months ago states she needs some relief but doesn't want surgery     History of Present Illness: Denise Gardner is a 78 y.o. female who has been referred by Bari Learn, FNP for evaluation of low back pain.  She has history of chronic low back pain.  This is been ongoing for years.  She states that she was told years ago that she would benefit from surgery.  She was not interested.  Recently, she fell.  This was over a month ago.  Since then, she has had a lot of back pain.  She walks with a cane.  She uses a heating pad to help with the pain when she is laying down.  She takes Tylenol  multiple times per day.  She denies radiating pains into her legs.   Review of Systems: No fevers or chills No numbness or tingling No chest pain No shortness of  breath No bowel or bladder dysfunction No GI distress No headaches   Medical History:  Past Medical History:  Diagnosis Date   Anxiety    depression   Asthma    Cataract    Chronic back pain    Coronary artery disease    Taxus stent to RCA 2008 2.5 x 16, non obstructive 15% proximal right coronary artery, patent curcumflex LAD, preserved LV   Diabetes mellitus without complication (HCC)    GERD (gastroesophageal reflux disease)    Hyperlipidemia    Hypertension    Insomnia    Migraine    Neuropathy    Shingles    Thyroid  disease    Ulcer, stomach peptic     Past Surgical History:  Procedure Laterality Date   TOTAL ABDOMINAL HYSTERECTOMY      Family History  Problem Relation Age of Onset   Stroke Mother 54   Heart disease Mother    Breast cancer Sister    Cancer Sister        breast   Heart disease Sister    Diabetes Sister    Heart disease Sister    Diabetes Sister    Heart disease Sister    Diabetes Sister    Heart disease Sister    Coronary artery disease Brother 2   Diabetes Brother  Heart disease Brother    Coronary artery disease Brother 4   Diabetes Brother    Heart disease Brother    Diabetes Brother    Heart disease Brother    Heart attack Brother    Heart disease Brother    Heart disease Brother    Heart disease Brother    Social History   Tobacco Use   Smoking status: Never   Smokeless tobacco: Never  Vaping Use   Vaping status: Never Used  Substance Use Topics   Alcohol use: No   Drug use: No    Allergies  Allergen Reactions   Milk-Related Compounds Itching and Nausea And Vomiting   Egg-Derived Products Itching, Nausea And Vomiting and Other (See Comments)    headaches   Buspar  [Buspirone ] Other (See Comments)    Causing nightmares    Milk (Cow) Other (See Comments)    Per PCP office   Other Other (See Comments)    Other reaction(s): Other (See Comments) Per [facility] Other reaction(s): Other (See  Comments) Per [facility] Per [facility]   Statins    Crestor [Rosuvastatin]     Myalgias    Ezetimibe Other (See Comments)    mylagia Per PCP office    Current Meds  Medication Sig   Accu-Chek FastClix Lancets MISC    albuterol  (PROVENTIL ) (2.5 MG/3ML) 0.083% nebulizer solution Take 3 mLs (2.5 mg total) by nebulization every 6 (six) hours as needed for wheezing or shortness of breath.   amLODipine  (NORVASC ) 10 MG tablet TAKE 1 TABLET(10 MG) BY MOUTH DAILY   aspirin  81 MG chewable tablet Chew 81 mg by mouth daily.   Biotin 5000 MCG TABS Take by mouth.   blood glucose meter kit and supplies Dispense based on patient and insurance preference. Use up to four times daily as directed. E11. Whatever insurance will cover   cetirizine  (ZYRTEC ) 10 MG tablet TAKE 1 TABLET(10 MG) BY MOUTH DAILY   diazepam  (VALIUM ) 2 MG tablet Take 1 tablet (2 mg total) by mouth every 6 (six) hours as needed for anxiety.   diclofenac  Sodium (VOLTAREN ) 1 % GEL Apply 2 g topically 4 (four) times daily.   donepezil  (ARICEPT ) 10 MG tablet TAKE 1 TABLET(10 MG) BY MOUTH AT BEDTIME   escitalopram  (LEXAPRO ) 10 MG tablet TAKE 1 TABLET(10 MG) BY MOUTH DAILY   Fe Bisgly-Vit C-Vit B12-FA (GENTLE IRON ) 28-60-0.008-0.4 MG CAPS Take 1 capsule by mouth daily.   fluticasone  (FLONASE ) 50 MCG/ACT nasal spray SHAKE LIQUID AND USE 2 SPRAYS IN EACH NOSTRIL DAILY   gabapentin  (NEURONTIN ) 300 MG capsule TAKE 1 CAPSULE BY MOUTH IN THE MORNING, 1 IN THE AFTERNOON, AND 4 CAPSULES BY MOUTH EVERY NIGHT AT BEDTIME   glipiZIDE  (GLUCOTROL  XL) 5 MG 24 hr tablet    glucose blood (ACCU-CHEK GUIDE TEST) test strip Check BS QID Dx E11.9   icosapent  Ethyl (VASCEPA ) 1 g capsule    ipratropium-albuterol  (DUONEB) 0.5-2.5 (3) MG/3ML SOLN USE 1 VIAL VIA NEBULIZER EVERY 6 HOURS   isosorbide  mononitrate (IMDUR ) 120 MG 24 hr tablet TAKE 1 TABLET(120 MG) BY MOUTH DAILY   JARDIANCE  25 MG TABS tablet TAKE 1 TABLET BY MOUTH DAILY BEFORE BREAKFAST    levothyroxine  (SYNTHROID ) 88 MCG tablet Take 1 tablet (88 mcg total) by mouth daily.   linaclotide  (LINZESS ) 145 MCG CAPS capsule Take 1 capsule (145 mcg total) by mouth daily before breakfast.   memantine  (NAMENDA ) 10 MG tablet TAKE 1 TABLET(10 MG) BY MOUTH TWICE  DAILY   metoprolol  tartrate (LOPRESSOR ) 25 MG tablet TAKE 1 TABLET(25 MG) BY MOUTH TWICE DAILY   mirabegron  ER (MYRBETRIQ ) 25 MG TB24 tablet Take 25 mg by mouth.   misoprostol  (CYTOTEC ) 200 MCG tablet TAKE 1 TABLET BY MOUTH FOUR TIMES DAILY   montelukast  (SINGULAIR ) 10 MG tablet TAKE 1 TABLET(10 MG) BY MOUTH AT BEDTIME   nitroGLYCERIN  (NITROLINGUAL ) 0.4 MG/SPRAY spray Place 1 spray under the tongue every 5 (five) minutes x 3 doses as needed for chest pain.   nitroGLYCERIN  (NITROSTAT ) 0.4 MG SL tablet SMARTSIG:1 Tablet(s) Sublingual   omeprazole  (PRILOSEC) 20 MG capsule TAKE 1 CAPSULE BY MOUTH TWICE DAILY BEFORE A MEAL   raloxifene  (EVISTA ) 60 MG tablet TAKE 1 TABLET(60 MG) BY MOUTH DAILY   REPATHA  SURECLICK 140 MG/ML SOAJ ADMINISTER 1 ML(140 MG) UNDER THE SKIN EVERY 14 DAYS   tirzepatide  (MOUNJARO ) 7.5 MG/0.5ML Pen Inject 7.5 mg into the skin once a week.   traMADol  (ULTRAM ) 50 MG tablet Take 1 tablet (50 mg total) by mouth every 6 (six) hours as needed.   traZODone  (DESYREL ) 100 MG tablet Take 1 tablet (100 mg total) by mouth at bedtime.   zinc gluconate 50 MG tablet Take 50 mg by mouth daily.    Objective: BP 119/77   Pulse 84   Ht 5' 5 (1.651 m)   Wt 150 lb (68 kg)   BMI 24.96 kg/m   Physical Exam:  General: Elderly female., Alert and oriented., No acute distress., and Seated in a wheelchair. Gait: Ambulates with the assistance of a cane  Tenderness palpation along the lower back.  Negative straight leg raise bilaterally.  She has good lower body strength, but this does bother her low back.  She has no pain in the groin with internal or external rotation.   IMAGING: I personally reviewed images previously obtained  in clinic  1. Incidental note of transitional anatomy. Please correlate closely with imaging studies if intervention is considered, so that there is appropriate and consistent counting of vertebral levels.  2. Age-indeterminate height loss of the L1 superior endplate and L2 superior and inferior endplates. These may reflect acute compression fractures in the setting of fall and pain.  3. Possible pars defects at L4.  4. Degenerative disc disease and facet arthrosis.     New Medications:  Meds ordered this encounter  Medications   traMADol  (ULTRAM ) 50 MG tablet    Sig: Take 1 tablet (50 mg total) by mouth every 6 (six) hours as needed.    Dispense:  30 tablet    Refill:  0      Oneil DELENA Horde, MD  10/03/2023 11:30 AM

## 2023-10-03 NOTE — Patient Instructions (Signed)
 If you continue to have issues, we may have to consider a referral to a spine specialist.  Medications as needed  You were provided with a prescription for a back brace, that you can purchase using the prescription.  Use this brace as needed.

## 2023-10-13 ENCOUNTER — Ambulatory Visit: Admitting: Family

## 2023-10-15 ENCOUNTER — Other Ambulatory Visit: Payer: Self-pay | Admitting: Family

## 2023-10-20 ENCOUNTER — Encounter: Payer: Self-pay | Admitting: Family

## 2023-10-20 ENCOUNTER — Ambulatory Visit: Admitting: Family

## 2023-10-20 VITALS — BP 128/73 | HR 78 | Temp 97.0°F | Ht 65.0 in | Wt 154.0 lb

## 2023-10-20 DIAGNOSIS — E1169 Type 2 diabetes mellitus with other specified complication: Secondary | ICD-10-CM | POA: Diagnosis not present

## 2023-10-20 DIAGNOSIS — G300 Alzheimer's disease with early onset: Secondary | ICD-10-CM

## 2023-10-20 DIAGNOSIS — N3281 Overactive bladder: Secondary | ICD-10-CM | POA: Diagnosis not present

## 2023-10-20 DIAGNOSIS — N1831 Chronic kidney disease, stage 3a: Secondary | ICD-10-CM

## 2023-10-20 DIAGNOSIS — R42 Dizziness and giddiness: Secondary | ICD-10-CM

## 2023-10-20 DIAGNOSIS — E1159 Type 2 diabetes mellitus with other circulatory complications: Secondary | ICD-10-CM

## 2023-10-20 DIAGNOSIS — I152 Hypertension secondary to endocrine disorders: Secondary | ICD-10-CM

## 2023-10-20 DIAGNOSIS — E785 Hyperlipidemia, unspecified: Secondary | ICD-10-CM | POA: Diagnosis not present

## 2023-10-20 DIAGNOSIS — E1142 Type 2 diabetes mellitus with diabetic polyneuropathy: Secondary | ICD-10-CM

## 2023-10-20 DIAGNOSIS — E039 Hypothyroidism, unspecified: Secondary | ICD-10-CM | POA: Diagnosis not present

## 2023-10-20 DIAGNOSIS — F3342 Major depressive disorder, recurrent, in full remission: Secondary | ICD-10-CM

## 2023-10-20 DIAGNOSIS — K5902 Outlet dysfunction constipation: Secondary | ICD-10-CM

## 2023-10-20 DIAGNOSIS — K219 Gastro-esophageal reflux disease without esophagitis: Secondary | ICD-10-CM | POA: Diagnosis not present

## 2023-10-20 DIAGNOSIS — F419 Anxiety disorder, unspecified: Secondary | ICD-10-CM

## 2023-10-20 DIAGNOSIS — I2510 Atherosclerotic heart disease of native coronary artery without angina pectoris: Secondary | ICD-10-CM | POA: Diagnosis not present

## 2023-10-20 LAB — BAYER DCA HB A1C WAIVED: HB A1C (BAYER DCA - WAIVED): 5.7 % — ABNORMAL HIGH (ref 4.8–5.6)

## 2023-10-20 MED ORDER — MECLIZINE HCL 25 MG PO TABS: 25.0000 mg | ORAL_TABLET | Freq: Three times a day (TID) | ORAL | 2 refills | Status: AC | PRN

## 2023-10-20 MED ORDER — LINACLOTIDE 290 MCG PO CAPS: 290.0000 ug | ORAL_CAPSULE | Freq: Every day | ORAL | 3 refills | Status: AC

## 2023-10-20 NOTE — Patient Instructions (Signed)
 Dizziness Dizziness is a common problem. It makes you feel unsteady or light-headed. You may feel like you're about to faint. Dizziness can lead to getting hurt if you stumble or fall. It's more common to feel dizzy if you're an older adult. Many things can cause you to feel dizzy. These include: Medicines. Dehydration. This is when there's not enough water in your body. Illness. Follow these instructions at home: Eating and drinking  Drink enough fluid to keep your pee (urine) pale yellow. This helps keep you from getting dehydrated. Try to drink more clear fluids, such as water. Do not drink alcohol. Try to limit how much caffeine you take in. Try to limit how much salt, also called sodium, you take in. Activity Try not to make quick movements. Stand up slowly from sitting in a chair. Steady yourself until you feel okay. In the morning, first sit up on the side of the bed. When you feel okay, hold onto something and slowly stand up. Do this until you know that your balance is okay. If you need to stand in one place for a long time, move your legs often. Tighten and relax the muscles in your legs while you're standing. Do not drive or use machines if you feel dizzy. Avoid bending down if you feel dizzy. Place items in your home so you can reach them without leaning over. Lifestyle Do not smoke, vape, or use products with nicotine or tobacco in them. If you need help quitting, talk with your health care provider. Try to lower your stress level. You can do this by using methods like yoga or meditation. Talk with your provider if you need help. General instructions Watch your dizziness for any changes. Take your medicines only as told by your provider. Talk with your provider if you think you're dizzy because of a medicine you're taking. Tell a friend or a family member that you're feeling dizzy. If they spot any changes in your behavior, have them call your provider. Contact a health care  provider if: Your dizziness doesn't go away, or you have new symptoms. Your dizziness gets worse. You feel like you may vomit. You have trouble hearing. You have a fever. You have neck pain or a stiff neck. You fall or get hurt. Get help right away if: You vomit each time you eat or drink. You have watery poop and can't eat or drink. You have trouble talking, walking, swallowing, or using your arms, hands, or legs. You feel very weak. You're bleeding. You're not thinking clearly, or you have trouble forming sentences. A friend or family member may spot this. Your vision changes, or you get a very bad headache. These symptoms may be an emergency. Call 911 right away. Do not wait to see if the symptoms will go away. Do not drive yourself to the hospital. This information is not intended to replace advice given to you by your health care provider. Make sure you discuss any questions you have with your health care provider. Document Revised: 11/17/2022 Document Reviewed: 03/31/2022 Elsevier Patient Education  2024 [employer].
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
## 2023-10-21 LAB — CMP14+EGFR
ALT: 11 IU/L (ref 0–32)
AST: 16 IU/L (ref 0–40)
Albumin: 3.6 g/dL — ABNORMAL LOW (ref 3.8–4.8)
Alkaline Phosphatase: 59 IU/L (ref 44–121)
BUN/Creatinine Ratio: 16 (ref 12–28)
BUN: 18 mg/dL (ref 8–27)
Bilirubin Total: 0.3 mg/dL (ref 0.0–1.2)
CO2: 25 mmol/L (ref 20–29)
Calcium: 9.5 mg/dL (ref 8.7–10.3)
Chloride: 103 mmol/L (ref 96–106)
Creatinine, Ser: 1.12 mg/dL — ABNORMAL HIGH (ref 0.57–1.00)
Globulin, Total: 2.5 g/dL (ref 1.5–4.5)
Glucose: 157 mg/dL — ABNORMAL HIGH (ref 70–99)
Potassium: 4.3 mmol/L (ref 3.5–5.2)
Sodium: 142 mmol/L (ref 134–144)
Total Protein: 6.1 g/dL (ref 6.0–8.5)
eGFR: 50 mL/min/1.73 — ABNORMAL LOW (ref >59)

## 2023-10-21 LAB — CBC WITH DIFFERENTIAL/PLATELET
Basophils Absolute: 0.1 x10E3/uL (ref 0.0–0.2)
Basos: 1 %
EOS (ABSOLUTE): 0.3 x10E3/uL (ref 0.0–0.4)
Eos: 4 %
Hematocrit: 41.4 % (ref 34.0–46.6)
Hemoglobin: 13.2 g/dL (ref 11.1–15.9)
Immature Grans (Abs): 0 x10E3/uL (ref 0.0–0.1)
Immature Granulocytes: 0 %
Lymphocytes Absolute: 2.4 x10E3/uL (ref 0.7–3.1)
Lymphs: 36 %
MCH: 30.4 pg (ref 26.6–33.0)
MCHC: 31.9 g/dL (ref 31.5–35.7)
MCV: 95 fL (ref 79–97)
Monocytes Absolute: 0.6 x10E3/uL (ref 0.1–0.9)
Monocytes: 9 %
Neutrophils Absolute: 3.4 x10E3/uL (ref 1.4–7.0)
Neutrophils: 50 %
Platelets: 249 x10E3/uL (ref 150–450)
RBC: 4.34 x10E6/uL (ref 3.77–5.28)
RDW: 14.5 % (ref 11.7–15.4)
WBC: 6.7 x10E3/uL (ref 3.4–10.8)

## 2023-10-22 ENCOUNTER — Other Ambulatory Visit: Payer: Self-pay | Admitting: Family

## 2023-10-22 DIAGNOSIS — E1142 Type 2 diabetes mellitus with diabetic polyneuropathy: Secondary | ICD-10-CM

## 2023-10-23 ENCOUNTER — Other Ambulatory Visit: Payer: Self-pay | Admitting: Family

## 2023-10-23 ENCOUNTER — Ambulatory Visit: Payer: Self-pay | Admitting: Family

## 2023-10-23 DIAGNOSIS — E1142 Type 2 diabetes mellitus with diabetic polyneuropathy: Secondary | ICD-10-CM

## 2023-10-23 MED ORDER — TIRZEPATIDE 5 MG/0.5ML ~~LOC~~ SOAJ: 5.0000 mg | SUBCUTANEOUS | 3 refills | Status: AC

## 2023-10-23 NOTE — Telephone Encounter (Signed)
 Copied from CRM [id number]. Topic: Clinical - Medication Refill >> Oct 23, 2023  2:25 PM Carla L wrote: Medication: gabapentin  (NEURONTIN ) 300 MG capsule  Has the patient contacted their pharmacy? Yes Told to contact the office.  This is the patient's preferred pharmacy:  [facility] [address] [place], [place] - [address] AT [facility] [address] [place] [address] Phone: [phone] Fax: [phone]  Is this the correct pharmacy for this prescription? Yes   Has the prescription been filled recently? No  Is the patient out of the medication? No  Has the patient been seen for an appointment in the last year OR does the patient have an upcoming appointment? Yes  Can we respond through MyChart? No  Agent: Please be advised that Rx refills may take up to 3 business days. We ask that you follow-up with your pharmacy.

## 2023-11-09 ENCOUNTER — Other Ambulatory Visit: Payer: Self-pay | Admitting: Family

## 2023-11-11 ENCOUNTER — Other Ambulatory Visit: Payer: Self-pay | Admitting: Family

## 2023-11-11 DIAGNOSIS — I152 Hypertension secondary to endocrine disorders: Secondary | ICD-10-CM

## 2023-11-20 ENCOUNTER — Ambulatory Visit

## 2023-11-23 ENCOUNTER — Other Ambulatory Visit: Payer: Self-pay | Admitting: Family

## 2023-11-25 ENCOUNTER — Other Ambulatory Visit: Payer: Self-pay | Admitting: Family

## 2023-11-25 DIAGNOSIS — M8589 Other specified disorders of bone density and structure, multiple sites: Secondary | ICD-10-CM

## 2023-11-27 ENCOUNTER — Ambulatory Visit (INDEPENDENT_AMBULATORY_CARE_PROVIDER_SITE_OTHER)

## 2023-11-27 VITALS — BP 128/73 | HR 78 | Ht 65.0 in | Wt 154.0 lb

## 2023-11-27 DIAGNOSIS — Z0000 Encounter for general adult medical examination without abnormal findings: Secondary | ICD-10-CM
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
## 2023-11-27 NOTE — Patient Instructions (Signed)
 Denise Gardner,  Thank you for taking the time for your Medicare Wellness Visit. I appreciate your continued commitment to your health goals. Please review the care plan we discussed, and feel free to reach out if I can assist you further.  Medicare recommends these wellness visits once per year to help you and your care team stay ahead of potential health issues. These visits are designed to focus on prevention, allowing your provider to concentrate on managing your acute and chronic conditions during your regular appointments.  Please note that Annual Wellness Visits do not include a physical exam. Some assessments may be limited, especially if the visit was conducted virtually. If needed, we may recommend a separate in-person follow-up with your provider.  Ongoing Care Seeing your primary care provider every 3 to 6 months helps us  monitor your health and provide consistent, personalized care.   Referrals If a referral was made during today's visit and you haven't received any updates within two weeks, please contact the referred provider directly to check on the status.  Recommended Screenings:  Health Maintenance  Topic Date Due   Eye exam for diabetics  06/09/2022   Medicare Annual Wellness Visit  07/04/2023   COVID-19 Vaccine (3 - 2025-26 season) 10/30/2023   Flu Shot  05/28/2024*   Yearly kidney health urinalysis for diabetes  02/27/2024   Complete foot exam   02/27/2024   Hemoglobin A1C  04/21/2024   Breast Cancer Screening  08/22/2024   Yearly kidney function blood test for diabetes  10/19/2024   DEXA scan (bone density measurement)  07/13/2025   DTaP/Tdap/Td vaccine (3 - Td or Tdap) 11/02/2032   Pneumococcal Vaccine for age over 13  Completed   Zoster (Shingles) Vaccine  Completed   HPV Vaccine  Aged Out   Meningitis B Vaccine  Aged Out   Colon Cancer Screening  Discontinued   Hepatitis C Screening  Discontinued  *Topic was postponed. The date shown is not the original due  date.       11/27/2023    2:35 PM  Advanced Directives  Does Patient Have a Medical Advance Directive? No   Advance Care Planning is important because it: Ensures you receive medical care that aligns with your values, goals, and preferences. Provides guidance to your family and loved ones, reducing the emotional burden of decision-making during critical moments.  Vision: Annual vision screenings are recommended for early detection of glaucoma, cataracts, and diabetic retinopathy. These exams can also reveal signs of chronic conditions such as diabetes and high blood pressure.  Dental: Annual dental screenings help detect early signs of oral cancer, gum disease, and other conditions linked to overall health, including heart disease and diabetes.  Please see the attached documents for additional preventive care recommendations.

## 2023-12-04 ENCOUNTER — Other Ambulatory Visit: Payer: Self-pay | Admitting: Family

## 2023-12-04 DIAGNOSIS — E1142 Type 2 diabetes mellitus with diabetic polyneuropathy: Secondary | ICD-10-CM

## 2023-12-11 ENCOUNTER — Other Ambulatory Visit: Payer: Self-pay | Admitting: Family

## 2023-12-11 DIAGNOSIS — F419 Anxiety disorder, unspecified: Secondary | ICD-10-CM

## 2023-12-11 DIAGNOSIS — K219 Gastro-esophageal reflux disease without esophagitis: Secondary | ICD-10-CM

## 2023-12-11 DIAGNOSIS — F3342 Major depressive disorder, recurrent, in full remission: Secondary | ICD-10-CM

## 2023-12-12 ENCOUNTER — Other Ambulatory Visit: Payer: Self-pay | Admitting: Family

## 2023-12-12 DIAGNOSIS — E1159 Type 2 diabetes mellitus with other circulatory complications: Secondary | ICD-10-CM

## 2023-12-19 ENCOUNTER — Other Ambulatory Visit (HOSPITAL_COMMUNITY): Payer: Self-pay

## 2023-12-27 ENCOUNTER — Encounter: Payer: Self-pay | Admitting: Physician Assistant

## 2023-12-28 ENCOUNTER — Other Ambulatory Visit: Payer: Self-pay | Admitting: Family

## 2023-12-28 DIAGNOSIS — E1142 Type 2 diabetes mellitus with diabetic polyneuropathy: Secondary | ICD-10-CM

## 2023-12-31 ENCOUNTER — Other Ambulatory Visit: Payer: Self-pay | Admitting: *Deleted

## 2023-12-31 DIAGNOSIS — F03A0 Unspecified dementia, mild, without behavioral disturbance, psychotic disturbance, mood disturbance, and anxiety: Secondary | ICD-10-CM

## 2024-01-12 ENCOUNTER — Other Ambulatory Visit: Payer: Self-pay | Admitting: *Deleted

## 2024-01-12 DIAGNOSIS — E1142 Type 2 diabetes mellitus with diabetic polyneuropathy: Secondary | ICD-10-CM

## 2024-01-15 ENCOUNTER — Other Ambulatory Visit: Payer: Self-pay

## 2024-01-15 ENCOUNTER — Other Ambulatory Visit: Payer: Self-pay | Admitting: Family

## 2024-01-15 ENCOUNTER — Encounter: Payer: Self-pay | Admitting: Physician Assistant

## 2024-01-15 DIAGNOSIS — E1142 Type 2 diabetes mellitus with diabetic polyneuropathy: Secondary | ICD-10-CM

## 2024-01-15 NOTE — Telephone Encounter (Unsigned)
 Copied from CRM #[id number]. Topic: Clinical - Medication Refill >> Jan 15, 2024  9:38 AM Diannia H wrote: Medication:  gabapentin  (NEURONTIN ) 300 MG capsule   Has the patient contacted their pharmacy? Yes (Agent: If no, request that the patient contact the pharmacy for the refill. If patient does not wish to contact the pharmacy document the reason why and proceed with request.) (Agent: If yes, when and what did the pharmacy advise?)  This is the patient's preferred pharmacy:  [facility] [address] [place], [place] - [address] AT [facility] [address] & [address] [place] [address] Phone: [phone] Fax: [phone]  Is this the correct pharmacy for this prescription? Yes If no, delete pharmacy and type the correct one.   Has the prescription been filled recently? No  Is the patient out of the medication? No  Has the patient been seen for an appointment in the last year OR does the patient have an upcoming appointment? Yes  Can we respond through MyChart? Yes  Agent: Please be advised that Rx refills may take up to 3 business days. We ask that you follow-up with your pharmacy.

## 2024-01-19 ENCOUNTER — Encounter: Payer: Self-pay | Admitting: Physician Assistant

## 2024-01-19 ENCOUNTER — Other Ambulatory Visit: Payer: Self-pay | Admitting: Family

## 2024-01-19 ENCOUNTER — Telehealth: Payer: Self-pay

## 2024-01-19 ENCOUNTER — Other Ambulatory Visit (HOSPITAL_COMMUNITY): Payer: Self-pay

## 2024-01-19 NOTE — Telephone Encounter (Signed)
 Pharmacy Patient Advocate Encounter   Received notification from Onbase that prior authorization for Repatha  SureClick 140MG /ML auto-injectors is required/requested.   Insurance verification completed.   The patient is insured through [facility].   Per test claim: PA required; PA submitted to above mentioned insurance via Latent Key/confirmation #/[id number] [facility] Status is pending

## 2024-01-19 NOTE — Telephone Encounter (Signed)
 Pharmacy Patient Advocate Encounter  Received notification from [employer] that Prior Authorization for Repatha  SureClick 140MG /ML auto-injectors has been APPROVED from 01/19/24 to 02/27/25   PA #/Case ID/Reference #: [id number]

## 2024-01-22 ENCOUNTER — Other Ambulatory Visit: Payer: Self-pay | Admitting: Family

## 2024-01-22 ENCOUNTER — Encounter: Payer: Self-pay | Admitting: Family

## 2024-01-22 ENCOUNTER — Ambulatory Visit: Payer: Self-pay | Admitting: Family

## 2024-01-22 VITALS — BP 103/66 | HR 65 | Temp 97.9°F | Ht 65.0 in | Wt 161.6 lb

## 2024-01-22 DIAGNOSIS — F419 Anxiety disorder, unspecified: Secondary | ICD-10-CM | POA: Diagnosis not present

## 2024-01-22 DIAGNOSIS — Z7984 Long term (current) use of oral hypoglycemic drugs: Secondary | ICD-10-CM

## 2024-01-22 DIAGNOSIS — E1159 Type 2 diabetes mellitus with other circulatory complications: Secondary | ICD-10-CM

## 2024-01-22 DIAGNOSIS — H6121 Impacted cerumen, right ear: Secondary | ICD-10-CM

## 2024-01-22 DIAGNOSIS — N1831 Chronic kidney disease, stage 3a: Secondary | ICD-10-CM

## 2024-01-22 DIAGNOSIS — F3342 Major depressive disorder, recurrent, in full remission: Secondary | ICD-10-CM

## 2024-01-22 DIAGNOSIS — F03A0 Unspecified dementia, mild, without behavioral disturbance, psychotic disturbance, mood disturbance, and anxiety: Secondary | ICD-10-CM

## 2024-01-22 DIAGNOSIS — J301 Allergic rhinitis due to pollen: Secondary | ICD-10-CM

## 2024-01-22 DIAGNOSIS — J4520 Mild intermittent asthma, uncomplicated: Secondary | ICD-10-CM

## 2024-01-22 DIAGNOSIS — F322 Major depressive disorder, single episode, severe without psychotic features: Secondary | ICD-10-CM

## 2024-01-22 DIAGNOSIS — I2510 Atherosclerotic heart disease of native coronary artery without angina pectoris: Secondary | ICD-10-CM

## 2024-01-22 DIAGNOSIS — E1142 Type 2 diabetes mellitus with diabetic polyneuropathy: Secondary | ICD-10-CM | POA: Diagnosis not present

## 2024-01-22 DIAGNOSIS — K219 Gastro-esophageal reflux disease without esophagitis: Secondary | ICD-10-CM

## 2024-01-22 DIAGNOSIS — M8589 Other specified disorders of bone density and structure, multiple sites: Secondary | ICD-10-CM

## 2024-01-22 DIAGNOSIS — J449 Chronic obstructive pulmonary disease, unspecified: Secondary | ICD-10-CM

## 2024-01-22 DIAGNOSIS — I152 Hypertension secondary to endocrine disorders: Secondary | ICD-10-CM

## 2024-01-22 MED ORDER — DIAZEPAM 2 MG PO TABS: 2.0000 mg | ORAL_TABLET | Freq: Four times a day (QID) | ORAL | 1 refills | Status: AC | PRN

## 2024-01-22 MED ORDER — FLUTICASONE PROPIONATE 50 MCG/ACT NA SUSP: NASAL | 1 refills | Status: AC

## 2024-01-22 MED ORDER — MEMANTINE HCL 10 MG PO TABS: ORAL_TABLET | ORAL | 0 refills | Status: AC

## 2024-01-22 MED ORDER — IPRATROPIUM-ALBUTEROL 0.5-2.5 (3) MG/3ML IN SOLN
RESPIRATORY_TRACT | 0 refills | Status: DC
Start: 2024-01-22 — End: 2024-01-23

## 2024-01-22 MED ORDER — METOPROLOL TARTRATE 25 MG PO TABS: ORAL_TABLET | ORAL | 0 refills | Status: AC

## 2024-01-22 MED ORDER — RALOXIFENE HCL 60 MG PO TABS: ORAL_TABLET | ORAL | 0 refills | Status: AC

## 2024-01-22 MED ORDER — ISOSORBIDE MONONITRATE ER 120 MG PO TB24: ORAL_TABLET | ORAL | 0 refills | Status: AC

## 2024-01-22 MED ORDER — AMLODIPINE BESYLATE 10 MG PO TABS: ORAL_TABLET | ORAL | 0 refills | Status: DC

## 2024-01-22 MED ORDER — ALBUTEROL SULFATE (2.5 MG/3ML) 0.083% IN NEBU: 2.5000 mg | INHALATION_SOLUTION | Freq: Four times a day (QID) | RESPIRATORY_TRACT | 1 refills | Status: DC | PRN

## 2024-01-22 MED ORDER — ESCITALOPRAM OXALATE 10 MG PO TABS: ORAL_TABLET | ORAL | 0 refills | Status: AC

## 2024-01-22 MED ORDER — BACLOFEN 10 MG PO TABS: 10.0000 mg | ORAL_TABLET | Freq: Three times a day (TID) | ORAL | 2 refills | Status: AC | PRN

## 2024-01-22 MED ORDER — EMPAGLIFLOZIN 25 MG PO TABS: 25.0000 mg | ORAL_TABLET | Freq: Every day | ORAL | 0 refills | Status: AC

## 2024-01-22 MED ORDER — OMEPRAZOLE 20 MG PO CPDR: DELAYED_RELEASE_CAPSULE | ORAL | 0 refills | Status: AC

## 2024-01-22 NOTE — Patient Instructions (Signed)
 Hypotension As the heart beats, it forces blood through the body. Hypotension, commonly called low blood pressure, is when the force of blood pumping through the arteries is too weak. Arteries are blood vessels that carry blood from the heart throughout the body. Depending on the cause and severity, hypotension may be harmless (benign) or may cause serious problems (be critical). When your blood pressure is too low, you may not get enough blood to your brain or to the rest of your organs. This can cause weakness, light-headedness, a rapid heartbeat, and fainting. What are the causes? This condition may be caused by: Blood loss. Loss of body fluids (dehydration). Heart problems. Hormone (endocrine) problems. Pregnancy. Severe infection. Lack of certain nutrients. Severe allergic reactions (anaphylaxis). Certain medicines, such as blood pressure medicine or medicines that make the body lose excess fluids (diuretics). Sometimes, hypotension may be caused by not taking medicine as directed, such as taking too much of a certain medicine. What increases the risk? The following factors may make you more likely to develop this condition: Age. Risk increases as you get older. Having a condition that affects the heart or the central nervous system. What are the signs or symptoms? Common symptoms of this condition include: Weakness. Light-headedness. Dizziness. Blurred vision. Tiredness (fatigue). Rapid heartbeat. Fainting, in severe cases. How is this diagnosed? This condition is diagnosed based on: Your medical history. Your symptoms. Your blood pressure measurement. Your health care provider will check your blood pressure when you are: Lying down. Sitting. Standing. A blood pressure reading is recorded as two numbers, such as "120 over 80" (or 120/80). The first ("top") number is called the systolic pressure. It is a measure of the pressure in your arteries as your heart beats. The second  ("bottom") number is called the diastolic pressure. It is a measure of the pressure in your arteries when your heart relaxes between beats. Blood pressure is measured in a unit called mm Hg. Healthy blood pressure for most adults is 120/80. If your blood pressure is below 90/60, you may be diagnosed with hypotension. Other information or tests that may be used to diagnose hypotension include: Your other vital signs, such as your heart rate and temperature. Blood tests. Tilt table test. For this test, you will be safely secured to a table that moves you from a lying position to an upright position. Your heart rhythm and blood pressure will be monitored during the test. How is this treated? Treatment for this condition may include: Changing your diet. This may involve drinking more water or increasing your salt (sodium) intake with high-sodium foods. Taking medicines to raise your blood pressure. Changing the dosage of certain medicines you are taking that might be lowering your blood pressure. Wearing compression stockings. These stockings help to prevent blood clots and reduce swelling in your legs. In some cases, you may need to go to the hospital for: Fluid replacement. This means you will receive fluids through an IV. Blood replacement. This means you will receive donated blood through an IV (transfusion). Treating an infection or heart problems, if this applies. Monitoring. You may need to be monitored while medicines that you are taking wear off. Follow these instructions at home: Eating and drinking  Drink enough fluid to keep your urine pale yellow. Eat a healthy diet, and follow instructions from your health care provider about eating or drinking restrictions. A healthy diet includes: Fresh fruits and vegetables. Whole grains. Lean meats. Low-fat dairy products. Increase your salt intake if told  to do so. Do not add extra salt to your diet unless your health care provider tells you  to do that. Eat frequent, small meals. Avoid standing up suddenly after eating. Medicines Take over-the-counter and prescription medicines only as told by your health care provider. Follow instructions from your health care provider about changing the dosage of your current medicines, if this applies. Do not stop or adjust any of your medicines on your own. General instructions  Wear compression stockings as told by your health care provider. Get up slowly from lying down or sitting positions. This gives your blood pressure a chance to adjust. Avoid hot showers and excessive heat as directed by your health care provider. Return to your normal activities as told by your health care provider. Ask your health care provider what activities are safe for you. Do not use any products that contain nicotine or tobacco. These products include cigarettes, chewing tobacco, and vaping devices, such as e-cigarettes. If you need help quitting, ask your health care provider. Keep all follow-up visits. This is important. Contact a health care provider if: You vomit. You have diarrhea. You have a fever for more than 2-3 days. You feel more thirsty than usual. You feel weak and tired. Get help right away if: You have chest pain. You have a fast or irregular heartbeat. You develop numbness in any part of your body. You cannot move your arms or your legs. You have trouble speaking. You become sweaty or feel light-headed. You faint. You feel short of breath. You have trouble staying awake. You feel confused. These symptoms may be an emergency. Get help right away. Call 911. Do not wait to see if the symptoms will go away. Do not drive yourself to the hospital. Summary Hypotension is when the force of blood pumping through the arteries is too weak. Hypotension may be harmless (benign) or may cause serious problems (be critical). Treatment for this condition may include changing your diet, changing  your medicines, and wearing compression stockings. In some cases, you may need to go to the hospital for fluid or blood replacement. This information is not intended to replace advice given to you by your health care provider. Make sure you discuss any questions you have with your health care provider. Document Revised: 10/05/2020 Document Reviewed: 10/05/2020 Elsevier Patient Education  2024 [employer].
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
## 2024-01-23 ENCOUNTER — Other Ambulatory Visit

## 2024-01-23 LAB — CMP14+EGFR
ALT: 24 IU/L (ref 0–32)
AST: 16 IU/L (ref 0–40)
Albumin: 3.7 g/dL — ABNORMAL LOW (ref 3.8–4.8)
Alkaline Phosphatase: 54 IU/L (ref 49–135)
BUN/Creatinine Ratio: 21 (ref 12–28)
BUN: 22 mg/dL (ref 8–27)
Bilirubin Total: 0.3 mg/dL (ref 0.0–1.2)
CO2: 28 mmol/L (ref 20–29)
Calcium: 9.8 mg/dL (ref 8.7–10.3)
Chloride: 101 mmol/L (ref 96–106)
Creatinine, Ser: 1.07 mg/dL — ABNORMAL HIGH (ref 0.57–1.00)
Globulin, Total: 2.6 g/dL (ref 1.5–4.5)
Glucose: 113 mg/dL — ABNORMAL HIGH (ref 70–99)
Potassium: 4 mmol/L (ref 3.5–5.2)
Sodium: 143 mmol/L (ref 134–144)
Total Protein: 6.3 g/dL (ref 6.0–8.5)
eGFR: 53 mL/min/1.73 — ABNORMAL LOW (ref >59)

## 2024-01-23 LAB — BAYER DCA HB A1C WAIVED: HB A1C (BAYER DCA - WAIVED): 5.9 % — ABNORMAL HIGH (ref 4.8–5.6)

## 2024-01-24 ENCOUNTER — Telehealth: Payer: Self-pay | Admitting: Family

## 2024-01-24 NOTE — Telephone Encounter (Signed)
 Waiting on provider to review results.   Copied from CRM #[id number]. Topic: Clinical - Lab/Test Results >> Jan 24, 2024  1:36 PM Denise Gardner wrote: Reason for CRM: patient called in regarding lab results. Please call.

## 2024-01-29 ENCOUNTER — Ambulatory Visit: Payer: Self-pay | Admitting: Family

## 2024-02-11 ENCOUNTER — Other Ambulatory Visit: Payer: Self-pay | Admitting: Family

## 2024-02-11 DIAGNOSIS — E1159 Type 2 diabetes mellitus with other circulatory complications: Secondary | ICD-10-CM

## 2024-02-13 ENCOUNTER — Encounter: Payer: Self-pay | Admitting: Physician Assistant

## 2024-02-19 ENCOUNTER — Ambulatory Visit: Payer: Self-pay

## 2024-02-19 NOTE — Telephone Encounter (Signed)
 Appt made.

## 2024-02-19 NOTE — Telephone Encounter (Signed)
" °  FYI Only or Action Required?: Action required by provider: update on patient condition.  Patient was last seen in primary care on 01/22/2024 by Lavell Bari LABOR, FNP.  Called Nurse Triage reporting Leg Pain.  Symptoms began several days ago.  Interventions attempted: OTC medications:   and Rest, hydration, or home remedies.  Symptoms are: gradually worsening.  Triage Disposition: See HCP Within 4 Hours (Or PCP Triage)  Patient/caregiver understands and will follow disposition?: yes   Copied from CRM #[id number]. Topic: Clinical - Red Word Triage >> Feb 19, 2024 10:17 AM Zane F wrote: [facility] that prompted transfer to Nurse Triage:   Concern: Knot on middle of left thigh  Symptoms:   Left leg pain ( described as a lot of pain)   When did the symptoms start?: 1 week or two  What have you done to aid in the concern ? Have you taken anything to assist with the matter?: Yes   If so, what did you take?: Tylenol   Wanted to let you know I will be transferring you to further discuss your concern. Please be advised the nurse can assist with scheduling. >> Feb 19, 2024 10:40 AM Zane F wrote: Congestion in throat and chest   Callback at [phone] before 1pm EST Patient is at her sister house and this is her sister's home phone. Reason for Disposition  [1] Thigh or calf pain AND [2] only 1 side AND [3] present > 1 hour  (Exception: Chronic unchanged pain.)  Answer Assessment - Initial Assessment Questions 1. ONSET: When did the pain start?      Few days ago 2. LOCATION: Where is the pain located?      Left leg 3. PAIN: How bad is the pain?    (Scale 1-10; or mild, moderate, severe)     8/10  4. WORK OR EXERCISE: Has there been any recent work or exercise that involved this part of the body?      no 5. CAUSE: What do you think is causing the leg pain?     unknown 6. OTHER SYMPTOMS: Do you have any other symptoms? (e.g., chest pain, back pain, breathing  difficulty, swelling, rash, fever, numbness, weakness)     Thumb nail size knot in inner left thigh, no redness/ swelling  Protocols used: Leg Pain-A-AH  "

## 2024-02-20 ENCOUNTER — Encounter: Payer: Self-pay | Admitting: Family

## 2024-02-20 ENCOUNTER — Ambulatory Visit: Admitting: Family

## 2024-02-20 VITALS — BP 104/60 | HR 83 | Temp 97.4°F | Ht 65.0 in | Wt 162.0 lb

## 2024-02-20 DIAGNOSIS — I152 Hypertension secondary to endocrine disorders: Secondary | ICD-10-CM

## 2024-02-20 DIAGNOSIS — J410 Simple chronic bronchitis: Secondary | ICD-10-CM | POA: Diagnosis not present

## 2024-02-20 DIAGNOSIS — E1159 Type 2 diabetes mellitus with other circulatory complications: Secondary | ICD-10-CM

## 2024-02-20 MED ORDER — ALBUTEROL SULFATE (2.5 MG/3ML) 0.083% IN NEBU: 2.5000 mg | INHALATION_SOLUTION | Freq: Four times a day (QID) | RESPIRATORY_TRACT | 1 refills | Status: AC | PRN

## 2024-02-20 MED ORDER — BENZONATATE 200 MG PO CAPS: 200.0000 mg | ORAL_CAPSULE | Freq: Two times a day (BID) | ORAL | 0 refills | Status: AC | PRN

## 2024-02-20 MED ORDER — BREZTRI AEROSPHERE 160-9-4.8 MCG/ACT IN AERO: 2.0000 | INHALATION_SPRAY | Freq: Two times a day (BID) | RESPIRATORY_TRACT | 11 refills | Status: AC
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
## 2024-02-20 NOTE — Patient Instructions (Signed)
 Chronic Obstructive Pulmonary Disease Exacerbation  Chronic obstructive pulmonary disease (COPD) is a long-term (chronic) lung problem. When you have COPD, it can feel harder to breathe in or out. COPD exacerbation is a flare-up of symptoms when breathing gets worse and more treatment may be needed. Without treatment, flare-ups can be life-threatening. If they happen often, your lungs can become more damaged. What are the causes? Not taking your usual COPD medicines as told by your health care provider. A cold or the flu, which can cause infection in your lungs. Being exposed to things that make your breathing worse, such as: Smoke. Air pollution. Fumes. Dust. Allergies. Weather changes. What are the signs or symptoms? Symptoms do not get better or get worse even if you take your medicines as told by your provider. Symptoms may include: More shortness of breath. You may only be able to speak one or two words at a time. More coughing or mucus from your lungs. More wheezing or chest tightness. Being more tired and having less energy. Confusion. How is this diagnosed? This condition is diagnosed based on: Symptoms that get worse. Your medical history. A physical exam. You may also have tests, including: A chest X-ray. Blood or mucus tests. How is this treated? You may be able to stay home or you may need to go to the hospital. Treatment may include: Taking medicines. These may include: Inhalers. These have medicines in them that you breathe in. These may be more of what you already take or they may be new. Steroids. These reduce inflammation in the airways. These may be inhaled, taken by mouth, or given in an IV. Antibiotics. These treat infection. Using oxygen. Using a device to help you clear mucus. Follow these instructions at home: Medicines Take your medicines only as told by your provider. If you were given antibiotics or steroids, take them as told by your provider. Do  not stop taking them even if you start to feel better. Lifestyle Several times a day, wash your hands with soap and water for at least 20 seconds. If you cannot use soap and water, use hand sanitizer. This may help keep you from getting an infection. Avoid being around crowds or people who are sick. Do not smoke or use any products that contain nicotine or tobacco. If you need help quitting, ask your provider. Return to your normal activities when your provider says that it's safe. Use breathing methods to control your stress and catch your breath. How is this prevented? Follow your COPD action plan. The action plan tells you what to do if you're feeling good and what to do when you start feeling worse. Discuss the plan often with your provider. Make sure you get all the shots, also called vaccines, that your provider recommends. Ask your provider about a flu shot and a pneumonia shot. Use oxygen therapy if told by your provider. If you need home oxygen therapy, ask your provider how often to check your oxygen level with a device called an oximeter. Keep all follow-up visits to review your COPD action plan. Your provider will want to check on your condition often to keep you healthy and out of the hospital. Contact a health care provider if: Your COPD symptoms get worse. You have a fever or chills. You have trouble doing daily activities. You have trouble breathing even when you are resting. Get help right away if: You are short of breath and cannot: Talk in full sentences. Do normal activities. You have chest  pain. You feel confused. These symptoms may be an emergency. Call 911 right away. Do not wait to see if the symptoms will go away. Do not drive yourself to the hospital. This information is not intended to replace advice given to you by your health care provider. Make sure you discuss any questions you have with your health care provider. Document Revised: 11/17/2022 Document  Reviewed: 05/02/2022 Elsevier Patient Education  2024 [employer].

## 2024-02-21 LAB — CMP14+EGFR
ALT: 16 IU/L (ref 0–32)
AST: 17 IU/L (ref 0–40)
Albumin: 3.6 g/dL — ABNORMAL LOW (ref 3.8–4.8)
Alkaline Phosphatase: 53 IU/L (ref 49–135)
BUN/Creatinine Ratio: 16 (ref 12–28)
BUN: 19 mg/dL (ref 8–27)
Bilirubin Total: 0.3 mg/dL (ref 0.0–1.2)
CO2: 28 mmol/L (ref 20–29)
Calcium: 9.7 mg/dL (ref 8.7–10.3)
Chloride: 100 mmol/L (ref 96–106)
Creatinine, Ser: 1.16 mg/dL — ABNORMAL HIGH (ref 0.57–1.00)
Globulin, Total: 2.7 g/dL (ref 1.5–4.5)
Glucose: 109 mg/dL — ABNORMAL HIGH (ref 70–99)
Potassium: 4.3 mmol/L (ref 3.5–5.2)
Sodium: 142 mmol/L (ref 134–144)
Total Protein: 6.3 g/dL (ref 6.0–8.5)
eGFR: 48 mL/min/1.73 — ABNORMAL LOW

## 2024-02-23 ENCOUNTER — Ambulatory Visit: Payer: Self-pay | Admitting: Family

## 2024-02-23 ENCOUNTER — Other Ambulatory Visit: Payer: Self-pay | Admitting: Family

## 2024-04-03 ENCOUNTER — Other Ambulatory Visit: Payer: Self-pay | Admitting: Family

## 2024-04-03 DIAGNOSIS — G4700 Insomnia, unspecified: Secondary | ICD-10-CM

## 2024-04-22 ENCOUNTER — Ambulatory Visit: Admitting: Family

## 2024-07-31 ENCOUNTER — Ambulatory Visit: Admitting: Physician Assistant

## 2024-11-27 ENCOUNTER — Ambulatory Visit: Payer: Self-pay

## 2024-12-03 ENCOUNTER — Ambulatory Visit
